# Patient Record
Sex: Male | Born: 1935 | Race: White | Hispanic: No | Smoking: Former smoker
Health system: Southern US, Community
[De-identification: ages and names within clinical notes are randomized; demographics above are authoritative.]

## PROBLEM LIST (undated history)

## (undated) DIAGNOSIS — E785 Hyperlipidemia, unspecified: Secondary | ICD-10-CM

## (undated) DIAGNOSIS — I714 Abdominal aortic aneurysm, without rupture, unspecified: Secondary | ICD-10-CM

## (undated) DIAGNOSIS — I509 Heart failure, unspecified: Secondary | ICD-10-CM

## (undated) DIAGNOSIS — F1910 Other psychoactive substance abuse, uncomplicated: Secondary | ICD-10-CM

## (undated) DIAGNOSIS — E119 Type 2 diabetes mellitus without complications: Secondary | ICD-10-CM

## (undated) DIAGNOSIS — I219 Acute myocardial infarction, unspecified: Secondary | ICD-10-CM

## (undated) DIAGNOSIS — Z8719 Personal history of other diseases of the digestive system: Secondary | ICD-10-CM

## (undated) DIAGNOSIS — J449 Chronic obstructive pulmonary disease, unspecified: Secondary | ICD-10-CM

## (undated) DIAGNOSIS — I2510 Atherosclerotic heart disease of native coronary artery without angina pectoris: Secondary | ICD-10-CM

## (undated) DIAGNOSIS — C3410 Malignant neoplasm of upper lobe, unspecified bronchus or lung: Secondary | ICD-10-CM

## (undated) DIAGNOSIS — C8590 Non-Hodgkin lymphoma, unspecified, unspecified site: Secondary | ICD-10-CM

## (undated) DIAGNOSIS — I10 Essential (primary) hypertension: Secondary | ICD-10-CM

## (undated) DIAGNOSIS — G629 Polyneuropathy, unspecified: Secondary | ICD-10-CM

## (undated) DIAGNOSIS — I739 Peripheral vascular disease, unspecified: Secondary | ICD-10-CM

## (undated) HISTORY — DX: Essential (primary) hypertension: I10

## (undated) HISTORY — DX: Type 2 diabetes mellitus without complications: E11.9

## (undated) HISTORY — DX: Hyperlipidemia, unspecified: E78.5

## (undated) HISTORY — PX: OTHER SURGICAL HISTORY: SHX169

## (undated) HISTORY — DX: Other psychoactive substance abuse, uncomplicated: F19.10

## (undated) HISTORY — DX: Polyneuropathy, unspecified: G62.9

## (undated) HISTORY — DX: Non-Hodgkin lymphoma, unspecified, unspecified site: C85.90

## (undated) HISTORY — DX: Peripheral vascular disease, unspecified: I73.9

## (undated) HISTORY — DX: Atherosclerotic heart disease of native coronary artery without angina pectoris: I25.10

## (undated) HISTORY — DX: Chronic obstructive pulmonary disease, unspecified: J44.9

## (undated) HISTORY — DX: Personal history of other diseases of the digestive system: Z87.19

---

## 1989-06-28 HISTORY — PX: CARDIAC CATHETERIZATION: SHX172

## 1999-05-01 ENCOUNTER — Inpatient Hospital Stay (HOSPITAL_COMMUNITY): Admission: EM | Admit: 1999-05-01 | Discharge: 1999-05-04 | Payer: Self-pay | Admitting: Emergency Medicine

## 1999-07-04 ENCOUNTER — Ambulatory Visit (HOSPITAL_COMMUNITY): Admission: RE | Admit: 1999-07-04 | Discharge: 1999-07-04 | Payer: Self-pay | Admitting: *Deleted

## 1999-07-25 ENCOUNTER — Ambulatory Visit (HOSPITAL_COMMUNITY): Admission: RE | Admit: 1999-07-25 | Discharge: 1999-07-25 | Payer: Self-pay | Admitting: Oncology

## 1999-07-25 ENCOUNTER — Encounter: Payer: Self-pay | Admitting: Oncology

## 1999-10-26 ENCOUNTER — Encounter: Payer: Self-pay | Admitting: Oncology

## 1999-10-26 ENCOUNTER — Ambulatory Visit (HOSPITAL_COMMUNITY): Admission: RE | Admit: 1999-10-26 | Discharge: 1999-10-26 | Payer: Self-pay | Admitting: Oncology

## 1999-12-17 ENCOUNTER — Encounter: Payer: Self-pay | Admitting: Oncology

## 1999-12-17 ENCOUNTER — Encounter: Admission: RE | Admit: 1999-12-17 | Discharge: 1999-12-17 | Payer: Self-pay | Admitting: Oncology

## 2000-04-01 ENCOUNTER — Encounter: Payer: Self-pay | Admitting: Oncology

## 2000-04-01 ENCOUNTER — Encounter: Admission: RE | Admit: 2000-04-01 | Discharge: 2000-04-01 | Payer: Self-pay | Admitting: Oncology

## 2002-12-06 ENCOUNTER — Encounter: Payer: Self-pay | Admitting: Emergency Medicine

## 2002-12-06 ENCOUNTER — Emergency Department (HOSPITAL_COMMUNITY): Admission: EM | Admit: 2002-12-06 | Discharge: 2002-12-06 | Payer: Self-pay | Admitting: Emergency Medicine

## 2003-10-29 HISTORY — PX: PR VEIN BYPASS GRAFT,AORTO-FEM-POP: 35551

## 2003-12-12 ENCOUNTER — Encounter: Payer: Self-pay | Admitting: Gastroenterology

## 2004-01-05 ENCOUNTER — Ambulatory Visit (HOSPITAL_COMMUNITY): Admission: RE | Admit: 2004-01-05 | Discharge: 2004-01-05 | Payer: Self-pay | Admitting: Vascular Surgery

## 2004-01-09 ENCOUNTER — Inpatient Hospital Stay (HOSPITAL_COMMUNITY): Admission: RE | Admit: 2004-01-09 | Discharge: 2004-01-13 | Payer: Self-pay | Admitting: Vascular Surgery

## 2004-02-27 ENCOUNTER — Emergency Department (HOSPITAL_COMMUNITY): Admission: EM | Admit: 2004-02-27 | Discharge: 2004-02-27 | Payer: Self-pay

## 2004-07-25 ENCOUNTER — Encounter: Payer: Self-pay | Admitting: Vascular Surgery

## 2004-07-26 ENCOUNTER — Ambulatory Visit (HOSPITAL_COMMUNITY): Admission: RE | Admit: 2004-07-26 | Discharge: 2004-07-26 | Payer: Self-pay | Admitting: Vascular Surgery

## 2005-01-27 HISTORY — PX: PR VEIN BYPASS GRAFT,AORTO-FEM-POP: 35551

## 2005-02-14 ENCOUNTER — Inpatient Hospital Stay (HOSPITAL_COMMUNITY): Admission: RE | Admit: 2005-02-14 | Discharge: 2005-02-14 | Payer: Self-pay | Admitting: Vascular Surgery

## 2005-02-15 ENCOUNTER — Ambulatory Visit (HOSPITAL_COMMUNITY): Admission: RE | Admit: 2005-02-15 | Discharge: 2005-02-18 | Payer: Self-pay | Admitting: Vascular Surgery

## 2005-02-15 ENCOUNTER — Ambulatory Visit: Payer: Self-pay | Admitting: Oncology

## 2005-12-13 ENCOUNTER — Ambulatory Visit: Payer: Self-pay | Admitting: Oncology

## 2006-09-11 ENCOUNTER — Ambulatory Visit: Payer: Self-pay | Admitting: Oncology

## 2007-02-24 ENCOUNTER — Ambulatory Visit: Payer: Self-pay | Admitting: Vascular Surgery

## 2007-07-02 ENCOUNTER — Ambulatory Visit: Payer: Self-pay | Admitting: Oncology

## 2007-10-01 ENCOUNTER — Ambulatory Visit: Payer: Self-pay | Admitting: Oncology

## 2007-10-29 HISTORY — PX: FRACTURE SURGERY: SHX138

## 2007-12-19 ENCOUNTER — Inpatient Hospital Stay (HOSPITAL_COMMUNITY): Admission: EM | Admit: 2007-12-19 | Discharge: 2007-12-22 | Payer: Self-pay | Admitting: Emergency Medicine

## 2008-01-29 ENCOUNTER — Ambulatory Visit: Payer: Self-pay | Admitting: Vascular Surgery

## 2008-03-31 ENCOUNTER — Ambulatory Visit: Payer: Self-pay | Admitting: Oncology

## 2008-07-29 ENCOUNTER — Ambulatory Visit: Payer: Self-pay | Admitting: Vascular Surgery

## 2008-11-07 ENCOUNTER — Encounter: Admission: RE | Admit: 2008-11-07 | Discharge: 2008-11-07 | Payer: Self-pay | Admitting: Internal Medicine

## 2008-11-08 ENCOUNTER — Encounter: Admission: RE | Admit: 2008-11-08 | Discharge: 2008-11-08 | Payer: Self-pay | Admitting: Internal Medicine

## 2008-11-11 ENCOUNTER — Ambulatory Visit: Payer: Self-pay | Admitting: Oncology

## 2008-11-22 ENCOUNTER — Ambulatory Visit (HOSPITAL_COMMUNITY): Admission: RE | Admit: 2008-11-22 | Discharge: 2008-11-22 | Payer: Self-pay | Admitting: Oncology

## 2008-11-28 ENCOUNTER — Ambulatory Visit (HOSPITAL_COMMUNITY): Admission: RE | Admit: 2008-11-28 | Discharge: 2008-11-28 | Payer: Self-pay | Admitting: Oncology

## 2008-11-29 ENCOUNTER — Encounter: Payer: Self-pay | Admitting: Oncology

## 2008-11-29 ENCOUNTER — Ambulatory Visit: Admission: RE | Admit: 2008-11-29 | Discharge: 2008-11-29 | Payer: Self-pay | Admitting: Oncology

## 2008-11-29 LAB — CBC WITH DIFFERENTIAL/PLATELET
Basophils Absolute: 0.1 10*3/uL (ref 0.0–0.1)
EOS%: 0.5 % (ref 0.0–7.0)
LYMPH%: 18.8 % (ref 14.0–48.0)
MCH: 28 pg (ref 28.0–33.4)
MCV: 84.6 fL (ref 81.6–98.0)
MONO%: 10.6 % (ref 0.0–13.0)
Platelets: UNDETERMINED 10*3/uL (ref 145–400)
RBC: 4.07 10*6/uL — ABNORMAL LOW (ref 4.20–5.71)
RDW: 15.4 % — ABNORMAL HIGH (ref 11.2–14.6)

## 2008-11-29 LAB — LACTATE DEHYDROGENASE: LDH: 239 U/L (ref 94–250)

## 2008-11-29 LAB — COMPREHENSIVE METABOLIC PANEL
Alkaline Phosphatase: 42 U/L (ref 39–117)
Creatinine, Ser: 1.08 mg/dL (ref 0.40–1.50)
Glucose, Bld: 307 mg/dL — ABNORMAL HIGH (ref 70–99)
Sodium: 136 mEq/L (ref 135–145)
Total Bilirubin: 0.5 mg/dL (ref 0.3–1.2)
Total Protein: 7.1 g/dL (ref 6.0–8.3)

## 2008-11-29 LAB — URIC ACID: Uric Acid, Serum: 3.2 mg/dL — ABNORMAL LOW (ref 4.0–7.8)

## 2008-11-29 LAB — TECHNOLOGIST REVIEW

## 2008-12-02 LAB — CBC WITH DIFFERENTIAL/PLATELET
Basophils Absolute: 0 10*3/uL (ref 0.0–0.1)
Eosinophils Absolute: 0.1 10*3/uL (ref 0.0–0.5)
HCT: 33.7 % — ABNORMAL LOW (ref 38.7–49.9)
HGB: 11.2 g/dL — ABNORMAL LOW (ref 13.0–17.1)
MCH: 28.1 pg (ref 28.0–33.4)
MONO#: 0.7 10*3/uL (ref 0.1–0.9)
NEUT#: 5.1 10*3/uL (ref 1.5–6.5)
NEUT%: 74.8 % (ref 40.0–75.0)
RDW: 14.8 % — ABNORMAL HIGH (ref 11.2–14.6)
WBC: 6.8 10*3/uL (ref 4.0–10.0)
lymph#: 1 10*3/uL (ref 0.9–3.3)

## 2008-12-13 LAB — CBC WITH DIFFERENTIAL/PLATELET
Basophils Absolute: 0 10*3/uL (ref 0.0–0.1)
Eosinophils Absolute: 0 10*3/uL (ref 0.0–0.5)
HGB: 10.3 g/dL — ABNORMAL LOW (ref 13.0–17.1)
MCV: 84.4 fL (ref 81.6–98.0)
MONO#: 0.2 10*3/uL (ref 0.1–0.9)
NEUT#: 0.4 10*3/uL — CL (ref 1.5–6.5)
RBC: 3.66 10*6/uL — ABNORMAL LOW (ref 4.20–5.71)
RDW: 14.9 % — ABNORMAL HIGH (ref 11.2–14.6)
WBC: 1.3 10*3/uL — ABNORMAL LOW (ref 4.0–10.0)

## 2008-12-16 LAB — TECHNOLOGIST REVIEW

## 2008-12-16 LAB — CBC WITH DIFFERENTIAL/PLATELET
Basophils Absolute: 0 10*3/uL (ref 0.0–0.1)
Eosinophils Absolute: 0 10*3/uL (ref 0.0–0.5)
HCT: 31.7 % — ABNORMAL LOW (ref 38.4–49.9)
HGB: 10.6 g/dL — ABNORMAL LOW (ref 13.0–17.1)
LYMPH%: 53.1 % — ABNORMAL HIGH (ref 14.0–49.0)
MCV: 85.1 fL (ref 79.3–98.0)
MONO#: 0.3 10*3/uL (ref 0.1–0.9)
MONO%: 26.6 % — ABNORMAL HIGH (ref 0.0–14.0)
NEUT#: 0.2 10*3/uL — CL (ref 1.5–6.5)
NEUT%: 16.2 % — ABNORMAL LOW (ref 39.0–75.0)
Platelets: 143 10*3/uL (ref 140–400)
WBC: 1.3 10*3/uL — ABNORMAL LOW (ref 4.0–10.3)

## 2008-12-23 LAB — CBC WITH DIFFERENTIAL/PLATELET
Basophils Absolute: 0.1 10*3/uL (ref 0.0–0.1)
EOS%: 1.6 % (ref 0.0–7.0)
HCT: 35.9 % — ABNORMAL LOW (ref 38.4–49.9)
HGB: 11.6 g/dL — ABNORMAL LOW (ref 13.0–17.1)
LYMPH%: 25.1 % (ref 14.0–49.0)
MCH: 27.2 pg (ref 27.2–33.4)
NEUT%: 40.3 % (ref 39.0–75.0)
Platelets: 214 10*3/uL (ref 140–400)
lymph#: 0.9 10*3/uL (ref 0.9–3.3)

## 2008-12-23 LAB — COMPREHENSIVE METABOLIC PANEL
Albumin: 3.6 g/dL (ref 3.5–5.2)
Alkaline Phosphatase: 36 U/L — ABNORMAL LOW (ref 39–117)
BUN: 10 mg/dL (ref 6–23)
Calcium: 9.3 mg/dL (ref 8.4–10.5)
Chloride: 104 mEq/L (ref 96–112)
Creatinine, Ser: 1.01 mg/dL (ref 0.40–1.50)
Glucose, Bld: 198 mg/dL — ABNORMAL HIGH (ref 70–99)
Potassium: 4 mEq/L (ref 3.5–5.3)

## 2008-12-30 ENCOUNTER — Ambulatory Visit: Payer: Self-pay | Admitting: Oncology

## 2009-01-03 LAB — CBC WITH DIFFERENTIAL/PLATELET
EOS%: 0.2 % (ref 0.0–7.0)
MCH: 28.2 pg (ref 27.2–33.4)
MCV: 85.5 fL (ref 79.3–98.0)
MONO%: 3.8 % (ref 0.0–14.0)
RBC: 3.38 10*6/uL — ABNORMAL LOW (ref 4.20–5.82)
RDW: 16.8 % — ABNORMAL HIGH (ref 11.0–14.6)

## 2009-01-13 ENCOUNTER — Ambulatory Visit: Admission: RE | Admit: 2009-01-13 | Discharge: 2009-01-13 | Payer: Self-pay | Admitting: Oncology

## 2009-01-13 ENCOUNTER — Encounter: Payer: Self-pay | Admitting: Oncology

## 2009-01-13 ENCOUNTER — Ambulatory Visit: Payer: Self-pay | Admitting: Vascular Surgery

## 2009-01-13 LAB — CBC WITH DIFFERENTIAL/PLATELET
Basophils Absolute: 0.1 10*3/uL (ref 0.0–0.1)
EOS%: 0.3 % (ref 0.0–7.0)
HGB: 10.4 g/dL — ABNORMAL LOW (ref 13.0–17.1)
LYMPH%: 17.5 % (ref 14.0–49.0)
MCH: 27.3 pg (ref 27.2–33.4)
MCV: 85.8 fL (ref 79.3–98.0)
MONO%: 21.9 % — ABNORMAL HIGH (ref 0.0–14.0)
RDW: 17.6 % — ABNORMAL HIGH (ref 11.0–14.6)

## 2009-01-13 LAB — COMPREHENSIVE METABOLIC PANEL
AST: 27 U/L (ref 0–37)
Albumin: 3.4 g/dL — ABNORMAL LOW (ref 3.5–5.2)
Alkaline Phosphatase: 40 U/L (ref 39–117)
Potassium: 4 mEq/L (ref 3.5–5.3)
Sodium: 138 mEq/L (ref 135–145)
Total Protein: 6 g/dL (ref 6.0–8.3)

## 2009-02-03 LAB — CBC WITH DIFFERENTIAL/PLATELET
Basophils Absolute: 0.1 10*3/uL (ref 0.0–0.1)
EOS%: 0.6 % (ref 0.0–7.0)
HGB: 10.1 g/dL — ABNORMAL LOW (ref 13.0–17.1)
MCH: 28.5 pg (ref 27.2–33.4)
NEUT#: 2.9 10*3/uL (ref 1.5–6.5)
RDW: 19 % — ABNORMAL HIGH (ref 11.0–14.6)
WBC: 5 10*3/uL (ref 4.0–10.3)
lymph#: 1.2 10*3/uL (ref 0.9–3.3)

## 2009-02-03 LAB — COMPREHENSIVE METABOLIC PANEL
AST: 28 U/L (ref 0–37)
Albumin: 3.5 g/dL (ref 3.5–5.2)
BUN: 14 mg/dL (ref 6–23)
CO2: 26 mEq/L (ref 19–32)
Calcium: 9.8 mg/dL (ref 8.4–10.5)
Chloride: 108 mEq/L (ref 96–112)
Glucose, Bld: 145 mg/dL — ABNORMAL HIGH (ref 70–99)
Potassium: 4.2 mEq/L (ref 3.5–5.3)

## 2009-02-06 ENCOUNTER — Ambulatory Visit: Payer: Self-pay | Admitting: Vascular Surgery

## 2009-02-20 ENCOUNTER — Ambulatory Visit (HOSPITAL_COMMUNITY): Admission: RE | Admit: 2009-02-20 | Discharge: 2009-02-20 | Payer: Self-pay | Admitting: Oncology

## 2009-02-21 ENCOUNTER — Ambulatory Visit: Payer: Self-pay | Admitting: Oncology

## 2009-02-23 LAB — COMPREHENSIVE METABOLIC PANEL
AST: 26 U/L (ref 0–37)
Albumin: 3.5 g/dL (ref 3.5–5.2)
Alkaline Phosphatase: 50 U/L (ref 39–117)
Glucose, Bld: 163 mg/dL — ABNORMAL HIGH (ref 70–99)
Potassium: 4.1 mEq/L (ref 3.5–5.3)
Sodium: 136 mEq/L (ref 135–145)
Total Protein: 6.1 g/dL (ref 6.0–8.3)

## 2009-02-23 LAB — CBC WITH DIFFERENTIAL/PLATELET
EOS%: 0.8 % (ref 0.0–7.0)
Eosinophils Absolute: 0 10*3/uL (ref 0.0–0.5)
MCV: 89.6 fL (ref 79.3–98.0)
MONO%: 10.6 % (ref 0.0–14.0)
NEUT#: 3.6 10*3/uL (ref 1.5–6.5)
RBC: 3.4 10*6/uL — ABNORMAL LOW (ref 4.20–5.82)
RDW: 20.2 % — ABNORMAL HIGH (ref 11.0–14.6)
lymph#: 1.4 10*3/uL (ref 0.9–3.3)

## 2009-03-17 LAB — CBC WITH DIFFERENTIAL/PLATELET
Basophils Absolute: 0.1 10*3/uL (ref 0.0–0.1)
Eosinophils Absolute: 0.1 10*3/uL (ref 0.0–0.5)
HCT: 33.6 % — ABNORMAL LOW (ref 38.4–49.9)
HGB: 10.9 g/dL — ABNORMAL LOW (ref 13.0–17.1)
MONO#: 1.1 10*3/uL — ABNORMAL HIGH (ref 0.1–0.9)
NEUT%: 33.4 % — ABNORMAL LOW (ref 39.0–75.0)
Platelets: 166 10*3/uL (ref 140–400)
WBC: 5.4 10*3/uL (ref 4.0–10.3)
lymph#: 2.3 10*3/uL (ref 0.9–3.3)

## 2009-04-07 ENCOUNTER — Ambulatory Visit: Payer: Self-pay | Admitting: Oncology

## 2009-04-07 LAB — CBC WITH DIFFERENTIAL/PLATELET
BASO%: 0.9 % (ref 0.0–2.0)
Eosinophils Absolute: 0.1 10*3/uL (ref 0.0–0.5)
HCT: 30.7 % — ABNORMAL LOW (ref 38.4–49.9)
MCHC: 34.7 g/dL (ref 32.0–36.0)
MONO#: 0.8 10*3/uL (ref 0.1–0.9)
NEUT#: 3 10*3/uL (ref 1.5–6.5)
NEUT%: 61 % (ref 39.0–75.0)
RBC: 3.44 10*6/uL — ABNORMAL LOW (ref 4.20–5.82)
WBC: 4.9 10*3/uL (ref 4.0–10.3)
lymph#: 1 10*3/uL (ref 0.9–3.3)

## 2009-06-06 ENCOUNTER — Ambulatory Visit: Payer: Self-pay | Admitting: Oncology

## 2009-06-08 LAB — CBC WITH DIFFERENTIAL/PLATELET
BASO%: 0.4 % (ref 0.0–2.0)
Eosinophils Absolute: 0.1 10*3/uL (ref 0.0–0.5)
LYMPH%: 34.8 % (ref 14.0–49.0)
MCHC: 32.8 g/dL (ref 32.0–36.0)
MONO#: 0.8 10*3/uL (ref 0.1–0.9)
NEUT#: 2.7 10*3/uL (ref 1.5–6.5)
Platelets: 180 10*3/uL (ref 140–400)
RBC: 4.15 10*6/uL — ABNORMAL LOW (ref 4.20–5.82)
RDW: 15.3 % — ABNORMAL HIGH (ref 11.0–14.6)
WBC: 5.6 10*3/uL (ref 4.0–10.3)

## 2009-07-19 DIAGNOSIS — Z860100 Personal history of colon polyps, unspecified: Secondary | ICD-10-CM | POA: Insufficient documentation

## 2009-07-19 DIAGNOSIS — I2510 Atherosclerotic heart disease of native coronary artery without angina pectoris: Secondary | ICD-10-CM

## 2009-07-19 DIAGNOSIS — E785 Hyperlipidemia, unspecified: Secondary | ICD-10-CM

## 2009-07-19 DIAGNOSIS — G589 Mononeuropathy, unspecified: Secondary | ICD-10-CM | POA: Insufficient documentation

## 2009-07-19 DIAGNOSIS — E1149 Type 2 diabetes mellitus with other diabetic neurological complication: Secondary | ICD-10-CM

## 2009-07-19 DIAGNOSIS — K5730 Diverticulosis of large intestine without perforation or abscess without bleeding: Secondary | ICD-10-CM | POA: Insufficient documentation

## 2009-07-19 DIAGNOSIS — K649 Unspecified hemorrhoids: Secondary | ICD-10-CM | POA: Insufficient documentation

## 2009-07-19 DIAGNOSIS — I10 Essential (primary) hypertension: Secondary | ICD-10-CM | POA: Insufficient documentation

## 2009-07-19 DIAGNOSIS — Z87898 Personal history of other specified conditions: Secondary | ICD-10-CM | POA: Insufficient documentation

## 2009-07-20 ENCOUNTER — Ambulatory Visit: Payer: Self-pay | Admitting: Gastroenterology

## 2009-08-02 ENCOUNTER — Encounter: Payer: Self-pay | Admitting: Gastroenterology

## 2009-08-02 ENCOUNTER — Ambulatory Visit: Payer: Self-pay | Admitting: Gastroenterology

## 2009-08-04 ENCOUNTER — Encounter: Payer: Self-pay | Admitting: Gastroenterology

## 2009-08-07 DIAGNOSIS — K5289 Other specified noninfective gastroenteritis and colitis: Secondary | ICD-10-CM | POA: Insufficient documentation

## 2009-08-08 ENCOUNTER — Ambulatory Visit: Payer: Self-pay | Admitting: Gastroenterology

## 2009-08-08 ENCOUNTER — Ambulatory Visit: Payer: Self-pay | Admitting: Vascular Surgery

## 2009-08-08 LAB — CONVERTED CEMR LAB
BUN: 18 mg/dL (ref 6–23)
Creatinine, Ser: 1.4 mg/dL (ref 0.4–1.5)

## 2009-08-10 ENCOUNTER — Ambulatory Visit: Payer: Self-pay | Admitting: Cardiovascular Disease

## 2009-08-14 DIAGNOSIS — K8020 Calculus of gallbladder without cholecystitis without obstruction: Secondary | ICD-10-CM | POA: Insufficient documentation

## 2009-08-15 ENCOUNTER — Ambulatory Visit: Payer: Self-pay | Admitting: Gastroenterology

## 2009-08-15 DIAGNOSIS — R197 Diarrhea, unspecified: Secondary | ICD-10-CM

## 2009-08-29 ENCOUNTER — Ambulatory Visit: Payer: Self-pay | Admitting: Oncology

## 2009-08-31 ENCOUNTER — Encounter: Payer: Self-pay | Admitting: Gastroenterology

## 2009-08-31 LAB — CBC WITH DIFFERENTIAL/PLATELET
BASO%: 0.4 % (ref 0.0–2.0)
Basophils Absolute: 0 10*3/uL (ref 0.0–0.1)
EOS%: 1.7 % (ref 0.0–7.0)
MCH: 29 pg (ref 27.2–33.4)
MCHC: 32.5 g/dL (ref 32.0–36.0)
MCV: 89.1 fL (ref 79.3–98.0)
MONO%: 13 % (ref 0.0–14.0)
RBC: 4.14 10*6/uL — ABNORMAL LOW (ref 4.20–5.82)
RDW: 15 % — ABNORMAL HIGH (ref 11.0–14.6)
lymph#: 1.7 10*3/uL (ref 0.9–3.3)
nRBC: 0 % (ref 0–0)

## 2009-12-27 ENCOUNTER — Ambulatory Visit: Payer: Self-pay | Admitting: Oncology

## 2009-12-29 ENCOUNTER — Encounter: Payer: Self-pay | Admitting: Gastroenterology

## 2009-12-29 LAB — CBC WITH DIFFERENTIAL/PLATELET
BASO%: 0.4 % (ref 0.0–2.0)
Eosinophils Absolute: 0.1 10*3/uL (ref 0.0–0.5)
HCT: 37.6 % — ABNORMAL LOW (ref 38.4–49.9)
LYMPH%: 31.9 % (ref 14.0–49.0)
MCHC: 33.2 g/dL (ref 32.0–36.0)
MONO#: 0.6 10*3/uL (ref 0.1–0.9)
NEUT#: 3.2 10*3/uL (ref 1.5–6.5)
NEUT%: 55.6 % (ref 39.0–75.0)
Platelets: 199 10*3/uL (ref 140–400)
RBC: 4.19 10*6/uL — ABNORMAL LOW (ref 4.20–5.82)
WBC: 5.7 10*3/uL (ref 4.0–10.3)
lymph#: 1.8 10*3/uL (ref 0.9–3.3)

## 2010-02-09 ENCOUNTER — Ambulatory Visit: Payer: Self-pay | Admitting: Vascular Surgery

## 2010-04-25 ENCOUNTER — Ambulatory Visit: Payer: Self-pay | Admitting: Oncology

## 2010-04-27 ENCOUNTER — Encounter: Payer: Self-pay | Admitting: Gastroenterology

## 2010-04-27 LAB — CBC WITH DIFFERENTIAL/PLATELET
BASO%: 0.3 % (ref 0.0–2.0)
Eosinophils Absolute: 0.1 10*3/uL (ref 0.0–0.5)
MCHC: 33.3 g/dL (ref 32.0–36.0)
MONO#: 0.7 10*3/uL (ref 0.1–0.9)
NEUT#: 4.4 10*3/uL (ref 1.5–6.5)
RBC: 4.03 10*6/uL — ABNORMAL LOW (ref 4.20–5.82)
RDW: 14.5 % (ref 11.0–14.6)
WBC: 7 10*3/uL (ref 4.0–10.3)
lymph#: 1.8 10*3/uL (ref 0.9–3.3)

## 2010-08-24 ENCOUNTER — Ambulatory Visit: Payer: Self-pay | Admitting: Oncology

## 2010-08-28 ENCOUNTER — Encounter: Payer: Self-pay | Admitting: Gastroenterology

## 2010-09-14 ENCOUNTER — Ambulatory Visit: Payer: Self-pay | Admitting: Vascular Surgery

## 2010-11-18 ENCOUNTER — Encounter: Payer: Self-pay | Admitting: Oncology

## 2010-11-27 NOTE — Letter (Signed)
[facility]   Imported By: Sherian Rein 01/19/2010 07:52:43  _____________________________________________________________________  External Attachment:    Type:   Image     Comment:   External Document

## 2010-11-27 NOTE — Letter (Signed)
Summary: [facility]  [facility]   Imported By: Sherian Rein 09/07/2010 08:02:03  _____________________________________________________________________  External Attachment:    Type:   Image     Comment:   External Document

## 2010-11-27 NOTE — Letter (Signed)
[facility]   Imported By: Lennie Odor 05/18/2010 12:10:19  _____________________________________________________________________  External Attachment:    Type:   Image     Comment:   External Document

## 2011-01-31 LAB — GLUCOSE, CAPILLARY: Glucose-Capillary: 153 mg/dL — ABNORMAL HIGH (ref 70–99)

## 2011-02-06 LAB — GLUCOSE, CAPILLARY: Glucose-Capillary: 107 mg/dL — ABNORMAL HIGH (ref 70–99)

## 2011-02-12 LAB — GLUCOSE, CAPILLARY: Glucose-Capillary: 219 mg/dL — ABNORMAL HIGH (ref 70–99)

## 2011-02-28 ENCOUNTER — Encounter (HOSPITAL_BASED_OUTPATIENT_CLINIC_OR_DEPARTMENT_OTHER): Payer: Medicare Other | Admitting: Oncology

## 2011-02-28 DIAGNOSIS — C8583 Other specified types of non-Hodgkin lymphoma, intra-abdominal lymph nodes: Secondary | ICD-10-CM

## 2011-03-12 NOTE — Procedures (Signed)
BYPASS GRAFT EVALUATION   INDICATION:  Followup of right fem-pop bypass graft.   HISTORY:  Diabetes:  Yes.  Cardiac:  No.  Hypertension:  No.  Smoking:  Quit.  Previous Surgery:  Right fem-pop bypass graft 02/15/2005 by Dr. Hart Rochester.   SINGLE LEVEL ARTERIAL EXAM                               RIGHT              LEFT  Brachial:                    137                137  Anterior tibial:             140                80  Posterior tibial:            168                91  Peroneal:  Ankle/brachial index:        1.23               0.66 (0.58 DPA)   PREVIOUS ABI:  Date:  02/06/2009  RIGHT:  1.15  LEFT:  0.76   LOWER EXTREMITY BYPASS GRAFT DUPLEX EXAM:   DUPLEX:  Doppler arterial waveforms are triphasic proximal to,  throughout and distal to the fem-pop bypass graft.   IMPRESSION:  1. Ankle brachial index on the right is within normal limits.  2. Ankle brachial index on the left suggests moderate disease.           ___________________________________________  Quita Skye. Hart Rochester, M.D.   CJ/MEDQ  D:  08/08/2009  T:  08/09/2009  Job:  [id number]

## 2011-03-12 NOTE — Procedures (Signed)
BYPASS GRAFT EVALUATION   INDICATION:  Followup right lower extremity bypass graft.   HISTORY:  Diabetes:  Yes.  Cardiac:  No.  Hypertension:  No.  Smoking:  Previous.  Previous Surgery:  Right fem-pop bypass graft on 02/15/2005.   SINGLE LEVEL ARTERIAL EXAM                               RIGHT              LEFT  Brachial:                    123                119  Anterior tibial:             113                77  Posterior tibial:            123                86  Peroneal:  Ankle/brachial index:        1.0                0.7   PREVIOUS ABI:  Date:  01/29/2008  RIGHT:  1.0  LEFT:  0.82 (02/24/2007)   LOWER EXTREMITY BYPASS GRAFT DUPLEX EXAM:   DUPLEX:  Biphasic Doppler waveforms noted throughout the right lower  extremity bypass graft and its native vessels with no increase in  Doppler velocities noted.   IMPRESSION:  1. Patent right lower extremity bypass graft with no evidence of      stenosis noted.  2. Stable bilateral ankle brachial indices noted.   ___________________________________________  Quita Skye Hart Rochester, M.D.   CH/MEDQ  D:  07/29/2008  T:  07/30/2008  Job:  [id number]

## 2011-03-12 NOTE — Procedures (Signed)
BYPASS GRAFT EVALUATION   INDICATION:  Followup from right femoral to popliteal bypass graft.   HISTORY:  Diabetes:  Yes.  Cardiac:  No.  Hypertension:  No.  Smoking:  Quit.  Previous Surgery:  Right femoral to popliteal bypass graft 01/27/2005 by  Dr. Hart Rochester.   SINGLE LEVEL ARTERIAL EXAM                               RIGHT              LEFT  Brachial:                    128                135  Anterior tibial:             133                83  Posterior tibial:            154                99  Peroneal:  Ankle/brachial index:        1.14               0.73   PREVIOUS ABI:  Date:  08/08/2009  RIGHT:  1.14  LEFT:  0.68   LOWER EXTREMITY BYPASS GRAFT DUPLEX EXAM:   DUPLEX:  Patent right femoral-popliteal bypass graft with triphasic and  biphasic waveforms.  No evidence of increased velocities.   IMPRESSION:  Stable ankle brachial indices.  Patent right femoral-  popliteal bypass graft with triphasic and biphasic waveforms with no  evidence of focal stenosis.   ___________________________________________  Larina Earthly, M.D.   OD/MEDQ  D:  09/17/2010  T:  09/17/2010  Job:  [id number]

## 2011-03-12 NOTE — Procedures (Signed)
BYPASS GRAFT EVALUATION   INDICATION:  Follow up bypass graft.   HISTORY:  Diabetes:  Yes.  Cardiac:  No.  Hypertension:  No.  Smoking:  Quit 14 years ago.  Previous Surgery:  Right femoral-to-popliteal artery bypass graft with  reverse saphenous vein interposition graft on 02/15/2005 by Dr. Hart Rochester.   SINGLE LEVEL ARTERIAL EXAM                               RIGHT              LEFT  Brachial:                    120                110  Anterior tibial:             98  Posterior tibial:            122  Peroneal:  Ankle/brachial index:        >1.0   PREVIOUS ABI:  Date: 02/24/2007  RIGHT:  >1.0  LEFT:  0.82   LOWER EXTREMITY BYPASS GRAFT DUPLEX EXAM:   DUPLEX:  Doppler arterial waveforms are biphasic proximal to, throughout  and distal to the graft.  Velocities are within normal limits.   IMPRESSION:  1. Patent right femoral-to-popliteal artery bypass graft.  2. Right ABI was within normal limits.  3. Unable to tolerate left leg pressures due to injury.   ___________________________________________  Quita Skye Hart Rochester, M.D.   DP/MEDQ  D:  01/29/2008  T:  01/29/2008  Job:  [id number]

## 2011-03-12 NOTE — Op Note (Signed)
NAMEJEHIEL, KOEPP NO.:  [id number]   MEDICAL RECORD NO.:  [id number]          PATIENT TYPE:  INP   LOCATION:  0108                         FACILITY:  [facility]   PHYSICIAN:  Jene Every, M.D.    DATE OF BIRTH:  15-Nov-1932   DATE OF PROCEDURE:  12/19/2007  DATE OF DISCHARGE:                               OPERATIVE REPORT   PREOPERATIVE DIAGNOSIS:  Bimalleolar left ankle fracture.   POSTOPERATIVE DIAGNOSIS:  Bimalleolar left ankle fracture.   PROCEDURE PERFORMED:  1. Open reduction and internal fixation, left ankle.  2. Stress radiographs.  3. I&D and excision of abrasion and partial repair.   ANESTHESIA:  General.   SURGEON:  Jene Every, M.D.   ASSISTANT:  Roma Schanz, P.A.   BRIEF HISTORY AND INDICATIONS:  This 77 year old sustained the above-  mentioned fracture and is indicated for open reduction internal  fixation.  He had a small abrasion without any drainage over the wound.  He had a more of an anterior abrasion and was thought not to be an open  fracture.  Risks and benefits were discussed including infection, damage  to normal vascular structures, nonunion, malunion revision, DVT, PE and  anesthetic complications, etc.   TECHNIQUE:  The patient was placed in supine position and after adequate  induction of general anesthesia, 500 mg of vancomycin and 1 g of Kefzol.  As I prepped and draped him, we removed an eschar and there was just a  thin hole of bleeding which was somewhat more anterior to the fibular  fracture, not over the fracture site.  We prepped and draped and  exsanguinated with a thigh tourniquet at 300 mmHg.  The small abrasion  area was actually a stretch injury to the skin with a significant  attenuation.  I made an incision over that and had debrided the edges.  Beneath that was a hematoma.  I copiously irrigated that with antibiotic  irrigation.  There was not any gross contamination or exposed bone.  I  then made a  curvilinear incision over the medial malleolus.  Subcutaneous tissue was dissected.  Electrical cardioversion was used to  achieve hemostasis.  I then identified the fracture site, removed  periosteum, curetted it and reduced it with a tenaculum anatomically on  the x-ray and placed 2 cannulated screws that were 4 mm in length,  partially threaded, with excellent purchase and expression of fracture  hematoma and anatomic reduction on x-ray.  These were placed in  parallel.  I then made an incision over the fibula.  Subcutaneous tissue  was dissected and electrocautery was utilized to achieve hemostasis.  The superficial branch of the peroneal nerve was identified.  The  fracture site again was not still encased in the periosteum; it was away  from the area of the abrasion.  I looked at a DCP plate, but was unable  to adequately contour that, so I felt stacking third tubular plates  would be the best construct, given his additional stabilization.  These  2 8-hole plates were contoured on the lateral aspect of the fibula and  fracture reduced on the radiographs and secured proximally with 4 fully  threaded cortical screws and distally with 2 cortical screws and 2  cancellous screws.  At the appropriate turn-depth gauge measurement of  insertion, screws had excellent purchase and anatomic reduction in the  AP and lateral plane.  The syndesmosis was intact.  There was no  instability of the syndesmosis on manipulation of the fibula.  Stress  radiographs were obtained, indicating maintenance of the mortise.  Both  wounds were copiously irrigated.  I closed the elliptical abrasion  incision with 4-0 nylon vertical mattress sutures, the lateral fibula  wound with 2-0 Vicryl and staples, medially with the 2-0 Vicryl and  staples.  The tourniquet was deflated and there was adequate  revascularization of the lower extremity appreciated.  A Jones-type  stirrup dressing was then applied.  The patient  was then transported to  the recovery room in satisfactory condition.  The patient tolerated the  procedure well with no complications.      Jene Every, M.D.  Electronically Signed     JB/MEDQ  D:  12/19/2007  T:  12/21/2007  Job:  [id number]

## 2011-03-12 NOTE — Procedures (Signed)
BYPASS GRAFT EVALUATION   INDICATION:  Follow up right fem-pop bypass graft.   HISTORY:  Diabetes:  Yes.  Cardiac:  No.  Hypertension:  No.  Smoking:  Quit.  Previous Surgery:  Right fem-pop bypass graft on 02/15/05 by Dr. Hart Rochester.   SINGLE LEVEL ARTERIAL EXAM                               RIGHT              LEFT  Brachial:                    105                114  Anterior tibial:             111                71  Posterior tibial:            130                78  Peroneal:  Ankle/brachial index:        1.14               0.68   PREVIOUS ABI:  Date: 08/08/09  RIGHT:  1.23  LEFT:  0.66   LOWER EXTREMITY BYPASS GRAFT DUPLEX EXAM:   DUPLEX:  Patent right femoropopliteal bypass graft appears with biphasic  waveforms proximal, mid, and distal to the graft.   IMPRESSION:  1. Stable ankle brachial indices bilaterally.  2. Patent femoropopliteal bypass graft with no evidence of focal      stenosis.   ___________________________________________  Quita Skye Hart Rochester, M.D.   CB/MEDQ  D:  02/09/2010  T:  02/09/2010  Job:  [id number]

## 2011-03-12 NOTE — Assessment & Plan Note (Signed)
OFFICE VISIT   [name] [name], [name] [name]  DOB:  02/15/1933                                       02/09/2010  JXBJY#:[id number]   REASON FOR VISIT:  A 1-month followup, status post right femoral-  popliteal bypass graft.   Patient is a 77 year old Caucasian male who is status post a right  common femoral artery to popliteal bypass with vein in March 2005.  There was noted to be vein graft stenosis in September 2005, and he  underwent percutaneous angioplasty.  However, he later required revision  of his bypass 02/15/2005 for recurrent vein graft stenosis.  At that  time, Dr. Hart Rochester replaced the lower portion of the vein graft with an  interposition reverse saphenous vein into the below-knee popliteal  artery.  The patient has been doing well since.  He was actually last  seen by Dr. Hart Rochester in May 2006.  At that time ABI had increased from  0.66 to 1.  He has been followed approximately every 6 months since with  continued ABI of greater than 1 on the right.  He continues to deny  ulcerations in his feet or rest pain or claudication symptoms.  He does  get some bilateral lower extremity achiness, but this is better with a  compression stocking and elevation.  He also gets an occasional cramping  in his right leg but describes this as more like a charley horse.   His past medical history is significant for coronary artery disease,  hypertension, dyslipidemia, diabetes mellitus type 2, non-Hodgkin's  lymphoma diagnosed in 2000 but a relapse in May 2010.  This is being  followed by Dr. Truett Perna every 6 months.  He did have chemotherapy.  He  also has some peripheral neuropathy with chronic numbness in his  bilateral toes.  He has also had colon polyps and a left ankle fracture  in 2009.   His medications include Humalog insulin 7/25, prostate complex vitamin,  calcium, Tylenol, Crestor, lisinopril, fenofibrate, Actos, Diltiazem,  tamsulosin, finasteride, metformin,  gabapentin, and fish oil.  He  reports he has not been taking his aspirin because he thought this was  the same as Tylenol.   FAMILY HISTORY:  He denies any heart or vascular disease in first degree  relatives that occurred at less than 65 years old.   SOCIAL HISTORY:  He reports that he is single with 2 children.  He is  retired.  He quit smoking in 2005 and does not drink alcohol on a  regular basis.   REVIEW OF SYSTEMS:  Positive for a 30-pound weight gain over the last  year.  He reports shortness of breath with exertion and history of a  heart murmur.  Denies chest pain.  Does get occasional diarrhea and does  have trouble with urinating frequently.  He denies any dizziness,  anxiety, depression, or bleeding disorders.  He does report some joint  and muscle pain and changes in his eyesight.   Physical exam findings show a heart rate of 71, blood pressure 122/71,  respirations 20.  General Appearance:  This is a well-nourished, well-  developed male in no acute distress.  He is overweight.  His head is  normocephalic atraumatic.  He does wear glasses.  His lung sounds are  clear throughout.  His heart has a regular rate and rhythm.  I  did not  auscultate any carotid bruits.  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  His abdomen is soft, nontender.  Musculoskeletal showed no major deformities.  Neurologic exam was  nonfocal.  Skin showed no evidence of ulcerations.  His extremities show  2+ radial pulses and biphasic posterior tibial, dorsalis pedis, and  peroneal signal bilaterally.   He had ABIs and bypass graft duplex exam today at our office.  This  showed a patent right femoral-popliteal bypass graft, appears with  biphasic wave forms proximal, mid, and distal to the graft.  ABI was  0.14 on the right and 0.68 on the left, which were stable from his  previous ABIs in  October 2010, which were 1.23 on the right and 0.66 on  the left.   Patient continues to do well following revision of his right fem-pop  bypass.  He has had no rest pain or claudication symptoms or nonhealing  wounds.  We will continue to follow him in 6 months or sooner if he  develops any new symptoms.  Also encouraged him to resume taking a daily  aspirin if okay with his primary physician, Dr. Rodrigo Ran.   Jerold Coombe, PA   V. Charlena Cross, MD  Electronically Signed   AWZ/MEDQ  D:  02/09/2010  T:  02/09/2010  Job:  [id number]   cc:   Loraine Leriche A. Perini, M.D.

## 2011-03-12 NOTE — Procedures (Signed)
BYPASS GRAFT EVALUATION   INDICATION:  Follow up right lower extremity bypass graft.   HISTORY:  Diabetes:  Yes.  Cardiac:  No.  Hypertension:  No.  Smoking:  Quit.  Previous Surgery:  Right femoral-popliteal artery bypass graft on  02/15/05 by Dr. Hart Rochester.   SINGLE LEVEL ARTERIAL EXAM                               RIGHT              LEFT  Brachial:                    118                113  Anterior tibial:             131                90  Posterior tibial:            136                88  Peroneal:  Ankle/brachial index:        1.15               0.76   PREVIOUS ABI:  Date: 07/29/08  RIGHT:  1.0  LEFT:  0.7   LOWER EXTREMITY BYPASS GRAFT DUPLEX EXAM:   DUPLEX:  Doppler arterial waveforms appear biphasic proximal to, within,  and distal to bypass graft.   IMPRESSION:  1. Patent right femoropopliteal artery bypass graft.  2. Ankle brachial indices appear stable bilaterally.  3. No significant changes from previous study.   ___________________________________________  Quita Skye Hart Rochester, M.D.   AS/MEDQ  D:  02/06/2009  T:  02/06/2009  Job:  [id number]

## 2011-03-15 NOTE — Op Note (Signed)
NAME:  [name] [name], [name] [name]                           ACCOUNT NO.:  [id number]   MEDICAL RECORD NO.:  [id number]                   PATIENT TYPE:  INP   LOCATION:  NA                                   FACILITY:  [facility]   PHYSICIAN:  Quita Skye. Hart Rochester, M.D.               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   OPERATION PERFORMED:  1. Abdominal aortogram with bilateral lower extremity runoff via left common     femoral approach.  2. Selective catheterization right common iliac artery with right lower     extremity angiogram.   SURGEON:  Quita Skye. Hart Rochester, M.D.   ANESTHESIA:  Local Xylocaine.   COMPLICATIONS:  None.   DESCRIPTION OF PROCEDURE:  The patient was taken to the peripheral  endovascular lab and placed in supine position at which time both groins  were prepped with Betadine scrub and solution and draped in routine sterile  manner.  After infiltration of 1% Xylocaine, the left common femoral artery  was entered percutaneously, guidewire passed into the suprarenal aorta under  fluoroscopic guidance.  A 5 French sheath and dilator were passed over the  guidewire.  Dilator removed and standard pigtail catheter positioned in the  suprarenal aorta.  Flush abdominal aortogram performed injecting 20 mL of  contrast, 20 mL per second.  This revealed the aorta to be widely patent.  There were some mild fibromuscular changes in the proximal right renal  artery with no severe stenosis and a widely patent left renal artery.  Inferior mesenteric artery.  There were mild degenerative changes in the  infrarenal aorta but no stenosis.  Both common internal and external iliac  arteries were widely patent.   Catheter was withdrawn into the terminal aorta  and bilateral lower extremity runoff performed injecting 88 mL of contrast  at 8 mL per second.  This revealed the right common iliac, external iliac,  common femoral arteries to be widely patent.  The right profunda was also  patent.  There was diffuse disease in the right superficial femoral artery  throughout with total occlusion at the level of the adductor canal with  reconstitution of the popliteal artery 3 cm above the knee joint.  Below-  knee popliteal artery was relatively free of disease and there was primarily  two vessel runoff on the right via the peroneal and posterior tibial  arteries with anterior tibial artery being severely diseased proximally.  The left leg had a patent superficial femoral artery to the distal thigh  where there was an approximately 70% stenosis at the adductor canal with a  patent popliteal artery across the knee joint and three vessel  runoff on the  left.  Following this, the pigtail catheter was exchanged for an IMA  catheter and the right common iliac artery was selectively catheterized and  better views of the right leg both in the common femoral level and the below-  knee popliteal level and proximal runoff were performed using the RAO  projection using the peek hold technique with two separate injections.  This  better delineated the runoff on the right as previously described.  The  right profunda femoris was widely patent as was the common femoral artery.  The catheter was removed over the guidewire.  Sheath removed.  Adequate  compression applied, no complications ensued.   FINDINGS:  1. Mild fibromuscular disease, right renal artery.  2. Right superficial femoral occlusion at the adductor canal with     reconstitution of the popliteal artery at the level of the knee joint     with two vessel runoff on the right via peroneal and posterior tibial     artery.  3. 70% stenosis of the distal left  superficial femoral artery with patent     popliteal artery and three-vessel runoff on the left.                                               Quita Skye Hart Rochester, M.D.    JDL/MEDQ  D:  01/05/2004  T:  01/05/2004  Job:  [id number]

## 2011-03-15 NOTE — Discharge Summary (Signed)
NAMEAVARY, PITSENBARGER NO.:  [id number]   MEDICAL RECORD NO.:  [id number]          PATIENT TYPE:  INP   LOCATION:  1607                         FACILITY:  [facility]   PHYSICIAN:  Jene Every, M.D.    DATE OF BIRTH:  Nov 24, 1932   DATE OF ADMISSION:  12/19/2007  DATE OF DISCHARGE:  12/22/2007                               DISCHARGE SUMMARY   ADMISSION DIAGNOSIS:  1. Bimalleolar left ankle fracture with small open abrasion.  2. Insulin dependent diabetes.  3. Neuropathy.  4. Lymphoma.  5. Coronary artery disease.  6. Hypertension.  7. Hypercholesterolemia.   DISCHARGE DIAGNOSIS:  1. Bimalleolar left ankle fracture with small open abrasion.  2. Insulin dependent diabetes.  3. Neuropathy.  4. Lymphoma.  5. Coronary artery disease.  6. Hypertension.  7. Hypercholesterolemia.  8. Status post open reduction and internal fixation.  9. Irrigation and debridement of the left ankle.   HISTORY:  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 His leg got  caught, twisted and noted immediate deformity to the left ankle. He was  brought via EMS to [facility] emergency room where x-rays revealed  bimalleolar left ankle fracture. He was evaluated in the emergency room  by Dr. Shelle Iron who felt he needed ORIF of the ankle as well as irrigation  and debridement of a small open abrasion. The risks and benefits of the  surgery were discussed with the patient as well as the family. He does  elect to proceed.Marland Kitchen   PROCEDURE:  The patient was taken to the OR on December 19, 2007 to  undergo ORIF of the left ankle.  Surgeon Dr. Tinnie Gens, assistant  Roma Schanz, PA-C. Anesthesia general.  Complications none.   CONSULTANT:  PT, OT, case management.   LABORATORY:  Preoperative CBC shows a white cell count of 8.7,  hemoglobin 12.3, hematocrit 36.5.  These were monitored throughout the  hospital course.   Hemoglobin remained stable at the time of discharge,  11.1, hematocrit 32.8. Preoperative coagulation studies show PT of 13.0,  INR 1.0.  Routine chemistries done preoperatively show a sodium of 133,  potassium 4.5, glucose of 266 with a normal BUN and creatinine.  These  were followed throughout the hospital course.  Sodium did drop to a  level 136, however at the time of discharge normalized to 136, potassium  remained stable. Glucose fluctuated at the time of discharge to 173.  Renal function remained stable. Preoperative EKG showed normal sinus  rhythm, nonspecific T-wave abnormalities were noted. Preoperative chest  x-ray shows small cardiac enlargement chronic with chronic appearing  lung changes without definite acute pulmonary process. Postoperative  films of the ankle showed satisfactory position following ORIF.   HOSPITAL COURSE:  The patient was admitted, taken to the OR and  underwent the above stated procedure without difficulty.  He was felt  placed in a soft Jones wrap with a posterior stirrup type splint. He was  then transferred to the PACU and then to the orthopedic  floor for  continued postoperative care. Postoperatively the patient did fairly  well.  He noted minimal pain which was controlled on p.o. analgesics.  Vital signs remained stable. PT/OT was consulted which recommend home  health care for the patient. Throughout the hospital course the patient  continued do fairly well. We noted minimal swelling, minimal pain.  Postoperative day #3, it was felt the patient was stable to be  discharged home. Vital signs remained stable.  The splint was clean, dry  and intact. Motor and neurovascular function looked intact at the left  foot. All home health needs have been arranged.   DISPOSITION:  The patient discharged home with home health PT and OT.  Follow up with Dr. Shelle Iron in approximately 10-14 days for suture removal  as well as splint change.   ACTIVITY:  He is to  be nonweightbearing on the left lower extremity. We  discussed ice and elevation. Keep the cast clean and dry.   DISCHARGE MEDICATIONS:  1. Norco 5/325 one p.o. q.3 h p.r.n. pain.  2. Aspirin 325 mg daily.  3. Vitamin C 500 mg daily.  The patient will continue to use an      incentive spirometer at home. He is to ambulate with crutches or a      walker. Again nonweightbearing to the left lower extremity.  DVT      prophylaxis was discussed with the patient as well as emergent need      if he develops chest pain.   DIET:  As tolerated.   He will continue with all home medications.   CONDITION ON DISCHARGE:  Stable.   FINAL DIAGNOSIS:  Status post open reduction and internal fixation of  the left ankle.      Roma Schanz, P.A.      Jene Every, M.D.  Electronically Signed    CS/MEDQ  D:  01/06/2008  T:  01/07/2008  Job:  [id number]

## 2011-03-15 NOTE — H&P (Signed)
NAME:  COLSON, BARCO                           ACCOUNT NO.:  [id number]   MEDICAL RECORD NO.:  [id number]                   PATIENT TYPE:  INP   LOCATION:  NA                                   FACILITY:  [facility]   PHYSICIAN:  Quita Skye. Hart Rochester, M.D.               DATE OF BIRTH:  23-Feb-1933   DATE OF ADMISSION:  DATE OF DISCHARGE:                                HISTORY & PHYSICAL   CHIEF COMPLAINT:  Right leg pain.   HISTORY OF PRESENT ILLNESS:  The patient is a 77 year old white male who for  the past year has experienced right leg claudication symptoms. He states  that he develops right calf cramping with ambulation specifically when  walking uphill or walking long distances. The pain seems to be persisting  and worsening. He denies any history of rest pain, night pain, nonhealing  lower extremity ulcers, or hip or thigh or buttock claudication symptoms. He  was initially seen by his medical doctor, Dr. Rodrigo Ran, and was referred  to Dr. Jerilee Field for further evaluation. In our office, ankle brachial  indices were performed which were 0.69 on the right and 1.0 on the left.  Because of his worsening symptoms and his decreased ABI on the right, he was  scheduled for outpatient arteriography. This was performed on January 05, 2004  by Dr. Hart Rochester, and he was found to have an occluded superficial femoral  artery on the right with patent below-knee popliteal system. It was Dr.  Candie Chroman opinion that he should be admitted at this time and undergo right  femoral to popliteal bypass graft for relief of symptoms and to decrease his  risk of lower extremity ischemia.   PAST MEDICAL HISTORY:  1. Coronary artery disease status post cardiac catheterization in the past,     treated medically.  2. Hypertension.  3. Dyslipidemia.  4. Type 2 noninsulin-dependent diabetes mellitus.  5. History of lymphoma status post chemotherapy around five years ago and     followed by Dr. Truett Perna.  6.  Peripheral neuropathy thought to be secondary to his previous     chemotherapy.  7. History of colon polyps.  8. Questionable history of lower GI bleed a number of years ago.   PAST SURGICAL HISTORY:  Lymph node biopsy five years ago prior to his  diagnosis of lymphoma.   CURRENT MEDICATIONS:  1. Neurontin 100 mg t.i.d.  2. Lisinopril 20 mg b.i.d.  3. Diltiazem CD 180 mg q.d.  4 . Avandamet 2/500 two tablets b.i.d.  1. Glyburide 10 mg b.i.d.  2. Gemfibrozil 600 mg b.i.d.  3. Pravachol 20 mg q.h.s.   ALLERGIES:  No known drug allergies.   SOCIAL HISTORY:  He is divorced and resides alone. He does have two children  and a number of grandchildren. He is a retired Naval architect. He smoked one  pack of cigarettes per day and  has smoked for 50 years plus. He denies any  history of alcohol use.   FAMILY HISTORY:  His mother died of colon cancer. His father died of a CVA.  He has one brother who has colon cancer. One brother has history of diabetes  mellitus. Two brothers have had coronary artery disease and are status post  CABG. He has one sister who has diabetes mellitus and one sister who is  alive and well with no known medical problems.   REVIEW OF SYSTEMS:  See history of present illness for pertinent positives  and negatives. Also he wears glasses and dentures. He does complain of some  dyspnea on exertion as well as a dry smoker's cough. He has peripheral  neuropathy in his feet. He has a history of lymphoma and is followed  regularly by Dr. Truett Perna. He also has a history of colon polyps and most  recently underwent a colonoscopy about two weeks ago with negative pathology  on polyps which were removed. He denies fevers, chills, recent infections,  weight loss, TIA symptoms, amaurosis fugax, visual changes, dysphagia,  weakness, syncope, chest pain, palpitations, shortness of breath, orthopnea,  paroxysmal nocturnal dyspnea, abdominal pain, nausea, vomiting, diarrhea,   constipation, reflux symptoms, hematemesis, hematochezia, melena, hematuria,  nocturia, dysuria, lower extremity edema, anxiety, depression, intolerance  to heat or cold.   PHYSICAL EXAMINATION:  VITAL SIGNS:  Blood pressure is 123/57, heart rate 80  and regular, respirations 16 and unlabored, temperature is 97.  GENERAL:  This is a well-developed, well-nourished, white male in no acute  distress.  HEENT:  Normocephalic, atraumatic. Pupils are equal, round, and reactive to  light and accommodation. Extraocular movements intact. Exam of the external  ears and nose reveals no abnormalities. He does have some telangiectasia on  the cheeks bilaterally. Oropharynx is clear with moist mucous membranes. He  is edentulous with upper dentures currently in place.  NECK:  Supple without lymphadenopathy, thyromegaly, or carotid bruits.  HEART:  Regular rate and rhythm without murmurs, rubs, or gallops.  LUNGS:  Clear to auscultation.  ABDOMEN:  Soft, obese, nontender, nondistended with active bowel sounds in  all quadrants. No masses or hepatosplenomegaly.  EXTREMITIES:  He has 3+ right femoral pulse. His left femoral pulse cannot  be ascertained as there is a pressure dressing in place from his calf  puncture site. His feet are cool bilaterally and sensation is slightly  decreased. He has positive dorsalis pedis Doppler signals bilaterally. Motor  function is intact.  NEUROLOGICAL:  Cranial nerves II-XII grossly intact.   STUDIES:  Arteriogram:  Results showed a patent aortoiliac system with a  right superficial femoral artery occlusion and patent below-knee popliteal  system. There is three vessel run off.   ASSESSMENT/PLAN:  This is a 77 year old male with right lower extremity  claudication for one year. Arteriogram shows a right superficial femoral  artery occlusion with patent below-knee popliteal and three vessel run off. It is Dr. Candie Chroman opinion that he should proceed with a right  femoral to  popliteal bypass graft at this time. He will be admitted to [facility] on January 05, 2004 to proceed with surgery.      Coral Ceo, P.A.                        Quita Skye Hart Rochester, M.D.    GC/MEDQ  D:  01/05/2004  T:  01/06/2004  Job:  [id number]   cc:  Mark A. Waynard Edwards, M.D.  [address]  [place]  [place] [address]  Fax: [phone]   Leighton Roach. Truett Perna, M.D.  [address] [facility]  [place]  [place] [address]  Fax: [phone]   Peter M. Swaz[place]and, M.D.  [address], [address]  [place], [place] [address]  Fax: [phone]

## 2011-03-15 NOTE — H&P (Signed)
[name] [name], [name] NO.:  [id number]   MEDICAL RECORD NO.:  [id number]          PATIENT TYPE:  INP   LOCATION:  NA                           FACILITY:  [facility]   PHYSICIAN:  Quita Skye. Hart Rochester, M.D.  DATE OF BIRTH:  Mar 03, 1933   DATE OF ADMISSION:  02/14/2005  DATE OF DISCHARGE:                                HISTORY & PHYSICAL   HISTORY OF PRESENT ILLNESS:  The patient is a 77 year old Caucasian male  well-known to [facility] and associates having a history of peripheral vascular  arterial occlusive disease.  In March of 2005 he underwent a right femoral  popliteal bypass by Dr. Hart Rochester.  He developed a vein graft stenosis in the  distal portion of the graft in September of 2005 and underwent angioplasty  procedure at that time with a good clinical result.  Recent follow-up  surveillance revealed that there is an area again that appears to have  developed a stenosis or intimal hyperplasia.  His ankle brachial indexes  dropped to a level of 0.66 on the right side.  It is Dr. Candie Chroman opinion  that the patient is at risk for occlusion of the graft is nothing is done  and he will be admitted on February 14, 2005 for arteriography and on April 21  he will undergo a revision of the graft pending review of the results of the  arteriogram.  The patient has some calf claudication with exertion.  He  denies rest pain, lesions, ulcerations, or nonhealing wounds.  There is some  long-standing peripheral neuropathy.  He has lost no significant motor  function.   PAST MEDICAL HISTORY:  1.  Peripheral vascular occlusive disease.  2.  Coronary artery disease.  3.  Hypertension.  4.  Hyperlipidemia.  5.  Diabetes mellitus type 2.  6.  History of non-Hodgkin's lymphoma diagnosed 2000 followed by Dr. Mancel Bale.  7.  Peripheral neuropathy.  8.  History of colon polyps.  9.  History of lower GI bleed.  10. History of COPD and ongoing heavy tobacco abuse.   PAST SURGICAL  HISTORY:  1.  Lymph node biopsy as described.  2.  Right femoral popliteal March 2005 by Dr. Hart Rochester.  3.  PTA of vein graft stenosis right fem-pop bypass in September of 2005.   ALLERGIES:  No known drug allergies.   CURRENT MEDICATIONS:  1.  Gabapentin 300 mg t.i.d.  2.  Metformin 1000 mg b.i.d.  3.  Avandia 8 mg daily.  4.  Diltiazem ER 90 mg daily.  5.  Triglide 160 mg daily.  6.  Glyburide 10 mg b.i.d.  7.  Crestor 10 mg daily.  8.  Lisinopril 20 mg b.i.d.  9.  Aspirin 81 mg daily.   REVIEW OF SYSTEMS:  See history of present illness for pertinent positives  and negatives.  Otherwise, he denies full system review.  There is some  shortness of breath he does describe with exertion, however, when prompted.  He denies chest pain.   SOCIAL HISTORY:  He is divorced with two  children.  Lives alone.  Tobacco  use is approximately one pack per day for 51 years.  He is a retired Ecologist.  Alcohol use:  None.   FAMILY HISTORY:  His mother deceased from colon carcinoma.  Father deceased  after a cerebrovascular accident.  He has one brother with colon cancer, one  brother with diabetes, two brothers with coronary disease, one sister with  diabetes.   PHYSICAL EXAMINATION:  VITAL SIGNS:  Blood pressure 130/60 on the left,  heart rate 76, respirations 16.  GENERAL:  This is a 77 year old obese white male in no acute distress.  HEENT:  Normocephalic, atraumatic.  Pupils are equal, round, and reactive to  light.  Extraocular movements intact.  Pharynx is clear with pink moist  mucosa.  No obvious lesions.  Sclerae is anicteric.  Mouth is edentulous but  he does have full dentures.  NECK:  Supple.  No jugular venous distention.  No carotid bruits.  No  lymphadenopathy.  PULMONARY:  Symmetric on inspiration.  Breathing appears slightly labored.  There is scattered rhonchi in the upper airway.  CARDIAC:  Regular rate and rhythm.  Normal S1, S2 with 2/6 systolic murmur.  ABDOMEN:   Soft, nontender, nondistended.  Normoactive bowel sounds.  There  is an audible bruit versus transmitted cardiac murmur.  He is obese.  GENITOURINARY:  Deferred.  RECTAL:  Deferred.  EXTREMITIES:  Remarkable for mild varicosities bilateral.  His right calf is  significantly larger than his left.  SKIN:  Peripheral skin examination is warm.  PERIPHERAL PULSES:  Palpable radial bilateral, palpable femoral bilateral,  palpable dorsalis bilateral, absent posterior tibial on right, palpable on  left.  NEUROLOGIC:  Grossly nonfocal.  Gait is steady but he is impaired due to  deconditioning and obesity.  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   PLAN:  Arteriography and subsequent surgical revision depending on findings  at time of this study.     WEG/MEDQ  D:  02/12/2005  T:  02/12/2005  Job:  [id number]   cc:   Leighton Roach. Truett Perna, M.D.  [address] [facility]  [place]  [place] [address]  Fax: [phone]

## 2011-03-15 NOTE — Op Note (Signed)
[name] [name], [name] [name] NO.:  [id number]   MEDICAL RECORD NO.:  [id number]          PATIENT TYPE:  OIB   LOCATION:  2899                         FACILITY:  [facility]   PHYSICIAN:  Quita Skye. Hart Rochester, M.D.  DATE OF BIRTH:  1933-03-02   DATE OF PROCEDURE:  07/26/2004  DATE OF DISCHARGE:                                 OPERATIVE REPORT   PREOPERATIVE DIAGNOSIS:  Vein graft stenosis involving right femoral  popliteal bypass graft.   POSTOPERATIVE DIAGNOSIS:  Vein graft stenosis involving right femoral  popliteal bypass graft.   PROCEDURE:  1.  Abdominal aortogram with bilateral lower extremity runoff.  2.  Selective catheterization of right external iliac artery with right      lower extremity angiogram.  3.  PTA of right femoral popliteal vein graft stenosis using a 3 mm x 2 cm      Slalom PTA catheter and a 0.018 Flex-T wire at 10 atmospheres for 30      seconds and at 14 atmospheres for 30 seconds with completion angiogram.   SURGEON:  Quita Skye. Hart Rochester, M.D.   ANESTHESIA:  Local, heparin 3000 mL.  Contrast 245 mL.   COMPLICATIONS:  None.   DESCRIPTION OF PROCEDURE:  The patient was taken to [facility] and placed in the supine position at which time both groins  were prepped with Betadine scrub and solution and draped in the routine  sterile manner.  After infiltration of 1% Xylocaine, the left common femoral  artery was entered percutaneously, guide wire passed into the external iliac  artery under fluoroscopic guidance.  A 5 French sheath and dilator were  passed over the guide wire, dilator removed, and the Wooley wire would  advance into the infrarenal aorta.  A standard pigtail catheter was  positioned in the suprarenal aorta and flush abdominal aortogram performed  injecting 20 mL of contrast at 20 mL per second.  This revealed the aorta to  be widely patent with some aneurysmal changes in the midportion between the  renal arteries  and the bifurcation extending to the patient's left side.  Both renal arteries were widely patent and single.  The common internal and  external iliac arteries had some mild disease particularly in the origin of  the left common iliac artery, but no significant stenosis was noted.  The  catheter was withdrawn into the terminal aorta and bilateral lower extremity  runoff was performed injecting 88 mL of contrast at 8 mL per second.  This  revealed the right side to have widely patent external iliac, common femora,  and profunda femoris artery.  There was a patent right femoral to below knee  bypass with saphenous vein and three-vessel runoff on the right with  diseased tibial vessels which were patent.  On the left side, the common  femoral and profunda femoris arteries were widely patent.  The superficial  femoral artery on the left had some mild diffuse disease with a moderate  stenosis at the adductor canal distally approximating 50 to 60% and three-  vessel runoff on the  left.  Following this, to get better visualization of  the vein graft which had a stenosis at the level of the knee joint, the  pigtail catheter was exchanged for a Rim catheter and a glide wire advanced  into the right external iliac artery and the Rim catheter exchanged for an  end-hole catheter.  Different views of the vein graft stenosis at the distal  aspect of the right femoral popliteal bypass graft were obtained using the  peak hole technique and these revealed an 80% stenosis over about 1 to 1.5  cm of length.  The distal aspect of the right femoral popliteal vein graft  at the level of the knee joint.  It was decided to intervene with balloon  angioplasty after discussing this with the patient and therefore, 3000 units  of heparin as given intravenously.  Amplatz wire was passed through the end-  hole catheter.  The end-hole catheter removed and the sheath exchanged for a  Terumo (6 [place]) sheath which was  advanced to the right external iliac  artery.  The Amplatz wire was removed and using a 0.018 Flex-T wire, a 3 mm  x 2 cm Slalom PTA catheter was advanced to the lesion and two angioplasties  were performed, one at 10 atmospheres for 30 seconds, and a second at 14  atmospheres for 30 seconds with completion angiogram revealing significant  improvement, but not complete resolution of the area of stenosis.  The  catheter and wire were then removed, the sheath removed and after correction  of the ACT, the sheath removed.  Adequate compression applied.  No  complications ensued.   FINDINGS:  1.  Widely patent aortoiliac system with mild aneurysmal dilatation of      infrarenal aorta.  2.  Diffuse mild left superficial femoral occlusive disease with 50%      stenosis in the distal adductor canal.  3.  Three vessel runoff below the knee on the left.  4.  Patent right femoral popliteal vein graft with 80% stenosis at the level      of the knee joint and three vessel runoff on the right.  5.  Successful PTA of right femoral popliteal vein graft stenosis using      Slalom 3 mm x 2 cm PTA catheter at 10 atmospheres for 30 seconds and 14      atmospheres for 30 seconds.       JDL/MEDQ  D:  07/26/2004  T:  07/26/2004  Job:  [id number]

## 2011-03-15 NOTE — Op Note (Signed)
NAMEJAVELLE, [name] [name] NO.:  [id number]   MEDICAL RECORD NO.:  [id number]          PATIENT TYPE:  OIB   LOCATION:  2550                         FACILITY:  [facility]   PHYSICIAN:  Quita Skye. Hart Rochester, M.D.  DATE OF BIRTH:  1933-09-20   DATE OF PROCEDURE:  02/15/2005  DATE OF DISCHARGE:                                 OPERATIVE REPORT   PREOPERATIVE DIAGNOSIS:  Failing right femoral-to-below-knee-popliteal  bypass graft secondary to severe vein graft stenosis in distal aspect graft.   POSTOPERATIVE DIAGNOSIS:  Failing right femoral-to-below-knee-popliteal  bypass graft secondary to severe vein graft stenosis in distal aspect graft.   OPERATION:  Revision of right femoropopliteal bypass graft with insertion of  an interposition reversed saphenous vein graft from distal right leg,  replacing the lower portion of the vein graft into the below-knee popliteal  artery.   SURGEON:  Jaydan Meidinger. Hart Rochester, M.D.   FIRST ASSISTANT:  Coral Ceo, P.A.   ANESTHESIA:  General endotracheal.   PROCEDURE:  The patient was  taken to the operating room and placed in  supine position, at which time satisfactory general endotracheal anesthesia  was administered.  The right leg was prepped with Betadine scrub and  solution, and draped in routine sterile manner.  Incision was made through  the previous scar below the knee and the distal end of the vein graft was  exposed where it was anastomosed to the below-knee popliteal artery.  The  vein was exposed as high as possibly from the below-knee approach up to the  knee joint and slightly higher.  There was a definite decrease in the  caliber of the vein with thickening consistent with the angiogram which  started about 3 cm proximal to the distal anastomosis and extended  proximally about 3 cm.  A short piece of saphenous vein was then removed  from the ankle after making a longitudinal incision anterior to the medial  malleolus; this vein was  excellent vein and was removed; there were no  branches to ligate; it was ligated proximally and distally with silk ties  and gently dilated heparinized saline.  This patient was then heparinized.  The saphenous vein graft was occluded below the knee and the popliteal  artery had been exposed both proximal and distal to the previous anastomosis  and was occluded with Vesseloops.  A longitudinal opening was made through  the small-caliber area of the vein graft.  Prior to this, listening with a  Doppler, it was very high in velocity coming through this area.  Proximal to  this, the vein was 3.5 mm in size; it was spatulated.  The previous small  segment was excised down to the distal anastomosis and the hood of the vein  graft was opened longitudinally onto the popliteal artery.  The new piece of  graft was then used as an interposition graft; after spatulating both ends,  a 6-0 Prolene anastomosis was done proximally and distally, clamps released  and there was excellent pulse in the popliteal artery.  Intraoperative  arteriogram was not performed.  Protamine was given to  reverse the heparin, adequate hemostasis achieved and  the wound was closed in layers with Vicryl in a subcuticular fashion,  sterile dressing applied and the patient taken to the recovery room in  satisfactory condition.      JDL/MEDQ  D:  02/15/2005  T:  02/16/2005  Job:  [id number]

## 2011-03-15 NOTE — Discharge Summary (Signed)
NAME:  [name] [name], [name] [name] NO.:  [id number]   MEDICAL RECORD NO.:  [id number]                   PATIENT TYPE:  INP   LOCATION:  2011                                 FACILITY:  [facility]   PHYSICIAN:  Quita Skye. Hart Rochester, M.D.               DATE OF BIRTH:  1933/07/10   DATE OF ADMISSION:  01/09/2004  DATE OF DISCHARGE:  01/13/2004                                 DISCHARGE SUMMARY   PRIMARY ADMITTING DIAGNOSIS:  Right leg pain.   ADDITIONAL/DISCHARGE DIAGNOSES:  1. Right lower extremity claudication.  2. Peripheral vascular occlusive disease.  3. Coronary artery disease treated medically.  4. Hypertension.  5. Dyslipidemia.  6. Type 2 noninsulin-dependent diabetes mellitus.  7. History of lymphoma status post chemotherapy.  8. Peripheral neuropathy thought to be secondary to his previous     chemotherapy.  9. History of colon polyp.  10.      Questionable history of lower gastrointestinal bleed a number of     years ago.   PROCEDURES PERFORMED:  1. Left femoral to below-knee popliteal bypass using nonreversed     translocated saphenous vein.  2. Limited right femoral embolectomy.  3. Intraoperative arteriogram.   HISTORY:  The patient is a 77 year old male with a history of right leg  claudication symptoms which have been going on for about a year.  He  develops right calf cramping with ambulation, especially when walking up  hill or long distances.  The pain seems to be worsening and occurring more  frequently.  There has been no history of rest pain, night pain, nonhealing  lower extremity ulcers, hip, thigh, or buttock claudication symptoms.  He  was seen initially by his medical doctor, Dr. Rodrigo Ran and was referred  to Dr. Jerilee Field for further evaluation.  Ankle-brachial indices were  performed which were 0.69 on the right and 1.0 on the left.  Because of his  decreased ABI on the right with worsening symptoms of claudication it was  recommended that he should undergo outpatient arteriography.  Dr. Hart Rochester  performed this on January 05, 2004 and this showed an occluded SFA on the  right and a patent below-knee popliteal system.  Dr. Hart Rochester felt that the  best course of action for relief of symptoms would be to proceed with a  right fem-pop bypass graft at this time.   HOSPITAL COURSE:  He was admitted to [facility] on January 09, 2004  and was taken to the operating room where he underwent a right femoral to  below-knee popliteal bypass graft as described above.  The intraoperative  arteriogram was performed showing a widely patent anastamosis with three  vessel runoff.  The patient tolerated the procedure well and was transferred  to the floor in stable condition.  Postoperatively he has done well.  He has  maintained 2+ palpable dorsalis pedis and posterior tibial pulses  on the  right postoperatively.  He was seen by physical therapy and has been  ambulating without difficulty with the assistance of PT and using a rolling  walker.  His postoperative ankle-brachial index on the right is 0.85.  His  foot has remained warm and well perfused.  He is tolerating a regular diet  and is having normal bowel and bladder function.  He has remained afebrile  and all vital signs have been stable throughout the admission.  It is felt  that if he continues to remain stable over the next 24 hours and no changes  occur he will be ready for discharge home hopefully on January 13, 2004.   DISCHARGE MEDICATIONS:  1. Neurontin 100 mg t.i.d.  2. Lisinopril 20 mg b.i.d.  3. Diltiazem CD 180 mg once daily.  4. Avandamet 2/500 two tabs b.i.d.  5. Glyburide 10 mg b.i.d.  6. Gemfibrozil 600 mg b.i.d.  7. Pravachol 20 mg once daily.  8. Tylox one to two q.4h. p.r.n. pain.   DISCHARGE INSTRUCTIONS:  He is to refrain from driving, heavy lifting, or  strenuous activity.  He may continue daily walking and use of his incentive  spirometry.   He is asked to continue his same preoperative diet.  He may  shower daily and clean his incisions with soap and water.   DISCHARGE FOLLOWUP:  Home health has been arranged for nursing care and  physical therapy.  He will follow up with Dr. Hart Rochester in the office in 3  weeks and have repeat ABI's at that time.  Our office will call and arrange  this appointment.  He is asked to call in the interim if he experiences any  problems or has questions.      Coral Ceo, P.A.                        Quita Skye Hart Rochester, M.D.    GC/MEDQ  D:  01/12/2004  T:  01/16/2004  Job:  [id number]   cc:   Loraine Leriche A. Waynard Edwards, M.D.  [address]  [place]  [place] [address]  Fax: [phone]   Leighton Roach. Truett Perna, M.D.  [address]  [place]  [place] [address]  Fax: [phone]   Peter M. [place], M.D.  [address], [address]  [place], [place] [address]  Fax: [phone]

## 2011-03-15 NOTE — Discharge Summary (Signed)
NAMEEDWORD, CU NO.:  [id number]   MEDICAL RECORD NO.:  [id number]          PATIENT TYPE:  OIB   LOCATION:  2016                         FACILITY:  [facility]   PHYSICIAN:  Quita Skye. Hart Rochester, M.D.  DATE OF BIRTH:  1933/09/08   DATE OF ADMISSION:  DATE OF DISCHARGE:  02/17/2005                                 DISCHARGE SUMMARY   PRIMARY ADMISSION DIAGNOSIS:  Recurrent right femoropopliteal bypass graft,  vein graft stenosis.   ADDITIONAL DISCHARGE DIAGNOSES:  1.  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  2..  Peripheral vascular occlusive disease.  1.  History of nonHodgkin's lymphoma.  2.  Coronary artery disease.  3.  Hypertension.  4.  Hyperlipidemia.  5.  Type 2 non-insulin-dependent diabetes mellitus.  6.  History of peripheral neuropathy.  7.  History of colon polyp.  8.  Questionable history of lower gastrointestinal bleed.  9.  Chronic obstructive pulmonary disease.  10. Tobacco abuse.   PROCEDURE PERFORMED:  Resection of right femoropopliteal vein graft stenosis  with insertion of interposition reverse saphenous vein graft to the below-  knee popliteal artery.   HISTORY:  The patient is a 77 year old white male, patient of Dr. Candie Chroman  who had undergone a previous right femoropopliteal bypass graft in March  2005.  He developed a vein graft stenosis in September 2005 which required a  PTA of the area.  Initially, he had a good result but when he returned for  recent surveillance follow-up, he had a decrease in his ankle-brachial index  to 66%.  He underwent duplex scan in our office which showed recurrence of  the vein graft stenosis.   Because of this, he underwent an arteriogram on April 20 by Dr. Hart Rochester.  He  was found to have a widely patent aortoiliac system with a recurrent right  femoropopliteal vein graft stenosis just distal to the knee joint at 80-90%.  Dr. Hart Rochester recommended proceeding with surgical revision of his  graft and  repair of the vein graft stenosis at this time.   HOSPITAL COURSE:  The patient was admitted to [facility] on February 15, 2005.  He underwent  revision of his right femoropopliteal bypass graft with resection of the  area of vein graft stenosis and interposition graft.  He tolerated the  procedure well.  The patient was transferred from the recovery room to the  floor in stable condition.   Postoperatively, he has done very well.  He has 2+ palpable dorsalis pedis  and posterior tibial pulses in his right foot. His surgical incision sites  are all healing well.  He is ambulating in the halls without difficulty.  He  has been weaned from supplemental oxygen.  He has been afebrile and all  vital signs have been stable.   Postop labs show hemoglobin 12.4, hematocrit 37.1, platelets 266, white  count 12.3. Sodium 137, potassium 4.6, BUN 7, creatinine 1.0.   It was felt that if he continues to remain stable over the next 24 hours, he  will hopefully be ready for discharge home on February 17, 2005.   DISCHARGE MEDICATIONS:  1.  Gabapentin 300 mg t.i.d.  2.  Metformin 1000 mg b.i.d.  3.  Avandia 8 mg daily.  4.  Diltiazem ER 90 mg daily.  5.  Triguide 160 mg daily.  6.  Glyburide 10 mg b.i.d.  7.  Crestor 10 mg daily.  8.  Lisinopril 20 mg b.i.d.  9.  Aspirin 81 mg daily.  10. Tylox 1-2 p.o. q.4h. p.r.n. pain.   DISCHARGE INSTRUCTIONS:  He was asked to refrain from driving, heavy lifting  or strenuous activity.  He may continue ambulating daily and using his  incentive spirometry.  He will continue the same preoperative diet. He may  shower daily and clean his incisions with soap and water.   DISCHARGE FOLLOW-UP:  Dr. Hart Rochester will see him back in 3 weeks with repeat  ankle-brachial indices. He is asked to contact our office if he experiences  any problems following discharge.      GC/MEDQ  D:  02/16/2005  T:  02/17/2005  Job:  [id number]   cc:   Loraine Leriche A. Waynard Edwards, M.D.  [address]  [place]  [place] [address]  Fax: [phone]   Peter M. [place], M.D.  [address]  [place], [place] [address]  Fax: [phone]

## 2011-03-15 NOTE — Op Note (Signed)
[name] [name], LUCE NO.:  [id number]   MEDICAL RECORD NO.:  [id number]          PATIENT TYPE:  INP   LOCATION:  2899                         FACILITY:  [facility]   PHYSICIAN:  Quita Skye. Hart Rochester, M.D.  DATE OF BIRTH:  27-Nov-1932   DATE OF PROCEDURE:  02/14/2005  DATE OF DISCHARGE:                                 OPERATIVE REPORT   PREOPERATIVE DIAGNOSIS:  Recurrent right femoral-popliteal vein graft  stenosis.   PROCEDURES:  1. Abdominal aortogram with bilateral iliac angiography via left common      femoral approach.  2. Selective catheterization of right external iliac artery with right      lower extremity angiogram.     SURGEON:  Quita Skye. Hart Rochester, M.D.   ANESTHESIA:  Local Xylocaine.   CONTRAST:  80 mL.   COMPLICATIONS:  None.   ANESTHESIA:  Local Xylocaine.   DESCRIPTION OF PROCEDURE:  The patient was taken to the [facility]  peripheral endovascular lab, placed in the supine position, at which time  both groins were prepped with Betadine scrub and solution and draped in the  routine sterile manner.  After infiltration of 1% Xylocaine, the left common  femoral artery was entered percutaneously, a guidewire passed into the  suprarenal aorta under fluoroscopic guidance.  A 5-French sheath and dilator  were passed over the guidewire, dilator removed, and the standard pigtail  catheter positioned in the suprarenal aorta.  Abdominal aortogram was  performed injecting 20 mL of contrast at 20 cc/sec.  This revealed the aorta  to be widely patent with both renal arteries to be single and widely patent.  The inferior mesenteric artery was patent.  There were atherosclerotic  changes in the terminal aorta with some redundancy of the left common iliac  artery near the origin, which had made it slightly difficult to cross this  area initially, therefore the reason for the aortogram.  However, there was  wide patency with no evidence of stenosis.  Both common,  internal and  external iliac arteries were widely patent with no narrowing noted.  The  pigtail catheter was exchanged for an IMA catheter and a Glidewire was then  passed into the right external iliac artery, the IMA catheter exchanged for  an end-hole catheter, which was positioned in the right external iliac  artery.  Right lower extremity angiogram was performed through the end-hole  catheter.  This revealed the right common and profunda femoris arteries to  be widely patent.  The right femoral to below-knee popliteal vein graft was  patent although borderline in size.  There were no areas of any significant  narrowing throughout the entire length of the vein graft, although it did  taper slightly just above the knee in diameter.  Just below the knee joint,  there was a focal 90-95% stenosis over a 2 cm in length beginning directly  at the knee joint, extending down to a point about a centimeter or two above  the distal anastomosis but not involving the distal anastomosis.  Tibial  vessels were all small and diseased, the best  being the anterior tibial  artery, the posterior tibial artery being occluded distally.  Additional  views of the distal end of the vein graft were obtained using the peek-hold  technique, confirming the above findings with a slightly small vein graft  but the main problem being the severe stenosis below the knee joint.  Having  tolerated procedure well, the end-hole catheter was removed, the sheath  removed, adequate compression applied.  No complications ensued.   FINDINGS:  1. Widely patent aortoiliac system.  2. Single widely patent renal arteries bilaterally.  3. Patent right femoral to below-knee popliteal vein graft with focal      recurrent stenosis below the knee joint but proximal to the distal      anastomosis over a 2 cm segment.  4. Significant tibial disease with small diseased tibial arteries and      occlusion distally of right posterior tibial  artery.        JDL/MEDQ  D:  02/14/2005  T:  02/14/2005  Job:  [id number]

## 2011-03-15 NOTE — Op Note (Signed)
NAMEETHEL, VERONICA NO.:  [id number]   MEDICAL RECORD NO.:  [id number]                   PATIENT TYPE:  INP   LOCATION:  3306                                 FACILITY:  [facility]   PHYSICIAN:  Quita Skye. Hart Rochester, M.D.               DATE OF BIRTH:  [birth year]   DATE OF PROCEDURE:  DATE OF DISCHARGE:  01/09/2004                                 OPERATIVE REPORT   PREOPERATIVE DIAGNOSIS:  Right superficial femoral occlusive disease with  severe claudication, right leg.   POSTOPERATIVE DIAGNOSIS:  Right superficial femoral occlusive disease with  severe claudication, right leg.   PROCEDURE:  Right common femoral to below knee popliteal bypass using a  nonreverse translocated saphenous vein graft in the right leg with a limited  right femoral endarterectomy.   SURGEON:  Hiawatha Merriott. Hart Rochester, M.D.   FIRST ASSISTANT:  Claudette TNils Flack, N.P.   ANESTHESIA:  General endotracheal.   DESCRIPTION OF PROCEDURE:  The patient was taken to the operating room and  placed in the supine position at which time satisfactory general  endotracheal anesthesia was administered.  The right leg was prepped with  Betadine scrub and solution and draped in a routine sterile manner.  The  saphenous vein was exposed from the saphenofemoral junction to the mid calf,  branches ligated with 4-0 and 5-0 silk ties and divided.  It was removed and  gently dilated with heparinized saline, marked for orientation purposes.  It  was a satisfactory vein being at least 3 mm in size distally.  The common  superficial and profundus femoris arteries were dissected free in the  inguinal incision.  There was a heavily calcified plaque posteriorly in the  common femoral artery extending down to the origin of the profunda.  The  popliteal artery was exposed below the knee and encircled with the vessel  loops.  It was a soft vessel at this point.  A subfascial anatomic tunnel  was created, and the patient  was heparinized.  The femoral vessels were then  occluded with vascular clamps.  Longitudinal opening made in the common  femoral artery with a 15 blade, extended with the Potts scissors down to the  origin of the superficial femoral.  There was a severely calcified plaque  posteriorly and a lot of intraluminal calcific debris in the common femoral  artery which was removed by a limited endarterectomy.  There was excellent  backbleeding from the profunda.  There was also good inflow.  The proximal  end of the vein was then spatulated and anastomosed end-to-side with 6-0  Prolene.  Clamps were then released, and there was an excellent pulse in the  first set of competent valves. Using retrograde valvotome, the valves were  rendered incompetent with result in excellent flow out of the distal end of  the vein graft.  The vein was carefully delivered through the tunnel  and  then anastomosed end-to-side to the below the knee popliteal artery with 6-0  Prolene.  Clamps were released, and there was an excellent pulse in the vein  graft and palpable posterior pulse at the ankle.  Intraoperative arteriogram  revealed a widely patent anastomosis with three vessel runoff.  Protamine  was given to reverse the heparin.  Following adequate hemostasis, the wound  was irrigated with saline, closed in layers with Vicryl in a subcuticular  fashion.  A sterile dressing was applied, and the patient was taken to the  recovery room in satisfactory condition.                                               Quita Skye Hart Rochester, M.D.   JDL/MEDQ  D:  01/09/2004  T:  01/10/2004  Job:  [id number]

## 2011-07-22 LAB — COMPREHENSIVE METABOLIC PANEL
AST: 17
AST: 17
AST: 20
Albumin: 2.9 — ABNORMAL LOW
Alkaline Phosphatase: 40
CO2: 25
CO2: 29
Calcium: 9.2
Calcium: 9.7
Chloride: 97
Creatinine, Ser: 1.05
Creatinine, Ser: 1.07
GFR calc Af Amer: >60
GFR calc Af Amer: >60
GFR calc Af Amer: >60
GFR calc non Af Amer: >60
GFR calc non Af Amer: >60
Glucose, Bld: 173 — ABNORMAL HIGH
Potassium: 3.8
Sodium: 130 — ABNORMAL LOW
Total Bilirubin: 1
Total Protein: 5.8 — ABNORMAL LOW
Total Protein: 6.1

## 2011-07-22 LAB — CBC
HCT: 36.5 — ABNORMAL LOW
Platelets: 252
RDW: 13.9
WBC: 8.7

## 2011-07-22 LAB — BASIC METABOLIC PANEL
BUN: 17
Calcium: 10.2
Creatinine, Ser: 1.22
GFR calc non Af Amer: 58 — ABNORMAL LOW
Glucose, Bld: 266 — ABNORMAL HIGH

## 2011-07-22 LAB — PROTIME-INR
INR: 1
Prothrombin Time: 13

## 2011-07-22 LAB — HEMOGLOBIN AND HEMATOCRIT, BLOOD
HCT: 32.8 — ABNORMAL LOW
Hemoglobin: 11.1 — ABNORMAL LOW
Hemoglobin: 11.1 — ABNORMAL LOW

## 2011-10-10 ENCOUNTER — Ambulatory Visit (INDEPENDENT_AMBULATORY_CARE_PROVIDER_SITE_OTHER): Payer: Medicare Other | Admitting: *Deleted

## 2011-10-10 ENCOUNTER — Other Ambulatory Visit (INDEPENDENT_AMBULATORY_CARE_PROVIDER_SITE_OTHER): Payer: Medicare Other | Admitting: *Deleted

## 2011-10-10 DIAGNOSIS — Z48812 Encounter for surgical aftercare following surgery on the circulatory system: Secondary | ICD-10-CM

## 2011-10-10 DIAGNOSIS — I70219 Atherosclerosis of native arteries of extremities with intermittent claudication, unspecified extremity: Secondary | ICD-10-CM

## 2011-10-16 ENCOUNTER — Encounter: Payer: Self-pay | Admitting: Vascular Surgery

## 2011-10-16 NOTE — Procedures (Unsigned)
BYPASS GRAFT EVALUATION  INDICATION:  Right lower extremity bypass graft.  HISTORY: Diabetes:  Yes. Cardiac:  No. Hypertension:  No. Smoking:  Previous. Previous Surgery:  Right fem-pop bypass graft on 01/27/2005.  SINGLE LEVEL ARTERIAL EXAM                              RIGHT              LEFT Brachial: Anterior tibial: Posterior tibial: Peroneal: Ankle/brachial index:  PREVIOUS ABI:  Date:  RIGHT:  LEFT:  LOWER EXTREMITY BYPASS GRAFT DUPLEX EXAM:  DUPLEX:  Biphasic Doppler waveforms noted throughout the right lower extremity bypass graft with no increase in velocities.  IMPRESSION: 1. Patent right femoropopliteal bypass graft with no evidence of     stenosis. 2. Bilateral ankle brachial indices are noted on a separate report.  ___________________________________________ Quita Skye. Hart Rochester, M.D.  CH/MEDQ  D:  10/11/2011  T:  10/11/2011  Job:  [id number]

## 2011-11-07 ENCOUNTER — Telehealth: Payer: Self-pay | Admitting: Oncology

## 2011-11-07 ENCOUNTER — Ambulatory Visit (HOSPITAL_BASED_OUTPATIENT_CLINIC_OR_DEPARTMENT_OTHER): Payer: Medicare Other | Admitting: Oncology

## 2011-11-07 VITALS — BP 118/66 | HR 64 | Temp 97.0°F | Ht 71.0 in | Wt 239.1 lb

## 2011-11-07 DIAGNOSIS — C8589 Other specified types of non-Hodgkin lymphoma, extranodal and solid organ sites: Secondary | ICD-10-CM

## 2011-11-07 NOTE — Progress Notes (Signed)
OFFICE PROGRESS NOTE  INTERVAL HISTORY:   He returns as scheduled. He feels well. There are no palpable lymph nodes. He has a good appetite. He reports intentional weight loss with a change in his diet.  Objective:  Vital signs in last 24 hours:  Blood pressure 118/66, pulse 64, temperature 97 F (36.1 C), temperature source Oral, height 5\' 11"  (1.803 m), weight 239 lb 1.6 oz (108.455 kg).    HEENT: Neck without mass Lymphatics: No cervical, supraclavicular, axillary, or inguinal lymph nodes Resp: Lungs clear bilaterally Cardio: Regular rate and rhythm GI: No hepatosplenomegaly Vascular: No leg edema     Medications: I have reviewed the patient's current medications.  Assessment/Plan: 1. 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  Neulasta support was started beginning with cycle #2.  He received Zinecard with the 3 cycles of CHOP/Rituxan.  The last cycle of chemotherapy was given in May of 2010.  a. A restaging PET scan 02/20/2009 indicated marked improvement in the hypermetabolic lymphadenopathy.  b. A CT of the abdomen and pelvis in October 2010 revealed no evidence for disease progression.  2. History of severe neutropenia following cycle #1 of CHOP/Rituxan.  3. Non-Hodgkin lymphoma diagnosed in September 2000 with clinical remission following CHOP chemotherapy.  4. Diabetes.  5. 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  6. Abdominal aortic aneurysm noted on a CT in October 2010.    Disposition:  She remains in clinical remission from the non-Hodgkin's lymphoma. He will return for an office visit in 9 months.   Lucile Shutters, MD  11/07/2011  7:37 PM

## 2011-11-07 NOTE — Telephone Encounter (Signed)
gve the pt his oct 2013 appt calendar °

## 2012-08-11 ENCOUNTER — Telehealth: Payer: Self-pay | Admitting: Oncology

## 2012-08-11 ENCOUNTER — Ambulatory Visit (HOSPITAL_BASED_OUTPATIENT_CLINIC_OR_DEPARTMENT_OTHER): Payer: Medicare Other | Admitting: Oncology

## 2012-08-11 VITALS — BP 108/63 | HR 91 | Temp 97.0°F | Resp 18 | Ht 71.0 in | Wt 230.4 lb

## 2012-08-11 DIAGNOSIS — Z87898 Personal history of other specified conditions: Secondary | ICD-10-CM

## 2012-08-11 DIAGNOSIS — C8589 Other specified types of non-Hodgkin lymphoma, extranodal and solid organ sites: Secondary | ICD-10-CM

## 2012-08-11 NOTE — Progress Notes (Signed)
   [facility]    OFFICE PROGRESS NOTE   INTERVAL HISTORY:   He returns as scheduled. He feels well. Good appetite. No fever, night sweats, or palpable lymph nodes.  Objective:  Vital signs in last 24 hours:  Blood pressure 108/63, pulse 91, temperature 97 F (36.1 C), temperature source Oral, resp. rate 18, height 5\' 11"  (1.803 m), weight 230 lb 6.4 oz (104.509 kg).    HEENT: Neck without mass Lymphatics: No cervical, supraclavicular, axillary, or inguinal nodes Resp: Lungs with end inspiratory rales at the bases bilaterally Cardio: Regular rate and rhythm GI: No hepatosplenomegaly Vascular: No leg edema    Medications: I have reviewed the patient's current medications.  Assessment/Plan: 1. 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 Neulasta support was started beginning with cycle #2. He received Zinecard with the 3 cycles of CHOP/Rituxan. The last cycle of chemotherapy was given in May of 2010.  a. A restaging PET scan 02/20/2009 indicated marked improvement in the hypermetabolic lymphadenopathy.  b. A CT of the abdomen and pelvis in October 2010 revealed no evidence for disease progression.  2. History of severe neutropenia following cycle #1 of CHOP/Rituxan.  3. Non-Hodgkin lymphoma diagnosed in September 2000 with clinical remission following CHOP chemotherapy.  4. Diabetes.  5. 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  6. Abdominal aortic aneurysm noted on a CT in October 2010.   Disposition:  Mr. Current remains in clinical remission from non-Hodgkin's lipoma. He will return for an office visit in 9 months.   Thornton Papas, MD  08/11/2012  2:16 PM

## 2012-08-11 NOTE — Telephone Encounter (Signed)
Gave pt appt for July 2014 MD only °

## 2012-10-06 ENCOUNTER — Other Ambulatory Visit: Payer: Self-pay | Admitting: *Deleted

## 2012-10-06 DIAGNOSIS — I739 Peripheral vascular disease, unspecified: Secondary | ICD-10-CM

## 2012-10-06 DIAGNOSIS — Z48812 Encounter for surgical aftercare following surgery on the circulatory system: Secondary | ICD-10-CM

## 2012-10-12 ENCOUNTER — Encounter: Payer: Self-pay | Admitting: Vascular Surgery

## 2012-10-13 ENCOUNTER — Encounter (INDEPENDENT_AMBULATORY_CARE_PROVIDER_SITE_OTHER): Payer: Medicare Other | Admitting: *Deleted

## 2012-10-13 ENCOUNTER — Ambulatory Visit (INDEPENDENT_AMBULATORY_CARE_PROVIDER_SITE_OTHER): Payer: Medicare Other | Admitting: Neurosurgery

## 2012-10-13 ENCOUNTER — Encounter: Payer: Self-pay | Admitting: Neurosurgery

## 2012-10-13 VITALS — BP 111/64 | HR 84 | Resp 18 | Ht 71.0 in | Wt 241.0 lb

## 2012-10-13 DIAGNOSIS — I739 Peripheral vascular disease, unspecified: Secondary | ICD-10-CM

## 2012-10-13 DIAGNOSIS — Z48812 Encounter for surgical aftercare following surgery on the circulatory system: Secondary | ICD-10-CM

## 2012-10-13 NOTE — Progress Notes (Signed)
[facility] [place] PAD/PVD Office Note  CC: PVD surveillance Referring Physician: Hart Rochester  History of Present Illness: 78 year old male patient of Dr. Hart Rochester status post a right femoropopliteal bypass graft in 2006. The patient does have some mild claudication bilaterally with long walks but he has no rest pain nor open ulcerations on the lower extremities. The patient denies any claudication pain that is lifestyle limiting. The patient denies any new medical diagnoses or recent surgery.  Past Medical History  Diagnosis Date  . Peripheral arterial disease   . Diabetes mellitus without complication   . CAD (coronary artery disease)   . Hyperlipidemia   . Hypertension   . Non Hodgkin's lymphoma   . Peripheral neuropathy   . COPD (chronic obstructive pulmonary disease)   . History of lower GI bleeding     ROS: [x]  Positive   [ ]  Denies    General: [ ]  Weight loss, [ ]  Fever, [ ]  chills Neurologic: [ ]  Dizziness, [ ]  Blackouts, [ ]  Seizure [ ]  Stroke, [ ]  "Mini stroke", [ ]  Slurred speech, [ ]  Temporary blindness; [ ]  weakness in arms or legs, [ ]  Hoarseness Cardiac: [ ]  Chest pain/pressure, [ ]  Shortness of breath at rest [ ]  Shortness of breath with exertion, [ ]  Atrial fibrillation or irregular heartbeat Vascular: [ ]  Pain in legs with walking, [ ]  Pain in legs at rest, [ ]  Pain in legs at night,  [ ]  Non-healing ulcer, [ ]  Blood clot in vein/DVT,   Pulmonary: [ ]  Home oxygen, [ ]  Productive cough, [ ]  Coughing up blood, [ ]  Asthma,  [ ]  Wheezing Musculoskeletal:  [ ]  Arthritis, [ ]  Low back pain, [ ]  Joint pain Hematologic: [ ]  Easy Bruising, [ ]  Anemia; [ ]  Hepatitis Gastrointestinal: [ ]  Blood in stool, [ ]  Gastroesophageal Reflux/heartburn, [ ]  Trouble swallowing Urinary: [ ]  chronic Kidney disease, [ ]  on HD - [ ]  MWF or [ ]  TTHS, [ ]  Burning with urination, [ ]  Difficulty urinating Skin: [ ]  Rashes, [ ]  Wounds Psychological: [ ]  Anxiety, [ ]   Depression   Social History History  Substance Use Topics  . Smoking status: Former Smoker    Types: Cigarettes    Quit date: 10/29/2003  . Smokeless tobacco: Not on file  . Alcohol Use: No    Family History Family History  Problem Relation Age of Onset  . Cancer Mother   . Stroke Father     No Known Allergies  Current Outpatient Prescriptions  Medication Sig Dispense Refill  . Acetaminophen (TYLENOL ARTHRITIS PAIN PO) Take 650 mg by mouth 2 (two) times daily.      Marland Kitchen aspirin 81 MG tablet Take 160 mg by mouth 2 (two) times daily.      Marland Kitchen CINNAMON PO Take 1,000 mg by mouth.      . diltiazem (DILACOR XR) 180 MG 24 hr capsule Take 180 mg by mouth daily.      . fenofibrate micronized (LOFIBRA) 200 MG capsule Take 200 mg by mouth daily before breakfast.      . finasteride (PROSCAR) 5 MG tablet Take 5 mg by mouth daily.      Marland Kitchen gabapentin (NEURONTIN) 600 MG tablet Take 600 mg by mouth 2 (two) times daily.      . Glucosamine HCl-MSM 1500-500 MG/30ML LIQD Take 1 tablet by mouth 2 (two) times daily.      . insulin NPH-insulin regular (HUMULIN 70/30) (70-30) 100 UNIT/ML  injection Inject into the skin. 35 units QAM. 20units QPM      . lisinopril (PRINIVIL,ZESTRIL) 10 MG tablet Take 10 mg by mouth daily.      . metFORMIN (GLUCOPHAGE) 1000 MG tablet Take 1,000 mg by mouth 2 (two) times daily with a meal.      . PIOGLITAZONE HCL PO Take by mouth.      . Tamsulosin HCl (FLOMAX) 0.4 MG CAPS Take 0.4 mg by mouth daily.      . rosuvastatin (CRESTOR) 10 MG tablet Take 10 mg by mouth daily.        Physical Examination  Filed Vitals:   10/13/12 1109  BP: 111/64  Pulse: 84  Resp: 18    Body mass index is 33.61 kg/(m^2).  General:  WDWN in NAD Gait: Normal HEENT: WNL Eyes: Pupils equal Pulmonary: normal non-labored breathing , without Rales, rhonchi,  wheezing Cardiac: RRR, without  Murmurs, rubs or gallops; No carotid bruits Abdomen: soft, NT, no masses Skin: no rashes, ulcers  noted Vascular Exam/Pulses: Palpable femoral pulses bilaterally, PT and DP pulses are heard with Doppler bilaterally  Extremities without ischemic changes, no Gangrene , no cellulitis; no open wounds;  Musculoskeletal: no muscle wasting or atrophy  Neurologic: A&O X 3; Appropriate Affect ; SENSATION: normal; MOTOR FUNCTION:  moving all extremities equally. Speech is fluent/normal  Non-Invasive Vascular Imaging: A BS today are 1.13 and triphasic on the right, 0.88 on the left which is improvement from December 2012. The patient has a widely patent right femoropopliteal bypass  ASSESSMENT/PLAN: This is a patient with mild claudication with long periods of ambulation that does not want any further diagnosis or intervention at this time. Overall the patient feels like he is doing well. He will followup in one year with repeat bypass graft duplex and ABIs. The patient's questions were encouraged and answered, he is in agreement with this plan.  Lauree Chandler ANP  M.D.: Hart Rochester

## 2012-10-14 NOTE — Addendum Note (Signed)
Addended by: Sharee Pimple on: 10/14/2012 09:33 AM   Modules accepted: Orders

## 2013-04-29 ENCOUNTER — Encounter: Payer: Self-pay | Admitting: Internal Medicine

## 2013-05-11 ENCOUNTER — Ambulatory Visit (HOSPITAL_BASED_OUTPATIENT_CLINIC_OR_DEPARTMENT_OTHER): Payer: Medicare Other | Admitting: Oncology

## 2013-05-11 ENCOUNTER — Telehealth: Payer: Self-pay | Admitting: Oncology

## 2013-05-11 VITALS — BP 104/41 | HR 87 | Temp 97.6°F | Resp 20 | Ht 71.0 in | Wt 225.6 lb

## 2013-05-11 DIAGNOSIS — Z87898 Personal history of other specified conditions: Secondary | ICD-10-CM

## 2013-05-11 NOTE — Telephone Encounter (Signed)
gv and printed appt sched and avs forl pt. °

## 2013-05-11 NOTE — Progress Notes (Signed)
   [facility]    OFFICE PROGRESS NOTE   INTERVAL HISTORY:   He returns as scheduled. He feels well. No fever, night sweats, or palpable lymph nodes. Good appetite. He attributes weight loss to a change in his diet.  Objective:  Vital signs in last 24 hours:  Blood pressure 104/41, pulse 87, temperature 97.6 F (36.4 C), temperature source Oral, resp. rate 20, height 5\' 11"  (1.803 m), weight 225 lb 9.6 oz (102.331 kg).    HEENT: Neck without mass Lymphatics: No cervical, supra-clavicular, or inguinal nodes,? Soft mobile 1 cm left axillary node Resp: End inspiratory rales at the right posterior base, no respiratory distress Cardio: Regular rate and rhythm GI: No hepatosplenomegaly, nontender, no mass Vascular: No leg edema    Medications: I have reviewed the patient's current medications.  Assessment/Plan: 1. 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 Neulasta support was started beginning with cycle #2. He received Zinecard with the 3 cycles of CHOP/Rituxan. The last cycle of chemotherapy was given in May of 2010.  a. A restaging PET scan 02/20/2009 indicated marked improvement in the hypermetabolic lymphadenopathy.  b. A CT of the abdomen and pelvis in October 2010 revealed no evidence for disease progression.  2. History of severe neutropenia following cycle #1 of CHOP/Rituxan.  3. Non-Hodgkin lymphoma diagnosed in September 2000 with clinical remission following CHOP chemotherapy.  4. Diabetes.  5. 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  6. Abdominal aortic aneurysm noted on a CT in October 2010.   Disposition:  [name] [name] remains in clinical remission from non-Hodgkin's lymphoma. He will return for an office visit in 9 months. He will contact us in the  interim for new symptoms.   Thornton Papas, MD  05/11/2013  10:36 AM

## 2013-10-08 ENCOUNTER — Encounter: Payer: Self-pay | Admitting: Family

## 2013-10-11 ENCOUNTER — Other Ambulatory Visit: Payer: Self-pay | Admitting: Vascular Surgery

## 2013-10-11 ENCOUNTER — Encounter (HOSPITAL_COMMUNITY): Payer: Medicare Other

## 2013-10-11 ENCOUNTER — Ambulatory Visit (INDEPENDENT_AMBULATORY_CARE_PROVIDER_SITE_OTHER)
Admission: RE | Admit: 2013-10-11 | Discharge: 2013-10-11 | Disposition: A | Discharge status: Home / Self Care | Payer: Medicare Other | Source: Ambulatory Visit | Attending: Family | Admitting: Family

## 2013-10-11 ENCOUNTER — Encounter: Payer: Self-pay | Admitting: Family

## 2013-10-11 ENCOUNTER — Ambulatory Visit (INDEPENDENT_AMBULATORY_CARE_PROVIDER_SITE_OTHER): Payer: Medicare Other | Admitting: Family

## 2013-10-11 ENCOUNTER — Ambulatory Visit (HOSPITAL_COMMUNITY)
Admission: RE | Admit: 2013-10-11 | Discharge: 2013-10-11 | Disposition: A | Discharge status: Home / Self Care | Payer: Medicare Other | Source: Ambulatory Visit | Attending: Family | Admitting: Family

## 2013-10-11 ENCOUNTER — Other Ambulatory Visit: Payer: Self-pay | Admitting: *Deleted

## 2013-10-11 VITALS — BP 98/58 | HR 73 | Resp 16 | Ht 71.0 in | Wt 222.0 lb

## 2013-10-11 DIAGNOSIS — I739 Peripheral vascular disease, unspecified: Secondary | ICD-10-CM

## 2013-10-11 DIAGNOSIS — Z48812 Encounter for surgical aftercare following surgery on the circulatory system: Secondary | ICD-10-CM

## 2013-10-11 DIAGNOSIS — M79609 Pain in unspecified limb: Secondary | ICD-10-CM

## 2013-10-11 NOTE — Patient Instructions (Signed)
Peripheral Vascular Disease Peripheral Vascular Disease (PVD), also called Peripheral Arterial Disease (PAD), is a circulation problem caused by cholesterol (atherosclerotic plaque) deposits in the arteries. PVD commonly occurs in the lower extremities (legs) but it can occur in other areas of the body, such as your arms. The cholesterol buildup in the arteries reduces blood flow which can cause pain and other serious problems. The presence of PVD can place a person at risk for Coronary Artery Disease (CAD).  CAUSES  Causes of PVD can be many. It is usually associated with more than one risk factor such as:   High Cholesterol.  Smoking.  Diabetes.  Lack of exercise or inactivity.  High blood pressure (hypertension).  Obesity.  Family history. SYMPTOMS   When the lower extremities are affected, patients with PVD may experience:  Leg pain with exertion or physical activity. This is called INTERMITTENT CLAUDICATION. This may present as cramping or numbness with physical activity. The location of the pain is associated with the level of blockage. For example, blockage at the abdominal level (distal abdominal aorta) may result in buttock or hip pain. Lower leg arterial blockage may result in calf pain.  As PVD becomes more severe, pain can develop with less physical activity.  In people with severe PVD, leg pain may occur at rest.  Other PVD signs and symptoms:  Leg numbness or weakness.  Coldness in the affected leg or foot, especially when compared to the other leg.  A change in leg color.  Patients with significant PVD are more prone to ulcers or sores on toes, feet or legs. These may take longer to heal or may reoccur. The ulcers or sores can become infected.  If signs and symptoms of PVD are ignored, gangrene may occur. This can result in the loss of toes or loss of an entire limb.  Not all leg pain is related to PVD. Other medical conditions can cause leg pain such  as:  Blood clots (embolism) or Deep Vein Thrombosis.  Inflammation of the blood vessels (vasculitis).  Spinal stenosis. DIAGNOSIS  Diagnosis of PVD can involve several different types of tests. These can include:  Pulse Volume Recording Method (PVR). This test is simple, painless and does not involve the use of X-rays. PVR involves measuring and comparing the blood pressure in the arms and legs. An ABI (Ankle-Brachial Index) is calculated. The normal ratio of blood pressures is 1. As this number becomes smaller, it indicates more severe disease.  < 0.95  indicates significant narrowing in one or more leg vessels.  <0.8 there will usually be pain in the foot, leg or buttock with exercise.  <0.4 will usually have pain in the legs at rest.  <0.25  usually indicates limb threatening PVD.  Doppler detection of pulses in the legs. This test is painless and checks to see if you have a pulses in your legs/feet.  A dye or contrast material (a substance that highlights the blood vessels so they show up on x-ray) may be given to help your caregiver better see the arteries for the following tests. The dye is eliminated from your body by the kidney's. Your caregiver may order blood work to check your kidney function and other laboratory values before the following tests are performed:  Magnetic Resonance Angiography (MRA). An MRA is a picture study of the blood vessels and arteries. The MRA machine uses a large magnet to produce images of the blood vessels.  Computed Tomography Angiography (CTA). A CTA is a   specialized x-ray that looks at how the blood flows in your blood vessels. An IV may be inserted into your arm so contrast dye can be injected.  Angiogram. Is a procedure that uses x-rays to look at your blood vessels. This procedure is minimally invasive, meaning a small incision (cut) is made in your groin. A small tube (catheter) is then inserted into the artery of your groin. The catheter is  guided to the blood vessel or artery your caregiver wants to examine. Contrast dye is injected into the catheter. X-rays are then taken of the blood vessel or artery. After the images are obtained, the catheter is taken out. TREATMENT  Treatment of PVD involves many interventions which may include:  Lifestyle changes:  Quitting smoking.  Exercise.  Following a low fat, low cholesterol diet.  Control of diabetes.  Foot care is very important to the PVD patient. Good foot care can help prevent infection.  Medication:  Cholesterol-lowering medicine.  Blood pressure medicine.  Anti-platelet drugs.  Certain medicines may reduce symptoms of Intermittent Claudication.  Interventional/Surgical options:  Angioplasty. An Angioplasty is a procedure that inflates a balloon in the blocked artery. This opens the blocked artery to improve blood flow.  Stent Implant. A wire mesh tube (stent) is placed in the artery. The stent expands and stays in place, allowing the artery to remain open.  Peripheral Bypass Surgery. This is a surgical procedure that reroutes the blood around a blocked artery to help improve blood flow. This type of procedure may be performed if Angioplasty or stent implants are not an option. SEEK IMMEDIATE MEDICAL CARE IF:   You develop pain or numbness in your arms or legs.  Your arm or leg turns cold, becomes blue in color.  You develop redness, warmth, swelling and pain in your arms or legs. MAKE SURE YOU:   Understand these instructions.  Will watch your condition.  Will get help right away if you are not doing well or get worse. Document Released: 11/21/2004 Document Revised: 01/06/2012 Document Reviewed: 10/18/2008 [employer] Patient Information 2014 [employer].  

## 2013-10-11 NOTE — Progress Notes (Signed)
[facility] [place] HISTORY AND PHYSICAL -PAD  History of Present Illness [name] [name] is a 79 y.o. male male patient of Dr. Hart Rochester status post a right femoropopliteal bypass graft in 2006.  Reports aching in both calves equally after walking 3 times around the inside of [employer], is not worsening, right ankle gives him problems occasionally as he has hardware there, denies non-healing wounds. He reports diabetic neuropathy as numbness in all toes and needles feeling in toes, denies any known lumbar spine problems. Denies any history of stroke or TIA symptoms. Reports he lost 28 pounds with effort in about 1-2 years.  Patient denies New Medical or Surgical History.   Pt Diabetic: Yes, seems to be in fair control. Pt smoker: former smoker, quit 6-7 years ago  Pt meds include: Statin :Yes ASA: Yes Other anticoagulants/antiplatelets: no  Past Medical History  Diagnosis Date  . Peripheral arterial disease   . Diabetes mellitus without complication   . CAD (coronary artery disease)   . Hyperlipidemia   . Hypertension   . Non Hodgkin's lymphoma   . Peripheral neuropathy   . COPD (chronic obstructive pulmonary disease)   . History of lower GI bleeding     Social History History  Substance Use Topics  . Smoking status: Former Smoker    Types: Cigarettes    Quit date: 10/29/2003  . Smokeless tobacco: Not on file  . Alcohol Use: No    Family History Family History  Problem Relation Age of Onset  . Cancer Mother   . Stroke Father     Past Surgical History  Procedure Laterality Date  . Fracture surgery  2009    Left ankle  . Pr vein bypass graft,aorto-fem-pop  2005    Right common femoral to BK popliteal BPG  . Pr vein bypass graft,aorto-fem-pop  2006    Revision of Right fem-pop BPG    No Known Allergies  Current Outpatient Prescriptions  Medication Sig Dispense Refill  . Acetaminophen (TYLENOL ARTHRITIS PAIN PO) Take 650 mg by mouth 2 (two)  times daily.      Marland Kitchen aspirin 81 MG tablet Take 160 mg by mouth 2 (two) times daily.      Marland Kitchen CINNAMON PO Take 1,000 mg by mouth.      . diltiazem (DILACOR XR) 180 MG 24 hr capsule Take 180 mg by mouth daily.      . DULoxetine (CYMBALTA) 30 MG capsule Take 30 mg by mouth daily.      . fenofibrate micronized (LOFIBRA) 200 MG capsule Take 200 mg by mouth daily before breakfast.      . finasteride (PROSCAR) 5 MG tablet Take 5 mg by mouth daily.      Marland Kitchen gabapentin (NEURONTIN) 600 MG tablet Take 600 mg by mouth 2 (two) times daily.      . Glucosamine HCl-MSM 1500-500 MG/30ML LIQD Take 1 tablet by mouth 2 (two) times daily.      Marland Kitchen HUMALOG MIX 75/25 KWIKPEN (75-25) 100 UNIT/ML SUPN Inject into the skin 2 (two) times daily. 35 units am & 20 units in pm if glucose > 100      . lisinopril (PRINIVIL,ZESTRIL) 10 MG tablet Take 10 mg by mouth daily.      . metFORMIN (GLUCOPHAGE) 1000 MG tablet Take 1,000 mg by mouth 2 (two) times daily with a meal.      . PIOGLITAZONE HCL PO Take by mouth.      . rosuvastatin (CRESTOR) 10 MG  tablet Take 10 mg by mouth daily.      . Tamsulosin HCl (FLOMAX) 0.4 MG CAPS Take 0.4 mg by mouth daily.      Marland Kitchen ULTRA-THIN II SHORT PEN NEEDLE 31G X 8 MM MISC        No current facility-administered medications for this visit.    ROS: [x]  Positive   [ ]  Denies  General:[ ]  Weight loss,  [ ]  Weight gain, [ ]  Fever, [ ]  chills Neurologic: [ ]  Dizziness, [ ]  Blackouts, [ ]  Seizure [ ]  Stroke, [ ]  "Mini stroke", [ ]  Slurred speech, [ ]  Temporary blindness;  [ ] weakness, [ ]  Hoarseness Cardiac: [ ]  Chest pain/pressure, [ ]  Shortness of breath at rest [ ]  Shortness of breath with exertion,  [ ]   Atrial fibrillation or irregular heartbeat Vascular:[ ]  Pain in legs with walking, [ ]  Pain in legs at rest ,[ ]  Pain in legs at night,  [ ]   Non-healing ulcer, [ ]  Blood clot in vein/DVT,   Pulmonary: [ ]  Home oxygen, [ ]   Productive cough, [ ]  Coughing up blood,  [ ]  Asthma,  [ ]   Wheezing Musculoskeletal:  [ ]  Arthritis, [ ]  Low back pain,  [ ]  Joint pain Hematologic:[ ]  Easy Bruising, [ ]  Anemia; [ ]  Hepatitis Gastrointestinal: [ ]  Blood in stool,  [ ]  Gastroesophageal Reflux, [ ]  Trouble swallowing Urinary: [ ]  chronic Kidney disease, [ ]  on HD, [ ]  Burning with urination, [ ]  Frequent urination, [ ]  Difficulty urinating;  Skin: [ ]  Rashes, [ ]  Wounds     Physical Examination  Filed Vitals:   10/11/13 1037  BP: 98/58  Pulse: 73  Resp: 16   Filed Weights   10/11/13 1037  Weight: 222 lb (100.699 kg)   Body mass index is 30.98 kg/(m^2).  General: A&O x 3, WDWN, obese. Gait: normal Eyes: PERRLA, Pulmonary: CTAB, without wheezes , rales or rhonchi Cardiac: regular Rythm , with murmur          Carotid Bruits Left Right   Negative Negative   Radial pulses: 2+ and =                           VASCULAR EXAM: Extremities without ischemic changes  without Gangrene; without open wounds.                                                                                                          LE Pulses LEFT RIGHT       POPLITEAL  not palpable   not palpable       POSTERIOR TIBIAL  not palpable   not palpable        DORSALIS PEDIS      ANTERIOR TIBIAL not palpable  not palpable    Abdomen: soft, NT, no masses. Skin: no rashes, no ulcers noted. Musculoskeletal: no muscle wasting or atrophy.  Neurologic: A&O X 3; Appropriate Affect ; SENSATION: normal; MOTOR FUNCTION:  moving all extremities equally, motor strength  5/5 throughout. Speech is fluent/normal. CN 2-12 intact.    Non-Invasive Vascular Imaging: DATE: 10/11/2013 ABI: RIGHT 1.4, Waveforms: bi and triphasic;  LEFT 0.63, Waveforms: monophasic. DUPLEX SCAN OF BYPASS: Widely patent right fem-pop. BPG with no evidence of stenosis. The left arterial system appears occluded beginning in the prox. SFA and extending to the distal SFA; however, collaterals are observed.  ASSESSMENT: [name] [name] is  a 79 y.o. male who is status post a right femoropopliteal bypass graft in 2006. He has mild claudication symptoms in his calves, right equal to left after walking 3 times around the inside of his [employer]. His left ABI;s are worse this year, right LE are stable, the ABI's indicate arterial calcification. His risk factors for progression of PAD are obesity and DM.  He has already lost 28 pounds with effort, he was encouraged to continue these efforts and the regular walking that he does. He was also encourage to work closely with his provider that manages his DM to attain as good control of his DM as possible.  PLAN:  I discussed in depth with the patient the nature of atherosclerosis, and emphasized the importance of maximal medical management including strict control of blood pressure, blood glucose, and lipid levels, obtaining regular exercise, and cessation of smoking.  The patient is aware that without maximal medical management the underlying atherosclerotic disease process will progress, limiting the benefit of any interventions. Based on the patient's vascular studies and examination, pt will return to clinic in 1 year for ABI's and  Bilateral LE Duplex.  The patient was given information about PAD including signs, symptoms, treatment, what symptoms should prompt the patient to seek immediate medical care, and risk reduction measures to take.  Charisse March, RN, MSN, FNP-C [facility] Phone: [phone]  Clinic MD: Myra Gianotti  10/11/2013 9:18 AM

## 2013-10-12 ENCOUNTER — Ambulatory Visit: Payer: Medicare Other | Admitting: Neurosurgery

## 2013-11-11 ENCOUNTER — Encounter: Payer: Self-pay | Admitting: Cardiovascular Disease

## 2013-11-23 ENCOUNTER — Encounter: Payer: Self-pay | Admitting: Cardiovascular Disease

## 2013-11-23 ENCOUNTER — Ambulatory Visit (INDEPENDENT_AMBULATORY_CARE_PROVIDER_SITE_OTHER): Payer: Medicare Other | Admitting: Cardiovascular Disease

## 2013-11-23 VITALS — BP 106/68 | HR 80 | Resp 16 | Ht 71.0 in | Wt 221.4 lb

## 2013-11-23 DIAGNOSIS — I739 Peripheral vascular disease, unspecified: Secondary | ICD-10-CM

## 2013-11-23 DIAGNOSIS — I2510 Atherosclerotic heart disease of native coronary artery without angina pectoris: Secondary | ICD-10-CM

## 2013-11-23 DIAGNOSIS — E785 Hyperlipidemia, unspecified: Secondary | ICD-10-CM

## 2013-11-23 DIAGNOSIS — E119 Type 2 diabetes mellitus without complications: Secondary | ICD-10-CM

## 2013-11-23 DIAGNOSIS — I10 Essential (primary) hypertension: Secondary | ICD-10-CM

## 2013-11-23 DIAGNOSIS — I714 Abdominal aortic aneurysm, without rupture, unspecified: Secondary | ICD-10-CM

## 2013-11-23 DIAGNOSIS — R079 Chest pain, unspecified: Secondary | ICD-10-CM

## 2013-11-23 DIAGNOSIS — R011 Cardiac murmur, unspecified: Secondary | ICD-10-CM

## 2013-11-23 NOTE — Patient Instructions (Signed)
Dr. Sallyanne Kuster has requested that you have an echocardiogram. Echocardiography is a painless test that uses sound waves to create images of your heart. It provides your doctor with information about the size and shape of your heart and how well your heart's chambers and valves are working. This procedure takes approximately one hour. There are no restrictions for this procedure.  Dr. Sallyanne Kuster has requested that you have en exercise stress myoview. For further information please visit [email or web address]. Please follow instruction sheet, as given.  Dr. Sallyanne Kuster recommends that you schedule a follow-up appointment in: After testing is complete.

## 2013-11-24 ENCOUNTER — Encounter: Payer: Self-pay | Admitting: Cardiovascular Disease

## 2013-11-24 ENCOUNTER — Telehealth: Payer: Self-pay | Admitting: *Deleted

## 2013-11-24 DIAGNOSIS — R011 Cardiac murmur, unspecified: Secondary | ICD-10-CM | POA: Insufficient documentation

## 2013-11-24 DIAGNOSIS — I714 Abdominal aortic aneurysm, without rupture, unspecified: Secondary | ICD-10-CM | POA: Insufficient documentation

## 2013-11-24 MED ORDER — METOPROLOL TARTRATE 25 MG PO TABS: 25.0000 mg | ORAL_TABLET | Freq: Two times a day (BID) | ORAL | Status: DC

## 2013-11-24 MED ORDER — NITROGLYCERIN 0.4 MG/SPRAY TL SOLN: 1.0000 | Status: DC | PRN

## 2013-11-24 NOTE — Assessment & Plan Note (Signed)
Lipid parameters in desirable range

## 2013-11-24 NOTE — Assessment & Plan Note (Addendum)
A1c 7.4%

## 2013-11-24 NOTE — Assessment & Plan Note (Addendum)
Not currently complaining of intermittent claudication. Most recent ABI was 0.88 on left , 1.1 on right. S/P right fem-pop bypass.

## 2013-11-24 NOTE — Telephone Encounter (Signed)
Pt transferred to triage by Estill Bamberg (per pt).  Stated he was in to see Dr. Loletha Grayer yesterday and he was supposed to send in scripts for two meds.  Pt stated he didn't know the name of them.  Stated one was "the pill you put under your tongue when you have chest pain."  Pt stated he does not know the name of the other one.  Pt informed Dr. Loletha Grayer and Pamala Hurry, CMA are both out of the office today.  Pt also informed his note is still in progress and RN is not sure of meds either.  Informed a message will be sent.  Pt verbalized understanding and agreed w/ plan.  Pt uses [facility].  Message forwarded to Dr. Darlen Round, CMA.

## 2013-11-24 NOTE — Telephone Encounter (Signed)
Returned call.  Left message on pt-identified voicemail that Rxs sent to pharmacy for metoprolol tart 25 mg twice daily and NTG spray instead of tab b/c plan prefers spray, but works the same.  Also apologized for delay and advised to call back before 4pm if questions.

## 2013-11-24 NOTE — Telephone Encounter (Signed)
I put in the prescriptions for metoprolol 25 mg twice a day and nitroglycerin 0.4 mg spray sublingual when necessary. Computer said his plan prefers the spray to the sublingual tablet. Tell him it works the same. Also apologize for me, I'm not sure how we missed putting in those prescriptions.

## 2013-11-24 NOTE — Assessment & Plan Note (Addendum)
Incidentally found, not sure if any additional imaging has been performed since 2010, when maximum diameter was 4.5 cm. May have been evaluated in Dr. Evelena Leyden office, where he is followed for PAD.

## 2013-11-24 NOTE — Assessment & Plan Note (Signed)
He has a prominent holosystolic murmur at the apex that radiates deep into the axilla and sounds like at least moderate mitral insufficiency. This may also play a role in his exertional symptoms. Check echo.

## 2013-11-24 NOTE — Assessment & Plan Note (Signed)
His symptoms are highly suggestive of CAD with stable angina pectoris, CCS class II. Although of relatively recent onset, his symptoms do not appear to suggest a pattern of acceleration/instability. The ECG is less specific due to the presence of LVH and LAFB. In the presence of PAD and multiple risk factors, the presence of CAD is almost certain - need to establish the extent, delineate amount of myocardium in jeopardy. Recommend a nuclear perfusion study. Hopefully his orthopedic and PAD problems will not prevent sufficient workload on the treadmill. Will start low dose betablocker and prn NTG.

## 2013-11-24 NOTE — Progress Notes (Signed)
Patient ID: [name] [name], male   DOB: 29-Sep-1933, 80 y.o.   MRN: [id number]     Reason for office visit Exertional dyspnea and chest pain.  [name] [name] has fairly typical complaints of exertional angina d dyspnea. He can easily walk down to his mailbox, 100 feet from his front door, but has to stop due to tightness across his upper chest and dyspnea when coming back up the short incline to his house. The discomfort dissipates after about 2 minutes of rest. The symptoms have been there for about 2 months.  He has a long standing history of PAD, s/p right femoropopliteal bypass, known occlusion of left SFA and has DM, HTN and elevated cholesterol, all appropriately addressed pharmacologically. He has a medium size AAA, discovered incidentally during treatment for abdominal non Hodgkin's lymphoma in 2010.   No Known Allergies  Current Outpatient Prescriptions  Medication Sig Dispense Refill  . Acetaminophen (TYLENOL ARTHRITIS PAIN PO) Take 650 mg by mouth 2 (two) times daily.      Marland Kitchen aspirin 81 MG tablet Take 160 mg by mouth 2 (two) times daily.      Marland Kitchen CINNAMON PO Take 1,000 mg by mouth.      . DULoxetine (CYMBALTA) 30 MG capsule Take 30 mg by mouth daily.      . fenofibrate micronized (LOFIBRA) 200 MG capsule Take 200 mg by mouth daily before breakfast.      . finasteride (PROSCAR) 5 MG tablet Take 5 mg by mouth daily.      Marland Kitchen gabapentin (NEURONTIN) 600 MG tablet Take 600 mg by mouth 2 (two) times daily.      . Glucosamine HCl-MSM 1500-500 MG/30ML LIQD Take 1 tablet by mouth 2 (two) times daily.      . Glucose Blood (FREESTYLE LITE TEST VI)       . HUMALOG MIX 75/25 KWIKPEN (75-25) 100 UNIT/ML SUPN Inject into the skin 2 (two) times daily. 35 units am & 20 units in pm if glucose > 100      . Lancets (FREESTYLE) lancets       . lisinopril (PRINIVIL,ZESTRIL) 10 MG tablet Take 10 mg by mouth daily.      . metFORMIN (GLUCOPHAGE) 1000 MG tablet Take 1,000 mg by mouth 2 (two) times daily with a meal.       . PIOGLITAZONE HCL PO Take by mouth.      . rosuvastatin (CRESTOR) 10 MG tablet Take 10 mg by mouth daily.      . Tamsulosin HCl (FLOMAX) 0.4 MG CAPS Take 0.4 mg by mouth daily.      Marland Kitchen ULTRA-THIN II SHORT PEN NEEDLE 31G X 8 MM MISC       . metoprolol tartrate (LOPRESSOR) 25 MG tablet Take 1 tablet (25 mg total) by mouth 2 (two) times daily.  90 tablet  3  . nitroGLYCERIN (NITROLINGUAL) 0.4 MG/SPRAY spray Place 1 spray under the tongue every 5 (five) minutes x 3 doses as needed for chest pain.  12 g  12   No current facility-administered medications for this visit.    Past Medical History  Diagnosis Date  . Peripheral arterial disease   . Diabetes mellitus without complication   . CAD (coronary artery disease)   . Hyperlipidemia   . Hypertension   . Non Hodgkin's lymphoma   . Peripheral neuropathy   . COPD (chronic obstructive pulmonary disease)   . History of lower GI bleeding   . Substance abuse  Past Surgical History  Procedure Laterality Date  . Fracture surgery  2009    Left ankle  . Pr vein bypass graft,aorto-fem-pop  2005    Right common femoral to BK popliteal BPG  . Pr vein bypass graft,aorto-fem-pop  01-27-05    Revision of Right fem-pop BPG  . Cardiac catheterization  1990's    Family History  Problem Relation Age of Onset  . Cancer Mother     Colon  . Heart attack Mother   . Stroke Father   . Hyperlipidemia Father   . Hyperlipidemia Sister   . Hyperlipidemia Brother   . Hyperlipidemia Daughter   . Hypertension Son     History   Social History  . Marital Status: Divorced    Spouse Name: N/A    Number of Children: N/A  . Years of Education: N/A   Occupational History  . Not on file.   Social History Main Topics  . Smoking status: Former Smoker    Types: Cigarettes    Quit date: 10/29/2003  . Smokeless tobacco: Never Used  . Alcohol Use: No  . Drug Use: No  . Sexual Activity: Not on file   Other Topics Concern  . Not on file    Social History Narrative  . No narrative on file    Review of systems: Exertional angina and dyspnea. Pain and swelling at rest in left ankle (old orthopedic injury with a metal plate in place). The patient specifically denies  orthopnea, paroxysmal nocturnal dyspnea, syncope, palpitations, focal neurological deficits.  The patient also denies abdominal pain, nausea, vomiting, dysphagia, diarrhea, constipation, polyuria, polydipsia, dysuria, hematuria, frequency, urgency, abnormal bleeding or bruising, fever, chills, unexpected weight changes, mood swings, change in skin or hair texture, change in voice quality, auditory or visual problems, allergic reactions or rashes, new musculoskeletal complaints other than usual "aches and pains".   PHYSICAL EXAM BP 106/68  Pulse 80  Resp 16  Ht _0  (1.803 m)  Wt 100.426 kg (221 lb 6.4 oz)  BMI 30.89 kg/m2  General: Alert, oriented x3, no distress Head: no evidence of trauma, PERRL, EOMI, no exophtalmos or lid lag, no myxedema, no xanthelasma; normal ears, nose and oropharynx Neck: normal jugular venous pulsations and no hepatojugular reflux; brisk carotid pulses without delay and no carotid bruits Chest: clear to auscultation, no signs of consolidation by percussion or palpation, normal fremitus, symmetrical and full respiratory excursions Cardiovascular: normal position and quality of the apical impulse, regular rhythm, normal first and second heart sounds, no rubs or gallops, 3/6 holosystolic murmur at apex, radiating towards base, but especially towards axilla. Abdomen: no tenderness or distention, no masses by palpation, no abnormal pulsatility or arterial bruits, normal bowel sounds, no hepatosplenomegaly Extremities: no clubbing, cyanosis or edema; 2+ radial, ulnar and brachial pulses bilaterally; 2+ right femoral, posterior tibial and dorsalis pedis pulses; 2+ left femoral, posterior tibial and dorsalis pedis pulses; no subclavian or  femoral bruits Neurological: grossly nonfocal unexplained weight gain, cough, hemoptysis or wheezing.  EKG: NSR, LAD, LAFB, probably also LVH  Lipid Panel  Cholesterol 111, HDL 43, LDL 51  BMET    Component Value Date/Time   NA 136 02/23/2009 1334   K 4.1 02/23/2009 1334   CL 106 02/23/2009 1334   CO2 25 02/23/2009 1334   GLUCOSE 163* 02/23/2009 1334   BUN 18 08/08/2009 1342   CREATININE 1.4 08/08/2009 1342   CALCIUM 9.3 02/23/2009 1334   GFRNONAA >60 12/22/2007 0455   GFRAA  Value: >  60        The eGFR has been calculated using the MDRD equation. This calculation has not been validated in all clinical 12/22/2007 0455     ASSESSMENT AND PLAN CAD His symptoms are highly suggestive of CAD with stable angina pectoris, CCS class II. Although of relatively recent onset, his symptoms do not appear to suggest a pattern of acceleration/instability. The ECG is less specific due to the presence of LVH and LAFB. In the presence of PAD and multiple risk factors, the presence of CAD is almost certain - need to establish the extent, delineate amount of myocardium in jeopardy. Recommend a nuclear perfusion study. Hopefully his orthopedic and PAD problems will not prevent sufficient workload on the treadmill. Will start low dose betablocker and prn NTG.   Cardiac murmur, previously undiagnosed He has a prominent holosystolic murmur at the apex that radiates deep into the axilla and sounds like at least moderate mitral insufficiency. This may also play a role in his exertional symptoms. Check echo.  DIABETES MELLITUS, TYPE II, ON INSULIN  A1c 7.4%  HYPERTENSION    PAD (peripheral artery disease) Not currently complaining of intermittent claudication. Most recent ABI was 0.88 on left , 1.1 on right. S/P right fem-pop bypass.  AAA (abdominal aortic aneurysm) without rupture Incidentally found, not sure if any additional imaging has been performed since 2010, when maximum diameter was 4.5 cm. May have  been evaluated in Dr. Evelena Leyden office, where he is followed for PAD.  HYPERLIPIDEMIA Lipid parameters in desirable range    Orders Placed This Encounter  Procedures  . Myocardial Perfusion Imaging  . EKG 12-Lead  . 2D Echocardiogram with contrast   Meds ordered this encounter  Medications  . metoprolol tartrate (LOPRESSOR) 25 MG tablet    Sig: Take 1 tablet (25 mg total) by mouth 2 (two) times daily.    Dispense:  90 tablet    Refill:  3  . nitroGLYCERIN (NITROLINGUAL) 0.4 MG/SPRAY spray    Sig: Place 1 spray under the tongue every 5 (five) minutes x 3 doses as needed for chest pain.    Dispense:  12 g    Refill:  [address]  Sanda Klein, MD, [facility] [phone] office [phone] pager

## 2013-11-25 ENCOUNTER — Inpatient Hospital Stay (HOSPITAL_COMMUNITY): Admission: RE | Admit: 2013-11-25 | Payer: Medicare Other | Source: Ambulatory Visit

## 2013-11-26 ENCOUNTER — Other Ambulatory Visit (HOSPITAL_COMMUNITY): Payer: Self-pay | Admitting: Cardiovascular Disease

## 2013-11-26 DIAGNOSIS — R079 Chest pain, unspecified: Secondary | ICD-10-CM

## 2013-11-28 HISTORY — PX: NM MYOVIEW LTD: HXRAD82

## 2013-11-28 HISTORY — PX: TRANSTHORACIC ECHOCARDIOGRAM: SHX275

## 2013-11-30 ENCOUNTER — Ambulatory Visit (HOSPITAL_COMMUNITY)
Admission: RE | Admit: 2013-11-30 | Discharge: 2013-11-30 | Disposition: A | Discharge status: Home / Self Care | Payer: Medicare Other | Source: Ambulatory Visit | Attending: Internal Medicine | Admitting: Internal Medicine

## 2013-11-30 DIAGNOSIS — R0989 Other specified symptoms and signs involving the circulatory and respiratory systems: Secondary | ICD-10-CM | POA: Insufficient documentation

## 2013-11-30 DIAGNOSIS — E669 Obesity, unspecified: Secondary | ICD-10-CM | POA: Insufficient documentation

## 2013-11-30 DIAGNOSIS — R079 Chest pain, unspecified: Secondary | ICD-10-CM | POA: Insufficient documentation

## 2013-11-30 DIAGNOSIS — I10 Essential (primary) hypertension: Secondary | ICD-10-CM | POA: Insufficient documentation

## 2013-11-30 DIAGNOSIS — E119 Type 2 diabetes mellitus without complications: Secondary | ICD-10-CM | POA: Insufficient documentation

## 2013-11-30 DIAGNOSIS — I739 Peripheral vascular disease, unspecified: Secondary | ICD-10-CM | POA: Insufficient documentation

## 2013-11-30 DIAGNOSIS — R55 Syncope and collapse: Secondary | ICD-10-CM | POA: Insufficient documentation

## 2013-11-30 DIAGNOSIS — Z8249 Family history of ischemic heart disease and other diseases of the circulatory system: Secondary | ICD-10-CM | POA: Insufficient documentation

## 2013-11-30 DIAGNOSIS — E785 Hyperlipidemia, unspecified: Secondary | ICD-10-CM | POA: Insufficient documentation

## 2013-11-30 DIAGNOSIS — R0609 Other forms of dyspnea: Secondary | ICD-10-CM | POA: Insufficient documentation

## 2013-11-30 DIAGNOSIS — R011 Cardiac murmur, unspecified: Secondary | ICD-10-CM | POA: Insufficient documentation

## 2013-11-30 DIAGNOSIS — Z87891 Personal history of nicotine dependence: Secondary | ICD-10-CM | POA: Insufficient documentation

## 2013-11-30 DIAGNOSIS — I2510 Atherosclerotic heart disease of native coronary artery without angina pectoris: Secondary | ICD-10-CM | POA: Insufficient documentation

## 2013-11-30 DIAGNOSIS — R0602 Shortness of breath: Secondary | ICD-10-CM | POA: Insufficient documentation

## 2013-11-30 DIAGNOSIS — Z794 Long term (current) use of insulin: Secondary | ICD-10-CM | POA: Insufficient documentation

## 2013-11-30 MED ORDER — TECHNETIUM TC 99M SESTAMIBI GENERIC - CARDIOLITE
10.0000 | Freq: Once | INTRAVENOUS | Status: AC | PRN
  Administered 2013-11-30: 10 via INTRAVENOUS

## 2013-11-30 MED ORDER — REGADENOSON 0.4 MG/5ML IV SOLN
0.4000 mg | Freq: Once | INTRAVENOUS | Status: AC
  Administered 2013-11-30: 0.4 mg via INTRAVENOUS

## 2013-11-30 MED ORDER — TECHNETIUM TC 99M SESTAMIBI GENERIC - CARDIOLITE
30.0000 | Freq: Once | INTRAVENOUS | Status: AC | PRN
  Administered 2013-11-30: 30 via INTRAVENOUS

## 2013-11-30 NOTE — Procedures (Addendum)
[place] [address] [place] [address] [phone]  Cardiology Nuclear Med Study  [name] [name] is a 80 y.o. male     MRN : [id number]     DOB: 11/20/1932  Procedure Date: 11/30/2013  Nuclear Med Background Indication for Stress Test:  Evaluation for Ischemia History:  CAD;murmur Cardiac Risk Factors: Family History - CAD, History of Smoking, Hypertension, IDDM Type 2, Lipids, Obesity and PVD  Symptoms:  Chest Pain, DOE, SOB and Syncope   Nuclear Pre-Procedure Caffeine/Decaff Intake:  7:00pm NPO After: 5:00am   IV Site: R Forearm  IV 0.9% NS with Angio Cath:  22g  Chest Size (in):  44"  IV Started by: Azucena Cecil, RN  Height: 5\' 11"  (1.803 m)  Cup Size: n/a  BMI:  Body mass index is 30.84 kg/(m^2). Weight:  221 lb (100.245 kg)   Tech Comments:  n/a    Nuclear Med Study 1 or 2 day study: 1 day  Stress Test Type:  [place] Provider:  Sanda Klein, MD   Resting Radionuclide: Technetium 75m Sestamibi  Resting Radionuclide Dose: 10.6 mCi   Stress Radionuclide:  Technetium 67m Sestamibi  Stress Radionuclide Dose: 30.1 mCi           Stress Protocol Rest HR: 67 Stress HR: 83  Rest BP: 139/83 Stress BP: 146/65  Exercise Time (min): n/a METS: n/a   Predicted Max HR: 142 bpm % Max HR: 63.38 bpm Rate Pressure Product: 13140  Dose of Adenosine (mg):  n/a Dose of Lexiscan: 0.4 mg  Dose of Atropine (mg): n/a Dose of Dobutamine: n/a mcg/kg/min (at max HR)  Stress Test Technologist: Leane Para, CCT Nuclear Technologist: Otho Perl, CNMT   Rest Procedure:  Myocardial perfusion imaging was performed at rest 45 minutes following the intravenous administration of Technetium 70m Sestamibi. Stress Procedure:  The patient received IV Lexiscan 0.4 mg over 15-seconds.  Technetium 50m Sestamibi injected at 30-seconds.  There were no significant changes with Lexiscan.  Quantitative spect images  were obtained after a 45 minute delay.  Transient Ischemic Dilatation (Normal <1.22):  1.00 Lung/Heart Ratio (Normal <0.45):  0.32 QGS EDV:  109 ml QGS ESV:  44 ml LV Ejection Fraction: 60%  Rest ECG: NSR - Normal EKG  Stress ECG: No significant change from baseline ECG  QPS Raw Data Images:  Normal; no motion artifact; normal heart/lung ratio. Stress Images:  There is decreased uptake in the apex. Rest Images:  There is decreased uptake in the apex. Subtraction (SDS):  Small, mostly fixed inferoapical defect.  Impression Exercise Capacity:  Lexiscan with no exercise. BP Response:  Normal blood pressure response. Clinical Symptoms:  No significant symptoms noted. ECG Impression:  No significant ECG changes with Lexiscan. Comparison with Prior Nuclear Study: No images to compare  Overall Impression:  Low risk stress nuclear study with small fixed inferoapical defect, likely artifact.  LV Wall Motion:  NL LV Function; NL Wall Motion; EF 60%.  [name], MD, [facility] Board Certified in Nuclear Cardiology Attending Cardiologist [place], MD  11/30/2013 12:38 PM

## 2013-12-02 ENCOUNTER — Ambulatory Visit (HOSPITAL_COMMUNITY)
Admission: RE | Admit: 2013-12-02 | Discharge: 2013-12-02 | Disposition: A | Discharge status: Home / Self Care | Payer: Medicare Other | Source: Ambulatory Visit | Attending: Cardiovascular Disease | Admitting: Cardiovascular Disease

## 2013-12-02 DIAGNOSIS — R011 Cardiac murmur, unspecified: Secondary | ICD-10-CM | POA: Insufficient documentation

## 2013-12-02 DIAGNOSIS — I10 Essential (primary) hypertension: Secondary | ICD-10-CM | POA: Insufficient documentation

## 2013-12-02 DIAGNOSIS — I739 Peripheral vascular disease, unspecified: Secondary | ICD-10-CM | POA: Insufficient documentation

## 2013-12-02 DIAGNOSIS — I079 Rheumatic tricuspid valve disease, unspecified: Secondary | ICD-10-CM | POA: Insufficient documentation

## 2013-12-02 DIAGNOSIS — I059 Rheumatic mitral valve disease, unspecified: Secondary | ICD-10-CM | POA: Insufficient documentation

## 2013-12-02 DIAGNOSIS — I714 Abdominal aortic aneurysm, without rupture, unspecified: Secondary | ICD-10-CM | POA: Insufficient documentation

## 2013-12-02 DIAGNOSIS — E785 Hyperlipidemia, unspecified: Secondary | ICD-10-CM | POA: Insufficient documentation

## 2013-12-02 DIAGNOSIS — I379 Nonrheumatic pulmonary valve disorder, unspecified: Secondary | ICD-10-CM | POA: Insufficient documentation

## 2013-12-02 DIAGNOSIS — R079 Chest pain, unspecified: Secondary | ICD-10-CM | POA: Insufficient documentation

## 2013-12-02 DIAGNOSIS — E119 Type 2 diabetes mellitus without complications: Secondary | ICD-10-CM | POA: Insufficient documentation

## 2013-12-02 DIAGNOSIS — I359 Nonrheumatic aortic valve disorder, unspecified: Secondary | ICD-10-CM

## 2013-12-02 NOTE — Progress Notes (Signed)
2D Echo Performed 12/02/2013    Marygrace Drought, RCS

## 2013-12-08 ENCOUNTER — Ambulatory Visit (INDEPENDENT_AMBULATORY_CARE_PROVIDER_SITE_OTHER): Payer: Medicare Other | Admitting: Cardiovascular Disease

## 2013-12-08 ENCOUNTER — Encounter: Payer: Self-pay | Admitting: Cardiovascular Disease

## 2013-12-08 VITALS — BP 118/60 | HR 64 | Resp 16 | Ht 71.0 in | Wt 222.8 lb

## 2013-12-08 DIAGNOSIS — I359 Nonrheumatic aortic valve disorder, unspecified: Secondary | ICD-10-CM

## 2013-12-08 DIAGNOSIS — I350 Nonrheumatic aortic (valve) stenosis: Secondary | ICD-10-CM | POA: Insufficient documentation

## 2013-12-08 DIAGNOSIS — I2510 Atherosclerotic heart disease of native coronary artery without angina pectoris: Secondary | ICD-10-CM

## 2013-12-08 MED ORDER — ISOSORBIDE MONONITRATE ER 30 MG PO TB24: 30.0000 mg | ORAL_TABLET | Freq: Every day | ORAL | Status: DC

## 2013-12-08 NOTE — Assessment & Plan Note (Signed)
He continues to have exertional angina pectoris. We'll add long-acting nitrates to his beta blocker. Anti-angina medication doses may be limited by blood pressure, heart rate and the presence of PAD.  If his symptoms cannot be controlled with medications or if the medications produce untoward side effects, we'll proceed with coronary angiography to see if he would benefit from revascularization. He is on appropriate lipid lowering therapy and aspirin.

## 2013-12-08 NOTE — Patient Instructions (Signed)
Your physician has recommended you make the following change in your medication: START ISOSORBIDE 30 MG DAILY  Your physician recommends that you schedule a follow-up appointment in: 4-6 weeks with Dr.Croitoru

## 2013-12-08 NOTE — Assessment & Plan Note (Signed)
Does not appear severe enough to be contributing to his symptoms.

## 2013-12-08 NOTE — Progress Notes (Signed)
Patient ID: [name] [name], male   DOB: Apr 12, 1933, 80 y.o.   MRN: [id number]      Reason for office visit Angina pectoris, followup echocardiogram and nuclear stress test results.  [name] [name] has noticed improvement in his exertional chest discomfort when he uses sublingual nitroglycerin. In fact he has orally gone through half of the spray bottle. The symptoms resolved promptly within a couple of minutes of administering nitroglycerin. They occur several times a day. But his echocardiogram and nuclear stress test showed low risk findings. He has preserved left ventricular systolic function and has only a small and fixed inferoapical defect on the perfusion studies. This would be compatible with a distal LAD lesion.  Echo shows moderate aortic valve stenosis with a valve area around 1.3 cm square and a mean gradient of 16 mm Hg the valve was markedly calcified. It was only mild mitral insufficiency. 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  He is a former smoker and has insulin requiring diabetes mellitus, he has well-established PAD , so we cannot exclude multivessel coronary artery disease. However, since he has preserved LV systolic function we will give medical therapy a good try before proceeding with invasive evaluation. This is also his preference. He seems to be tolerating beta blockers well after we started limit his last appointment. He is borderline bradycardic with a heart rate around 60.   No Known Allergies  Current Outpatient Prescriptions  Medication Sig Dispense Refill  . Acetaminophen (TYLENOL ARTHRITIS PAIN PO) Take 650 mg by mouth 2 (two) times daily.      Marland Kitchen aspirin 81 MG tablet Take 160 mg by mouth 2 (two) times daily.      Marland Kitchen CINNAMON PO Take 1,000 mg by mouth.      . DULoxetine (CYMBALTA) 30 MG capsule Take 30 mg by mouth daily.      . fenofibrate micronized (LOFIBRA) 200 MG capsule Take 200 mg by mouth daily before breakfast.      . finasteride  (PROSCAR) 5 MG tablet Take 5 mg by mouth daily.      Marland Kitchen gabapentin (NEURONTIN) 600 MG tablet Take 600 mg by mouth 2 (two) times daily.      . Glucosamine HCl-MSM 1500-500 MG/30ML LIQD Take 1 tablet by mouth 2 (two) times daily.      . Glucose Blood (FREESTYLE LITE TEST VI)       . HUMALOG MIX 75/25 KWIKPEN (75-25) 100 UNIT/ML SUPN Inject into the skin 2 (two) times daily. 35 units am & 20 units in pm if glucose > 100      . Lancets (FREESTYLE) lancets       . lisinopril (PRINIVIL,ZESTRIL) 10 MG tablet Take 10 mg by mouth daily.      . metFORMIN (GLUCOPHAGE) 1000 MG tablet Take 1,000 mg by mouth 2 (two) times daily with a meal.      . metoprolol tartrate (LOPRESSOR) 25 MG tablet Take 1 tablet (25 mg total) by mouth 2 (two) times daily.  90 tablet  3  . nitroGLYCERIN (NITROLINGUAL) 0.4 MG/SPRAY spray Place 1 spray under the tongue every 5 (five) minutes x 3 doses as needed for chest pain.  12 g  12  . PIOGLITAZONE HCL PO Take by mouth.      . rosuvastatin (CRESTOR) 10 MG tablet Take 10 mg by mouth daily.      . Tamsulosin HCl (FLOMAX) 0.4 MG CAPS Take 0.4 mg by mouth daily.      Marland Kitchen  ULTRA-THIN II SHORT PEN NEEDLE 31G X 8 MM MISC       . isosorbide mononitrate (IMDUR) 30 MG 24 hr tablet Take 1 tablet (30 mg total) by mouth daily.  90 tablet  3   No current facility-administered medications for this visit.    Past Medical History  Diagnosis Date  . Peripheral arterial disease   . Diabetes mellitus without complication   . CAD (coronary artery disease)   . Hyperlipidemia   . Hypertension   . Non Hodgkin's lymphoma   . Peripheral neuropathy   . COPD (chronic obstructive pulmonary disease)   . History of lower GI bleeding   . Substance abuse     Past Surgical History  Procedure Laterality Date  . Fracture surgery  2009    Left ankle  . Pr vein bypass graft,aorto-fem-pop  2005    Right common femoral to BK popliteal BPG  . Pr vein bypass graft,aorto-fem-pop  01-27-05    Revision of Right  fem-pop BPG  . Cardiac catheterization  1990's    Family History  Problem Relation Age of Onset  . Cancer Mother     Colon  . Heart attack Mother   . Stroke Father   . Hyperlipidemia Father   . Hyperlipidemia Sister   . Hyperlipidemia Brother   . Hyperlipidemia Daughter   . Hypertension Son     History   Social History  . Marital Status: Divorced    Spouse Name: N/A    Number of Children: N/A  . Years of Education: N/A   Occupational History  . Not on file.   Social History Main Topics  . Smoking status: Former Smoker    Types: Cigarettes    Quit date: 10/29/2003  . Smokeless tobacco: Never Used  . Alcohol Use: No  . Drug Use: No  . Sexual Activity: Not on file   Other Topics Concern  . Not on file   Social History Narrative  . No narrative on file    Review of systems: Exertional angina and dyspnea, CCS/NYHA class II  Pain and swelling at rest in left ankle (old orthopedic injury with a metal plate in place).  The patient specifically denies orthopnea, paroxysmal nocturnal dyspnea, syncope, palpitations, focal neurological deficits.  The patient also denies abdominal pain, nausea, vomiting, dysphagia, diarrhea, constipation, polyuria, polydipsia, dysuria, hematuria, frequency, urgency, abnormal bleeding or bruising, fever, chills, unexpected weight changes, mood swings, change in skin or hair texture, change in voice quality, auditory or visual problems, allergic reactions or rashes, new musculoskeletal complaints other than usual "aches and pains".   PHYSICAL EXAM BP 118/60  Pulse 64  Resp 16  Ht 5' 11"  (1.803 m)  Wt 101.061 kg (222 lb 12.8 oz)  BMI 31.09 kg/m2 General: Alert, oriented x3, no distress  Head: no evidence of trauma, PERRL, EOMI, no exophtalmos or lid lag, no myxedema, no xanthelasma; normal ears, nose and oropharynx  Neck: normal jugular venous pulsations and no hepatojugular reflux; brisk carotid pulses without delay and no carotid bruits    Chest: clear to auscultation, no signs of consolidation by percussion or palpation, normal fremitus, symmetrical and full respiratory excursions  Cardiovascular: normal position and quality of the apical impulse, regular rhythm, normal first and second heart sounds, no rubs or gallops, 3/6 holosystolic murmur at apex, radiating towards base, but especially towards axilla.  Abdomen: no tenderness or distention, no masses by palpation, no abnormal pulsatility or arterial bruits, normal bowel sounds, no hepatosplenomegaly  Extremities:  no clubbing, cyanosis or edema; 2+ radial, ulnar and brachial pulses bilaterally; 2+ right femoral, posterior tibial and dorsalis pedis pulses; 2+ left femoral, posterior tibial and dorsalis pedis pulses; no subclavian or femoral bruits  Neurological: grossly nonfocal  unexplained weight gain, cough, hemoptysis or wheezing.   EKG: NSR, LAD, LAFB, probably also LVH   Lipid Panel  Cholesterol 111, HDL 43, LDL 51  BMET    Component Value Date/Time   NA 136 02/23/2009 1334   K 4.1 02/23/2009 1334   CL 106 02/23/2009 1334   CO2 25 02/23/2009 1334   GLUCOSE 163* 02/23/2009 1334   BUN 18 08/08/2009 1342   CREATININE 1.4 08/08/2009 1342   CALCIUM 9.3 02/23/2009 1334   GFRNONAA >60 12/22/2007 0455   GFRAA  Value: >60        The eGFR has been calculated using the MDRD equation. This calculation has not been validated in all clinical 12/22/2007 0455     ASSESSMENT AND PLAN CAD He continues to have exertional angina pectoris. We'll add long-acting nitrates to his beta blocker. Anti-angina medication doses may be limited by blood pressure, heart rate and the presence of PAD.  If his symptoms cannot be controlled with medications or if the medications produce untoward side effects, we'll proceed with coronary angiography to see if he would benefit from revascularization. He is on appropriate lipid lowering therapy and aspirin.  Aortic stenosis Does not appear severe  enough to be contributing to his symptoms.   Patient Instructions  Your physician has recommended you make the following change in your medication: START ISOSORBIDE 30 MG DAILY  Your physician recommends that you schedule a follow-up appointment in: 4-6 weeks with Dr.Celesta Funderburk      Meds ordered this encounter  Medications  . isosorbide mononitrate (IMDUR) 30 MG 24 hr tablet    Sig: Take 1 tablet (30 mg total) by mouth daily.    Dispense:  90 tablet    Refill:  Zeigler Jasmon Graffam, MD, [facility] [phone] office [phone] pager

## 2013-12-13 NOTE — Progress Notes (Signed)
Tests discussed at [place] 12/08/13.

## 2014-01-06 ENCOUNTER — Ambulatory Visit (INDEPENDENT_AMBULATORY_CARE_PROVIDER_SITE_OTHER): Payer: Medicare Other | Admitting: Cardiovascular Disease

## 2014-01-06 ENCOUNTER — Encounter: Payer: Self-pay | Admitting: Cardiovascular Disease

## 2014-01-06 VITALS — BP 110/68 | HR 64 | Resp 16 | Ht 71.0 in | Wt 220.9 lb

## 2014-01-06 DIAGNOSIS — I209 Angina pectoris, unspecified: Secondary | ICD-10-CM

## 2014-01-06 DIAGNOSIS — I208 Other forms of angina pectoris: Secondary | ICD-10-CM

## 2014-01-06 MED ORDER — ISOSORBIDE MONONITRATE ER 60 MG PO TB24: 60.0000 mg | ORAL_TABLET | Freq: Every day | ORAL | Status: DC

## 2014-01-06 NOTE — Progress Notes (Signed)
Patient ID: [name] [name], male   DOB: February 16, 1933, 80 y.o.   MRN: [id number]     Reason for office visit Followup angina pectoris  [name] [name]  has exertional angina pectoris that responds to nitroglycerin. Stenosis different systolic function is normal and his nuclear stress that showed a relatively small area of abnormality here at we have elected to proceed with conservative management. He has used almost an entire bottle of nitroglycerin spray in the last month. Occasionally his chest discomfort responds to antacids which makes it will order to make sure that his symptoms are entirely secondary to coronary disease. Nevertheless, his symptoms almost always occur with activity and resolves with rest or nitroglycerin.  His electrocardiogram is unchanged showing normal sinus rhythm, left ventricular hypertrophy, left axis deviation. The QRS duration is prolonged just short of a left bundle branch block at 118 ms. There are negative T waves in leads 1 aVL   No Known Allergies  Current Outpatient Prescriptions  Medication Sig Dispense Refill  . Acetaminophen (TYLENOL ARTHRITIS PAIN PO) Take 650 mg by mouth 2 (two) times daily.      Marland Kitchen aspirin 81 MG tablet Take 160 mg by mouth 2 (two) times daily.      Marland Kitchen CINNAMON PO Take 1,000 mg by mouth.      . DULoxetine (CYMBALTA) 30 MG capsule Take 30 mg by mouth daily.      . fenofibrate micronized (LOFIBRA) 200 MG capsule Take 200 mg by mouth daily before breakfast.      . finasteride (PROSCAR) 5 MG tablet Take 5 mg by mouth daily.      Marland Kitchen gabapentin (NEURONTIN) 600 MG tablet Take 600 mg by mouth 2 (two) times daily.      . Glucosamine HCl-MSM 1500-500 MG/30ML LIQD Take 1 tablet by mouth 2 (two) times daily.      . Glucose Blood (FREESTYLE LITE TEST VI)       . HUMALOG MIX 75/25 KWIKPEN (75-25) 100 UNIT/ML SUPN Inject into the skin 2 (two) times daily. 35 units am & 20 units in pm if glucose > 100      . isosorbide mononitrate (IMDUR) 60 MG 24 hr tablet  Take 1 tablet (60 mg total) by mouth daily.  90 tablet  3  . Lancets (FREESTYLE) lancets       . lisinopril (PRINIVIL,ZESTRIL) 10 MG tablet Take 10 mg by mouth daily.      . metFORMIN (GLUCOPHAGE) 1000 MG tablet Take 1,000 mg by mouth 2 (two) times daily with a meal.      . metoprolol tartrate (LOPRESSOR) 25 MG tablet Take 1 tablet (25 mg total) by mouth 2 (two) times daily.  90 tablet  3  . nitroGLYCERIN (NITROLINGUAL) 0.4 MG/SPRAY spray Place 1 spray under the tongue every 5 (five) minutes x 3 doses as needed for chest pain.  12 g  12  . PIOGLITAZONE HCL PO Take by mouth.      . rosuvastatin (CRESTOR) 10 MG tablet Take 10 mg by mouth daily.      . Tamsulosin HCl (FLOMAX) 0.4 MG CAPS Take 0.4 mg by mouth daily.      Marland Kitchen ULTRA-THIN II SHORT PEN NEEDLE 31G X 8 MM MISC        No current facility-administered medications for this visit.    Past Medical History  Diagnosis Date  . Peripheral arterial disease   . Diabetes mellitus without complication   . CAD (coronary artery disease)   .  Hyperlipidemia   . Hypertension   . Non Hodgkin's lymphoma   . Peripheral neuropathy   . COPD (chronic obstructive pulmonary disease)   . History of lower GI bleeding   . Substance abuse     Past Surgical History  Procedure Laterality Date  . Fracture surgery  2009    Left ankle  . Pr vein bypass graft,aorto-fem-pop  2005    Right common femoral to BK popliteal BPG  . Pr vein bypass graft,aorto-fem-pop  01-27-05    Revision of Right fem-pop BPG  . Cardiac catheterization  1990's    Family History  Problem Relation Age of Onset  . Cancer Mother     Colon  . Heart attack Mother   . Stroke Father   . Hyperlipidemia Father   . Hyperlipidemia Sister   . Hyperlipidemia Brother   . Hyperlipidemia Daughter   . Hypertension Son     History   Social History  . Marital Status: Divorced    Spouse Name: N/A    Number of Children: N/A  . Years of Education: N/A   Occupational History  . Not on  file.   Social History Main Topics  . Smoking status: Former Smoker    Types: Cigarettes    Quit date: 10/29/2003  . Smokeless tobacco: Never Used  . Alcohol Use: No  . Drug Use: No  . Sexual Activity: Not on file   Other Topics Concern  . Not on file   Social History Narrative  . No narrative on file    Review of systems: Exertional angina and dyspnea, CCS/NYHA class II  Pain and swelling at rest in left ankle (old orthopedic injury with a metal plate in place).  The patient specifically denies orthopnea, paroxysmal nocturnal dyspnea, syncope, palpitations, focal neurological deficits.  The patient also denies abdominal pain, nausea, vomiting, dysphagia, diarrhea, constipation, polyuria, polydipsia, dysuria, hematuria, frequency, urgency, abnormal bleeding or bruising, fever, chills, unexpected weight changes, mood swings, change in skin or hair texture, change in voice quality, auditory or visual problems, allergic reactions or rashes, new musculoskeletal complaints other than usual "aches and pains".   PHYSICAL EXAM BP 110/68  Pulse 64  Resp 16  Ht 5' 11"  (1.803 m)  Wt 100.2 kg (220 lb 14.4 oz)  BMI 30.82 kg/m2 General: Alert, oriented x3, no distress  Head: no evidence of trauma, PERRL, EOMI, no exophtalmos or lid lag, no myxedema, no xanthelasma; normal ears, nose and oropharynx  Neck: normal jugular venous pulsations and no hepatojugular reflux; brisk carotid pulses without delay and no carotid bruits  Chest: clear to auscultation, no signs of consolidation by percussion or palpation, normal fremitus, symmetrical and full respiratory excursions  Cardiovascular: normal position and quality of the apical impulse, regular rhythm, normal first and second heart sounds, no rubs or gallops, 3/6 holosystolic murmur at apex, radiating towards base, but especially towards axilla.  Abdomen: no tenderness or distention, no masses by palpation, no abnormal pulsatility or arterial  bruits, normal bowel sounds, no hepatosplenomegaly  Extremities: no clubbing, cyanosis or edema; 2+ radial, ulnar and brachial pulses bilaterally; 2+ right femoral, posterior tibial and dorsalis pedis pulses; 2+ left femoral, posterior tibial and dorsalis pedis pulses; no subclavian or femoral bruits  Neurological: grossly nonfocal  unexplained weight gain, cough, hemoptysis or wheezing.   EKG: Sinus rhythm, incomplete left bundle branch block, inverted T waves in 1 and aVL, unchanged  Lipid Panel  No results found for this basename: chol, trig, hdl, cholhdl, vldl,  ldlcalc    BMET    Component Value Date/Time   NA 136 02/23/2009 1334   K 4.1 02/23/2009 1334   CL 106 02/23/2009 1334   CO2 25 02/23/2009 1334   GLUCOSE 163* 02/23/2009 1334   BUN 18 08/08/2009 1342   CREATININE 1.4 08/08/2009 1342   CALCIUM 9.3 02/23/2009 1334   GFRNONAA >60 12/22/2007 0455   GFRAA  Value: >60        The eGFR has been calculated using the MDRD equation. This calculation has not been validated in all clinical 12/22/2007 0455     ASSESSMENT AND PLAN  Stable angina pectoris responsive to nitroglycerin, with low risk features on noninvasive testing. Increase isosorbide mononitrate to 60 mg daily. We'll touch base by phone and may increase it further to 90 mg daily if symptoms persist. His heart rate is a little faster today we might able to increase his beta blocker as well. Otherwise consider adding Ranexa since his blood pressure is relatively low. If he continues to have angina pectoris once we reached maximal medical therapy may still have to consider coronary angiography and invasive revascularization.  Orders Placed This Encounter  Procedures  . EKG 12-Lead   Meds ordered this encounter  Medications  . DISCONTD: isosorbide mononitrate (IMDUR) 60 MG 24 hr tablet    Sig: Take 1 tablet (60 mg total) by mouth daily.    Dispense:  90 tablet    Refill:  3  . isosorbide mononitrate (IMDUR) 60 MG 24 hr tablet     Sig: Take 1 tablet (60 mg total) by mouth daily.    Dispense:  90 tablet    Refill:  [place] Faustine Tates, MD, [facility] [phone] office [phone] pager

## 2014-01-06 NOTE — Patient Instructions (Addendum)
We have increased IMDUR to 60 mg.  Please call us back in one month and tell us how much nitroglycerin have you used.

## 2014-01-06 NOTE — Progress Notes (Signed)
Reason for office visit Angina pectoris follow up  ECHO and nuclear stress test showed low risk findings. He has been on 30 mg of IMDUR and has used a bottle for Nitro in one month. Pain is reported to be across the chest without radiation. 2-3 episodes a day that last for ~5 min. Pain occurs with and without exertion, does not wake him up at night. Sometimes alleviates with antiacids and when this does not work he uses the [employer] and reports improvement of symptoms. Denies dizziness, presyncopal symptoms or dyaphoresis with the events.  No Known Allergies  Current Outpatient Prescriptions  Medication Sig Dispense Refill  . Acetaminophen (TYLENOL ARTHRITIS PAIN PO) Take 650 mg by mouth 2 (two) times daily.      Marland Kitchen aspirin 81 MG tablet Take 160 mg by mouth 2 (two) times daily.      Marland Kitchen CINNAMON PO Take 1,000 mg by mouth.      . DULoxetine (CYMBALTA) 30 MG capsule Take 30 mg by mouth daily.      . fenofibrate micronized (LOFIBRA) 200 MG capsule Take 200 mg by mouth daily before breakfast.      . finasteride (PROSCAR) 5 MG tablet Take 5 mg by mouth daily.      Marland Kitchen gabapentin (NEURONTIN) 600 MG tablet Take 600 mg by mouth 2 (two) times daily.      . Glucosamine HCl-MSM 1500-500 MG/30ML LIQD Take 1 tablet by mouth 2 (two) times daily.      . Glucose Blood (FREESTYLE LITE TEST VI)       . HUMALOG MIX 75/25 KWIKPEN (75-25) 100 UNIT/ML SUPN Inject into the skin 2 (two) times daily. 35 units am & 20 units in pm if glucose > 100      . isosorbide mononitrate (IMDUR) 30 MG 24 hr tablet Take 1 tablet (30 mg total) by mouth daily.  90 tablet  3  . Lancets (FREESTYLE) lancets       . lisinopril (PRINIVIL,ZESTRIL) 10 MG tablet Take 10 mg by mouth daily.      . metFORMIN (GLUCOPHAGE) 1000 MG tablet Take 1,000 mg by mouth 2 (two) times daily with a meal.      . metoprolol tartrate (LOPRESSOR) 25 MG tablet Take 1 tablet (25 mg total) by mouth 2 (two) times daily.  90 tablet  3  . nitroGLYCERIN  (NITROLINGUAL) 0.4 MG/SPRAY spray Place 1 spray under the tongue every 5 (five) minutes x 3 doses as needed for chest pain.  12 g  12  . PIOGLITAZONE HCL PO Take by mouth.      . rosuvastatin (CRESTOR) 10 MG tablet Take 10 mg by mouth daily.      . Tamsulosin HCl (FLOMAX) 0.4 MG CAPS Take 0.4 mg by mouth daily.      Marland Kitchen ULTRA-THIN II SHORT PEN NEEDLE 31G X 8 MM MISC        No current facility-administered medications for this visit.    Past Medical History  Diagnosis Date  . Peripheral arterial disease   . Diabetes mellitus without complication   . CAD (coronary artery disease)   . Hyperlipidemia   . Hypertension   . Non Hodgkin's lymphoma   . Peripheral neuropathy   . COPD (chronic obstructive pulmonary disease)   . History of lower GI bleeding   . Substance abuse     Past Surgical History  Procedure Laterality Date  . Fracture surgery  2009    Left ankle  .  Pr vein bypass graft,aorto-fem-pop  2005    Right common femoral to BK popliteal BPG  . Pr vein bypass graft,aorto-fem-pop  01-27-05    Revision of Right fem-pop BPG  . Cardiac catheterization  1990's    Family History  Problem Relation Age of Onset  . Cancer Mother     Colon  . Heart attack Mother   . Stroke Father   . Hyperlipidemia Father   . Hyperlipidemia Sister   . Hyperlipidemia Brother   . Hyperlipidemia Daughter   . Hypertension Son     History   Social History  . Marital Status: Divorced    Spouse Name: N/A    Number of Children: N/A  . Years of Education: N/A   Occupational History  . Not on file.   Social History Main Topics  . Smoking status: Former Smoker    Types: Cigarettes    Quit date: 10/29/2003  . Smokeless tobacco: Never Used  . Alcohol Use: No  . Drug Use: No  . Sexual Activity: Not on file   Other Topics Concern  . Not on file   Social History Narrative  . No narrative on file    Review of systems: Pt denies palpitations,orthopnea, headaches, dizziness, numbness or  weakness. No unintentional weigh loss/gain.   PHYSICAL EXAM BP 110/68  Pulse 64  Ht 5' 11"  (1.803 m)  Wt 220 lb 14.4 oz (100.2 kg)  BMI 30.82 kg/m2 Gen:  NAD HEENT: Moist mucous membranes. Neck supple. No JVD. No adenopathies.  CV: regular rhythm, no rubs or gallops, III/VI holosystolic murmur at apex, radiating to base and axilla.   PULM: Clear to auscultation bilaterally. No wheezes/rales/rhonchi ABD: Soft, non tender, non distended, normal bowel sounds EXT: No edema Neuro: Alert and oriented x3. No focalization     EKG: Left ventricular hypertrophy.  Lipid Panel  No results found for this basename: chol, trig, hdl, cholhdl, vldl, ldlcalc    BMET    Component Value Date/Time   NA 136 02/23/2009 1334   K 4.1 02/23/2009 1334   CL 106 02/23/2009 1334   CO2 25 02/23/2009 1334   GLUCOSE 163* 02/23/2009 1334   BUN 18 08/08/2009 1342   CREATININE 1.4 08/08/2009 1342   CALCIUM 9.3 02/23/2009 1334   GFRNONAA >60 12/22/2007 0455   GFRAA  Value: >60        The eGFR has been calculated using the MDRD equation. This calculation has not been validated in all clinical 12/22/2007 0455    ASSESSMENT AND PLAN  Angina pectoris: Improved but still symptomatic. EKG unchanged. Will continue medical management increasing IMDUR to 60 mg daily. F/u in 1 month via phone call and in 3 months for office visit.  Jearld Pies, MD, [facility] [phone] office [phone] pager

## 2014-01-09 ENCOUNTER — Encounter: Payer: Self-pay | Admitting: Cardiovascular Disease

## 2014-02-08 ENCOUNTER — Ambulatory Visit (HOSPITAL_BASED_OUTPATIENT_CLINIC_OR_DEPARTMENT_OTHER): Payer: Medicare Other | Admitting: Oncology

## 2014-02-08 ENCOUNTER — Telehealth: Payer: Self-pay | Admitting: Oncology

## 2014-02-08 VITALS — BP 97/51 | HR 68 | Temp 97.5°F | Resp 18 | Ht 71.0 in | Wt 216.7 lb

## 2014-02-08 DIAGNOSIS — C8583 Other specified types of non-Hodgkin lymphoma, intra-abdominal lymph nodes: Secondary | ICD-10-CM

## 2014-02-08 DIAGNOSIS — E119 Type 2 diabetes mellitus without complications: Secondary | ICD-10-CM

## 2014-02-08 DIAGNOSIS — Z87898 Personal history of other specified conditions: Secondary | ICD-10-CM

## 2014-02-08 NOTE — Patient Instructions (Signed)
[facility] Discharge Instructions  RECOMMENDATIONS MADE BY THE CONSULTANT AND ANY TEST RESULTS WILL BE SENT TO YOUR REFERRING PHYSICIAN.  EXAM FINDINGS BY THE PHYSICIAN TODAY AND SIGNS OR SYMPTOMS TO REPORT TO CLINIC OR PRIMARY PHYSICIAN:    MEDICATIONS PRESCRIBED:    INSTRUCTIONS GIVEN AND DISCUSSED:   SPECIAL INSTRUCTIONS/FOLLOW-UP:   Thank you for choosing [facility] to provide your oncology and hematology care.  To afford each patient quality time with our providers, please arrive at least 15 minutes before your scheduled appointment time.  With your help, our goal is to use those 15 minutes to complete the necessary work-up to ensure our physicians have the information they need to help with your evaluation and healthcare recommendations.     Again, thank you for choosing [facility].  Our hope is that these requests will decrease the amount of time that you wait before being seen by our physicians.       _____________________________________________________________  Should you have questions after your visit to [facility], please contact our office at [phone]  between the hours of 8:30 a.m. and 4:30 p.m.  Voicemails left after 4:00 p.m. will not be returned until the following business day.  For prescription refill requests, have your pharmacy contact our office with your prescription refill request.  We respectfully request 24 hour notice for refills.

## 2014-02-08 NOTE — Telephone Encounter (Signed)
gv adn printed appt sched adn advs for pt for Jan 2016

## 2014-02-08 NOTE — Progress Notes (Signed)
  [facility] OFFICE PROGRESS NOTE   Diagnosis: Non-Hodgkin's lymphoma  INTERVAL HISTORY:   He returns as scheduled. He feels well. He had angina earlier this year and was evaluated by cardiology. No palpable lymph nodes. Good appetite.  Objective:  Vital signs in last 24 hours:  Blood pressure 97/51, pulse 68, temperature 97.5 F (36.4 C), temperature source Oral, resp. rate 18, height 5\' 11"  (1.803 m), weight 216 lb 11.2 oz (98.294 kg), SpO2 97.00%.    HEENT: Neck without mass Lymphatics: No cervical, supraclavicular, axillary, or inguinal nodes, prominent bilateral axillary fat pad Resp: Mild bilateral wheeze, no respiratory distress Cardio: Regular rate and rhythm, 2/6 systolic murmur GI: No hepatomegaly, nontender, no mass Vascular: No leg  Medications: I have reviewed the patient's current medications.  Assessment/Plan: 1. 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 Neulasta support was started beginning with cycle #2. He received Zinecard with the 3 cycles of CHOP/Rituxan. The last cycle of chemotherapy was given in May of 2010.  a. A restaging PET scan 02/20/2009 indicated marked improvement in the hypermetabolic lymphadenopathy.  b. A CT of the abdomen and pelvis in October 2010 revealed no evidence for disease progression.  2. History of severe neutropenia following cycle #1 of CHOP/Rituxan.  3. Non-Hodgkin lymphoma diagnosed in September 2000 with clinical remission following CHOP chemotherapy.  4. Diabetes.  5. 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  6. Abdominal aortic aneurysm noted on a CT in October 2010.   Disposition:  [name] remains in clinical remission from non-Hodgkin's lymphoma. He would like to continue followup at the  [name]. He will return for an office visit in 9 months.  Ladell Pier, MD  02/08/2014  9:39 AM

## 2014-03-13 ENCOUNTER — Inpatient Hospital Stay (HOSPITAL_COMMUNITY)
Admission: EM | Admit: 2014-03-13 | Discharge: 2014-03-15 | DRG: 312 | Disposition: A | Discharge status: Home / Self Care | Payer: Medicare Other | Attending: Internal Medicine | Admitting: Internal Medicine

## 2014-03-13 ENCOUNTER — Encounter (HOSPITAL_COMMUNITY): Payer: Self-pay | Admitting: Emergency Medicine

## 2014-03-13 DIAGNOSIS — I350 Nonrheumatic aortic (valve) stenosis: Secondary | ICD-10-CM

## 2014-03-13 DIAGNOSIS — E1149 Type 2 diabetes mellitus with other diabetic neurological complication: Secondary | ICD-10-CM | POA: Diagnosis present

## 2014-03-13 DIAGNOSIS — J441 Chronic obstructive pulmonary disease with (acute) exacerbation: Secondary | ICD-10-CM | POA: Diagnosis present

## 2014-03-13 DIAGNOSIS — Z87891 Personal history of nicotine dependence: Secondary | ICD-10-CM

## 2014-03-13 DIAGNOSIS — N183 Chronic kidney disease, stage 3 unspecified: Secondary | ICD-10-CM | POA: Diagnosis present

## 2014-03-13 DIAGNOSIS — I129 Hypertensive chronic kidney disease with stage 1 through stage 4 chronic kidney disease, or unspecified chronic kidney disease: Secondary | ICD-10-CM | POA: Diagnosis present

## 2014-03-13 DIAGNOSIS — R918 Other nonspecific abnormal finding of lung field: Secondary | ICD-10-CM

## 2014-03-13 DIAGNOSIS — N400 Enlarged prostate without lower urinary tract symptoms: Secondary | ICD-10-CM | POA: Diagnosis present

## 2014-03-13 DIAGNOSIS — I2510 Atherosclerotic heart disease of native coronary artery without angina pectoris: Secondary | ICD-10-CM

## 2014-03-13 DIAGNOSIS — J9819 Other pulmonary collapse: Secondary | ICD-10-CM | POA: Diagnosis present

## 2014-03-13 DIAGNOSIS — E1142 Type 2 diabetes mellitus with diabetic polyneuropathy: Secondary | ICD-10-CM | POA: Diagnosis present

## 2014-03-13 DIAGNOSIS — R222 Localized swelling, mass and lump, trunk: Secondary | ICD-10-CM

## 2014-03-13 DIAGNOSIS — C3410 Malignant neoplasm of upper lobe, unspecified bronchus or lung: Secondary | ICD-10-CM | POA: Diagnosis present

## 2014-03-13 DIAGNOSIS — I10 Essential (primary) hypertension: Secondary | ICD-10-CM | POA: Diagnosis present

## 2014-03-13 DIAGNOSIS — C3490 Malignant neoplasm of unspecified part of unspecified bronchus or lung: Secondary | ICD-10-CM

## 2014-03-13 DIAGNOSIS — Z87898 Personal history of other specified conditions: Secondary | ICD-10-CM

## 2014-03-13 DIAGNOSIS — F1910 Other psychoactive substance abuse, uncomplicated: Secondary | ICD-10-CM | POA: Diagnosis present

## 2014-03-13 DIAGNOSIS — R634 Abnormal weight loss: Secondary | ICD-10-CM | POA: Diagnosis present

## 2014-03-13 DIAGNOSIS — R55 Syncope and collapse: Principal | ICD-10-CM | POA: Diagnosis present

## 2014-03-13 DIAGNOSIS — Z794 Long term (current) use of insulin: Secondary | ICD-10-CM

## 2014-03-13 DIAGNOSIS — R011 Cardiac murmur, unspecified: Secondary | ICD-10-CM | POA: Diagnosis present

## 2014-03-13 DIAGNOSIS — C8589 Other specified types of non-Hodgkin lymphoma, extranodal and solid organ sites: Secondary | ICD-10-CM | POA: Diagnosis present

## 2014-03-13 DIAGNOSIS — J9600 Acute respiratory failure, unspecified whether with hypoxia or hypercapnia: Secondary | ICD-10-CM | POA: Diagnosis present

## 2014-03-13 DIAGNOSIS — C8590 Non-Hodgkin lymphoma, unspecified, unspecified site: Secondary | ICD-10-CM | POA: Diagnosis present

## 2014-03-13 DIAGNOSIS — I739 Peripheral vascular disease, unspecified: Secondary | ICD-10-CM | POA: Diagnosis present

## 2014-03-13 DIAGNOSIS — E785 Hyperlipidemia, unspecified: Secondary | ICD-10-CM | POA: Diagnosis present

## 2014-03-13 DIAGNOSIS — G609 Hereditary and idiopathic neuropathy, unspecified: Secondary | ICD-10-CM | POA: Diagnosis present

## 2014-03-13 DIAGNOSIS — I959 Hypotension, unspecified: Secondary | ICD-10-CM | POA: Diagnosis present

## 2014-03-13 DIAGNOSIS — Z7982 Long term (current) use of aspirin: Secondary | ICD-10-CM

## 2014-03-13 DIAGNOSIS — R404 Transient alteration of awareness: Secondary | ICD-10-CM | POA: Diagnosis present

## 2014-03-13 LAB — BASIC METABOLIC PANEL
BUN: 17 mg/dL (ref 6–23)
CHLORIDE: 94 meq/L — AB (ref 96–112)
CO2: 23 mEq/L (ref 19–32)
Calcium: 10.2 mg/dL (ref 8.4–10.5)
Creatinine, Ser: 1.39 mg/dL — ABNORMAL HIGH (ref 0.50–1.35)
GFR calc Af Amer: 54 mL/min — ABNORMAL LOW (ref >90)
GFR, EST NON AFRICAN AMERICAN: 47 mL/min — AB (ref >90)
Glucose, Bld: 238 mg/dL — ABNORMAL HIGH (ref 70–99)
POTASSIUM: 4.2 meq/L (ref 3.7–5.3)
SODIUM: 132 meq/L — AB (ref 137–147)

## 2014-03-13 LAB — CBC
HCT: 36.9 % — ABNORMAL LOW (ref 39.0–52.0)
HEMOGLOBIN: 12.3 g/dL — AB (ref 13.0–17.0)
MCH: 28.6 pg (ref 26.0–34.0)
MCHC: 33.3 g/dL (ref 30.0–36.0)
MCV: 85.8 fL (ref 78.0–100.0)
Platelets: 229 10*3/uL (ref 150–400)
RBC: 4.3 MIL/uL (ref 4.22–5.81)
RDW: 15 % (ref 11.5–15.5)
WBC: 5.7 10*3/uL (ref 4.0–10.5)

## 2014-03-13 NOTE — ED Provider Notes (Signed)
CSN: [id number]     Arrival date & time 03/13/14  2154 History   First MD Initiated Contact with Patient 03/13/14 2342     Chief Complaint  Patient presents with  . Loss of Consciousness  . Shortness of Breath     (Consider location/radiation/quality/duration/timing/severity/associated sxs/prior Treatment) HPI Patient is a 80 yo man with a history of CAD, COPD, PVD, HTN, HLD. He lives alone and is BIB his family via [place]. Patient had an episode of syncope around 1430h. Event was witnessed by grandson. Patient had recently eaten lunch and was  standing up playing "patty cake" with his great grandson who was being held by another person. The patient then seemed to collapse and fall backward. Grandson caught him but the patient also struck posterior right shoulder against a TV cabinet. The patient believes that he lost conciousness. He recalls having felt lightheaded and near syncopal.   Yolanda Bonine does not know if the patient had LOC. Patient was able to ambulate to sit down and then later walked to his grandson's car and was transported back home. GS checked BP once they got back to the patient's house and it was 90/50.  Patient's dtr reports that the patient has had numerous "spells" like this in the past few months. She has not witnessed the events but they have been described to her by her brother and mother.   The patient is currently wheezing and endorses SOB. However, he says he did not feel SOB or have chest pain prior to this evening's events. He denies any recent med changes. However, he held this evening's dose of Lisinopril  Past Medical History  Diagnosis Date  . Peripheral arterial disease   . Diabetes mellitus without complication   . CAD (coronary artery disease)   . Hyperlipidemia   . Hypertension   . Non Hodgkin's lymphoma   . Peripheral neuropathy   . COPD (chronic obstructive pulmonary disease)   . History of lower GI bleeding   . Substance abuse    Past Surgical History   Procedure Laterality Date  . Fracture surgery  2009    Left ankle  . Pr vein bypass graft,aorto-fem-pop  2005    Right common femoral to BK popliteal BPG  . Pr vein bypass graft,aorto-fem-pop  01-27-05    Revision of Right fem-pop BPG  . Cardiac catheterization  1990's   Family History  Problem Relation Age of Onset  . Cancer Mother     Colon  . Heart attack Mother   . Stroke Father   . Hyperlipidemia Father   . Hyperlipidemia Sister   . Hyperlipidemia Brother   . Hyperlipidemia Daughter   . Hypertension Son    History  Substance Use Topics  . Smoking status: Former Smoker    Types: Cigarettes    Quit date: 10/29/2003  . Smokeless tobacco: Never Used  . Alcohol Use: No    Review of Systems Ten point review of symptoms performed and is negative with the exception of symptoms noted above.      Allergies  Review of patient's allergies indicates no known allergies.  Home Medications   Prior to Admission medications   Medication Sig Start Date End Date Taking? Authorizing Provider  Acetaminophen (TYLENOL ARTHRITIS PAIN PO) Take 650 mg by mouth 2 (two) times daily.    Yes Historical Provider, MD  aspirin 81 MG tablet Take 160 mg by mouth 2 (two) times daily.   Yes Historical Provider, MD  CINNAMON PO Take 1,000  mg by mouth 2 (two) times daily.   Yes Historical Provider, MD  DULoxetine (CYMBALTA) 30 MG capsule Take 30 mg by mouth daily. 04/27/13  Yes Historical Provider, MD  fenofibrate micronized (LOFIBRA) 200 MG capsule Take 200 mg by mouth daily before breakfast.   Yes Historical Provider, MD  finasteride (PROSCAR) 5 MG tablet Take 5 mg by mouth daily.   Yes Historical Provider, MD  gabapentin (NEURONTIN) 600 MG tablet Take 600 mg by mouth 3 (three) times daily.    Yes Historical Provider, MD  glucosamine-chondroitin 500-400 MG tablet Take 1 tablet by mouth 2 (two) times daily.   Yes Historical Provider, MD  HUMALOG MIX 75/25 KWIKPEN (75-25) 100 UNIT/ML SUPN Inject 18-30  Units into the skin 2 (two) times daily. 30 units in the morning and 18 in the evening 04/27/13  Yes Historical Provider, MD  Insulin Glulisine (APIDRA SOLOSTAR) 100 UNIT/ML Solostar Pen Inject 3-7 Units into the skin See admin instructions. Prn over 250   Yes Historical Provider, MD  isosorbide mononitrate (IMDUR) 60 MG 24 hr tablet Take 1 tablet (60 mg total) by mouth daily. 01/06/14  Yes Mihai Croitoru, MD  lisinopril (PRINIVIL,ZESTRIL) 20 MG tablet Take 10 mg by mouth daily.   Yes Historical Provider, MD  metFORMIN (GLUCOPHAGE) 1000 MG tablet Take 1,000 mg by mouth 2 (two) times daily with a meal.   Yes Historical Provider, MD  metoprolol tartrate (LOPRESSOR) 25 MG tablet Take 1 tablet (25 mg total) by mouth 2 (two) times daily. 11/24/13  Yes Mihai Croitoru, MD  nitroGLYCERIN (NITROLINGUAL) 0.4 MG/SPRAY spray Place 1 spray under the tongue every 5 (five) minutes x 3 doses as needed for chest pain. 11/24/13  Yes Mihai Croitoru, MD  pioglitazone (ACTOS) 30 MG tablet Take 30 mg by mouth daily.   Yes Historical Provider, MD  rosuvastatin (CRESTOR) 10 MG tablet Take 10 mg by mouth at bedtime.    Yes Historical Provider, MD  Tamsulosin HCl (FLOMAX) 0.4 MG CAPS Take 0.4 mg by mouth daily.   Yes Historical Provider, MD   BP 115/58  Pulse 61  Temp(Src) 97.6 F (36.4 C) (Oral)  Resp 18  SpO2 100% Physical Exam Gen: well developed and well nourished appearing Head: NCAT Eyes: PERL, EOMI Nose: no epistaixis or rhinorrhea Mouth/throat: mucosa is mildly dehydrated appearing, and pink Neck: supple, no stridor Lungs: CTA B, no wheezing, rhonchi or rales CV: RRR, no murmur, extremities appear well perfused.  Abd: soft, notender, nondistended Back: no ttp, no cva ttp Skin: warm and dry Ext: normal to inspection, no dependent edema Neuro: CN ii-xii grossly intact, no focal deficits Psyche; normal affect,  calm and cooperative.   ED Course  Procedures (including critical care time) Labs  Review  Results for orders placed during the hospital encounter of 03/13/14 (from the past 24 hour(s))  CBC     Status: Abnormal   Collection Time    03/13/14 10:12 PM      Result Value Ref Range   WBC 5.7  4.0 - 10.5 K/uL   RBC 4.30  4.22 - 5.81 MIL/uL   Hemoglobin 12.3 (*) 13.0 - 17.0 g/dL   HCT 36.9 (*) 39.0 - 52.0 %   MCV 85.8  78.0 - 100.0 fL   MCH 28.6  26.0 - 34.0 pg   MCHC 33.3  30.0 - 36.0 g/dL   RDW 15.0  11.5 - 15.5 %   Platelets 229  150 - 400 K/uL  BASIC METABOLIC PANEL  Status: Abnormal   Collection Time    03/13/14 10:12 PM      Result Value Ref Range   Sodium 132 (*) 137 - 147 mEq/L   Potassium 4.2  3.7 - 5.3 mEq/L   Chloride 94 (*) 96 - 112 mEq/L   CO2 23  19 - 32 mEq/L   Glucose, Bld 238 (*) 70 - 99 mg/dL   BUN 17  6 - 23 mg/dL   Creatinine, Ser 1.39 (*) 0.50 - 1.35 mg/dL   Calcium 10.2  8.4 - 10.5 mg/dL   GFR calc non Af Amer 47 (*) >90 mL/min   GFR calc Af Amer 54 (*) >90 mL/min  I-STAT TROPOININ, ED     Status: None   Collection Time    03/14/14 12:48 AM      Result Value Ref Range   Troponin i, poc 0.01  0.00 - 0.08 ng/mL   Comment 3           I-STAT VENOUS BLOOD GAS, ED     Status: Abnormal   Collection Time    03/14/14 12:50 AM      Result Value Ref Range   pH, Ven 7.386 (*) 7.250 - 7.300   pCO2, Ven 47.0  45.0 - 50.0 mmHg   pO2, Ven 28.0 (*) 30.0 - 45.0 mmHg   Bicarbonate 28.2 (*) 20.0 - 24.0 mEq/L   TCO2 30  0 - 100 mmol/L   O2 Saturation 51.0     Acid-Base Excess 3.0 (*) 0.0 - 2.0 mmol/L   Sample type VENOUS     EKG: NSR, left axis deviation, normal intervals, no acute ischemic changes.    DG Chest 2 View (Final result)  Result time: 03/14/14 01:18:44    Final result by Rad Results In Interface (03/14/14 01:18:44)    Narrative:   CLINICAL DATA: LOSS OF CONSCIOUSNESS SHORTNESS OF BREATH  New Renal having  EXAM: CHEST 2 VIEW  COMPARISON: NM PET IMAGE RESTAG (PS) SKULL BASE TO THIGH dated 02/20/2009; DG CHEST 2 VIEW dated  12/19/2007  FINDINGS: Cardiac silhouette within the upper limits normal. Calcified mitral valve annulus. Atherosclerotic calcification in the aorta. A 6 cm consolidative masslike left perihilar density with spiculation. There is volume loss in the left hemi thorax. There is flattening of the right hemidiaphragm and blunting of the right costophrenic angle. The lungs otherwise clear. Degenerative changes within the shoulders.  IMPRESSION: Consolidative masslike density in the left perihilar region concerning for soft tissue mass until proven otherwise. Further evaluation with contrast chest CT recommended.  COPD   Electronically Signed By: Margaree Mackintosh M.D. On: 03/14/2014 01:18     MDM    1. S/p syncope with potential causes including volume depletion, obstructive process secondary to perihilar mass, vasovagal episode cardiac dysrtyhmia. We are treating with IVF. First troponin wnl  2. New left perihilar mass - concerning for recurrent Non-Hodgkin's lymphoma  Hospitalist paged to request admission.     Elyn Peers, MD 03/14/14 0130

## 2014-03-13 NOTE — ED Notes (Signed)
Pt reports generalized weakness with SOB x 3 weeks. States today "I got so weak that I just passed out." Reports LOC. Denies injury. Pt now AO x4. VSS. Per daughter, BP was noted to be 90/70 after incident.

## 2014-03-14 ENCOUNTER — Inpatient Hospital Stay (HOSPITAL_COMMUNITY): Payer: Medicare Other

## 2014-03-14 ENCOUNTER — Emergency Department (HOSPITAL_COMMUNITY): Payer: Medicare Other

## 2014-03-14 ENCOUNTER — Encounter (HOSPITAL_COMMUNITY): Payer: Self-pay | Admitting: Internal Medicine

## 2014-03-14 ENCOUNTER — Encounter (HOSPITAL_COMMUNITY)
Admission: EM | Disposition: A | Discharge status: Home / Self Care | Payer: Self-pay | Source: Home / Self Care | Attending: Family Medicine

## 2014-03-14 DIAGNOSIS — R222 Localized swelling, mass and lump, trunk: Secondary | ICD-10-CM

## 2014-03-14 DIAGNOSIS — R918 Other nonspecific abnormal finding of lung field: Secondary | ICD-10-CM | POA: Diagnosis present

## 2014-03-14 DIAGNOSIS — J441 Chronic obstructive pulmonary disease with (acute) exacerbation: Secondary | ICD-10-CM | POA: Diagnosis present

## 2014-03-14 DIAGNOSIS — R55 Syncope and collapse: Principal | ICD-10-CM | POA: Diagnosis present

## 2014-03-14 DIAGNOSIS — I359 Nonrheumatic aortic valve disorder, unspecified: Secondary | ICD-10-CM

## 2014-03-14 DIAGNOSIS — C8590 Non-Hodgkin lymphoma, unspecified, unspecified site: Secondary | ICD-10-CM | POA: Diagnosis present

## 2014-03-14 DIAGNOSIS — C8589 Other specified types of non-Hodgkin lymphoma, extranodal and solid organ sites: Secondary | ICD-10-CM

## 2014-03-14 HISTORY — PX: VIDEO BRONCHOSCOPY: SHX5072

## 2014-03-14 LAB — BASIC METABOLIC PANEL
BUN: 18 mg/dL (ref 6–23)
CALCIUM: 10 mg/dL (ref 8.4–10.5)
CO2: 23 mEq/L (ref 19–32)
CREATININE: 1.08 mg/dL (ref 0.50–1.35)
Chloride: 97 mEq/L (ref 96–112)
GFR calc non Af Amer: 64 mL/min — ABNORMAL LOW (ref >90)
GFR, EST AFRICAN AMERICAN: 74 mL/min — AB (ref >90)
Glucose, Bld: 252 mg/dL — ABNORMAL HIGH (ref 70–99)
Potassium: 4.2 mEq/L (ref 3.7–5.3)
Sodium: 134 mEq/L — ABNORMAL LOW (ref 137–147)

## 2014-03-14 LAB — I-STAT VENOUS BLOOD GAS, ED
ACID-BASE EXCESS: 3 mmol/L — AB (ref 0.0–2.0)
Bicarbonate: 28.2 mEq/L — ABNORMAL HIGH (ref 20.0–24.0)
O2 Saturation: 51 %
TCO2: 30 mmol/L (ref 0–100)
pCO2, Ven: 47 mmHg (ref 45.0–50.0)
pH, Ven: 7.386 — ABNORMAL HIGH (ref 7.250–7.300)
pO2, Ven: 28 mmHg — CL (ref 30.0–45.0)

## 2014-03-14 LAB — CBC
HCT: 36.1 % — ABNORMAL LOW (ref 39.0–52.0)
Hemoglobin: 11.7 g/dL — ABNORMAL LOW (ref 13.0–17.0)
MCH: 27.9 pg (ref 26.0–34.0)
MCHC: 32.4 g/dL (ref 30.0–36.0)
MCV: 86.2 fL (ref 78.0–100.0)
PLATELETS: 215 10*3/uL (ref 150–400)
RBC: 4.19 MIL/uL — ABNORMAL LOW (ref 4.22–5.81)
RDW: 15 % (ref 11.5–15.5)
WBC: 6.8 10*3/uL (ref 4.0–10.5)

## 2014-03-14 LAB — GLUCOSE, CAPILLARY
Glucose-Capillary: 250 mg/dL — ABNORMAL HIGH (ref 70–99)
Glucose-Capillary: 304 mg/dL — ABNORMAL HIGH (ref 70–99)
Glucose-Capillary: 171 mg/dL — ABNORMAL HIGH (ref 70–99)
Glucose-Capillary: 232 mg/dL — ABNORMAL HIGH (ref 70–99)

## 2014-03-14 LAB — URINALYSIS, ROUTINE W REFLEX MICROSCOPIC
Bilirubin Urine: NEGATIVE
Glucose, UA: 250 mg/dL — AB
Hgb urine dipstick: NEGATIVE
KETONES UR: NEGATIVE mg/dL
Leukocytes, UA: NEGATIVE
NITRITE: NEGATIVE
Protein, ur: NEGATIVE mg/dL
Specific Gravity, Urine: 1.016 (ref 1.005–1.030)
Urobilinogen, UA: 0.2 mg/dL (ref 0.0–1.0)
pH: 5 (ref 5.0–8.0)

## 2014-03-14 LAB — I-STAT TROPONIN, ED: Troponin i, poc: 0.01 ng/mL (ref 0.00–0.08)

## 2014-03-14 SURGERY — BRONCHOSCOPY, WITH FLUOROSCOPY
Anesthesia: Moderate Sedation | Laterality: Bilateral

## 2014-03-14 MED ORDER — DOXYCYCLINE HYCLATE 100 MG PO TABS
100.0000 mg | ORAL_TABLET | Freq: Two times a day (BID) | ORAL | Status: DC
  Administered 2014-03-14 – 2014-03-15 (×3): 100 mg via ORAL
  Filled 2014-03-14 (×4): qty 1

## 2014-03-14 MED ORDER — ASPIRIN 81 MG PO TABS: 160.0000 mg | ORAL_TABLET | Freq: Two times a day (BID) | ORAL | Status: DC

## 2014-03-14 MED ORDER — TAMSULOSIN HCL 0.4 MG PO CAPS
0.4000 mg | ORAL_CAPSULE | Freq: Every day | ORAL | Status: DC
  Administered 2014-03-14 – 2014-03-15 (×2): 0.4 mg via ORAL
  Filled 2014-03-14 (×2): qty 1

## 2014-03-14 MED ORDER — ENOXAPARIN SODIUM 40 MG/0.4ML ~~LOC~~ SOLN
40.0000 mg | SUBCUTANEOUS | Status: DC
  Administered 2014-03-14 – 2014-03-15 (×2): 40 mg via SUBCUTANEOUS
  Filled 2014-03-14 (×2): qty 0.4

## 2014-03-14 MED ORDER — IPRATROPIUM-ALBUTEROL 0.5-2.5 (3) MG/3ML IN SOLN
3.0000 mL | Freq: Three times a day (TID) | RESPIRATORY_TRACT | Status: DC
  Administered 2014-03-15: 3 mL via RESPIRATORY_TRACT
  Filled 2014-03-14: qty 3

## 2014-03-14 MED ORDER — METOPROLOL TARTRATE 25 MG PO TABS
25.0000 mg | ORAL_TABLET | Freq: Two times a day (BID) | ORAL | Status: DC
  Administered 2014-03-14 – 2014-03-15 (×3): 25 mg via ORAL
  Filled 2014-03-14 (×4): qty 1

## 2014-03-14 MED ORDER — LIDOCAINE HCL 2 % EX GEL
Freq: Once | CUTANEOUS | Status: AC
  Filled 2014-03-14: qty 5

## 2014-03-14 MED ORDER — ALBUTEROL SULFATE (2.5 MG/3ML) 0.083% IN NEBU
5.0000 mg | INHALATION_SOLUTION | Freq: Once | RESPIRATORY_TRACT | Status: AC
  Administered 2014-03-14: 5 mg via RESPIRATORY_TRACT
  Filled 2014-03-14: qty 6

## 2014-03-14 MED ORDER — SODIUM CHLORIDE 0.9 % IJ SOLN
3.0000 mL | Freq: Two times a day (BID) | INTRAMUSCULAR | Status: DC
  Administered 2014-03-14 – 2014-03-15 (×2): 3 mL via INTRAVENOUS

## 2014-03-14 MED ORDER — MIDAZOLAM HCL 5 MG/ML IJ SOLN
INTRAMUSCULAR | Status: AC
  Filled 2014-03-14: qty 2

## 2014-03-14 MED ORDER — IOHEXOL 350 MG/ML SOLN
100.0000 mL | Freq: Once | INTRAVENOUS | Status: AC | PRN
  Administered 2014-03-14: 80 mL via INTRAVENOUS

## 2014-03-14 MED ORDER — SODIUM CHLORIDE 0.9 % IV SOLN
INTRAVENOUS | Status: AC
  Administered 2014-03-14: 05:00:00 via INTRAVENOUS

## 2014-03-14 MED ORDER — INSULIN ASPART 100 UNIT/ML ~~LOC~~ SOLN
0.0000 [IU] | Freq: Three times a day (TID) | SUBCUTANEOUS | Status: DC
  Administered 2014-03-14: 3 [IU] via SUBCUTANEOUS
  Administered 2014-03-14: 2 [IU] via SUBCUTANEOUS
  Administered 2014-03-14: 7 [IU] via SUBCUTANEOUS
  Administered 2014-03-15: 3 [IU] via SUBCUTANEOUS
  Administered 2014-03-15: 5 [IU] via SUBCUTANEOUS
  Administered 2014-03-15: 3 [IU] via SUBCUTANEOUS

## 2014-03-14 MED ORDER — IPRATROPIUM BROMIDE 0.02 % IN SOLN
0.5000 mg | Freq: Four times a day (QID) | RESPIRATORY_TRACT | Status: DC
  Administered 2014-03-14 (×2): 0.5 mg via RESPIRATORY_TRACT
  Filled 2014-03-14 (×3): qty 2.5

## 2014-03-14 MED ORDER — ACETAMINOPHEN 650 MG RE SUPP: 650.0000 mg | Freq: Four times a day (QID) | RECTAL | Status: DC | PRN

## 2014-03-14 MED ORDER — MIDAZOLAM HCL 10 MG/2ML IJ SOLN
INTRAMUSCULAR | Status: DC | PRN
  Administered 2014-03-14: 1 mg via INTRAVENOUS

## 2014-03-14 MED ORDER — PHENYLEPHRINE HCL 0.25 % NA SOLN
NASAL | Status: DC | PRN
  Administered 2014-03-14: 2 via NASAL

## 2014-03-14 MED ORDER — PHENYLEPHRINE HCL 0.25 % NA SOLN
1.0000 | Freq: Four times a day (QID) | NASAL | Status: DC | PRN
  Filled 2014-03-14: qty 15

## 2014-03-14 MED ORDER — ONDANSETRON HCL 4 MG PO TABS: 4.0000 mg | ORAL_TABLET | Freq: Four times a day (QID) | ORAL | Status: DC | PRN

## 2014-03-14 MED ORDER — ALBUTEROL SULFATE (2.5 MG/3ML) 0.083% IN NEBU
2.5000 mg | INHALATION_SOLUTION | RESPIRATORY_TRACT | Status: DC | PRN
  Administered 2014-03-14: 2.5 mg via RESPIRATORY_TRACT
  Filled 2014-03-14: qty 3

## 2014-03-14 MED ORDER — FENTANYL CITRATE 0.05 MG/ML IJ SOLN
INTRAMUSCULAR | Status: AC
  Filled 2014-03-14: qty 4

## 2014-03-14 MED ORDER — GUAIFENESIN-DM 100-10 MG/5ML PO SYRP: 5.0000 mL | ORAL_SOLUTION | ORAL | Status: DC | PRN

## 2014-03-14 MED ORDER — PREDNISONE 20 MG PO TABS
60.0000 mg | ORAL_TABLET | Freq: Once | ORAL | Status: AC
  Administered 2014-03-14: 60 mg via ORAL
  Filled 2014-03-14: qty 3

## 2014-03-14 MED ORDER — INSULIN ASPART PROT & ASPART (70-30 MIX) 100 UNIT/ML ~~LOC~~ SUSP
30.0000 [IU] | Freq: Every day | SUBCUTANEOUS | Status: DC
  Administered 2014-03-14 – 2014-03-15 (×2): 30 [IU] via SUBCUTANEOUS
  Filled 2014-03-14: qty 10

## 2014-03-14 MED ORDER — FENTANYL CITRATE 0.05 MG/ML IJ SOLN
INTRAMUSCULAR | Status: DC | PRN
  Administered 2014-03-14 (×2): 25 ug via INTRAVENOUS

## 2014-03-14 MED ORDER — LIDOCAINE HCL 2 % EX GEL
CUTANEOUS | Status: DC | PRN
  Administered 2014-03-14: 1

## 2014-03-14 MED ORDER — LIDOCAINE HCL 2 % EX GEL
Freq: Once | CUTANEOUS | Status: DC
  Filled 2014-03-14: qty 5

## 2014-03-14 MED ORDER — INSULIN ASPART PROT & ASPART (70-30 MIX) 100 UNIT/ML ~~LOC~~ SUSP
18.0000 [IU] | Freq: Every day | SUBCUTANEOUS | Status: DC
Start: 2014-03-14 — End: 2014-03-15
  Administered 2014-03-14 – 2014-03-15 (×2): 18 [IU] via SUBCUTANEOUS
  Filled 2014-03-14: qty 10

## 2014-03-14 MED ORDER — ACETAMINOPHEN 325 MG PO TABS: 650.0000 mg | ORAL_TABLET | Freq: Four times a day (QID) | ORAL | Status: DC | PRN

## 2014-03-14 MED ORDER — ATORVASTATIN CALCIUM 20 MG PO TABS
20.0000 mg | ORAL_TABLET | Freq: Every day | ORAL | Status: DC
  Administered 2014-03-14 – 2014-03-15 (×2): 20 mg via ORAL
  Filled 2014-03-14 (×2): qty 1

## 2014-03-14 MED ORDER — LISINOPRIL 10 MG PO TABS
10.0000 mg | ORAL_TABLET | Freq: Every day | ORAL | Status: DC
  Administered 2014-03-14 – 2014-03-15 (×2): 10 mg via ORAL
  Filled 2014-03-14 (×2): qty 1

## 2014-03-14 MED ORDER — FINASTERIDE 5 MG PO TABS
5.0000 mg | ORAL_TABLET | Freq: Every day | ORAL | Status: DC
  Administered 2014-03-14 – 2014-03-15 (×2): 5 mg via ORAL
  Filled 2014-03-14 (×2): qty 1

## 2014-03-14 MED ORDER — PIOGLITAZONE HCL 30 MG PO TABS
30.0000 mg | ORAL_TABLET | Freq: Every day | ORAL | Status: DC
  Administered 2014-03-14 – 2014-03-15 (×2): 30 mg via ORAL
  Filled 2014-03-14 (×2): qty 1

## 2014-03-14 MED ORDER — PREDNISONE 50 MG PO TABS
60.0000 mg | ORAL_TABLET | Freq: Every day | ORAL | Status: DC
  Administered 2014-03-15: 60 mg via ORAL
  Filled 2014-03-14 (×2): qty 1

## 2014-03-14 MED ORDER — GABAPENTIN 600 MG PO TABS
600.0000 mg | ORAL_TABLET | Freq: Three times a day (TID) | ORAL | Status: DC
  Administered 2014-03-14 – 2014-03-15 (×5): 600 mg via ORAL
  Filled 2014-03-14 (×6): qty 1

## 2014-03-14 MED ORDER — ALBUTEROL SULFATE (2.5 MG/3ML) 0.083% IN NEBU
2.5000 mg | INHALATION_SOLUTION | Freq: Four times a day (QID) | RESPIRATORY_TRACT | Status: DC
  Administered 2014-03-14 (×2): 2.5 mg via RESPIRATORY_TRACT
  Filled 2014-03-14 (×3): qty 3

## 2014-03-14 MED ORDER — INSULIN LISPRO PROT & LISPRO (75-25 MIX) 100 UNIT/ML KWIKPEN: 18.0000 [IU] | PEN_INJECTOR | Freq: Two times a day (BID) | SUBCUTANEOUS | Status: DC

## 2014-03-14 MED ORDER — ONDANSETRON HCL 4 MG/2ML IJ SOLN: 4.0000 mg | Freq: Four times a day (QID) | INTRAMUSCULAR | Status: DC | PRN

## 2014-03-14 MED ORDER — DULOXETINE HCL 30 MG PO CPEP
30.0000 mg | ORAL_CAPSULE | Freq: Every day | ORAL | Status: DC
  Administered 2014-03-14 – 2014-03-15 (×2): 30 mg via ORAL
  Filled 2014-03-14 (×2): qty 1

## 2014-03-14 MED ORDER — IPRATROPIUM-ALBUTEROL 0.5-2.5 (3) MG/3ML IN SOLN: 3.0000 mL | Freq: Three times a day (TID) | RESPIRATORY_TRACT | Status: DC

## 2014-03-14 MED ORDER — SODIUM CHLORIDE 0.9 % IV SOLN
INTRAVENOUS | Status: DC
  Administered 2014-03-14: 14:00:00 via INTRAVENOUS

## 2014-03-14 MED ORDER — BUTAMBEN-TETRACAINE-BENZOCAINE 2-2-14 % EX AERO
1.0000 | INHALATION_SPRAY | Freq: Once | CUTANEOUS | Status: AC
  Filled 2014-03-14: qty 56

## 2014-03-14 MED ORDER — FENOFIBRATE 160 MG PO TABS
160.0000 mg | ORAL_TABLET | Freq: Every day | ORAL | Status: DC
  Administered 2014-03-14 – 2014-03-15 (×2): 160 mg via ORAL
  Filled 2014-03-14 (×2): qty 1

## 2014-03-14 MED ORDER — ISOSORBIDE MONONITRATE ER 60 MG PO TB24
60.0000 mg | ORAL_TABLET | Freq: Every day | ORAL | Status: DC
  Administered 2014-03-14 – 2014-03-15 (×2): 60 mg via ORAL
  Filled 2014-03-14 (×2): qty 1

## 2014-03-14 MED ORDER — METHYLPREDNISOLONE SODIUM SUCC 40 MG IJ SOLR
40.0000 mg | Freq: Three times a day (TID) | INTRAMUSCULAR | Status: AC
  Administered 2014-03-14 (×2): 40 mg via INTRAVENOUS
  Filled 2014-03-14 (×2): qty 1

## 2014-03-14 MED ORDER — LIDOCAINE HCL 1 % IJ SOLN
INTRAMUSCULAR | Status: DC | PRN
  Administered 2014-03-14: 6 mL via RESPIRATORY_TRACT

## 2014-03-14 MED ORDER — ASPIRIN 81 MG PO CHEW
162.0000 mg | CHEWABLE_TABLET | Freq: Two times a day (BID) | ORAL | Status: DC
  Administered 2014-03-14 – 2014-03-15 (×3): 162 mg via ORAL
  Filled 2014-03-14 (×3): qty 2

## 2014-03-14 MED ORDER — INSULIN ASPART 100 UNIT/ML ~~LOC~~ SOLN
0.0000 [IU] | Freq: Every day | SUBCUTANEOUS | Status: DC
  Administered 2014-03-14: 2 [IU] via SUBCUTANEOUS

## 2014-03-14 NOTE — Evaluation (Signed)
Physical Therapy Evaluation Patient Details Name: VOLLIE BRUNTY MRN: [id number] DOB: 05-18-1933 Today's Date: 03/14/2014   History of Present Illness  Pt adm after syncopal episode at home.  Clinical Impression  Pt doing well with mobility and no further PT needed.  Ready for dc from PT standpoint.      Follow Up Recommendations No PT follow up    Equipment Recommendations  None recommended by PT    Recommendations for Other Services       Precautions / Restrictions Precautions Precautions: None      Mobility  Bed Mobility                  Transfers Overall transfer level: Independent                  Ambulation/Gait Ambulation/Gait assistance: Independent Ambulation Distance (Feet): 300 Feet Assistive device: None Gait Pattern/deviations: WFL(Within Functional Limits)        Stairs            Wheelchair Mobility    Modified Rankin (Stroke Patients Only)       Balance Overall balance assessment: No apparent balance deficits (not formally assessed)                                           Pertinent Vitals/Pain See flow sheet    Home Living Family/patient expects to be discharged to:: Private residence Living Arrangements: Alone   Type of Home: Mobile home Home Access: Ramped entrance     Home Layout: One level Home Equipment: None      Prior Function Level of Independence: Independent               Hand Dominance        Extremity/Trunk Assessment   Upper Extremity Assessment: Overall WFL for tasks assessed           Lower Extremity Assessment: Overall WFL for tasks assessed         Communication   Communication: No difficulties  Cognition Arousal/Alertness: Awake/alert Behavior During Therapy: WFL for tasks assessed/performed Overall Cognitive Status: Within Functional Limits for tasks assessed                      General Comments      Exercises         Assessment/Plan    PT Assessment Patent does not need any further PT services  PT Diagnosis     PT Problem List    PT Treatment Interventions     PT Goals (Current goals can be found in the Care Plan section) Acute Rehab PT Goals PT Goal Formulation: No goals set, d/c therapy    Frequency     Barriers to discharge        Co-evaluation               End of Session Equipment Utilized During Treatment: Gait belt Activity Tolerance: Patient tolerated treatment well Patient left: in bed;with call bell/phone within reach;with bed alarm set Nurse Communication: Mobility status         Time: 4010-2725 PT Time Calculation (min): 23 min   Charges:   PT Evaluation $Initial PT Evaluation Tier I: 1 Procedure PT Treatments $Gait Training: 8-22 mins   PT G CodesShary Decamp [facility] 03/14/2014, 12:31 PM  [employer] PT [phone]

## 2014-03-14 NOTE — ED Notes (Signed)
Pt taken to CT.

## 2014-03-14 NOTE — Care Management Note (Unsigned)
    Page 1 of 1   03/14/2014     1:52:42 PM CARE MANAGEMENT NOTE 03/14/2014  Patient:  [name] [name], [name] [name]   Account Number:  [id number]  Date Initiated:  03/14/2014  Documentation initiated by:  Reggie Welge  Subjective/Objective Assessment:   Pt adm on 5/17 with syncope, SOB, LUL lung mass.  PTA, pt lives alone, but has supportive family.     Action/Plan:   Will follow for dc needs as pt progresses.  PT eval today; recommending no follow up at dc.   Anticipated DC Date:  03/16/2014   Anticipated DC Plan:  [place]  CM consult      Choice offered to / List presented to:             Status of service:  In process, will continue to follow Medicare Important Message given?   (If response is "NO", the following Medicare IM given date fields will be blank) Date Medicare IM given:   Date Additional Medicare IM given:    Discharge Disposition:    Per UR Regulation:  Reviewed for med. necessity/level of care/duration of stay  If discussed at [place] of Stay Meetings, dates discussed:    Comments:

## 2014-03-14 NOTE — Progress Notes (Signed)
Patient seen and evaluated earlier this AM by my associate. Discussed CT scan of chest results with patient.  Also consulted Pulmonology and Oncology.  Oncology to evaluate next am 03/15/14.  We will reassess next am.  [name] [name]  Pt is a 80 y/o with h/o COPD, CAD, PVD, HTN, HPL, non-hodgkin's lymphoma who presented to the ED for evaluation after patient had loss of consciousness.  CT scan of chest would reveal apparent LUL perihilar mass with complete occlusion of the left upper lobe bronchus.Findings are highly suspicious  for primary bronchogenic malignancy per report.

## 2014-03-14 NOTE — ED Notes (Signed)
Dr Cheri Guppy at bedside.

## 2014-03-14 NOTE — Op Note (Signed)
Indication : LUL mass in this ex smoker. Written informed consent was obtained prior to the procedure. The risks of the procedure including coughing, bleeding and the small chance of lung puncture requiring chest tube were discussed in great detail. The benefits & alternatives including serial follow up were also discussed.  1 mg versed & 50  mcg fentnayl used in divided doses during the procedure Bronchoscope entered from the right nare. Upper airway nml Vocal cords showed nml appearance & motion. Trachea & bronchial tree examined to the subsegmental level. Mild amount of white secretions were noted. Endobronchial lesion seen obstructing take off of LUL & involving the secondary carina.. brushings x 2 were obtained from the LUL . BAL was also obtained from the LUL.  There was moderate coughing  during the procedure. No biopsies were taken due to moderate bleeding with brushings .   Cherisa Brucker V.  [phone]

## 2014-03-14 NOTE — Consult Note (Addendum)
Name: [name] [name] MRN: [id number] DOB: [birth year]    ADMISSION DATE:  03/13/2014 CONSULTATION DATE:  03/14/14  REFERRING MD :  Dr. Wendee Beavers  PRIMARY SERVICE:  TRH   CHIEF COMPLAINT:  Lung Mass   BRIEF PATIENT DESCRIPTION: 80 y/o M with PMH of COPD who was admitted on 5/18 with an episode of syncope.  Also reported 3 wk history of increasing SOB.  Further evaluation noted CT with LUL lung mass.  PCCM consulted for evaluation.   SIGNIFICANT EVENTS / STUDIES:  5/18 - Admit with syncope, 3 wk increasing SOB 5/18 - CT Chest >> neg for PE, LUL perihilar mass with occlusion of LUL bronchus, complete atx of LUL, marked narrowing of L sided pulmonary artery branches.  3 vessel CAD   CULTURES:  ANTIBIOTICS: Doxycycline (AECOPD) 5/18 >>  HISTORY OF PRESENT ILLNESS:  80 y/o M, former smoker, with PMH of DM, CAD, HTN, HLD, PAD, GIB (lower), Substance Abuse, Non-Hodgkins Lymphoma & COPD who presented to the [facility] ER on 5/18 with a three week history of increasing shortness of breath and isolated syncopal episode.  Patient was playing with great-grandson in a non-air conditioned room and suffered a syncopal episode.  He was caught by his grandson.  He endorses 40 lb weight loss over the last year, intermittent cough with minimal sputum production.  Denies hemoptysis, chest pain, pain with inspiration, n/v/d, orthopnea.   ER evaluation noted significant wheezing on initial exam and pt was treated with nebulized bronchodilators & steroids.  Initial troponin negative.  CTA of chest assessed for PE and negative but revealed a LUL obstructing mass with subsequent LUL atelectasis.    PAST MEDICAL HISTORY :  Past Medical History  Diagnosis Date  . Peripheral arterial disease   . Diabetes mellitus without complication   . CAD (coronary artery disease)   . Hyperlipidemia   . Hypertension   . Non Hodgkin's lymphoma   . Peripheral neuropathy   . COPD (chronic obstructive pulmonary disease)   . History of lower  GI bleeding   . Substance abuse    Past Surgical History  Procedure Laterality Date  . Fracture surgery  2009    Left ankle  . Pr vein bypass graft,aorto-fem-pop  2005    Right common femoral to BK popliteal BPG  . Pr vein bypass graft,aorto-fem-pop  01-27-05    Revision of Right fem-pop BPG  . Cardiac catheterization  1990's   Prior to Admission medications   Medication Sig Start Date End Date Taking? Authorizing Provider  Acetaminophen (TYLENOL ARTHRITIS PAIN PO) Take 650 mg by mouth 2 (two) times daily.    Yes Historical Provider, MD  aspirin 81 MG tablet Take 160 mg by mouth 2 (two) times daily.   Yes Historical Provider, MD  CINNAMON PO Take 1,000 mg by mouth 2 (two) times daily.   Yes Historical Provider, MD  DULoxetine (CYMBALTA) 30 MG capsule Take 30 mg by mouth daily. 04/27/13  Yes Historical Provider, MD  fenofibrate micronized (LOFIBRA) 200 MG capsule Take 200 mg by mouth daily before breakfast.   Yes Historical Provider, MD  finasteride (PROSCAR) 5 MG tablet Take 5 mg by mouth daily.   Yes Historical Provider, MD  gabapentin (NEURONTIN) 600 MG tablet Take 600 mg by mouth 3 (three) times daily.    Yes Historical Provider, MD  glucosamine-chondroitin 500-400 MG tablet Take 1 tablet by mouth 2 (two) times daily.   Yes Historical Provider, MD  HUMALOG MIX 75/25 KWIKPEN (75-25) 100  UNIT/ML SUPN Inject 18-30 Units into the skin 2 (two) times daily. 30 units in the morning and 18 in the evening 04/27/13  Yes Historical Provider, MD  Insulin Glulisine (APIDRA SOLOSTAR) 100 UNIT/ML Solostar Pen Inject 3-7 Units into the skin See admin instructions. Prn over 250   Yes Historical Provider, MD  isosorbide mononitrate (IMDUR) 60 MG 24 hr tablet Take 1 tablet (60 mg total) by mouth daily. 01/06/14  Yes Mihai Croitoru, MD  lisinopril (PRINIVIL,ZESTRIL) 20 MG tablet Take 10 mg by mouth daily.   Yes Historical Provider, MD  metFORMIN (GLUCOPHAGE) 1000 MG tablet Take 1,000 mg by mouth 2 (two) times  daily with a meal.   Yes Historical Provider, MD  metoprolol tartrate (LOPRESSOR) 25 MG tablet Take 1 tablet (25 mg total) by mouth 2 (two) times daily. 11/24/13  Yes Mihai Croitoru, MD  nitroGLYCERIN (NITROLINGUAL) 0.4 MG/SPRAY spray Place 1 spray under the tongue every 5 (five) minutes x 3 doses as needed for chest pain. 11/24/13  Yes Mihai Croitoru, MD  pioglitazone (ACTOS) 30 MG tablet Take 30 mg by mouth daily.   Yes Historical Provider, MD  rosuvastatin (CRESTOR) 10 MG tablet Take 10 mg by mouth at bedtime.    Yes Historical Provider, MD  Tamsulosin HCl (FLOMAX) 0.4 MG CAPS Take 0.4 mg by mouth daily.   Yes Historical Provider, MD   No Known Allergies  FAMILY HISTORY:  Family History  Problem Relation Age of Onset  . Cancer Mother     Colon  . Heart attack Mother   . Stroke Father   . Hyperlipidemia Father   . Hyperlipidemia Sister   . Hyperlipidemia Brother   . Hyperlipidemia Daughter   . Hypertension Son    SOCIAL HISTORY:  reports that he quit smoking about 10 years ago. His smoking use included Cigarettes. He smoked 0.00 packs per day. He has never used smokeless tobacco. He reports that he does not drink alcohol or use illicit drugs.  REVIEW OF SYSTEMS:   Constitutional: Negative for fever, chills, malaise/fatigue and diaphoresis.  + 40 lb weight loss over last year HENT: Negative for hearing loss, ear pain, nosebleeds, congestion, sore throat, neck pain, tinnitus and ear discharge.   Eyes: Negative for blurred vision, double vision, photophobia, pain, discharge and redness.  Respiratory: Negative for hemoptysis, wheezing and stridor.  + Cough with minimal sputum production, shortness of breath with activity Cardiovascular: Negative for chest pain, palpitations, orthopnea, claudication, leg swelling and PND.  Gastrointestinal: Negative for heartburn, nausea, vomiting, abdominal pain, diarrhea, constipation, blood in stool and melena.  Genitourinary: Negative for dysuria,  urgency, frequency, hematuria and flank pain.  Musculoskeletal: Negative for myalgias, back pain, joint pain and falls.  Skin: Negative for itching and rash.  Neurological: Negative for dizziness, tingling, tremors, sensory change, speech change, focal weakness, seizures, loss of consciousness, weakness and headaches.  Endo/Heme/Allergies: Negative for environmental allergies and polydipsia. Does not bruise/bleed easily.  SUBJECTIVE:   VITAL SIGNS: Temp:  [97.6 F (36.4 C)-98.2 F (36.8 C)] 98.2 F (36.8 C) (05/18 0420) Pulse Rate:  [60-120] 117 (05/18 1035) Resp:  [16-19] 19 (05/18 0459) BP: (90-136)/(45-69) 104/63 mmHg (05/18 1035) SpO2:  [90 %-100 %] 90 % (05/18 0914) Weight:  [209 lb 4.8 oz (94.938 kg)-220 lb (99.791 kg)] 209 lb 4.8 oz (94.938 kg) (05/18 0420)  PHYSICAL EXAMINATION: General:  wdwn elderly male in NAD Neuro:  AAOx4, speech clear, MAE HEENT:  Mm pink/moist, no jvd, no LAN Cardiovascular:  s1s2 rrr, 2/6  SEM  Lungs:  resp's even/non-labored, lungs bilaterally distant but clear  Abdomen:  Round/soft, bsx4 active  Musculoskeletal:  No acute deformities  Skin:  Warm/dry, no edema    Recent Labs Lab 03/13/14 2212 03/14/14 0520  NA 132* 134*  K 4.2 4.2  CL 94* 97  CO2 23 23  BUN 17 18  CREATININE 1.39* 1.08  GLUCOSE 238* 252*    Recent Labs Lab 03/13/14 2212 03/14/14 0520  HGB 12.3* 11.7*  HCT 36.9* 36.1*  WBC 5.7 6.8  PLT 229 215   Dg Chest 2 View  03/14/2014   CLINICAL DATA:  LOSS OF CONSCIOUSNESS SHORTNESS OF BREATH  New Renal having  EXAM: CHEST  2 VIEW  COMPARISON:  NM PET IMAGE RESTAG (PS) SKULL BASE TO THIGH dated 02/20/2009; DG CHEST 2 VIEW dated 12/19/2007  FINDINGS: Cardiac silhouette within the upper limits normal. Calcified mitral valve annulus. Atherosclerotic calcification in the aorta. A 6 cm consolidative masslike left perihilar density with spiculation. There is volume loss in the left hemi thorax. There is flattening of the right  hemidiaphragm and blunting of the right costophrenic angle. The lungs otherwise clear. Degenerative changes within the shoulders.  IMPRESSION: Consolidative masslike density in the left perihilar region concerning for soft tissue mass until proven otherwise. Further evaluation with contrast chest CT recommended.  COPD   Electronically Signed   By: Margaree Mackintosh M.D.   On: 03/14/2014 01:18   Ct Angio Chest Pe W/cm &/or Wo Cm  03/14/2014   CLINICAL DATA:  Weakness and shortness of breath for the past 3 weeks. Abnormal chest x-ray.  EXAM: CT ANGIOGRAPHY CHEST WITH CONTRAST  TECHNIQUE: Multidetector CT imaging of the chest was performed using the standard protocol during bolus administration of intravenous contrast. Multiplanar CT image reconstructions and MIPs were obtained to evaluate the vascular anatomy.  CONTRAST:  41mL OMNIPAQUE IOHEXOL 350 MG/ML SOLN  COMPARISON:  PET-CT 02/20/2009.  FINDINGS: Mediastinum: There are no filling defects within the pulmonary arterial tree to suggest underlying pulmonary embolism. There is, however, severe narrowing of several pulmonary artery branches to the left upper lobe, related to a presumed perihilar mass in a left upper lobe as well as left hilar lymphadenopathy. This hilar discretion this hilar lymphadenopathy is difficult to discretely measure on today's examination. No other definite mediastinal or right hilar adenopathy is noted. Heart size is normal. There is no significant pericardial fluid, thickening or pericardial calcification. There is atherosclerosis of the thoracic aorta, the great vessels of the mediastinum and the coronary arteries, including calcified atherosclerotic plaque in the left main, left anterior descending, left circumflex and right coronary arteries. Thickening and calcification of the aortic valve. Severe mitral annular calcifications.  Lungs/Pleura: Complete atelectasis of the left upper lobe. In the perihilar aspect of the left upper lobe the  tissue exerts mass effect upon the adjacent bronchovascular structures, with marked narrowing of multiple pulmonary artery branches, and complete obliteration of the left upper lobe bronchus best demonstrated on image 50 of series 406. Left lower lobe is reasonably well aerated, although there is some peripheral ground-glass attenuation and subpleural reticulation in the inferior aspect of the left lower lobe.  Upper Abdomen: Calcified gallstone lying dependently in the gallbladder.  Musculoskeletal: There are no aggressive appearing lytic or blastic lesions noted in the visualized portions of the skeleton.  Review of the MIP images confirms the above findings.  IMPRESSION: 1. No evidence of pulmonary embolism. 2. However, there is an apparent left upper lobe perihilar  mass with complete occlusion of the left upper lobe bronchus, complete atelectasis of the left upper lobe, and marked narrowing of several left-sided pulmonary artery branches. Findings are highly suspicious for primary bronchogenic malignancy, and correlation with PET-CT and/or bronchoscopy for diagnostic and staging purposes is strongly recommended in the near future. 3. Atherosclerosis, including left main and 3 vessel coronary artery disease. Assessment for potential risk factor modification, dietary therapy or pharmacologic therapy may be warranted, if clinically indicated. 4. There are calcifications of the aortic valve and mitral annulus. Echocardiographic correlation for evaluation of potential valvular dysfunction may be warranted if clinically indicated. 5. Additional incidental findings, as above. These results were called by telephone at the time of interpretation on 03/14/2014 at 3:13 AM to Dr. Elyn Peers, who verbally acknowledged these results.   Electronically Signed   By: Vinnie Langton M.D.   On: 03/14/2014 03:15    ASSESSMENT / PLAN:  LUL Perihilar Mass - suspicious for primary lung cancer, former smoker with 40 lb weight  loss LUL Atelectasis COPD - not quantified, no home pulmonary medications listed Hx Large B-Cell Non-Hodgkin Lymphoma   Plan: FOB for tissue sampling / diagnosis  Bronchoscopy arranged for afternoon 5/18 pm (~2 pm) NPO now PET scan for staging as out pt BD's Empiric doxycycline Will need outpatient pulmonary follow up (Dr. Elsworth Soho to arrange) Transition to prednisone am 5/19 -no bspasm   Syncope- etio not clear Echo 11/2013 -mod AS 1.3 cm2 May need brain imaging as a part of staging wu  Noe Gens, NP-C [facility] Pgr: [phone] or [phone]  The various options of biopsy including bronchoscopy, CT guided needle aspiration and surgical biopsy were discussed.The risks of each procedure including coughing, bleeding and the  chances of lung puncture requiring chest tube were discussed in great detail. The benefits & alternatives including serial follow up were also discussed. Proceed with bronchoscopy Independently examined pt, evaluated data & formulated above care plan with NP who scribed this note & edited by me.   Rigoberto Noel MD [phone]  03/14/2014, 10:49 AM

## 2014-03-14 NOTE — Progress Notes (Signed)
Video bronchoscopy performed Intervention bronchial washings Intervention bronchial brushings Pt tolerated well  Elam Dutch RRT

## 2014-03-14 NOTE — Progress Notes (Signed)
OT Discharge Note  Patient Details Name: [name] [name] MRN: [id number] DOB: 10/29/1932   Cancelled Treatment:    Reason Eval/Treat Not Completed: OT screened, no needs identified, will sign off. Spoke directly to [employer] and no OT needs at this time. Pt is currently independent.  Peri Maris Pager: [phone]  03/14/2014, 2:20 PM

## 2014-03-14 NOTE — H&P (Signed)
Patient's PCP: [name], MD Patient's cardiologist: Dr. Sallyanne Kuster  Chief Complaint: Loss of consciousness  History of Present Illness: [name] [name] is a 80 y.o. Caucasian male with history of coronary disease, COPD, peripheral vascular disease, hypertension, hyperlipidemia, non-Hodgkin's lymphoma, peripheral neuropathy, type 2 diabetes who presents with the above complaints.  Patient reported that he was playing with his great-grandson yesterday afternoon.  At approximately 2:15-2:30 p.m., patient took a couple of steps back and fell against a television and bookcase.  He was caught by his grandson.  Patient soon thereafter regained consciousness.  He reports feeling dizzy and lightheaded prior to the episode.  Also of note, the air conditioner was not working properly in that room.  Patient checked his blood pressure by a wrist machine and his blood pressure was 90/50.  He was taken to another room which had air conditioner.  Patient checked his blood pressure again during the night was 108/60 during dinner.  Given patient's cardiac history, he was brought to the emergency department for further evaluation.  Patient reports that his been feeling short of breath for the last 3 weeks.  He also has been feeling dizzy over the same period of time.  He denies any fevers, chest pain, abdominal pain, diarrhea, headaches or vision changes.  In the emergency department he was found to be wheezy and was started on steroids and breathing treatments for COPD exacerbation.  Chest x-ray showed consolidative masslike density in the left perihilar region.  Hospitalist service was asked to admit the patient for further care and management.  Review of Systems: All systems reviewed with the patient and positive as per history of present illness, otherwise all other systems are negative.  Past Medical History  Diagnosis Date  . Peripheral arterial disease   . Diabetes mellitus without complication   . CAD (coronary  artery disease)   . Hyperlipidemia   . Hypertension   . Non Hodgkin's lymphoma   . Peripheral neuropathy   . COPD (chronic obstructive pulmonary disease)   . History of lower GI bleeding   . Substance abuse    Past Surgical History  Procedure Laterality Date  . Fracture surgery  2009    Left ankle  . Pr vein bypass graft,aorto-fem-pop  2005    Right common femoral to BK popliteal BPG  . Pr vein bypass graft,aorto-fem-pop  01-27-05    Revision of Right fem-pop BPG  . Cardiac catheterization  1990's   Family History  Problem Relation Age of Onset  . Cancer Mother     Colon  . Heart attack Mother   . Stroke Father   . Hyperlipidemia Father   . Hyperlipidemia Sister   . Hyperlipidemia Brother   . Hyperlipidemia Daughter   . Hypertension Son    History   Social History  . Marital Status: Divorced    Spouse Name: N/A    Number of Children: N/A  . Years of Education: N/A   Occupational History  . Not on file.   Social History Main Topics  . Smoking status: Former Smoker    Types: Cigarettes    Quit date: 10/29/2003  . Smokeless tobacco: Never Used  . Alcohol Use: No  . Drug Use: No  . Sexual Activity: Not on file   Other Topics Concern  . Not on file   Social History Narrative  . No narrative on file   Allergies: Review of patient's allergies indicates no known allergies.  Home Meds: Prior to Admission medications  Medication Sig Start Date End Date Taking? Authorizing Provider  Acetaminophen (TYLENOL ARTHRITIS PAIN PO) Take 650 mg by mouth 2 (two) times daily.    Yes Historical Provider, MD  aspirin 81 MG tablet Take 160 mg by mouth 2 (two) times daily.   Yes Historical Provider, MD  CINNAMON PO Take 1,000 mg by mouth 2 (two) times daily.   Yes Historical Provider, MD  DULoxetine (CYMBALTA) 30 MG capsule Take 30 mg by mouth daily. 04/27/13  Yes Historical Provider, MD  fenofibrate micronized (LOFIBRA) 200 MG capsule Take 200 mg by mouth daily before  breakfast.   Yes Historical Provider, MD  finasteride (PROSCAR) 5 MG tablet Take 5 mg by mouth daily.   Yes Historical Provider, MD  gabapentin (NEURONTIN) 600 MG tablet Take 600 mg by mouth 3 (three) times daily.    Yes Historical Provider, MD  glucosamine-chondroitin 500-400 MG tablet Take 1 tablet by mouth 2 (two) times daily.   Yes Historical Provider, MD  HUMALOG MIX 75/25 KWIKPEN (75-25) 100 UNIT/ML SUPN Inject 18-30 Units into the skin 2 (two) times daily. 30 units in the morning and 18 in the evening 04/27/13  Yes Historical Provider, MD  Insulin Glulisine (APIDRA SOLOSTAR) 100 UNIT/ML Solostar Pen Inject 3-7 Units into the skin See admin instructions. Prn over 250   Yes Historical Provider, MD  isosorbide mononitrate (IMDUR) 60 MG 24 hr tablet Take 1 tablet (60 mg total) by mouth daily. 01/06/14  Yes Mihai Croitoru, MD  lisinopril (PRINIVIL,ZESTRIL) 20 MG tablet Take 10 mg by mouth daily.   Yes Historical Provider, MD  metFORMIN (GLUCOPHAGE) 1000 MG tablet Take 1,000 mg by mouth 2 (two) times daily with a meal.   Yes Historical Provider, MD  metoprolol tartrate (LOPRESSOR) 25 MG tablet Take 1 tablet (25 mg total) by mouth 2 (two) times daily. 11/24/13  Yes Mihai Croitoru, MD  nitroGLYCERIN (NITROLINGUAL) 0.4 MG/SPRAY spray Place 1 spray under the tongue every 5 (five) minutes x 3 doses as needed for chest pain. 11/24/13  Yes Mihai Croitoru, MD  pioglitazone (ACTOS) 30 MG tablet Take 30 mg by mouth daily.   Yes Historical Provider, MD  rosuvastatin (CRESTOR) 10 MG tablet Take 10 mg by mouth at bedtime.    Yes Historical Provider, MD  Tamsulosin HCl (FLOMAX) 0.4 MG CAPS Take 0.4 mg by mouth daily.   Yes Historical Provider, MD    Physical Exam: Blood pressure 110/49, pulse 68, temperature 97.6 F (36.4 C), temperature source Oral, resp. rate 18, height 5\' 11"  (1.803 m), weight 99.791 kg (220 lb), SpO2 100.00%. General: Awake, Oriented x3, No acute distress. HEENT: EOMI, Moist mucous  membranes Neck: Supple CV: S1 and S2, grade 2/6 systolic murmur Lungs: Inspiratory and expiratory wheezing bilaterally, moderate air movement. Abdomen: Soft, Nontender, Nondistended, +bowel sounds. Ext: Good pulses. Trace edema. No clubbing or cyanosis noted. Neuro: Cranial Nerves II-XII grossly intact. Has 5/5 motor strength in upper and lower extremities.  Lab results:  Recent Labs  03/13/14 2212  NA 132*  K 4.2  CL 94*  CO2 23  GLUCOSE 238*  BUN 17  CREATININE 1.39*  CALCIUM 10.2   No results found for this basename: AST, ALT, ALKPHOS, BILITOT, PROT, ALBUMIN,  in the last 72 hours No results found for this basename: LIPASE, AMYLASE,  in the last 72 hours  Recent Labs  03/13/14 2212  WBC 5.7  HGB 12.3*  HCT 36.9*  MCV 85.8  PLT 229   No results found for this  basename: CKTOTAL, CKMB, CKMBINDEX, TROPONINI,  in the last 72 hours No components found with this basename: POCBNP,  No results found for this basename: DDIMER,  in the last 72 hours No results found for this basename: HGBA1C,  in the last 72 hours No results found for this basename: CHOL, HDL, LDLCALC, TRIG, CHOLHDL, LDLDIRECT,  in the last 72 hours No results found for this basename: TSH, T4TOTAL, FREET3, T3FREE, THYROIDAB,  in the last 72 hours No results found for this basename: VITAMINB12, FOLATE, FERRITIN, TIBC, IRON, RETICCTPCT,  in the last 72 hours Imaging results:  Dg Chest 2 View  03/14/2014   CLINICAL DATA:  LOSS OF CONSCIOUSNESS SHORTNESS OF BREATH  New Renal having  EXAM: CHEST  2 VIEW  COMPARISON:  NM PET IMAGE RESTAG (PS) SKULL BASE TO THIGH dated 02/20/2009; DG CHEST 2 VIEW dated 12/19/2007  FINDINGS: Cardiac silhouette within the upper limits normal. Calcified mitral valve annulus. Atherosclerotic calcification in the aorta. A 6 cm consolidative masslike left perihilar density with spiculation. There is volume loss in the left hemi thorax. There is flattening of the right hemidiaphragm and blunting  of the right costophrenic angle. The lungs otherwise clear. Degenerative changes within the shoulders.  IMPRESSION: Consolidative masslike density in the left perihilar region concerning for soft tissue mass until proven otherwise. Further evaluation with contrast chest CT recommended.  COPD   Electronically Signed   By: Margaree Mackintosh M.D.   On: 03/14/2014 01:18   Other results: EKG: Normal sinus rhythm with LVH, unchanged from previous EKG.  Assessment & Plan by Problem: Syncope Etiology unclear.  Admit the patient to telemetry.  Cycle cardiac enzymes to rule the patient out for acute coronary syndrome.  Given masslike lesion found in the left perihilar, request CT of the chest with contrast to evaluate for pulmonary embolism.  Check patient's orthostatics.  Will gently hydrate the patient on IV fluids.  Patient had a 2-D echocardiogram on 12/02/2013 which showed systolic function was normal with EF of 58-52%, grade 1 diastolic dysfunction.  Patient also had a stress test in February of 2015 which showed small and fixed inferoapical defect on perfusion studies.  Question if patient had vasovagal syncope given patient was in an un-air-conditioned room.  Left upper lobe perihilar mass with complete occlusion of the left upper lung bronchus CT findings concerning for primary bronchogenic malignancy in the setting of patient with history of non-Hodgkin's lymphoma.  May benefit from requesting consultation with Dr. Benay Spice, his oncologist in the morning for further evaluation.  Type 2 diabetes with neuropathy Sliding scale insulin.  Diabetic diet.  Hold metformin.  Hyperlipidemia Continue home medications.  Hypertension Patient had hypotension on home blood pressure machine.  Has been normotensive appears so far.  Continue home antihypertensive medications.  Titrate antihypertensive medications depending on patient's clinical course.  Acute respiratory failure with COPD exacerbation Continue  nebulizer therapy.  Patient started on steroids which will be continued.  May be related to left upper lobe perihilar mass.  Patient started on doxycycline, empiric, define a seven-day course of antibiotics.  Coronary artery disease and peripheral vascular disease Continue home medications.  Neuropathy Continue gabapentin.  BPH Continue Flomax.  Chronic kidney disease stage III Renal function at baseline.  Continue to monitor.  Prophylaxis Lovenox.  CODE STATUS Full code.  This was discussed with the patient at the time of admission.  Disposition Admit the patient as inpatient to telemetry.  Time spent on admission, talking to the patient, and coordinating  care was: 60 mins.  Bynum Bellows, MD 03/14/2014, 2:44 AM

## 2014-03-14 NOTE — ED Notes (Signed)
Pt reports a decrease in appetite.

## 2014-03-15 ENCOUNTER — Encounter (HOSPITAL_COMMUNITY): Payer: Self-pay | Admitting: Pulmonary Disease

## 2014-03-15 DIAGNOSIS — R0602 Shortness of breath: Secondary | ICD-10-CM

## 2014-03-15 DIAGNOSIS — I739 Peripheral vascular disease, unspecified: Secondary | ICD-10-CM

## 2014-03-15 DIAGNOSIS — C3490 Malignant neoplasm of unspecified part of unspecified bronchus or lung: Secondary | ICD-10-CM

## 2014-03-15 DIAGNOSIS — E119 Type 2 diabetes mellitus without complications: Secondary | ICD-10-CM

## 2014-03-15 DIAGNOSIS — I2510 Atherosclerotic heart disease of native coronary artery without angina pectoris: Secondary | ICD-10-CM

## 2014-03-15 DIAGNOSIS — J449 Chronic obstructive pulmonary disease, unspecified: Secondary | ICD-10-CM

## 2014-03-15 LAB — GLUCOSE, CAPILLARY
Glucose-Capillary: 249 mg/dL — ABNORMAL HIGH (ref 70–99)
Glucose-Capillary: 204 mg/dL — ABNORMAL HIGH (ref 70–99)
Glucose-Capillary: 284 mg/dL — ABNORMAL HIGH (ref 70–99)

## 2014-03-15 LAB — TROPONIN I
Troponin I: <0.3 ng/mL (ref <0.30)
Troponin I: <0.3 ng/mL (ref <0.30)

## 2014-03-15 MED ORDER — DOXYCYCLINE HYCLATE 100 MG PO TABS: 100.0000 mg | ORAL_TABLET | Freq: Two times a day (BID) | ORAL | Status: DC

## 2014-03-15 MED ORDER — IPRATROPIUM-ALBUTEROL 0.5-2.5 (3) MG/3ML IN SOLN: 3.0000 mL | Freq: Four times a day (QID) | RESPIRATORY_TRACT | Status: DC | PRN

## 2014-03-15 MED ORDER — PREDNISONE 20 MG PO TABS
40.0000 mg | ORAL_TABLET | Freq: Every day | ORAL | Status: DC
  Filled 2014-03-15: qty 2

## 2014-03-15 MED ORDER — PREDNISONE 20 MG PO TABS: 40.0000 mg | ORAL_TABLET | Freq: Every day | ORAL | Status: DC

## 2014-03-15 NOTE — Progress Notes (Signed)
[facility] PROGRESS NOTE  [name] [name] NAT:[id number] DOB: Mar 20, 1933 DOA: 03/13/2014 PCP: [name], MD  Assessment/Plan: Syncope  -Etiology unclear -Cardiac enzymes neg x 2 thus far -CT of the chest with contrast neg for pulmonary embolism.  -Not orthostatic -Cont IV fluids -Patient had a 2-D echocardiogram on 12/02/2013 which showed systolic function was normal with EF of 42-70%, grade 1 diastolic dysfunction -Patient also had a stress test in February of 2015 which showed small and fixed inferoapical defect on perfusion studies. -Question vasovagal syncope  Left upper lobe perihilar mass with complete occlusion of the left upper lung bronchus  -CT findings concerning for primary bronchogenic malignancy in the setting of patient with history of non-Hodgkin's lymphoma.  -Dr. Benay Spice, his oncologist, consulted  Type 2 diabetes with neuropathy  -Sliding scale insulin. Diabetic diet. Holding metformin.  Hyperlipidemia  -Continue home medications.  Hypertension  -BP noted to be initially low -Stable currently. Cont monitor.  Acute respiratory failure with COPD exacerbation  -Continue nebulizer therapy. -May be related to left upper lobe perihilar mass.  -On empiric doxycycline  Coronary artery disease and peripheral vascular disease  Continue home medications.  Neuropathy  Continue gabapentin.  BPH  Continue Flomax.  Chronic kidney disease stage III  Renal function at baseline. Continue to monitor.  Prophylaxis  Lovenox.  Code Status: Full Family Communication: Pt and family in room  Disposition Plan: Pending   Consultants:  Oncology  Pulmonary  Procedures:  Bronch 5/18  Antibiotics:  Doxycycline 5/18>>>  HPI/Subjective: Feels well. Eager to go home.  Objective: Filed Vitals:   03/14/14 2014 03/14/14 2051 03/15/14 0435 03/15/14 0822  BP: 102/58  118/63   Pulse: 75  62   Temp: 98.5 F (36.9 C)  97.6 F (36.4 C)   TempSrc: Oral  Oral    Resp: 18  18   Height:      Weight:   95 kg (209 lb 7 oz)   SpO2: 95% 96% 95% 94%    Intake/Output Summary (Last 24 hours) at 03/15/14 0840 Last data filed at 03/14/14 1700  Gross per 24 hour  Intake    240 ml  Output      0 ml  Net    240 ml   Filed Weights   03/13/14 2344 03/14/14 0420 03/15/14 0435  Weight: 99.791 kg (220 lb) 94.938 kg (209 lb 4.8 oz) 95 kg (209 lb 7 oz)    Exam:   General:  Awake, in nad  Cardiovascular: regular, s1, s2  Respiratory: normal resp effort, no wheezing  Abdomen: soft,nondistended  Musculoskeletal: perfused, no clubbing   Data Reviewed: Basic Metabolic Panel:  Recent Labs Lab 03/13/14 2212 03/14/14 0520  NA 132* 134*  K 4.2 4.2  CL 94* 97  CO2 23 23  GLUCOSE 238* 252*  BUN 17 18  CREATININE 1.39* 1.08  CALCIUM 10.2 10.0   Liver Function Tests: No results found for this basename: AST, ALT, ALKPHOS, BILITOT, PROT, ALBUMIN,  in the last 168 hours No results found for this basename: LIPASE, AMYLASE,  in the last 168 hours No results found for this basename: AMMONIA,  in the last 168 hours CBC:  Recent Labs Lab 03/13/14 2212 03/14/14 0520  WBC 5.7 6.8  HGB 12.3* 11.7*  HCT 36.9* 36.1*  MCV 85.8 86.2  PLT 229 215   Cardiac Enzymes:  Recent Labs Lab 03/15/14 0530  TROPONINI <0.30   BNP (last 3 results) No results found for this basename: PROBNP,  in  the last 8760 hours CBG:  Recent Labs Lab 03/14/14 0721 03/14/14 1134 03/14/14 1701 03/14/14 2125 03/15/14 0623  GLUCAP 250* 304* 171* 232* 249*    No results found for this or any previous visit (from the past 240 hour(s)).   Studies: Dg Chest 2 View  03/14/2014   CLINICAL DATA:  LOSS OF CONSCIOUSNESS SHORTNESS OF BREATH  New Renal having  EXAM: CHEST  2 VIEW  COMPARISON:  NM PET IMAGE RESTAG (PS) SKULL BASE TO THIGH dated 02/20/2009; DG CHEST 2 VIEW dated 12/19/2007  FINDINGS: Cardiac silhouette within the upper limits normal. Calcified mitral valve  annulus. Atherosclerotic calcification in the aorta. A 6 cm consolidative masslike left perihilar density with spiculation. There is volume loss in the left hemi thorax. There is flattening of the right hemidiaphragm and blunting of the right costophrenic angle. The lungs otherwise clear. Degenerative changes within the shoulders.  IMPRESSION: Consolidative masslike density in the left perihilar region concerning for soft tissue mass until proven otherwise. Further evaluation with contrast chest CT recommended.  COPD   Electronically Signed   By: Margaree Mackintosh M.D.   On: 03/14/2014 01:18   Ct Angio Chest Pe W/cm &/or Wo Cm  03/14/2014   CLINICAL DATA:  Weakness and shortness of breath for the past 3 weeks. Abnormal chest x-ray.  EXAM: CT ANGIOGRAPHY CHEST WITH CONTRAST  TECHNIQUE: Multidetector CT imaging of the chest was performed using the standard protocol during bolus administration of intravenous contrast. Multiplanar CT image reconstructions and MIPs were obtained to evaluate the vascular anatomy.  CONTRAST:  47mL OMNIPAQUE IOHEXOL 350 MG/ML SOLN  COMPARISON:  PET-CT 02/20/2009.  FINDINGS: Mediastinum: There are no filling defects within the pulmonary arterial tree to suggest underlying pulmonary embolism. There is, however, severe narrowing of several pulmonary artery branches to the left upper lobe, related to a presumed perihilar mass in a left upper lobe as well as left hilar lymphadenopathy. This hilar discretion this hilar lymphadenopathy is difficult to discretely measure on today's examination. No other definite mediastinal or right hilar adenopathy is noted. Heart size is normal. There is no significant pericardial fluid, thickening or pericardial calcification. There is atherosclerosis of the thoracic aorta, the great vessels of the mediastinum and the coronary arteries, including calcified atherosclerotic plaque in the left main, left anterior descending, left circumflex and right coronary  arteries. Thickening and calcification of the aortic valve. Severe mitral annular calcifications.  Lungs/Pleura: Complete atelectasis of the left upper lobe. In the perihilar aspect of the left upper lobe the tissue exerts mass effect upon the adjacent bronchovascular structures, with marked narrowing of multiple pulmonary artery branches, and complete obliteration of the left upper lobe bronchus best demonstrated on image 50 of series 406. Left lower lobe is reasonably well aerated, although there is some peripheral ground-glass attenuation and subpleural reticulation in the inferior aspect of the left lower lobe.  Upper Abdomen: Calcified gallstone lying dependently in the gallbladder.  Musculoskeletal: There are no aggressive appearing lytic or blastic lesions noted in the visualized portions of the skeleton.  Review of the MIP images confirms the above findings.  IMPRESSION: 1. No evidence of pulmonary embolism. 2. However, there is an apparent left upper lobe perihilar mass with complete occlusion of the left upper lobe bronchus, complete atelectasis of the left upper lobe, and marked narrowing of several left-sided pulmonary artery branches. Findings are highly suspicious for primary bronchogenic malignancy, and correlation with PET-CT and/or bronchoscopy for diagnostic and staging purposes is strongly recommended in  the near future. 3. Atherosclerosis, including left main and 3 vessel coronary artery disease. Assessment for potential risk factor modification, dietary therapy or pharmacologic therapy may be warranted, if clinically indicated. 4. There are calcifications of the aortic valve and mitral annulus. Echocardiographic correlation for evaluation of potential valvular dysfunction may be warranted if clinically indicated. 5. Additional incidental findings, as above. These results were called by telephone at the time of interpretation on 03/14/2014 at 3:13 AM to Dr. Elyn Peers, who verbally acknowledged  these results.   Electronically Signed   By: Vinnie Langton M.D.   On: 03/14/2014 03:15    Scheduled Meds: . aspirin  162 mg Oral BID  . atorvastatin  20 mg Oral q1800  . doxycycline  100 mg Oral Q12H  . DULoxetine  30 mg Oral Daily  . enoxaparin (LOVENOX) injection  40 mg Subcutaneous Q24H  . fenofibrate  160 mg Oral Daily  . finasteride  5 mg Oral Daily  . gabapentin  600 mg Oral TID  . insulin aspart  0-5 Units Subcutaneous QHS  . insulin aspart  0-9 Units Subcutaneous TID WC  . insulin aspart protamine- aspart  18 Units Subcutaneous Q supper  . insulin aspart protamine- aspart  30 Units Subcutaneous Q breakfast  . ipratropium-albuterol  3 mL Nebulization TID  . isosorbide mononitrate  60 mg Oral Daily  . lisinopril  10 mg Oral Daily  . metoprolol tartrate  25 mg Oral BID  . pioglitazone  30 mg Oral Daily  . predniSONE  60 mg Oral Q breakfast  . sodium chloride  3 mL Intravenous Q12H  . tamsulosin  0.4 mg Oral Daily   Continuous Infusions: . sodium chloride 10 mL/hr at 03/14/14 1330    Principal Problem:   Syncope Active Problems:   DIABETES MELLITUS, TYPE II, ON INSULIN   HYPERLIPIDEMIA   HYPERTENSION   CAD   PAD (peripheral artery disease)   Cardiac murmur, previously undiagnosed   Syncope and collapse   COPD with exacerbation   Non Hodgkin's lymphoma   Lung mass  Time spent: 58min  Kenner Lewan K Mikeila Burgen  [facility] Pager [phone]. If 7PM-7AM, please contact night-coverage at [email or web address], password [facility] 03/15/2014, 8:40 AM  LOS: 2 days

## 2014-03-15 NOTE — Progress Notes (Signed)
Name: [name] [name] MRN: [id number] DOB: Jul 12, 1933    ADMISSION DATE:  03/13/2014 CONSULTATION DATE:  03/14/14  REFERRING MD :  Dr. Wendee Beavers  PRIMARY SERVICE:  [facility]   CHIEF COMPLAINT:  Lung Mass   BRIEF PATIENT DESCRIPTION: 80 y/o M with PMH of COPD who was admitted on 5/18 with an episode of syncope.  Also reported 3 wk history of increasing SOB.  Further evaluation noted CT with LUL lung mass.  PCCM consulted for evaluation.   SIGNIFICANT EVENTS / STUDIES:  5/18 - Admit with syncope, 3 wk increasing SOB 5/18 - CT Chest >> neg for PE, LUL perihilar mass with occlusion of LUL bronchus, complete atx of LUL, marked narrowing of L sided pulmonary artery branches.  3 vessel CAD   CULTURES:  ANTIBIOTICS: Doxycycline (AECOPD) 5/18 >>  HISTORY OF PRESENT ILLNESS:  79 y/o M, former smoker, with PMH of DM, CAD, HTN, HLD, PAD, GIB (lower), Substance Abuse, Non-Hodgkins Lymphoma & COPD who presented to the [facility] ER on 5/18 with a three week history of increasing shortness of breath and isolated syncopal episode.  Patient was playing with great-grandson in a non-air conditioned room and suffered a syncopal episode.  He was caught by his grandson.  He endorses 40 lb weight loss over the last year, intermittent cough with minimal sputum production.  Denies hemoptysis, chest pain, pain with inspiration, n/v/d, orthopnea.   ER evaluation noted significant wheezing on initial exam and pt was treated with nebulized bronchodilators & steroids.  Initial troponin negative.  CTA of chest assessed for PE and negative but revealed a LUL obstructing mass with subsequent LUL atelectasis.    SUBJECTIVE: denies CP, dyspnea  VITAL SIGNS: Temp:  [97.5 F (36.4 C)-98.5 F (36.9 C)] 97.5 F (36.4 C) (05/19 1350) Pulse Rate:  [60-75] 60 (05/19 1350) Resp:  [18-20] 20 (05/19 1350) BP: (100-118)/(51-63) 100/51 mmHg (05/19 1350) SpO2:  [94 %-96 %] 95 % (05/19 1350) Weight:  [95 kg (209 lb 7 oz)] 95 kg (209 lb 7 oz) (05/19  0435)  PHYSICAL EXAMINATION: General:  wdwn elderly male in NAD Neuro:  AAOx4, speech clear, MAE HEENT:  Mm pink/moist, no jvd, no LAN Cardiovascular:  s1s2 rrr, 2/6 SEM  Lungs:  resp's even/non-labored, lungs bilaterally distant but clear  Abdomen:  Round/soft, bsx4 active  Musculoskeletal:  No acute deformities  Skin:  Warm/dry, no edema    Recent Labs Lab 03/13/14 2212 03/14/14 0520  NA 132* 134*  K 4.2 4.2  CL 94* 97  CO2 23 23  BUN 17 18  CREATININE 1.39* 1.08  GLUCOSE 238* 252*    Recent Labs Lab 03/13/14 2212 03/14/14 0520  HGB 12.3* 11.7*  HCT 36.9* 36.1*  WBC 5.7 6.8  PLT 229 215   Dg Chest 2 View  03/14/2014   CLINICAL DATA:  LOSS OF CONSCIOUSNESS SHORTNESS OF BREATH  New Renal having  EXAM: CHEST  2 VIEW  COMPARISON:  NM PET IMAGE RESTAG (PS) SKULL BASE TO THIGH dated 02/20/2009; DG CHEST 2 VIEW dated 12/19/2007  FINDINGS: Cardiac silhouette within the upper limits normal. Calcified mitral valve annulus. Atherosclerotic calcification in the aorta. A 6 cm consolidative masslike left perihilar density with spiculation. There is volume loss in the left hemi thorax. There is flattening of the right hemidiaphragm and blunting of the right costophrenic angle. The lungs otherwise clear. Degenerative changes within the shoulders.  IMPRESSION: Consolidative masslike density in the left perihilar region concerning for soft tissue mass until proven  otherwise. Further evaluation with contrast chest CT recommended.  COPD   Electronically Signed   By: Margaree Mackintosh M.D.   On: 03/14/2014 01:18   Ct Angio Chest Pe W/cm &/or Wo Cm  03/14/2014   CLINICAL DATA:  Weakness and shortness of breath for the past 3 weeks. Abnormal chest x-ray.  EXAM: CT ANGIOGRAPHY CHEST WITH CONTRAST  TECHNIQUE: Multidetector CT imaging of the chest was performed using the standard protocol during bolus administration of intravenous contrast. Multiplanar CT image reconstructions and MIPs were obtained to  evaluate the vascular anatomy.  CONTRAST:  61mL OMNIPAQUE IOHEXOL 350 MG/ML SOLN  COMPARISON:  PET-CT 02/20/2009.  FINDINGS: Mediastinum: There are no filling defects within the pulmonary arterial tree to suggest underlying pulmonary embolism. There is, however, severe narrowing of several pulmonary artery branches to the left upper lobe, related to a presumed perihilar mass in a left upper lobe as well as left hilar lymphadenopathy. This hilar discretion this hilar lymphadenopathy is difficult to discretely measure on today's examination. No other definite mediastinal or right hilar adenopathy is noted. Heart size is normal. There is no significant pericardial fluid, thickening or pericardial calcification. There is atherosclerosis of the thoracic aorta, the great vessels of the mediastinum and the coronary arteries, including calcified atherosclerotic plaque in the left main, left anterior descending, left circumflex and right coronary arteries. Thickening and calcification of the aortic valve. Severe mitral annular calcifications.  Lungs/Pleura: Complete atelectasis of the left upper lobe. In the perihilar aspect of the left upper lobe the tissue exerts mass effect upon the adjacent bronchovascular structures, with marked narrowing of multiple pulmonary artery branches, and complete obliteration of the left upper lobe bronchus best demonstrated on image 50 of series 406. Left lower lobe is reasonably well aerated, although there is some peripheral ground-glass attenuation and subpleural reticulation in the inferior aspect of the left lower lobe.  Upper Abdomen: Calcified gallstone lying dependently in the gallbladder.  Musculoskeletal: There are no aggressive appearing lytic or blastic lesions noted in the visualized portions of the skeleton.  Review of the MIP images confirms the above findings.  IMPRESSION: 1. No evidence of pulmonary embolism. 2. However, there is an apparent left upper lobe perihilar mass  with complete occlusion of the left upper lobe bronchus, complete atelectasis of the left upper lobe, and marked narrowing of several left-sided pulmonary artery branches. Findings are highly suspicious for primary bronchogenic malignancy, and correlation with PET-CT and/or bronchoscopy for diagnostic and staging purposes is strongly recommended in the near future. 3. Atherosclerosis, including left main and 3 vessel coronary artery disease. Assessment for potential risk factor modification, dietary therapy or pharmacologic therapy may be warranted, if clinically indicated. 4. There are calcifications of the aortic valve and mitral annulus. Echocardiographic correlation for evaluation of potential valvular dysfunction may be warranted if clinically indicated. 5. Additional incidental findings, as above. These results were called by telephone at the time of interpretation on 03/14/2014 at 3:13 AM to Dr. Elyn Peers, who verbally acknowledged these results.   Electronically Signed   By: Vinnie Langton M.D.   On: 03/14/2014 03:15    ASSESSMENT / PLAN:  Squamous cell lung cancer -LUL Perihilar Mass - former smoker with 40 lb weight loss LUL Atelectasis COPD - not quantified, no home pulmonary medications listed Hx Large B-Cell Non-Hodgkin Lymphoma   Plan: PET scan for staging mediastinum as out pt , expect at least stg III B - if needed can consider EBUS staging after PET BD's Empiric  doxycycline  Transition to prednisone  -no bspasm   Syncope- etio not clear Echo 11/2013 -mod AS 1.3 cm2 May need brain imaging as a part of staging wu, defer to onc  - outpatient pulmonary follow up arranged, I discussed with pathology &  relayed diagnosis to pt   Rigoberto Noel MD [phone]  03/15/2014, 5:01 PM

## 2014-03-15 NOTE — Consult Note (Signed)
[name] [name]  Telephone:([phone]) [place] NOTE  [name] [name]                                MR#: [id number]  DOB: 1933-04-22                       CSN#: [id number]  Referring MD: Dr. Sheliah [facility]   Primary MD: Dr.PERINI,MARK A, MD   Reason for Consult: Lung Cancer   [id number] [name] [name] is a 80 y.o. male former smoker with a history of COPD and Non Hodgkins lymphoma in remission,  asked to see in consultation for evaluation of possible  lung cancer. He presented to the [facility] ER on 5/17 with a three week history of increasing shortness of breath and an isolated syncopal episode without trauma to head. He denied hemoptysis but he has productive cough. No cardiac complaints. No pleuritic chest pain. He does report a 40 lb weight loss over the last year despite good appetite.   A CT angio of the Chest on 5/18 was negative for PE but did show a left upper lobe perihilar mass with complete occlusion of the left upper lobe bronchus, and marked narrowing of several left-sided pulmonary artery branches highly suspicious for primary bronchogenic malignancy.He underwent Fiberoptic bronchoscopy with bronchial washings on 5/18. Pathology currently pending. We were asked to see the patient in consultation with recommendations.   Oncological History: 1.  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 Neulasta support was started beginning with cycle #2. He received Zinecard with the 3 cycles of CHOP/Rituxan. The last cycle of chemotherapy was given in May of 2010.  a. A restaging PET scan 02/20/2009 indicated marked improvement in the hypermetabolic lymphadenopathy.  b. A CT of the abdomen and pelvis in October 2010 revealed no evidence for disease progression.  2. History of severe  neutropenia following cycle #1 of CHOP/Rituxan.        3.   Non-Hodgkin lymphoma diagnosed in September 2000 with clinical remission following CHOP chemotherapy.          PMH:  Past Medical History  Diagnosis Date  . Peripheral arterial disease   . Diabetes mellitus without complication   . CAD (coronary artery disease)   . Hyperlipidemia   . Hypertension   . Non Hodgkin's lymphoma   . Peripheral neuropathy   . COPD (chronic obstructive pulmonary disease)   . History of lower GI bleeding   . Substance abuse     Surgeries:  Past Surgical History  Procedure Laterality Date  . Fracture surgery  2009    Left ankle  . Pr vein bypass graft,aorto-fem-pop  2005    Right common femoral to BK popliteal BPG  . Pr vein bypass graft,aorto-fem-pop  01-27-05    Revision of Right fem-pop BPG  . Cardiac catheterization  1990's    Allergies: No Known Allergies  Medications:   Prior to Admission:  Prescriptions prior to admission  Medication Sig Dispense Refill  . Acetaminophen (TYLENOL ARTHRITIS PAIN PO) Take 650 mg by mouth 2 (two) times daily.       Marland Kitchen aspirin 81 MG tablet Take 160 mg by mouth 2 (two) times daily.      Marland Kitchen CINNAMON PO Take  1,000 mg by mouth 2 (two) times daily.      . DULoxetine (CYMBALTA) 30 MG capsule Take 30 mg by mouth daily.      . fenofibrate micronized (LOFIBRA) 200 MG capsule Take 200 mg by mouth daily before breakfast.      . finasteride (PROSCAR) 5 MG tablet Take 5 mg by mouth daily.      Marland Kitchen gabapentin (NEURONTIN) 600 MG tablet Take 600 mg by mouth 3 (three) times daily.       Marland Kitchen glucosamine-chondroitin 500-400 MG tablet Take 1 tablet by mouth 2 (two) times daily.      Marland Kitchen HUMALOG MIX 75/25 KWIKPEN (75-25) 100 UNIT/ML SUPN Inject 18-30 Units into the skin 2 (two) times daily. 30 units in the morning and 18 in the evening      . Insulin Glulisine (APIDRA SOLOSTAR) 100 UNIT/ML Solostar Pen Inject 3-7 Units into the skin See admin instructions. Prn over 250      .  isosorbide mononitrate (IMDUR) 60 MG 24 hr tablet Take 1 tablet (60 mg total) by mouth daily.  90 tablet  3  . lisinopril (PRINIVIL,ZESTRIL) 20 MG tablet Take 10 mg by mouth daily.      . metFORMIN (GLUCOPHAGE) 1000 MG tablet Take 1,000 mg by mouth 2 (two) times daily with a meal.      . metoprolol tartrate (LOPRESSOR) 25 MG tablet Take 1 tablet (25 mg total) by mouth 2 (two) times daily.  90 tablet  3  . nitroGLYCERIN (NITROLINGUAL) 0.4 MG/SPRAY spray Place 1 spray under the tongue every 5 (five) minutes x 3 doses as needed for chest pain.  12 g  12  . pioglitazone (ACTOS) 30 MG tablet Take 30 mg by mouth daily.      . rosuvastatin (CRESTOR) 10 MG tablet Take 10 mg by mouth at bedtime.       . Tamsulosin HCl (FLOMAX) 0.4 MG CAPS Take 0.4 mg by mouth daily.        GUR:KYHCWCBJSEGBT, acetaminophen, albuterol, fentaNYL, guaiFENesin-dextromethorphan, ipratropium-albuterol, lidocaine, lidocaine, midazolam, ondansetron (ZOFRAN) IV, ondansetron, phenylephrine, phenylephrine  ROS: Constitutional: Denies fevers, chills or abnormal night sweats Ears,nose,mouth, throat,and face: Denies mucositis or sore throat Respiratory: Productive cough without hemoptysis and dyspnea. Denies wheezes.  Cardiovascular: Denies palpitations, chest discomfort or lower extremity swelling Gastrointestinal: Denies nausea, heartburn or change in bowel habits Skin: Denies abnormal skin rashes Lymphatics: Denies new lymphadenopathy or easy bruising Neurological:Denies numbness, tingling or new weaknesses. He does complain of "intermittent curtains" in his eyes over the last month. No headaches or mental status changes. Behavioral/Psych: Mood is stable, no new changes  All other systems were reviewed with the patient and are negative.   Family History:    Family History  Problem Relation Age of Onset  . Cancer Mother     Colon  . Heart attack Mother   . Stroke Father   . Hyperlipidemia Father   . Hyperlipidemia Sister    . Hyperlipidemia/Colon Cancer Brother   . Hyperlipidemia Daughter   . Hypertension Son     No family history of lung cancer  Social History:  He is divorced and resides alone. He does have two children and a number of grandchildren. He is a retired Administrator. He smoked one pack of cigarettes per day and has smoked for 50 years plus. He denies any history of alcohol use.     Physical Exam   ECOG PERFORMANCE STATUS: 1 Symptomatic but completely ambulatory     Filed  Vitals:   03/15/14 0435  BP: 118/63  Pulse: 62  Temp: 97.6 F (36.4 [name])  Resp: 18   Filed Weights   03/13/14 2344 03/14/14 0420 03/15/14 0435  Weight: 220 lb (99.791 kg) 209 lb 4.8 oz (94.938 kg) 209 lb 7 oz (95 kg)    GENERAL:alert, no distress and comfortable SKIN: skin color, texture, turgor are normal, no rashes or significant lesions EYES: normal, conjunctiva are pink and non-injected, sclera clear OROPHARYNX:no exudate, no erythema and lips, buccal mucosa, and tongue normal  NECK: supple, thyroid normal size, non-tender, without nodularity LYMPH: no palpable lymphadenopathy in the cervical, supraclavicular, axillary or inguinal LUNGS:rhonchi audible on the left, essentially clear on the right. I bilateral inspiratory wheezing. No rales. No respiratory distress HEART: regular rate & rhythm, 2/6 systolic murmur and no lower extremity edema ABDOMEN:abdomen soft, non-tender and normal bowel sounds, no hepatosplenomegaly Musculoskeletal:no cyanosis of digits and no clubbing  PSYCH: alert & oriented x 3 with fluent speech NEURO: no focal motor/sensory deficits    CMP    Recent Labs Lab 03/13/14 2212 03/14/14 0520  NA 132* 134*  K 4.2 4.2  CL 94* 97  CO2 23 23  GLUCOSE 238* 252*  BUN 17 18  CREATININE 1.39* 1.08  CALCIUM 10.2 10.0        Component Value Date/Time   BILITOT 0.7 02/23/2009 1334       Dg Chest 2 View  03/14/2014   CLINICAL DATA:  LOSS OF CONSCIOUSNESS SHORTNESS OF  BREATH  New Renal having  EXAM: CHEST  2 VIEW  COMPARISON:  NM PET IMAGE RESTAG (PS) SKULL BASE TO THIGH dated 02/20/2009; DG CHEST 2 VIEW dated 12/19/2007  FINDINGS: Cardiac silhouette within the upper limits normal. Calcified mitral valve annulus. Atherosclerotic calcification in the aorta. A 6 cm consolidative masslike left perihilar density with spiculation. There is volume loss in the left hemi thorax. There is flattening of the right hemidiaphragm and blunting of the right costophrenic angle. The lungs otherwise clear. Degenerative changes within the shoulders.  IMPRESSION: Consolidative masslike density in the left perihilar region concerning for soft tissue mass until proven otherwise. Further evaluation with contrast chest CT recommended.  COPD   Electronically Signed   By: Margaree Mackintosh M.D.   On: 03/14/2014 01:18   Ct Angio Chest Pe W/cm &/or Wo Cm  03/14/2014   CLINICAL DATA:  Weakness and shortness of breath for the past 3 weeks. Abnormal chest x-ray.  EXAM: CT ANGIOGRAPHY CHEST WITH CONTRAST  TECHNIQUE: Multidetector CT imaging of the chest was performed using the standard protocol during bolus administration of intravenous contrast. Multiplanar CT image reconstructions and MIPs were obtained to evaluate the vascular anatomy.  CONTRAST:  27mL OMNIPAQUE IOHEXOL 350 MG/ML SOLN  COMPARISON:  PET-CT 02/20/2009.  FINDINGS: Mediastinum: There are no filling defects within the pulmonary arterial tree to suggest underlying pulmonary embolism. There is, however, severe narrowing of several pulmonary artery branches to the left upper lobe, related to a presumed perihilar mass in a left upper lobe as well as left hilar lymphadenopathy. This hilar discretion this hilar lymphadenopathy is difficult to discretely measure on today's examination. No other definite mediastinal or right hilar adenopathy is noted. Heart size is normal. There is no significant pericardial fluid, thickening or pericardial calcification.  There is atherosclerosis of the thoracic aorta, the great vessels of the mediastinum and the coronary arteries, including calcified atherosclerotic plaque in the left main, left anterior descending, left circumflex and right coronary arteries. Thickening  and calcification of the aortic valve. Severe mitral annular calcifications.  Lungs/Pleura: Complete atelectasis of the left upper lobe. In the perihilar aspect of the left upper lobe the tissue exerts mass effect upon the adjacent bronchovascular structures, with marked narrowing of multiple pulmonary artery branches, and complete obliteration of the left upper lobe bronchus best demonstrated on image 50 of series 406. Left lower lobe is reasonably well aerated, although there is some peripheral ground-glass attenuation and subpleural reticulation in the inferior aspect of the left lower lobe.  Upper Abdomen: Calcified gallstone lying dependently in the gallbladder.  Musculoskeletal: There are no aggressive appearing lytic or blastic lesions noted in the visualized portions of the skeleton.  Review of the MIP images confirms the above findings.  IMPRESSION: 1. No evidence of pulmonary embolism. 2. However, there is an apparent left upper lobe perihilar mass with complete occlusion of the left upper lobe bronchus, complete atelectasis of the left upper lobe, and marked narrowing of several left-sided pulmonary artery branches. Findings are highly suspicious for primary bronchogenic malignancy, and correlation with PET-CT and/or bronchoscopy for diagnostic and staging purposes is strongly recommended in the near future. 3. Atherosclerosis, including left main and 3 vessel coronary artery disease. Assessment for potential risk factor modification, dietary therapy or pharmacologic therapy may be warranted, if clinically indicated. 4. There are calcifications of the aortic valve and mitral annulus. Echocardiographic correlation for evaluation of potential valvular  dysfunction may be warranted if clinically indicated. 5. Additional incidental findings, as above. These results were called by telephone at the time of interpretation on 03/14/2014 at 3:13 AM to Dr. Elyn Peers, who verbally acknowledged these results.   Electronically Signed   By: Vinnie Langton M.D.   On: 03/14/2014 03:15      A/P: 80 y.o. male with          1. Non-Hodgkin lymphoma diagnosed in September 2000 with clinical remission following CHOP chemotherapy.        2. Lung mass, suspicious for bronchogenic carcinoma, s/p FOB on 5/18, results pending.  Plans for treatment pending on results.           Of note, patient has family history of colon cancer and himself has a history of colon polyps since 1995, last colonoscopy in 2010, and was due this year.        3.Acute respiratory failure with COPD exacerbation secondary to #2 on nebs, steroids and doxycycline as per Pulmonary       4. Syncope,in the setting of CAD, respiratory issues, cough, diabetes. Patient attributes event to room being hot, and quick positional change. However, he reports that  he has noted "curtains" in his vision-?TIA, intermittently over the           last month without any other neurological symptoms. At some point a CT of the head/MRI will be performed to rule out any abnormalities, including metastatic disease. 5.Diabetes. As per admitting team 6.Chronic enlargement of the right lower leg with a negative venous Doppler 01/13/2009.  7.Abdominal aortic aneurysm noted on a CT in October 2010.  8. Chronic renal failure     Thank you for the referral.  Rondel Jumbo, PA-[name] 03/15/2014 8:56 AM  [name] [name] is well-known to me with a history of non-Hodgkin's lymphoma in remission. He presents with dyspnea/wheezing and a syncope event. A CT and bronchoscopy confirmed a left lung mass, the pathology from a diagnostic bronchoscopy is pending. He most likely has non-small cell lung cancer. I discussed the  probable diagnosis  with him today.  Recommendations: 1. Outpatient staging PET scan and brain MRI 2. Radiation oncology consult 3. Outpatient followup will be arranged at the [facility] within the next one to 2 weeks

## 2014-03-15 NOTE — Discharge Summary (Signed)
Physician Discharge Summary  [name] [name] ZHG:[id number] DOB: 05-23-1933 DOA: 03/13/2014  PCP: [name], MD  Admit date: 03/13/2014 Discharge date: 03/15/2014  Time spent: 35 minutes  Recommendations for Outpatient Follow-up:  1. Follow up with PCP in 1-2 weeks 2. Follow up with Oncologist as scheduled 3. Follow up with Pulmonary as scheduled  Discharge Diagnoses:  Principal Problem:   Syncope Active Problems:   DIABETES MELLITUS, TYPE II, ON INSULIN   HYPERLIPIDEMIA   HYPERTENSION   CAD   PAD (peripheral artery disease)   Cardiac murmur, previously undiagnosed   Syncope and collapse   COPD with exacerbation   Non Hodgkin's lymphoma   Lung mass   Discharge Condition: Stable  Diet recommendation: Diabetic  Filed Weights   03/13/14 2344 03/14/14 0420 03/15/14 0435  Weight: 99.791 kg (220 lb) 94.938 kg (209 lb 4.8 oz) 95 kg (209 lb 7 oz)    History of present illness:  [name] [name] is a 80 y.o. Caucasian male with history of coronary disease, COPD, peripheral vascular disease, hypertension, hyperlipidemia, non-Hodgkin's lymphoma, peripheral neuropathy, type 2 diabetes who presents with the above complaints. Patient reported that he was playing with his great-grandson yesterday afternoon. At approximately 2:15-2:30 p.m., patient took a couple of steps back and fell against a television and bookcase. He was caught by his grandson. Patient soon thereafter regained consciousness. He reports feeling dizzy and lightheaded prior to the episode. Also of note, the air conditioner was not working properly in that room. Patient checked his blood pressure by a wrist machine and his blood pressure was 90/50. He was taken to another room which had air conditioner. Patient checked his blood pressure again during the night was 108/60 during dinner. Given patient's cardiac history, he was brought to the emergency department for further evaluation. Patient reports that his been feeling short of  breath for the last 3 weeks. He also has been feeling dizzy over the same period of time. He denies any fevers, chest pain, abdominal pain, diarrhea, headaches or vision changes. In the emergency department he was found to be wheezy and was started on steroids and breathing treatments for COPD exacerbation. Chest x-ray showed consolidative masslike density in the left perihilar region. Hospitalist service was asked to admit the patient for further care and management.  Hospital Course:  Syncope   -Cardiac enzymes neg x 2 thus far  -CT of the chest with contrast neg for pulmonary embolism.  -Not orthostatic  -Cont IV fluids  -Patient had a 2-D echocardiogram on 12/02/2013 which showed systolic function was normal with EF of 19-62%, grade 1 diastolic dysfunction  -Patient also had a stress test in February of 2015 which showed small and fixed inferoapical defect on perfusion studies.  -Question vasovagal syncope  Left upper lobe perihilar mass with complete occlusion of the left upper lung bronchus  -CT findings concerning for primary bronchogenic malignancy in the setting of patient with history of non-Hodgkin's lymphoma.  -Pt is s/p bronch on 5/18 with results pos for squamous cell carcinoma -Dr. Benay Spice to follow - Pt to follow up with Pulmonary as an outpatient Type 2 diabetes with neuropathy  -Sliding scale insulin. Diabetic diet. Held metformin.  Hyperlipidemia  -Continue home medications.  Hypertension  -BP noted to be initially low  -Stable currently. Cont monitor.  Acute respiratory failure with COPD exacerbation  -Continue nebulizer therapy.  -May be related to left upper lobe perihilar mass.  -On empiric doxycycline  - Prednisone on discharge per [facility]  recs Coronary artery disease and peripheral vascular disease  Continue home medications.  Neuropathy  Continue gabapentin.  BPH  Continue Flomax.  Chronic kidney disease stage III  Renal function at baseline. Continue to  monitor.  Prophylaxis  Lovenox.  Procedures:  Bronch on 5/18  Consultations:  Pulmonary  Oncology  Discharge Exam: Filed Vitals:   03/14/14 2051 03/15/14 0435 03/15/14 0822 03/15/14 1350  BP:  118/63  100/51  Pulse:  62  60  Temp:  97.6 F (36.4 C)  97.5 F (36.4 C)  TempSrc:  Oral  Oral  Resp:  18  20  Height:      Weight:  95 kg (209 lb 7 oz)    SpO2: 96% 95% 94% 95%    General: awake, in nad Cardiovascular: regular, s1, s2 Respiratory: normal resp effort, no wheezing  Discharge Instructions     Medication List         APIDRA SOLOSTAR 100 UNIT/ML Solostar Pen  Generic drug:  Insulin Glulisine  Inject 3-7 Units into the skin See admin instructions. Prn over 250     aspirin 81 MG tablet  Take 160 mg by mouth 2 (two) times daily.     CINNAMON PO  Take 1,000 mg by mouth 2 (two) times daily.     doxycycline 100 MG tablet  Commonly known as:  VIBRA-TABS  Take 1 tablet (100 mg total) by mouth every 12 (twelve) hours.     DULoxetine 30 MG capsule  Commonly known as:  CYMBALTA  Take 30 mg by mouth daily.     fenofibrate micronized 200 MG capsule  Commonly known as:  LOFIBRA  Take 200 mg by mouth daily before breakfast.     finasteride 5 MG tablet  Commonly known as:  PROSCAR  Take 5 mg by mouth daily.     gabapentin 600 MG tablet  Commonly known as:  NEURONTIN  Take 600 mg by mouth 3 (three) times daily.     glucosamine-chondroitin 500-400 MG tablet  Take 1 tablet by mouth 2 (two) times daily.     HUMALOG MIX 75/25 KWIKPEN (75-25) 100 UNIT/ML Kwikpen  Generic drug:  Insulin Lispro Prot & Lispro  Inject 18-30 Units into the skin 2 (two) times daily. 30 units in the morning and 18 in the evening     isosorbide mononitrate 60 MG 24 hr tablet  Commonly known as:  IMDUR  Take 1 tablet (60 mg total) by mouth daily.     lisinopril 20 MG tablet  Commonly known as:  PRINIVIL,ZESTRIL  Take 10 mg by mouth daily.     metFORMIN 1000 MG tablet   Commonly known as:  GLUCOPHAGE  Take 1,000 mg by mouth 2 (two) times daily with a meal.     metoprolol tartrate 25 MG tablet  Commonly known as:  LOPRESSOR  Take 1 tablet (25 mg total) by mouth 2 (two) times daily.     nitroGLYCERIN 0.4 MG/SPRAY spray  Commonly known as:  NITROLINGUAL  Place 1 spray under the tongue every 5 (five) minutes x 3 doses as needed for chest pain.     pioglitazone 30 MG tablet  Commonly known as:  ACTOS  Take 30 mg by mouth daily.     predniSONE 20 MG tablet  Commonly known as:  DELTASONE  Take 2 tablets (40 mg total) by mouth daily with breakfast.  Start taking on:  03/16/2014     rosuvastatin 10 MG tablet  Commonly known as:  CRESTOR  Take 10 mg by mouth at bedtime.     tamsulosin 0.4 MG Caps capsule  Commonly known as:  FLOMAX  Take 0.4 mg by mouth daily.     TYLENOL ARTHRITIS PAIN PO  Take 650 mg by mouth 2 (two) times daily.       No Known Allergies Follow-up Information   Follow up with Rigoberto Noel., MD On 04/06/2014. (at 4pm )    Specialty:  Pulmonary Disease   Contact information:   [address] [address] [phone]        The results of significant diagnostics from this hospitalization (including imaging, microbiology, ancillary and laboratory) are listed below for reference.    Significant Diagnostic Studies: Dg Chest 2 View  03/14/2014   CLINICAL DATA:  LOSS OF CONSCIOUSNESS SHORTNESS OF BREATH  New Renal having  EXAM: CHEST  2 VIEW  COMPARISON:  NM PET IMAGE RESTAG (PS) SKULL BASE TO THIGH dated 02/20/2009; DG CHEST 2 VIEW dated 12/19/2007  FINDINGS: Cardiac silhouette within the upper limits normal. Calcified mitral valve annulus. Atherosclerotic calcification in the aorta. A 6 cm consolidative masslike left perihilar density with spiculation. There is volume loss in the left hemi thorax. There is flattening of the right hemidiaphragm and blunting of the right costophrenic angle. The lungs otherwise clear.  Degenerative changes within the shoulders.  IMPRESSION: Consolidative masslike density in the left perihilar region concerning for soft tissue mass until proven otherwise. Further evaluation with contrast chest CT recommended.  COPD   Electronically Signed   By: Margaree Mackintosh M.D.   On: 03/14/2014 01:18   Ct Angio Chest Pe W/cm &/or Wo Cm  03/14/2014   CLINICAL DATA:  Weakness and shortness of breath for the past 3 weeks. Abnormal chest x-ray.  EXAM: CT ANGIOGRAPHY CHEST WITH CONTRAST  TECHNIQUE: Multidetector CT imaging of the chest was performed using the standard protocol during bolus administration of intravenous contrast. Multiplanar CT image reconstructions and MIPs were obtained to evaluate the vascular anatomy.  CONTRAST:  23mL OMNIPAQUE IOHEXOL 350 MG/ML SOLN  COMPARISON:  PET-CT 02/20/2009.  FINDINGS: Mediastinum: There are no filling defects within the pulmonary arterial tree to suggest underlying pulmonary embolism. There is, however, severe narrowing of several pulmonary artery branches to the left upper lobe, related to a presumed perihilar mass in a left upper lobe as well as left hilar lymphadenopathy. This hilar discretion this hilar lymphadenopathy is difficult to discretely measure on today's examination. No other definite mediastinal or right hilar adenopathy is noted. Heart size is normal. There is no significant pericardial fluid, thickening or pericardial calcification. There is atherosclerosis of the thoracic aorta, the great vessels of the mediastinum and the coronary arteries, including calcified atherosclerotic plaque in the left main, left anterior descending, left circumflex and right coronary arteries. Thickening and calcification of the aortic valve. Severe mitral annular calcifications.  Lungs/Pleura: Complete atelectasis of the left upper lobe. In the perihilar aspect of the left upper lobe the tissue exerts mass effect upon the adjacent bronchovascular structures, with marked  narrowing of multiple pulmonary artery branches, and complete obliteration of the left upper lobe bronchus best demonstrated on image 50 of series 406. Left lower lobe is reasonably well aerated, although there is some peripheral ground-glass attenuation and subpleural reticulation in the inferior aspect of the left lower lobe.  Upper Abdomen: Calcified gallstone lying dependently in the gallbladder.  Musculoskeletal: There are no aggressive appearing lytic or blastic lesions noted in the visualized portions  of the skeleton.  Review of the MIP images confirms the above findings.  IMPRESSION: 1. No evidence of pulmonary embolism. 2. However, there is an apparent left upper lobe perihilar mass with complete occlusion of the left upper lobe bronchus, complete atelectasis of the left upper lobe, and marked narrowing of several left-sided pulmonary artery branches. Findings are highly suspicious for primary bronchogenic malignancy, and correlation with PET-CT and/or bronchoscopy for diagnostic and staging purposes is strongly recommended in the near future. 3. Atherosclerosis, including left main and 3 vessel coronary artery disease. Assessment for potential risk factor modification, dietary therapy or pharmacologic therapy may be warranted, if clinically indicated. 4. There are calcifications of the aortic valve and mitral annulus. Echocardiographic correlation for evaluation of potential valvular dysfunction may be warranted if clinically indicated. 5. Additional incidental findings, as above. These results were called by telephone at the time of interpretation on 03/14/2014 at 3:13 AM to Dr. Elyn Peers, who verbally acknowledged these results.   Electronically Signed   By: Vinnie Langton M.D.   On: 03/14/2014 03:15    Microbiology: Recent Results (from the past 240 hour(s))  AFB CULTURE WITH SMEAR     Status: None   Collection Time    03/14/14  3:21 PM      Result Value Ref Range Status   Specimen  Description BRONCHIAL ALVEOLAR LAVAGE   Final   Special Requests NONE   Final   Acid Fast Smear     Final   Value: NO ACID FAST BACILLI SEEN     Performed at [facility]   Culture     Final   Value: CULTURE WILL BE EXAMINED FOR 6 WEEKS BEFORE ISSUING A FINAL REPORT     Performed at [facility]   Report Status PENDING   Incomplete  CULTURE, BAL-QUANTITATIVE     Status: None   Collection Time    03/14/14  3:21 PM      Result Value Ref Range Status   Specimen Description BRONCHIAL ALVEOLAR LAVAGE   Final   Special Requests NONE   Final   Gram Stain     Final   Value: RARE WBC PRESENT, PREDOMINANTLY PMN     NO SQUAMOUS EPITHELIAL CELLS SEEN     NO ORGANISMS SEEN     Performed at [facility] PENDING   Incomplete   Culture     Final   Value: Culture reincubated for better growth     Performed at [facility]   Report Status PENDING   Incomplete  FUNGUS CULTURE W SMEAR     Status: None   Collection Time    03/14/14  3:21 PM      Result Value Ref Range Status   Specimen Description BRONCHIAL ALVEOLAR LAVAGE   Final   Special Requests NONE   Final   Fungal Smear     Final   Value: NO YEAST OR FUNGAL ELEMENTS SEEN     Performed at [facility]   Culture     Final   Value: CULTURE IN PROGRESS FOR FOUR WEEKS     Performed at [facility]   Report Status PENDING   Incomplete     Labs: Basic Metabolic Panel:  Recent Labs Lab 03/13/14 2212 03/14/14 0520  NA 132* 134*  K 4.2 4.2  CL 94* 97  CO2 23 23  GLUCOSE 238* 252*  BUN 17 18  CREATININE 1.39* 1.08  CALCIUM  10.2 10.0   Liver Function Tests: No results found for this basename: AST, ALT, ALKPHOS, BILITOT, PROT, ALBUMIN,  in the last 168 hours No results found for this basename: LIPASE, AMYLASE,  in the last 168 hours No results found for this basename: AMMONIA,  in the last 168 hours CBC:  Recent Labs Lab 03/13/14 2212 03/14/14 0520  WBC 5.7 6.8   HGB 12.3* 11.7*  HCT 36.9* 36.1*  MCV 85.8 86.2  PLT 229 215   Cardiac Enzymes:  Recent Labs Lab 03/15/14 0530 03/15/14 1140  TROPONINI <0.30 <0.30   BNP: BNP (last 3 results) No results found for this basename: PROBNP,  in the last 8760 hours CBG:  Recent Labs Lab 03/14/14 1701 03/14/14 2125 03/15/14 0623 03/15/14 1132 03/15/14 1623  GLUCAP 171* 232* 249* 204* 284*       Signed:  Donne Hazel  [facility] 03/15/2014, 5:49 PM

## 2014-03-15 NOTE — Progress Notes (Signed)
Inpatient Diabetes Program Recommendations  AACE/ADA: New Consensus Statement on Inpatient Glycemic Control (2013)  Target Ranges:  Prepandial:   less than 140 mg/dL      Peak postprandial:   less than 180 mg/dL (1-2 hours)      Critically ill patients:  140 - 180 mg/dL  Results for MOZES, SAGAR (MRN [id number]) as of 03/15/2014 09:04  Ref. Range 03/14/2014 07:21 03/14/2014 11:34 03/14/2014 17:01 03/14/2014 21:25 03/15/2014 06:23  Glucose-Capillary Latest Range: 70-99 mg/dL 250 (H) 304 (H) 171 (H) 232 (H) 249 (H)   Inpatient Diabetes Program Recommendations Correction (SSI): consider increasing to moderate or resistant scale during steroid therapy Thank you  Raoul Pitch BSN, RN,CDE Inpatient Diabetes Coordinator [phone] (team pager)

## 2014-03-15 NOTE — Progress Notes (Signed)
Pt discharged per written order. Telemetry and IV discontinued. All d/c instructions/orders/follow up appts/medications/follow up care discussed. Time allowed for questions/concerns. Pt states he has none. Pt requests to walk to the main entrance. Stann Ore will accompany. His grandson will transport home. Soyla Dryer, RN

## 2014-03-16 ENCOUNTER — Telehealth: Payer: Self-pay | Admitting: Oncology

## 2014-03-16 ENCOUNTER — Other Ambulatory Visit: Payer: Self-pay | Admitting: Oncology

## 2014-03-16 DIAGNOSIS — C3490 Malignant neoplasm of unspecified part of unspecified bronchus or lung: Secondary | ICD-10-CM

## 2014-03-16 NOTE — Telephone Encounter (Signed)
lmonvm advising the pt of his appt with dr Benay Spice in June. S/w karen from [facility] regarding this pt needing an appt with dr [name]. Per karen she see's the referral and she will be calling the pt.

## 2014-03-17 LAB — CULTURE, BAL-QUANTITATIVE W GRAM STAIN

## 2014-03-17 LAB — CULTURE, BAL-QUANTITATIVE: Colony Count: 60000

## 2014-03-21 ENCOUNTER — Encounter (HOSPITAL_COMMUNITY): Payer: Self-pay | Admitting: Emergency Medicine

## 2014-03-21 ENCOUNTER — Emergency Department (HOSPITAL_COMMUNITY): Payer: Medicare Other

## 2014-03-21 ENCOUNTER — Observation Stay (HOSPITAL_COMMUNITY)
Admission: EM | Admit: 2014-03-21 | Discharge: 2014-03-23 | Disposition: A | Discharge status: Home / Self Care | Payer: Medicare Other | Attending: Internal Medicine | Admitting: Internal Medicine

## 2014-03-21 DIAGNOSIS — R011 Cardiac murmur, unspecified: Secondary | ICD-10-CM

## 2014-03-21 DIAGNOSIS — K8020 Calculus of gallbladder without cholecystitis without obstruction: Secondary | ICD-10-CM

## 2014-03-21 DIAGNOSIS — R197 Diarrhea, unspecified: Secondary | ICD-10-CM

## 2014-03-21 DIAGNOSIS — Z794 Long term (current) use of insulin: Secondary | ICD-10-CM | POA: Insufficient documentation

## 2014-03-21 DIAGNOSIS — I714 Abdominal aortic aneurysm, without rupture, unspecified: Secondary | ICD-10-CM

## 2014-03-21 DIAGNOSIS — G609 Hereditary and idiopathic neuropathy, unspecified: Secondary | ICD-10-CM | POA: Insufficient documentation

## 2014-03-21 DIAGNOSIS — K5730 Diverticulosis of large intestine without perforation or abscess without bleeding: Secondary | ICD-10-CM

## 2014-03-21 DIAGNOSIS — I350 Nonrheumatic aortic (valve) stenosis: Secondary | ICD-10-CM | POA: Diagnosis present

## 2014-03-21 DIAGNOSIS — E161 Other hypoglycemia: Secondary | ICD-10-CM

## 2014-03-21 DIAGNOSIS — E119 Type 2 diabetes mellitus without complications: Secondary | ICD-10-CM

## 2014-03-21 DIAGNOSIS — J449 Chronic obstructive pulmonary disease, unspecified: Secondary | ICD-10-CM | POA: Insufficient documentation

## 2014-03-21 DIAGNOSIS — E1169 Type 2 diabetes mellitus with other specified complication: Principal | ICD-10-CM | POA: Insufficient documentation

## 2014-03-21 DIAGNOSIS — C8590 Non-Hodgkin lymphoma, unspecified, unspecified site: Secondary | ICD-10-CM

## 2014-03-21 DIAGNOSIS — E1149 Type 2 diabetes mellitus with other diabetic neurological complication: Secondary | ICD-10-CM | POA: Diagnosis present

## 2014-03-21 DIAGNOSIS — Z87891 Personal history of nicotine dependence: Secondary | ICD-10-CM | POA: Insufficient documentation

## 2014-03-21 DIAGNOSIS — E785 Hyperlipidemia, unspecified: Secondary | ICD-10-CM | POA: Insufficient documentation

## 2014-03-21 DIAGNOSIS — I359 Nonrheumatic aortic valve disorder, unspecified: Secondary | ICD-10-CM

## 2014-03-21 DIAGNOSIS — R918 Other nonspecific abnormal finding of lung field: Secondary | ICD-10-CM

## 2014-03-21 DIAGNOSIS — Z7982 Long term (current) use of aspirin: Secondary | ICD-10-CM | POA: Insufficient documentation

## 2014-03-21 DIAGNOSIS — J4489 Other specified chronic obstructive pulmonary disease: Secondary | ICD-10-CM | POA: Insufficient documentation

## 2014-03-21 DIAGNOSIS — I10 Essential (primary) hypertension: Secondary | ICD-10-CM

## 2014-03-21 DIAGNOSIS — I2510 Atherosclerotic heart disease of native coronary artery without angina pectoris: Secondary | ICD-10-CM | POA: Insufficient documentation

## 2014-03-21 DIAGNOSIS — Z48812 Encounter for surgical aftercare following surgery on the circulatory system: Secondary | ICD-10-CM

## 2014-03-21 DIAGNOSIS — K5289 Other specified noninfective gastroenteritis and colitis: Secondary | ICD-10-CM

## 2014-03-21 DIAGNOSIS — I739 Peripheral vascular disease, unspecified: Secondary | ICD-10-CM | POA: Insufficient documentation

## 2014-03-21 DIAGNOSIS — K649 Unspecified hemorrhoids: Secondary | ICD-10-CM

## 2014-03-21 DIAGNOSIS — E162 Hypoglycemia, unspecified: Secondary | ICD-10-CM

## 2014-03-21 DIAGNOSIS — G589 Mononeuropathy, unspecified: Secondary | ICD-10-CM

## 2014-03-21 DIAGNOSIS — J441 Chronic obstructive pulmonary disease with (acute) exacerbation: Secondary | ICD-10-CM

## 2014-03-21 DIAGNOSIS — R222 Localized swelling, mass and lump, trunk: Secondary | ICD-10-CM | POA: Insufficient documentation

## 2014-03-21 DIAGNOSIS — Z87898 Personal history of other specified conditions: Secondary | ICD-10-CM

## 2014-03-21 DIAGNOSIS — Z86010 Personal history of colonic polyps: Secondary | ICD-10-CM

## 2014-03-21 DIAGNOSIS — R55 Syncope and collapse: Secondary | ICD-10-CM

## 2014-03-21 LAB — GLUCOSE, CAPILLARY
GLUCOSE-CAPILLARY: 233 mg/dL — AB (ref 70–99)
Glucose-Capillary: 166 mg/dL — ABNORMAL HIGH (ref 70–99)

## 2014-03-21 LAB — BASIC METABOLIC PANEL
BUN: 14 mg/dL (ref 6–23)
CALCIUM: 10.1 mg/dL (ref 8.4–10.5)
CHLORIDE: 99 meq/L (ref 96–112)
CO2: 25 mEq/L (ref 19–32)
CREATININE: 1.07 mg/dL (ref 0.50–1.35)
GFR calc non Af Amer: 64 mL/min — ABNORMAL LOW (ref >90)
GFR, EST AFRICAN AMERICAN: 75 mL/min — AB (ref >90)
Glucose, Bld: 52 mg/dL — ABNORMAL LOW (ref 70–99)
Potassium: 3.9 mEq/L (ref 3.7–5.3)
Sodium: 137 mEq/L (ref 137–147)

## 2014-03-21 LAB — CBC WITH DIFFERENTIAL/PLATELET
BASOS ABS: 0 10*3/uL (ref 0.0–0.1)
Basophils Relative: 0 % (ref 0–1)
Eosinophils Absolute: 0.1 10*3/uL (ref 0.0–0.7)
Eosinophils Relative: 1 % (ref 0–5)
HEMATOCRIT: 36.4 % — AB (ref 39.0–52.0)
Hemoglobin: 12 g/dL — ABNORMAL LOW (ref 13.0–17.0)
Lymphocytes Relative: 28 % (ref 12–46)
Lymphs Abs: 2.9 10*3/uL (ref 0.7–4.0)
MCH: 28 pg (ref 26.0–34.0)
MCHC: 33 g/dL (ref 30.0–36.0)
MCV: 85 fL (ref 78.0–100.0)
Monocytes Absolute: 1.2 10*3/uL — ABNORMAL HIGH (ref 0.1–1.0)
Monocytes Relative: 12 % (ref 3–12)
NEUTROS ABS: 6.2 10*3/uL (ref 1.7–7.7)
Neutrophils Relative %: 59 % (ref 43–77)
PLATELETS: 258 10*3/uL (ref 150–400)
RBC: 4.28 MIL/uL (ref 4.22–5.81)
RDW: 15 % (ref 11.5–15.5)
WBC: 10.4 10*3/uL (ref 4.0–10.5)

## 2014-03-21 LAB — URINALYSIS, ROUTINE W REFLEX MICROSCOPIC
Bilirubin Urine: NEGATIVE
Glucose, UA: 500 mg/dL — AB
HGB URINE DIPSTICK: NEGATIVE
KETONES UR: NEGATIVE mg/dL
Leukocytes, UA: NEGATIVE
NITRITE: NEGATIVE
PROTEIN: NEGATIVE mg/dL
Specific Gravity, Urine: 1.02 (ref 1.005–1.030)
UROBILINOGEN UA: 0.2 mg/dL (ref 0.0–1.0)
pH: 5 (ref 5.0–8.0)

## 2014-03-21 LAB — TROPONIN I

## 2014-03-21 LAB — CBG MONITORING, ED: Glucose-Capillary: 34 mg/dL — CL (ref 70–99)

## 2014-03-21 LAB — POC OCCULT BLOOD, ED: FECAL OCCULT BLD: NEGATIVE

## 2014-03-21 LAB — TSH: TSH: 0.801 u[IU]/mL (ref 0.350–4.500)

## 2014-03-21 MED ORDER — METFORMIN HCL 500 MG PO TABS
1000.0000 mg | ORAL_TABLET | Freq: Two times a day (BID) | ORAL | Status: DC
  Administered 2014-03-21 – 2014-03-23 (×4): 1000 mg via ORAL
  Filled 2014-03-21 (×6): qty 2

## 2014-03-21 MED ORDER — SODIUM CHLORIDE 0.9 % IV SOLN: 250.0000 mL | INTRAVENOUS | Status: DC | PRN

## 2014-03-21 MED ORDER — SODIUM CHLORIDE 0.9 % IJ SOLN
3.0000 mL | Freq: Two times a day (BID) | INTRAMUSCULAR | Status: DC
  Administered 2014-03-22: 3 mL via INTRAVENOUS

## 2014-03-21 MED ORDER — DEXTROSE 50 % IV SOLN
1.0000 | Freq: Once | INTRAVENOUS | Status: AC
  Administered 2014-03-21: 50 mL via INTRAVENOUS
  Filled 2014-03-21: qty 50

## 2014-03-21 MED ORDER — DULOXETINE HCL 30 MG PO CPEP
30.0000 mg | ORAL_CAPSULE | Freq: Every day | ORAL | Status: DC
  Administered 2014-03-22 – 2014-03-23 (×2): 30 mg via ORAL
  Filled 2014-03-21 (×2): qty 1

## 2014-03-21 MED ORDER — ASPIRIN EC 81 MG PO TBEC
162.0000 mg | DELAYED_RELEASE_TABLET | Freq: Two times a day (BID) | ORAL | Status: DC
  Administered 2014-03-21 – 2014-03-23 (×4): 162 mg via ORAL
  Filled 2014-03-21 (×5): qty 2

## 2014-03-21 MED ORDER — ATORVASTATIN CALCIUM 20 MG PO TABS
20.0000 mg | ORAL_TABLET | Freq: Every day | ORAL | Status: DC
  Administered 2014-03-21 – 2014-03-22 (×2): 20 mg via ORAL
  Filled 2014-03-21 (×3): qty 1

## 2014-03-21 MED ORDER — ACETAMINOPHEN 325 MG PO TABS: 650.0000 mg | ORAL_TABLET | Freq: Four times a day (QID) | ORAL | Status: DC | PRN

## 2014-03-21 MED ORDER — FINASTERIDE 5 MG PO TABS
5.0000 mg | ORAL_TABLET | Freq: Every day | ORAL | Status: DC
  Administered 2014-03-22 – 2014-03-23 (×2): 5 mg via ORAL
  Filled 2014-03-21 (×2): qty 1

## 2014-03-21 MED ORDER — TAMSULOSIN HCL 0.4 MG PO CAPS
0.4000 mg | ORAL_CAPSULE | Freq: Every day | ORAL | Status: DC
  Administered 2014-03-21 – 2014-03-23 (×3): 0.4 mg via ORAL
  Filled 2014-03-21 (×3): qty 1

## 2014-03-21 MED ORDER — ONDANSETRON HCL 4 MG/2ML IJ SOLN: 4.0000 mg | Freq: Four times a day (QID) | INTRAMUSCULAR | Status: DC | PRN

## 2014-03-21 MED ORDER — SODIUM CHLORIDE 0.9 % IV BOLUS (SEPSIS)
250.0000 mL | Freq: Once | INTRAVENOUS | Status: AC
  Administered 2014-03-21: 250 mL via INTRAVENOUS

## 2014-03-21 MED ORDER — ONDANSETRON HCL 4 MG PO TABS: 4.0000 mg | ORAL_TABLET | Freq: Four times a day (QID) | ORAL | Status: DC | PRN

## 2014-03-21 MED ORDER — GABAPENTIN 600 MG PO TABS
600.0000 mg | ORAL_TABLET | Freq: Three times a day (TID) | ORAL | Status: DC
  Administered 2014-03-21 – 2014-03-23 (×6): 600 mg via ORAL
  Filled 2014-03-21 (×8): qty 1

## 2014-03-21 MED ORDER — SODIUM CHLORIDE 0.9 % IJ SOLN: 3.0000 mL | INTRAMUSCULAR | Status: DC | PRN

## 2014-03-21 MED ORDER — SODIUM CHLORIDE 0.9 % IJ SOLN
3.0000 mL | Freq: Two times a day (BID) | INTRAMUSCULAR | Status: DC
  Administered 2014-03-21 – 2014-03-22 (×2): 3 mL via INTRAVENOUS

## 2014-03-21 MED ORDER — ASPIRIN 81 MG PO TABS: 160.0000 mg | ORAL_TABLET | Freq: Two times a day (BID) | ORAL | Status: DC

## 2014-03-21 MED ORDER — SODIUM CHLORIDE 0.9 % IV BOLUS (SEPSIS)
500.0000 mL | Freq: Once | INTRAVENOUS | Status: AC
  Administered 2014-03-21: 500 mL via INTRAVENOUS

## 2014-03-21 MED ORDER — ACETAMINOPHEN 650 MG RE SUPP: 650.0000 mg | Freq: Four times a day (QID) | RECTAL | Status: DC | PRN

## 2014-03-21 MED ORDER — INSULIN ASPART 100 UNIT/ML ~~LOC~~ SOLN
0.0000 [IU] | Freq: Three times a day (TID) | SUBCUTANEOUS | Status: DC
  Administered 2014-03-22: 5 [IU] via SUBCUTANEOUS
  Administered 2014-03-22 – 2014-03-23 (×4): 3 [IU] via SUBCUTANEOUS

## 2014-03-21 MED ORDER — INSULIN ASPART 100 UNIT/ML ~~LOC~~ SOLN
0.0000 [IU] | Freq: Every day | SUBCUTANEOUS | Status: DC
  Administered 2014-03-21: 2 [IU] via SUBCUTANEOUS

## 2014-03-21 NOTE — ED Provider Notes (Addendum)
CSN: [id number]     Arrival date & time 03/21/14  1350 History   First MD Initiated Contact with Patient 03/21/14 1430     Chief Complaint  Patient presents with  . Fall     (Consider location/radiation/quality/duration/timing/severity/associated sxs/prior Treatment) HPI Complains of syncopal event today. Syncope occurred approximately one hour after taking treatment with sublingual nitroglycerin spray for chest pain that he experienced this morning. Chest pain lasted 30 minutes and resolve 5 minutes after taking nitroglycerin spray. [place] patient complains of generalized weakness. No other associated symptoms as a result of syncope he did strike his head and suffered abrasion to left knee and EMS treated patient with peripheral IV of normal saline and received 200 mL lactated Ringer at bolus prior to arrival Past Medical History  Diagnosis Date  . Peripheral arterial disease   . Diabetes mellitus without complication   . CAD (coronary artery disease)   . Hyperlipidemia   . Hypertension   . Non Hodgkin's lymphoma   . Peripheral neuropathy   . COPD (chronic obstructive pulmonary disease)   . History of lower GI bleeding   . Substance abuse    Past Surgical History  Procedure Laterality Date  . Fracture surgery  2009    Left ankle  . Pr vein bypass graft,aorto-fem-pop  2005    Right common femoral to BK popliteal BPG  . Pr vein bypass graft,aorto-fem-pop  01-27-05    Revision of Right fem-pop BPG  . Cardiac catheterization  1990's  . Video bronchoscopy Bilateral 03/14/2014    Procedure: VIDEO BRONCHOSCOPY WITH FLUORO;  Surgeon: Rigoberto Noel, MD;  Location: [place];  Service: Cardiopulmonary;  Laterality: Bilateral;   Family History  Problem Relation Age of Onset  . Cancer Mother     Colon  . Heart attack Mother   . Stroke Father   . Hyperlipidemia Father   . Hyperlipidemia Sister   . Hyperlipidemia Brother   . Hyperlipidemia Daughter   . Hypertension Son    History   Substance Use Topics  . Smoking status: Former Smoker    Types: Cigarettes    Quit date: 10/29/2003  . Smokeless tobacco: Never Used  . Alcohol Use: No    Review of Systems  HENT: Negative.   Respiratory: Negative.   Cardiovascular: Positive for chest pain.  Gastrointestinal: Negative.   Musculoskeletal: Negative.   Skin: Positive for wound.       Scalp hematoma abrasion to knee  Neurological: Positive for weakness.  Psychiatric/Behavioral: Negative.   All other systems reviewed and are negative.     Allergies  Review of patient's allergies indicates no known allergies.  Home Medications   Prior to Admission medications   Medication Sig Start Date End Date Taking? Authorizing Provider  Acetaminophen (TYLENOL ARTHRITIS PAIN PO) Take 650 mg by mouth 2 (two) times daily.    Yes Historical Provider, MD  aspirin 81 MG tablet Take 160 mg by mouth 2 (two) times daily.   Yes Historical Provider, MD  CINNAMON PO Take 1,000 mg by mouth 2 (two) times daily.   Yes Historical Provider, MD  DULoxetine (CYMBALTA) 30 MG capsule Take 30 mg by mouth daily. 04/27/13  Yes Historical Provider, MD  fenofibrate micronized (LOFIBRA) 200 MG capsule Take 200 mg by mouth daily before breakfast.   Yes Historical Provider, MD  doxycycline (VIBRA-TABS) 100 MG tablet Take 1 tablet (100 mg total) by mouth every 12 (twelve) hours. 03/15/14   Donne Hazel, MD  finasteride [facility])  5 MG tablet Take 5 mg by mouth daily.    Historical Provider, MD  gabapentin (NEURONTIN) 600 MG tablet Take 600 mg by mouth 3 (three) times daily.     Historical Provider, MD  glucosamine-chondroitin 500-400 MG tablet Take 1 tablet by mouth 2 (two) times daily.    Historical Provider, MD  HUMALOG MIX 75/25 KWIKPEN (75-25) 100 UNIT/ML SUPN Inject 18-30 Units into the skin 2 (two) times daily. 30 units in the morning and 18 in the evening 04/27/13   Historical Provider, MD  Insulin Glulisine (APIDRA SOLOSTAR) 100 UNIT/ML Solostar Pen  Inject 3-7 Units into the skin See admin instructions. Prn over 250    Historical Provider, MD  isosorbide mononitrate (IMDUR) 60 MG 24 hr tablet Take 1 tablet (60 mg total) by mouth daily. 01/06/14   Mihai Croitoru, MD  lisinopril (PRINIVIL,ZESTRIL) 20 MG tablet Take 10 mg by mouth daily.    Historical Provider, MD  metFORMIN (GLUCOPHAGE) 1000 MG tablet Take 1,000 mg by mouth 2 (two) times daily with a meal.    Historical Provider, MD  metoprolol tartrate (LOPRESSOR) 25 MG tablet Take 1 tablet (25 mg total) by mouth 2 (two) times daily. 11/24/13   Mihai Croitoru, MD  nitroGLYCERIN (NITROLINGUAL) 0.4 MG/SPRAY spray Place 1 spray under the tongue every 5 (five) minutes x 3 doses as needed for chest pain. 11/24/13   Mihai Croitoru, MD  pioglitazone (ACTOS) 30 MG tablet Take 30 mg by mouth daily.    Historical Provider, MD  predniSONE (DELTASONE) 20 MG tablet Take 2 tablets (40 mg total) by mouth daily with breakfast. 03/16/14   Donne Hazel, MD  rosuvastatin (CRESTOR) 10 MG tablet Take 10 mg by mouth at bedtime.     Historical Provider, MD  Tamsulosin HCl (FLOMAX) 0.4 MG CAPS Take 0.4 mg by mouth daily.    Historical Provider, MD   BP 88/42  Pulse 69  Temp(Src) 97.8 F (36.6 C) (Oral)  Ht 5' 11.5" (1.816 m)  Wt 209 lb (94.802 kg)  BMI 28.75 kg/m2  SpO2 96% Physical Exam  Nursing note and vitals reviewed. Constitutional: He is oriented to person, place, and time. He appears well-developed and well-nourished.  HENT:  Golf ball size hematoma in the left temporal area with overlying abraision  Eyes: Conjunctivae are normal. Pupils are equal, round, and reactive to light.  Neck: Neck supple. No tracheal deviation present. No thyromegaly present.  Cardiovascular: Normal rate and regular rhythm.   No murmur heard. Pulmonary/Chest: Effort normal and breath sounds normal.  Abdominal: Soft. Bowel sounds are normal. He exhibits no distension. There is no tenderness.  Genitourinary: Rectum normal.  Guaiac negative stool.  Musculoskeletal: Normal range of motion. He exhibits no edema and no tenderness.  Neurological: He is alert and oriented to person, place, and time. No cranial nerve deficit. Coordination normal.  Moves all extremities Glasgow Coma Score 15  Skin: Skin is warm and dry. No rash noted.   several tiny abrasions anterior aspect the left knee. No soft tissue swelling or deformity. No tenderness   Psychiatric: He has a normal mood and affect.    ED Course  Procedures (including critical care time) Labs Review Labs Reviewed - No data to display  Imaging Review No results found.   EKG Interpretation   Date/Time:  Monday Mar 21 2014 14:10:41 EDT Ventricular Rate:  69 PR Interval:  171 QRS Duration: 127 QT Interval:  417 QTC Calculation: 447 R Axis:   -40 Text Interpretation:  Sinus rhythm Ventricular premature complex Left  bundle branch block No significant change since last tracing Confirmed by  Shresta Risden  MD, Wilton Thrall [phone]) on 03/21/2014 3:18:25 PM     1545 PM a second intravenous bolus of normal saline was ordered as patient remained hypotensive he is alert Glasgow Coma Score 15. Chest xray viewed by me. Results for orders placed during the hospital encounter of 19/14/78  BASIC METABOLIC PANEL      Result Value Ref Range   Sodium 137  137 - 147 mEq/L   Potassium 3.9  3.7 - 5.3 mEq/L   Chloride 99  96 - 112 mEq/L   CO2 25  19 - 32 mEq/L   Glucose, Bld 52 (*) 70 - 99 mg/dL   BUN 14  6 - 23 mg/dL   Creatinine, Ser 1.07  0.50 - 1.35 mg/dL   Calcium 10.1  8.4 - 10.5 mg/dL   GFR calc non Af Amer 64 (*) >90 mL/min   GFR calc Af Amer 75 (*) >90 mL/min  CBC WITH DIFFERENTIAL      Result Value Ref Range   WBC 10.4  4.0 - 10.5 K/uL   RBC 4.28  4.22 - 5.81 MIL/uL   Hemoglobin 12.0 (*) 13.0 - 17.0 g/dL   HCT 36.4 (*) 39.0 - 52.0 %   MCV 85.0  78.0 - 100.0 fL   MCH 28.0  26.0 - 34.0 pg   MCHC 33.0  30.0 - 36.0 g/dL   RDW 15.0  11.5 - 15.5 %   Platelets 258   150 - 400 K/uL   Neutrophils Relative % 59  43 - 77 %   Neutro Abs 6.2  1.7 - 7.7 K/uL   Lymphocytes Relative 28  12 - 46 %   Lymphs Abs 2.9  0.7 - 4.0 K/uL   Monocytes Relative 12  3 - 12 %   Monocytes Absolute 1.2 (*) 0.1 - 1.0 K/uL   Eosinophils Relative 1  0 - 5 %   Eosinophils Absolute 0.1  0.0 - 0.7 K/uL   Basophils Relative 0  0 - 1 %   Basophils Absolute 0.0  0.0 - 0.1 K/uL  TROPONIN I      Result Value Ref Range   Troponin I <0.30  <0.30 ng/mL  POC OCCULT BLOOD, ED      Result Value Ref Range   Fecal Occult Bld NEGATIVE  NEGATIVE   Dg Chest 2 View  03/14/2014   CLINICAL DATA:  LOSS OF CONSCIOUSNESS SHORTNESS OF BREATH  New Renal having  EXAM: CHEST  2 VIEW  COMPARISON:  NM PET IMAGE RESTAG (PS) SKULL BASE TO THIGH dated 02/20/2009; DG CHEST 2 VIEW dated 12/19/2007  FINDINGS: Cardiac silhouette within the upper limits normal. Calcified mitral valve annulus. Atherosclerotic calcification in the aorta. A 6 cm consolidative masslike left perihilar density with spiculation. There is volume loss in the left hemi thorax. There is flattening of the right hemidiaphragm and blunting of the right costophrenic angle. The lungs otherwise clear. Degenerative changes within the shoulders.  IMPRESSION: Consolidative masslike density in the left perihilar region concerning for soft tissue mass until proven otherwise. Further evaluation with contrast chest CT recommended.  COPD   Electronically Signed   By: Margaree Mackintosh M.D.   On: 03/14/2014 01:18   Ct Head Wo Contrast  03/21/2014   CLINICAL DATA:  Fall, scalp hematoma  EXAM: CT HEAD WITHOUT CONTRAST  TECHNIQUE: Contiguous axial images were obtained from the base of  the skull through the vertex without intravenous contrast.  COMPARISON:  CT PET scan 02/20/2009  FINDINGS: No skull fracture is noted. Paranasal sinuses and mastoid air cells are unremarkable. Mild cerebral atrophy again noted. Atherosclerotic calcifications of carotid siphon. No  intracranial hemorrhage, mass effect or midline shift. No acute cortical infarction. No mass lesion is noted on this unenhanced scan.  IMPRESSION: No acute intracranial abnormality.  Mild cerebral atrophy.   Electronically Signed   By: Lahoma Crocker M.D.   On: 03/21/2014 15:41   Ct Angio Chest Pe W/cm &/or Wo Cm  03/14/2014   CLINICAL DATA:  Weakness and shortness of breath for the past 3 weeks. Abnormal chest x-ray.  EXAM: CT ANGIOGRAPHY CHEST WITH CONTRAST  TECHNIQUE: Multidetector CT imaging of the chest was performed using the standard protocol during bolus administration of intravenous contrast. Multiplanar CT image reconstructions and MIPs were obtained to evaluate the vascular anatomy.  CONTRAST:  90mL OMNIPAQUE IOHEXOL 350 MG/ML SOLN  COMPARISON:  PET-CT 02/20/2009.  FINDINGS: Mediastinum: There are no filling defects within the pulmonary arterial tree to suggest underlying pulmonary embolism. There is, however, severe narrowing of several pulmonary artery branches to the left upper lobe, related to a presumed perihilar mass in a left upper lobe as well as left hilar lymphadenopathy. This hilar discretion this hilar lymphadenopathy is difficult to discretely measure on today's examination. No other definite mediastinal or right hilar adenopathy is noted. Heart size is normal. There is no significant pericardial fluid, thickening or pericardial calcification. There is atherosclerosis of the thoracic aorta, the great vessels of the mediastinum and the coronary arteries, including calcified atherosclerotic plaque in the left main, left anterior descending, left circumflex and right coronary arteries. Thickening and calcification of the aortic valve. Severe mitral annular calcifications.  Lungs/Pleura: Complete atelectasis of the left upper lobe. In the perihilar aspect of the left upper lobe the tissue exerts mass effect upon the adjacent bronchovascular structures, with marked narrowing of multiple pulmonary  artery branches, and complete obliteration of the left upper lobe bronchus best demonstrated on image 50 of series 406. Left lower lobe is reasonably well aerated, although there is some peripheral ground-glass attenuation and subpleural reticulation in the inferior aspect of the left lower lobe.  Upper Abdomen: Calcified gallstone lying dependently in the gallbladder.  Musculoskeletal: There are no aggressive appearing lytic or blastic lesions noted in the visualized portions of the skeleton.  Review of the MIP images confirms the above findings.  IMPRESSION: 1. No evidence of pulmonary embolism. 2. However, there is an apparent left upper lobe perihilar mass with complete occlusion of the left upper lobe bronchus, complete atelectasis of the left upper lobe, and marked narrowing of several left-sided pulmonary artery branches. Findings are highly suspicious for primary bronchogenic malignancy, and correlation with PET-CT and/or bronchoscopy for diagnostic and staging purposes is strongly recommended in the near future. 3. Atherosclerosis, including left main and 3 vessel coronary artery disease. Assessment for potential risk factor modification, dietary therapy or pharmacologic therapy may be warranted, if clinically indicated. 4. There are calcifications of the aortic valve and mitral annulus. Echocardiographic correlation for evaluation of potential valvular dysfunction may be warranted if clinically indicated. 5. Additional incidental findings, as above. These results were called by telephone at the time of interpretation on 03/14/2014 at 3:13 AM to Dr. Elyn Peers, who verbally acknowledged these results.   Electronically Signed   By: Vinnie Langton M.D.   On: 03/14/2014 03:15   Dg Chest Portable 1  View  03/21/2014   CLINICAL DATA:  80 year old male with shortness of breath following fall.  EXAM: PORTABLE CHEST - 1 VIEW  COMPARISON:  03/14/2014 chest radiograph and CT.  FINDINGS: A left perihilar mass is  again noted and relatively unchanged.  New haziness overlying the left hemi thorax may represent a layering effusion.  There is no evidence of pneumothorax or right pleural effusion.  The right lung is clear.  No acute bony abnormalities are identified.  IMPRESSION: New haziness overlying the left hemi thorax which could represent a layering pleural effusion.  Unchanged right perihilar mass.   Electronically Signed   By: Hassan Rowan M.D.   On: 03/21/2014 15:18    MDM  Concern the patient's hypotension has led to a syncopal event and may have led to his anginal type chest pain earlier today. It's unlikely that he would have a syncopal event and remained hypotensive one hour after taking one sublingual nitroglycerin. Patient noted to be mildly hypoglycemic with blood sugar 52 Final diagnoses:  None   spoke with Dr. Eliseo Squires Plan 23 hour observation. Telemetry.  #1 syncope #2 hypertension #3 hypoglycemia #4 minor closed head trauma 5 abrasion to knee     Orlie Dakin, MD 03/21/14 1625  Addendum at 4:25 PM I was notified the nurse and his repeat CBG after eating was 34. Patient remains alert Glasgow Coma Score 15. D50 1 amp ordered intravenously  Orlie Dakin, MD 03/21/14 1635

## 2014-03-21 NOTE — Progress Notes (Signed)
Patient transferred from ED. VS stable. CBG up from 34 to 166. Family at bedside. Will continue to monitor.

## 2014-03-21 NOTE — ED Notes (Signed)
Pt from home, c/o fall out of chair. Per ems, Family stated positive LOC. AxO upon ems arrival. Pt initially hypotensive, 98/50. Pt received 200cc LR last bp 112/85. Pt c/o of neck pain

## 2014-03-21 NOTE — ED Notes (Signed)
Pt reports having bil CP at noon & reports taking 2 pumps of nitroglycerin spray

## 2014-03-21 NOTE — ED Notes (Signed)
Patient transported to CT 

## 2014-03-21 NOTE — ED Notes (Signed)
[name] Leeds, MD notified re: CBG

## 2014-03-21 NOTE — H&P (Addendum)
[facility] History and Physical  [name] [name] ZOX:[id number] DOB: July 09, 1933 DOA: 03/21/2014  Referring physician: er PCP: [name], MD   Chief Complaint: syncope  HPI: [name] [name] is a 80 y.o. male  Who was recently discharged from the hospital for a syncopal episode.    Cardiac enzymes neg, CT of the chest with contrast neg for pulmonary embolism, Not orthostatic  -Patient had a 2-D echocardiogram on 12/02/2013 which showed systolic function was normal with EF of 81-19%, grade 1 diastolic dysfunction  -Patient also had a stress test in February of 2015 which showed small and fixed inferoapical defect on perfusion studies.  D/c diagnosis was possible vasovagal syncope He was also found to have a Left upper lobe perihilar mass with complete occlusion of the left upper lung bronchus  -CT findings concerning for primary bronchogenic malignancy in the setting of patient with history of non-Hodgkin's lymphoma.  -Pt is s/p bronch on 5/18 with results pos for squamous cell carcinoma  BP low 100s during that hospitalization  Today, he had an episode of chest pain, lasted 30 minutes, took 2 sprays of nitro and then 1 hour later, he felt weak and fell out of his chair.  Striking his head and left knee.  Family reports 1 min of LOC.  Initial BP was 98/50 and EMS gave IVF.    In the ER, his BP responded to NS.  Labs showed a low BP responded to IVF.  Chest pain resolved and no further syncope Glucose was low 60s but dropped to 30s    Review of Systems:  All systems reviewed, negative unless stated above    Past Medical History  Diagnosis Date  . Peripheral arterial disease   . Diabetes mellitus without complication   . CAD (coronary artery disease)   . Hyperlipidemia   . Hypertension   . Non Hodgkin's lymphoma   . Peripheral neuropathy   . COPD (chronic obstructive pulmonary disease)   . History of lower GI bleeding   . Substance abuse    Past Surgical History    Procedure Laterality Date  . Fracture surgery  2009    Left ankle  . Pr vein bypass graft,aorto-fem-pop  2005    Right common femoral to BK popliteal BPG  . Pr vein bypass graft,aorto-fem-pop  01-27-05    Revision of Right fem-pop BPG  . Cardiac catheterization  1990's  . Video bronchoscopy Bilateral 03/14/2014    Procedure: VIDEO BRONCHOSCOPY WITH FLUORO;  Surgeon: Rigoberto Noel, MD;  Location: [place];  Service: Cardiopulmonary;  Laterality: Bilateral;   Social History:  reports that he quit smoking about 10 years ago. His smoking use included Cigarettes. He smoked 0.00 packs per day. He has never used smokeless tobacco. He reports that he does not drink alcohol or use illicit drugs.  No Known Allergies  Family History  Problem Relation Age of Onset  . Cancer Mother     Colon  . Heart attack Mother   . Stroke Father   . Hyperlipidemia Father   . Hyperlipidemia Sister   . Hyperlipidemia Brother   . Hyperlipidemia Daughter   . Hypertension Son      Prior to Admission medications   Medication Sig Start Date End Date Taking? Authorizing Provider  Acetaminophen (TYLENOL ARTHRITIS PAIN PO) Take 650 mg by mouth 2 (two) times daily.    Yes Historical Provider, MD  aspirin 81 MG tablet Take 160 mg by mouth 2 (two) times daily.  Yes Historical Provider, MD  CINNAMON PO Take 1,000 mg by mouth 2 (two) times daily.   Yes Historical Provider, MD  DULoxetine (CYMBALTA) 30 MG capsule Take 30 mg by mouth daily. 04/27/13  Yes Historical Provider, MD  fenofibrate micronized (LOFIBRA) 200 MG capsule Take 200 mg by mouth daily before breakfast.   Yes Historical Provider, MD  finasteride (PROSCAR) 5 MG tablet Take 5 mg by mouth daily.   Yes Historical Provider, MD  gabapentin (NEURONTIN) 600 MG tablet Take 600 mg by mouth 3 (three) times daily.    Yes Historical Provider, MD  glucosamine-chondroitin 500-400 MG tablet Take 1 tablet by mouth 2 (two) times daily.   Yes Historical Provider, MD   HUMALOG MIX 75/25 KWIKPEN (75-25) 100 UNIT/ML SUPN Inject 18-30 Units into the skin 2 (two) times daily. 30 units in the morning and 18 in the evening 04/27/13  Yes Historical Provider, MD  Insulin Glulisine (APIDRA SOLOSTAR) 100 UNIT/ML Solostar Pen Inject 3-7 Units into the skin See admin instructions. Prn over 250   Yes Historical Provider, MD  isosorbide mononitrate (IMDUR) 60 MG 24 hr tablet Take 1 tablet (60 mg total) by mouth daily. 01/06/14  Yes Mihai Croitoru, MD  lisinopril (PRINIVIL,ZESTRIL) 20 MG tablet Take 10 mg by mouth daily.   Yes Historical Provider, MD  metFORMIN (GLUCOPHAGE) 1000 MG tablet Take 1,000 mg by mouth 2 (two) times daily with a meal.   Yes Historical Provider, MD  metoprolol tartrate (LOPRESSOR) 25 MG tablet Take 1 tablet (25 mg total) by mouth 2 (two) times daily. 11/24/13  Yes Mihai Croitoru, MD  nitroGLYCERIN (NITROLINGUAL) 0.4 MG/SPRAY spray Place 1 spray under the tongue every 5 (five) minutes x 3 doses as needed for chest pain. 11/24/13  Yes Mihai Croitoru, MD  pioglitazone (ACTOS) 30 MG tablet Take 30 mg by mouth daily.   Yes Historical Provider, MD  predniSONE (DELTASONE) 20 MG tablet Take 2 tablets (40 mg total) by mouth daily with breakfast. 03/16/14  Yes Donne Hazel, MD  rosuvastatin (CRESTOR) 10 MG tablet Take 10 mg by mouth at bedtime.    Yes Historical Provider, MD  Tamsulosin HCl (FLOMAX) 0.4 MG CAPS Take 0.4 mg by mouth daily.   Yes Historical Provider, MD   Physical Exam: Filed Vitals:   03/21/14 1630  BP: 100/43  Pulse: 81  Temp:   Resp: 19    BP 100/43  Pulse 81  Temp(Src) 97.8 F (36.6 C) (Oral)  Resp 19  Ht 5' 11.5" (1.816 m)  Wt 94.802 kg (209 lb)  BMI 28.75 kg/m2  SpO2 95%  General:  Appears calm and comfortable Eyes: PERRL, normal lids, irises & conjunctiva, left eye hematoma ENT: grossly normal hearing, lips & tongue Neck: no LAD, masses or thyromegaly Cardiovascular: RRR, no m/r/g. No LE edema. Respiratory: CTA bilaterally,  no w/r/r. Normal respiratory effort. Abdomen: soft, ntnd Skin: few scrapes Musculoskeletal: grossly normal tone BUE/BLE Psychiatric: grossly normal mood and affect, speech fluent and appropriate Neurologic: grossly non-focal.          Labs on Admission:  Basic Metabolic Panel:  Recent Labs Lab 03/21/14 1456  NA 137  K 3.9  CL 99  CO2 25  GLUCOSE 52*  BUN 14  CREATININE 1.07  CALCIUM 10.1   Liver Function Tests: No results found for this basename: AST, ALT, ALKPHOS, BILITOT, PROT, ALBUMIN,  in the last 168 hours No results found for this basename: LIPASE, AMYLASE,  in the last 168 hours No results  found for this basename: AMMONIA,  in the last 168 hours CBC:  Recent Labs Lab 03/21/14 1456  WBC 10.4  NEUTROABS 6.2  HGB 12.0*  HCT 36.4*  MCV 85.0  PLT 258   Cardiac Enzymes:  Recent Labs Lab 03/15/14 0530 03/15/14 1140 03/21/14 1456  TROPONINI <0.30 <0.30 <0.30    BNP (last 3 results) No results found for this basename: PROBNP,  in the last 8760 hours CBG:  Recent Labs Lab 03/14/14 2125 03/15/14 0623 03/15/14 1132 03/15/14 1623 03/21/14 1628  GLUCAP 232* 249* 204* 284* 34*    Radiological Exams on Admission: Ct Head Wo Contrast  03/21/2014   CLINICAL DATA:  Fall, scalp hematoma  EXAM: CT HEAD WITHOUT CONTRAST  TECHNIQUE: Contiguous axial images were obtained from the base of the skull through the vertex without intravenous contrast.  COMPARISON:  CT PET scan 02/20/2009  FINDINGS: No skull fracture is noted. Paranasal sinuses and mastoid air cells are unremarkable. Mild cerebral atrophy again noted. Atherosclerotic calcifications of carotid siphon. No intracranial hemorrhage, mass effect or midline shift. No acute cortical infarction. No mass lesion is noted on this unenhanced scan.  IMPRESSION: No acute intracranial abnormality.  Mild cerebral atrophy.   Electronically Signed   By: Lahoma Crocker M.D.   On: 03/21/2014 15:41   Dg Chest Portable 1  View  03/21/2014   CLINICAL DATA:  80 year old male with shortness of breath following fall.  EXAM: PORTABLE CHEST - 1 VIEW  COMPARISON:  03/14/2014 chest radiograph and CT.  FINDINGS: A left perihilar mass is again noted and relatively unchanged.  New haziness overlying the left hemi thorax may represent a layering effusion.  There is no evidence of pneumothorax or right pleural effusion.  The right lung is clear.  No acute bony abnormalities are identified.  IMPRESSION: New haziness overlying the left hemi thorax which could represent a layering pleural effusion.  Unchanged right perihilar mass.   Electronically Signed   By: Hassan Rowan M.D.   On: 03/21/2014 15:18    EKG: Independently reviewed. Sinus   Assessment/Plan Active Problems:   DIABETES MELLITUS, TYPE II, ON INSULIN   LYMPHOMA, HX OF   Aortic stenosis   Syncope and collapse   Lung mass   Syncope   Hypoglycemic reaction  Syncope and collapse- appears to be related to  Hypotension vs hypoglycemia- hold all home meds, BP during last stay was low.  Patient also Took nitro spray, no seizure like activity - patient is prob not requiring as much mediations as he has had a 40lb weight loss recently  Chest pain- cycle CE   Hypoglycemia-  Holding meds  Lung mass- follows with Dr. Benay Spice  AS- recent echo, hold on repeating today  DM- SSI- hold home meds as hypoglycemic   Code Status: full Family Communication: patient and daughters Disposition Plan: abs  Time spent: 75 min  [facility] Pager [phone]  **Disclaimer: This note may have been dictated with voice recognition software. Similar sounding words can inadvertently be transcribed and this note may contain transcription errors which may not have been corrected upon publication of note.**

## 2014-03-22 DIAGNOSIS — R222 Localized swelling, mass and lump, trunk: Secondary | ICD-10-CM

## 2014-03-22 LAB — BASIC METABOLIC PANEL
BUN: 11 mg/dL (ref 6–23)
CALCIUM: 9.9 mg/dL (ref 8.4–10.5)
CO2: 27 meq/L (ref 19–32)
CREATININE: 0.98 mg/dL (ref 0.50–1.35)
Chloride: 100 mEq/L (ref 96–112)
GFR, EST AFRICAN AMERICAN: 89 mL/min — AB (ref >90)
GFR, EST NON AFRICAN AMERICAN: 77 mL/min — AB (ref >90)
Glucose, Bld: 153 mg/dL — ABNORMAL HIGH (ref 70–99)
Potassium: 5 mEq/L (ref 3.7–5.3)
SODIUM: 136 meq/L — AB (ref 137–147)

## 2014-03-22 LAB — GLUCOSE, CAPILLARY
GLUCOSE-CAPILLARY: 174 mg/dL — AB (ref 70–99)
GLUCOSE-CAPILLARY: 181 mg/dL — AB (ref 70–99)
GLUCOSE-CAPILLARY: 216 mg/dL — AB (ref 70–99)
GLUCOSE-CAPILLARY: 180 mg/dL — AB (ref 70–99)

## 2014-03-22 LAB — CBC
HCT: 34.6 % — ABNORMAL LOW (ref 39.0–52.0)
Hemoglobin: 11.1 g/dL — ABNORMAL LOW (ref 13.0–17.0)
MCH: 27.6 pg (ref 26.0–34.0)
MCHC: 32.1 g/dL (ref 30.0–36.0)
MCV: 86.1 fL (ref 78.0–100.0)
Platelets: 215 10*3/uL (ref 150–400)
RBC: 4.02 MIL/uL — AB (ref 4.22–5.81)
RDW: 15.2 % (ref 11.5–15.5)
WBC: 6.5 10*3/uL (ref 4.0–10.5)

## 2014-03-22 LAB — TROPONIN I: Troponin I: <0.3 ng/mL (ref <0.30)

## 2014-03-22 NOTE — Progress Notes (Signed)
PROGRESS NOTE  [name] [name] UDJ:[id number] DOB: 1933-07-13 DOA: 03/21/2014 PCP: [name], MD  Assessment/Plan: Syncope and collapse- appears to be related to Hypotension vs hypoglycemia- hold all home meds, BP during last stay was low. Patient also Took nitro spray, no seizure like activity  - patient is prob not requiring as much mediations as he has had a 40lb weight loss recently   Chest pain- cycle CE - negative  Hypoglycemia- Holding meds   Lung mass- follows with Dr. Benay Spice   AS- recent echo, hold on repeating today   DM- SSI- hold home meds as hypoglycemic- will need insulin resumed at a lower dose upon d/c   Code Status: full Family Communication: patient Disposition Plan: home in AM   Consultants:    Procedures:     HPI/Subjective: Up with PT, no dizziness, no syncope  Objective: Filed Vitals:   03/22/14 0631  BP: 107/60  Pulse: 90  Temp: 97.6 F (36.4 C)  Resp: 18    Intake/Output Summary (Last 24 hours) at 03/22/14 1123 Last data filed at 03/22/14 1043  Gross per 24 hour  Intake    720 ml  Output    400 ml  Net    320 ml   Filed Weights   03/21/14 1408 03/22/14 0053  Weight: 94.802 kg (209 lb) 95.2 kg (209 lb 14.1 oz)    Exam:   General:  A+Ox3, NAd  Cardiovascular: rrr  Respiratory: clear anterior  Abdomen: +Bs, soft  Musculoskeletal: moves all 4 ext   Data Reviewed: Basic Metabolic Panel:  Recent Labs Lab 03/21/14 1456 03/22/14 0447  NA 137 136*  K 3.9 5.0  CL 99 100  CO2 25 27  GLUCOSE 52* 153*  BUN 14 11  CREATININE 1.07 0.98  CALCIUM 10.1 9.9   Liver Function Tests: No results found for this basename: AST, ALT, ALKPHOS, BILITOT, PROT, ALBUMIN,  in the last 168 hours No results found for this basename: LIPASE, AMYLASE,  in the last 168 hours No results found for this basename: AMMONIA,  in the last 168 hours CBC:  Recent Labs Lab 03/21/14 1456 03/22/14 0447  WBC 10.4 6.5  NEUTROABS 6.2  --   HGB  12.0* 11.1*  HCT 36.4* 34.6*  MCV 85.0 86.1  PLT 258 215   Cardiac Enzymes:  Recent Labs Lab 03/15/14 1140 03/21/14 1456 03/21/14 1835 03/21/14 2352 03/22/14 0447  TROPONINI <0.30 <0.30 <0.30 <0.30 <0.30   BNP (last 3 results) No results found for this basename: PROBNP,  in the last 8760 hours CBG:  Recent Labs Lab 03/15/14 1623 03/21/14 1628 03/21/14 1734 03/21/14 2119 03/22/14 0611  GLUCAP 284* 34* 166* 233* 174*    Recent Results (from the past 240 hour(s))  AFB CULTURE WITH SMEAR     Status: None   Collection Time    03/14/14  3:21 PM      Result Value Ref Range Status   Specimen Description BRONCHIAL ALVEOLAR LAVAGE   Final   Special Requests NONE   Final   Acid Fast Smear     Final   Value: NO ACID FAST BACILLI SEEN     Performed at [facility]   Culture     Final   Value: CULTURE WILL BE EXAMINED FOR 6 WEEKS BEFORE ISSUING A FINAL REPORT     Performed at [facility]   Report Status PENDING   Incomplete  CULTURE, BAL-QUANTITATIVE     Status: None   Collection Time  03/14/14  3:21 PM      Result Value Ref Range Status   Specimen Description BRONCHIAL ALVEOLAR LAVAGE   Final   Special Requests NONE   Final   Gram Stain     Final   Value: RARE WBC PRESENT, PREDOMINANTLY PMN     NO SQUAMOUS EPITHELIAL CELLS SEEN     NO ORGANISMS SEEN     Performed at [facility]     Final   Value: 60,000 COLONIES/ML     Performed at [facility]   Culture     Final   Value: Non-Pathogenic Oropharyngeal-type Flora Isolated.     Performed at [facility]   Report Status 03/17/2014 FINAL   Final  FUNGUS CULTURE W SMEAR     Status: None   Collection Time    03/14/14  3:21 PM      Result Value Ref Range Status   Specimen Description BRONCHIAL ALVEOLAR LAVAGE   Final   Special Requests NONE   Final   Fungal Smear     Final   Value: NO YEAST OR FUNGAL ELEMENTS SEEN     Performed at [facility]    Culture     Final   Value: CULTURE IN PROGRESS FOR FOUR WEEKS     Performed at [facility]   Report Status PENDING   Incomplete     Studies: Ct Head Wo Contrast  03/21/2014   CLINICAL DATA:  Fall, scalp hematoma  EXAM: CT HEAD WITHOUT CONTRAST  TECHNIQUE: Contiguous axial images were obtained from the base of the skull through the vertex without intravenous contrast.  COMPARISON:  CT PET scan 02/20/2009  FINDINGS: No skull fracture is noted. Paranasal sinuses and mastoid air cells are unremarkable. Mild cerebral atrophy again noted. Atherosclerotic calcifications of carotid siphon. No intracranial hemorrhage, mass effect or midline shift. No acute cortical infarction. No mass lesion is noted on this unenhanced scan.  IMPRESSION: No acute intracranial abnormality.  Mild cerebral atrophy.   Electronically Signed   By: Lahoma Crocker M.D.   On: 03/21/2014 15:41   Dg Chest Portable 1 View  03/21/2014   CLINICAL DATA:  80 year old male with shortness of breath following fall.  EXAM: PORTABLE CHEST - 1 VIEW  COMPARISON:  03/14/2014 chest radiograph and CT.  FINDINGS: A left perihilar mass is again noted and relatively unchanged.  New haziness overlying the left hemi thorax may represent a layering effusion.  There is no evidence of pneumothorax or right pleural effusion.  The right lung is clear.  No acute bony abnormalities are identified.  IMPRESSION: New haziness overlying the left hemi thorax which could represent a layering pleural effusion.  Unchanged right perihilar mass.   Electronically Signed   By: Hassan Rowan M.D.   On: 03/21/2014 15:18    Scheduled Meds: . aspirin EC  162 mg Oral BID  . atorvastatin  20 mg Oral q1800  . DULoxetine  30 mg Oral Daily  . finasteride  5 mg Oral Daily  . gabapentin  600 mg Oral TID  . insulin aspart  0-15 Units Subcutaneous TID WC  . insulin aspart  0-5 Units Subcutaneous QHS  . metFORMIN  1,000 mg Oral BID WC  . sodium chloride  3 mL Intravenous Q12H  .  sodium chloride  3 mL Intravenous Q12H  . tamsulosin  0.4 mg Oral Daily   Continuous Infusions:  Antibiotics Given (last 72 hours)  None      Active Problems:   DIABETES MELLITUS, TYPE II, ON INSULIN   LYMPHOMA, HX OF   Aortic stenosis   Syncope and collapse   Lung mass   Syncope   Hypoglycemic reaction    Time spent: [facility] Pager [phone]. If 7PM-7AM, please contact night-coverage at [email or web address], password [facility] 03/22/2014, 11:23 AM  LOS: 1 day

## 2014-03-22 NOTE — Evaluation (Signed)
Physical Therapy One Time Evaluation Patient Details Name: [name] [name] MRN: [id number] DOB: 1933/09/23 Today's Date: 03/22/2014   History of Present Illness  Pt admitted 5/25 after syncopal episode at home with abrasion to knee and striking head.  CT head negative for acute abnormality  Clinical Impression  Patient evaluated by Physical Therapy with no further acute PT needs identified. All education has been completed and the patient has no further questions. Pt reports falls and recent admission due to syncope episodes, denies LOB or mechanical falls.  Pt's orthostatics assessed (see below).  See below for any follow-up Physical Therapy or equipment needs. PT is signing off. Thank you for this referral.     Follow Up Recommendations No PT follow up    Equipment Recommendations  None recommended by PT    Recommendations for Other Services       Precautions / Restrictions Precautions Precautions: Fall Precaution Comments: syncope episodes      Mobility  Bed Mobility Overal bed mobility: Independent                Transfers Overall transfer level: Modified independent                  Ambulation/Gait Ambulation/Gait assistance: Modified independent (Device/Increase time) Ambulation Distance (Feet): 400 Feet Assistive device: None Gait Pattern/deviations: WFL(Within Functional Limits)     General Gait Details: pt pushed IV pole however not required for support, pt denies dizziness with all mobility  Stairs            Wheelchair Mobility    Modified Rankin (Stroke Patients Only)       Balance Overall balance assessment: No apparent balance deficits (not formally assessed);History of Falls (states falls due to syncope episodes only)                                           Pertinent Vitals/Pain Orthostatic BPs (mmHg)  Supine 115/55   Sitting 100/54     Standing 92/55  Standing after 1 min marching 107/57   Pt denies  dizziness throughout session.   Post-gait: SpO2 95% and HR 109 bpm    Home Living Family/patient expects to be discharged to:: Private residence Living Arrangements: Alone   Type of Home: Mobile home Home Access: Ramped entrance     Home Layout: One level Home Equipment: Environmental consultant - 2 wheels;Cane - single point      Prior Function Level of Independence: Independent               Hand Dominance        Extremity/Trunk Assessment   Upper Extremity Assessment: Overall WFL for tasks assessed           Lower Extremity Assessment: Overall WFL for tasks assessed         Communication   Communication: No difficulties  Cognition Arousal/Alertness: Awake/alert Behavior During Therapy: WFL for tasks assessed/performed Overall Cognitive Status: Within Functional Limits for tasks assessed                      General Comments      Exercises        Assessment/Plan    PT Assessment Patent does not need any further PT services  PT Diagnosis     PT Problem List    PT Treatment Interventions     PT Goals (  Current goals can be found in the Care Plan section) Acute Rehab PT Goals PT Goal Formulation: No goals set, d/c therapy    Frequency     Barriers to discharge        Co-evaluation               End of Session   Activity Tolerance: Patient tolerated treatment well Patient left: in bed;with call bell/phone within reach;with bed alarm set      Functional Assessment Tool Used: clinical judgement Functional Limitation: Mobility: Walking and moving around Mobility: Walking and Moving Around Current Status ([id number]): 0 percent impaired, limited or restricted Mobility: Walking and Moving Around Goal Status [phone]): 0 percent impaired, limited or restricted Mobility: Walking and Moving Around Discharge Status [phone]): 0 percent impaired, limited or restricted    Time: 0223-3612 PT Time Calculation (min): 19 min   Charges:   PT  Evaluation $Initial PT Evaluation Tier I: 1 Procedure PT Treatments $Gait Training: 8-22 mins   PT G Codes:   Functional Assessment Tool Used: clinical judgement Functional Limitation: Mobility: Walking and moving around    [employer] 03/22/2014, 9:53 AM Carmelia Bake, PT, DPT 03/22/2014 Pager: [phone]

## 2014-03-22 NOTE — Progress Notes (Signed)
Inpatient Diabetes Program Recommendations  AACE/ADA: New Consensus Statement on Inpatient Glycemic Control (2013)  Target Ranges:  Prepandial:   less than 140 mg/dL      Peak postprandial:   less than 180 mg/dL (1-2 hours)      Critically ill patients:  140 - 180 mg/dL   Inpatient Diabetes Program Recommendations Oral Agents: consider dc Actos since patient is using insulin at home Thank you  Raoul Pitch BSN, RN,CDE Inpatient Diabetes Coordinator [phone] (team pager)

## 2014-03-22 NOTE — Progress Notes (Signed)
UR completed 

## 2014-03-22 NOTE — Progress Notes (Signed)
Nutrition Brief Note  Patient identified on the Malnutrition Screening Tool (MST) Report for recent weight lost without trying.  Patient reports he was trying to eat healthier and consume smaller portions.  His appetite as been good.  Wt Readings from Last 15 Encounters:  03/22/14 209 lb 14.1 oz (95.2 kg)  03/15/14 209 lb 7 oz (95 kg)  03/15/14 209 lb 7 oz (95 kg)  02/08/14 216 lb 11.2 oz (98.294 kg)  01/06/14 220 lb 14.4 oz (100.2 kg)  12/08/13 222 lb 12.8 oz (101.061 kg)  11/30/13 221 lb (100.245 kg)  11/23/13 221 lb 6.4 oz (100.426 kg)  10/11/13 222 lb (100.699 kg)  05/11/13 225 lb 9.6 oz (102.331 kg)  10/13/12 241 lb (109.317 kg)  08/11/12 230 lb 6.4 oz (104.509 kg)  11/07/11 239 lb 1.6 oz (108.455 kg)  08/15/09 240 lb 6.1 oz (109.036 kg)  07/20/09 241 lb (109.317 kg)    Body mass index is 28.87 kg/(m^2). Patient meets criteria for Overweight based on current BMI.   Current diet order is Carbohydrate Modified, patient is consuming approximately 100% of meals at this time. Labs and medications reviewed.   No nutrition interventions warranted at this time. If nutrition issues arise, please consult RD.   Arthur Holms, RD, LDN Pager #: [phone] After-Hours Pager #: [phone]

## 2014-03-23 LAB — BASIC METABOLIC PANEL
BUN: 10 mg/dL (ref 6–23)
CO2: 24 mEq/L (ref 19–32)
CREATININE: 0.86 mg/dL (ref 0.50–1.35)
Calcium: 10 mg/dL (ref 8.4–10.5)
Chloride: 99 mEq/L (ref 96–112)
GFR calc Af Amer: >90 mL/min (ref >90)
GFR calc non Af Amer: 81 mL/min — ABNORMAL LOW (ref >90)
Glucose, Bld: 206 mg/dL — ABNORMAL HIGH (ref 70–99)
POTASSIUM: 4.5 meq/L (ref 3.7–5.3)
SODIUM: 134 meq/L — AB (ref 137–147)

## 2014-03-23 LAB — CBC
HCT: 36.2 % — ABNORMAL LOW (ref 39.0–52.0)
Hemoglobin: 11.7 g/dL — ABNORMAL LOW (ref 13.0–17.0)
MCH: 27.8 pg (ref 26.0–34.0)
MCHC: 32.3 g/dL (ref 30.0–36.0)
MCV: 86 fL (ref 78.0–100.0)
Platelets: 217 10*3/uL (ref 150–400)
RBC: 4.21 MIL/uL — ABNORMAL LOW (ref 4.22–5.81)
RDW: 15.2 % (ref 11.5–15.5)
WBC: 7.5 10*3/uL (ref 4.0–10.5)

## 2014-03-23 LAB — GLUCOSE, CAPILLARY
GLUCOSE-CAPILLARY: 196 mg/dL — AB (ref 70–99)
Glucose-Capillary: 199 mg/dL — ABNORMAL HIGH (ref 70–99)

## 2014-03-23 MED ORDER — METOPROLOL TARTRATE 25 MG PO TABS: 12.5000 mg | ORAL_TABLET | Freq: Two times a day (BID) | ORAL | Status: DC

## 2014-03-23 MED ORDER — METOPROLOL TARTRATE 12.5 MG HALF TABLET
12.5000 mg | ORAL_TABLET | Freq: Two times a day (BID) | ORAL | Status: DC
  Filled 2014-03-23 (×3): qty 1

## 2014-03-23 MED ORDER — INSULIN LISPRO PROT & LISPRO (75-25 MIX) 100 UNIT/ML KWIKPEN: 10.0000 [IU] | PEN_INJECTOR | Freq: Two times a day (BID) | SUBCUTANEOUS | Status: DC

## 2014-03-23 NOTE — Discharge Summary (Signed)
Physician Discharge Summary  [name] [name] BJS:[id number] DOB: [birth year] DOA: 03/21/2014  PCP: [name], MD  Admit date: 03/21/2014 Discharge date: 03/23/2014  Time spent: 35 minutes  Recommendations for Outpatient Follow-up:  1. Monitor blood sugars 2. Requiring less medications due to 40 lb weight loss  Discharge Diagnoses:  Active Problems:   DIABETES MELLITUS, TYPE II, ON INSULIN   LYMPHOMA, HX OF   Aortic stenosis   Syncope and collapse   Lung mass   Syncope   Hypoglycemic reaction   Discharge Condition: improved  Diet recommendation: cardiac/diabetic  Filed Weights   03/21/14 1408 03/22/14 0053 03/23/14 0644  Weight: 94.802 kg (209 lb) 95.2 kg (209 lb 14.1 oz) 92.7 kg (204 lb 5.9 oz)    History of present illness:  [name] [name] is a 80 y.o. male  Who was recently discharged from the hospital for a syncopal episode.  Cardiac enzymes neg, CT of the chest with contrast neg for pulmonary embolism, Not orthostatic  -Patient had a 2-D echocardiogram on 12/02/2013 which showed systolic function was normal with EF of 60-73%, grade 1 diastolic dysfunction  -Patient also had a stress test in February of 2015 which showed small and fixed inferoapical defect on perfusion studies.  D/c diagnosis was possible vasovagal syncope  He was also found to have a Left upper lobe perihilar mass with complete occlusion of the left upper lung bronchus  -CT findings concerning for primary bronchogenic malignancy in the setting of patient with history of non-Hodgkin's lymphoma.  -Pt is s/p bronch on 5/18 with results pos for squamous cell carcinoma  BP low 100s during that hospitalization  Today, he had an episode of chest pain, lasted 30 minutes, took 2 sprays of nitro and then 1 hour later, he felt weak and fell out of his chair. Striking his head and left knee. Family reports 1 min of LOC. Initial BP was 98/50 and EMS gave IVF.  In the ER, his BP responded to NS. Labs showed a low BP  responded to IVF. Chest pain resolved and no further syncope  Glucose was low 60s but dropped to 30s      Hospital Course:  Syncope and collapse- appears to be related to Hypotension vs hypoglycemia- hold all home meds, BP during last stay was low. Patient also Took nitro spray, no seizure like activity  - patient is prob not requiring as much mediations as he has had a 40lb weight loss recently -will resume at d/c since BP better and patient is tachy now  Chest pain- cycle CE - negative  Tele ok   Hypoglycemia- resume lower dose of home meds- titrate as needed   Lung mass- follows with Dr. Benay Spice   AS- recent echo, hold on repeating today   DM- SSI- hold home meds as hypoglycemic- will need insulin resumed at a lower dose upon d/c   Procedures:  none  Consultations:  none  Discharge Exam: Filed Vitals:   03/23/14 0644  BP: 122/71  Pulse: 84  Temp: 98.7 F (37.1 C)  Resp: 18    General: A+Ox3, NAD Cardiovascular: rrr Respiratory: clear  Discharge Instructions You were cared for by a hospitalist during your hospital stay. If you have any questions about your discharge medications or the care you received while you were in the hospital after you are discharged, you can call the unit and asked to speak with the hospitalist on call if the hospitalist that took care of you is not available. Once  you are discharged, your primary care physician will handle any further medical issues. Please note that NO REFILLS for any discharge medications will be authorized once you are discharged, as it is imperative that you return to your primary care physician (or establish a relationship with a primary care physician if you do not have one) for your aftercare needs so that they can reassess your need for medications and monitor your lab values.      Discharge Instructions   Diet - low sodium heart healthy    Complete by:  As directed      Diet Carb Modified    Complete by:  As  directed      Discharge instructions    Complete by:  As directed   Follow blood sugar- may need adjustments PRN No driving until seen by PCP     Increase activity slowly    Complete by:  As directed             Medication List    STOP taking these medications       isosorbide mononitrate 60 MG 24 hr tablet  Commonly known as:  IMDUR     lisinopril 20 MG tablet  Commonly known as:  PRINIVIL,ZESTRIL     pioglitazone 30 MG tablet  Commonly known as:  ACTOS     predniSONE 20 MG tablet  Commonly known as:  DELTASONE      TAKE these medications       APIDRA SOLOSTAR 100 UNIT/ML Solostar Pen  Generic drug:  Insulin Glulisine  Inject 3-7 Units into the skin See admin instructions. Prn over 250     aspirin 81 MG tablet  Take 160 mg by mouth 2 (two) times daily.     CINNAMON PO  Take 1,000 mg by mouth 2 (two) times daily.     DULoxetine 30 MG capsule  Commonly known as:  CYMBALTA  Take 30 mg by mouth daily.     fenofibrate micronized 200 MG capsule  Commonly known as:  LOFIBRA  Take 200 mg by mouth daily before breakfast.     finasteride 5 MG tablet  Commonly known as:  PROSCAR  Take 5 mg by mouth daily.     gabapentin 600 MG tablet  Commonly known as:  NEURONTIN  Take 600 mg by mouth 3 (three) times daily.     glucosamine-chondroitin 500-400 MG tablet  Take 1 tablet by mouth 2 (two) times daily.     Insulin Lispro Prot & Lispro (75-25) 100 UNIT/ML Kwikpen  Commonly known as:  HUMALOG MIX 75/25 KWIKPEN  Inject 10 Units into the skin 2 (two) times daily.     metFORMIN 1000 MG tablet  Commonly known as:  GLUCOPHAGE  Take 1,000 mg by mouth 2 (two) times daily with a meal.     metoprolol tartrate 25 MG tablet  Commonly known as:  LOPRESSOR  Take 0.5 tablets (12.5 mg total) by mouth 2 (two) times daily.     nitroGLYCERIN 0.4 MG/SPRAY spray  Commonly known as:  NITROLINGUAL  Place 1 spray under the tongue every 5 (five) minutes x 3 doses as needed for chest  pain.     rosuvastatin 10 MG tablet  Commonly known as:  CRESTOR  Take 10 mg by mouth at bedtime.     tamsulosin 0.4 MG Caps capsule  Commonly known as:  FLOMAX  Take 0.4 mg by mouth daily.     TYLENOL ARTHRITIS PAIN PO  Take 650 mg  by mouth 2 (two) times daily.       No Known Allergies    The results of significant diagnostics from this hospitalization (including imaging, microbiology, ancillary and laboratory) are listed below for reference.    Significant Diagnostic Studies: Dg Chest 2 View  03/14/2014   CLINICAL DATA:  LOSS OF CONSCIOUSNESS SHORTNESS OF BREATH  New Renal having  EXAM: CHEST  2 VIEW  COMPARISON:  NM PET IMAGE RESTAG (PS) SKULL BASE TO THIGH dated 02/20/2009; DG CHEST 2 VIEW dated 12/19/2007  FINDINGS: Cardiac silhouette within the upper limits normal. Calcified mitral valve annulus. Atherosclerotic calcification in the aorta. A 6 cm consolidative masslike left perihilar density with spiculation. There is volume loss in the left hemi thorax. There is flattening of the right hemidiaphragm and blunting of the right costophrenic angle. The lungs otherwise clear. Degenerative changes within the shoulders.  IMPRESSION: Consolidative masslike density in the left perihilar region concerning for soft tissue mass until proven otherwise. Further evaluation with contrast chest CT recommended.  COPD   Electronically Signed   By: Margaree Mackintosh M.D.   On: 03/14/2014 01:18   Ct Head Wo Contrast  03/21/2014   CLINICAL DATA:  Fall, scalp hematoma  EXAM: CT HEAD WITHOUT CONTRAST  TECHNIQUE: Contiguous axial images were obtained from the base of the skull through the vertex without intravenous contrast.  COMPARISON:  CT PET scan 02/20/2009  FINDINGS: No skull fracture is noted. Paranasal sinuses and mastoid air cells are unremarkable. Mild cerebral atrophy again noted. Atherosclerotic calcifications of carotid siphon. No intracranial hemorrhage, mass effect or midline shift. No acute  cortical infarction. No mass lesion is noted on this unenhanced scan.  IMPRESSION: No acute intracranial abnormality.  Mild cerebral atrophy.   Electronically Signed   By: Lahoma Crocker M.D.   On: 03/21/2014 15:41   Ct Angio Chest Pe W/cm &/or Wo Cm  03/14/2014   CLINICAL DATA:  Weakness and shortness of breath for the past 3 weeks. Abnormal chest x-ray.  EXAM: CT ANGIOGRAPHY CHEST WITH CONTRAST  TECHNIQUE: Multidetector CT imaging of the chest was performed using the standard protocol during bolus administration of intravenous contrast. Multiplanar CT image reconstructions and MIPs were obtained to evaluate the vascular anatomy.  CONTRAST:  67mL OMNIPAQUE IOHEXOL 350 MG/ML SOLN  COMPARISON:  PET-CT 02/20/2009.  FINDINGS: Mediastinum: There are no filling defects within the pulmonary arterial tree to suggest underlying pulmonary embolism. There is, however, severe narrowing of several pulmonary artery branches to the left upper lobe, related to a presumed perihilar mass in a left upper lobe as well as left hilar lymphadenopathy. This hilar discretion this hilar lymphadenopathy is difficult to discretely measure on today's examination. No other definite mediastinal or right hilar adenopathy is noted. Heart size is normal. There is no significant pericardial fluid, thickening or pericardial calcification. There is atherosclerosis of the thoracic aorta, the great vessels of the mediastinum and the coronary arteries, including calcified atherosclerotic plaque in the left main, left anterior descending, left circumflex and right coronary arteries. Thickening and calcification of the aortic valve. Severe mitral annular calcifications.  Lungs/Pleura: Complete atelectasis of the left upper lobe. In the perihilar aspect of the left upper lobe the tissue exerts mass effect upon the adjacent bronchovascular structures, with marked narrowing of multiple pulmonary artery branches, and complete obliteration of the left upper lobe  bronchus best demonstrated on image 50 of series 406. Left lower lobe is reasonably well aerated, although there is some peripheral ground-glass attenuation and  subpleural reticulation in the inferior aspect of the left lower lobe.  Upper Abdomen: Calcified gallstone lying dependently in the gallbladder.  Musculoskeletal: There are no aggressive appearing lytic or blastic lesions noted in the visualized portions of the skeleton.  Review of the MIP images confirms the above findings.  IMPRESSION: 1. No evidence of pulmonary embolism. 2. However, there is an apparent left upper lobe perihilar mass with complete occlusion of the left upper lobe bronchus, complete atelectasis of the left upper lobe, and marked narrowing of several left-sided pulmonary artery branches. Findings are highly suspicious for primary bronchogenic malignancy, and correlation with PET-CT and/or bronchoscopy for diagnostic and staging purposes is strongly recommended in the near future. 3. Atherosclerosis, including left main and 3 vessel coronary artery disease. Assessment for potential risk factor modification, dietary therapy or pharmacologic therapy may be warranted, if clinically indicated. 4. There are calcifications of the aortic valve and mitral annulus. Echocardiographic correlation for evaluation of potential valvular dysfunction may be warranted if clinically indicated. 5. Additional incidental findings, as above. These results were called by telephone at the time of interpretation on 03/14/2014 at 3:13 AM to Dr. Elyn Peers, who verbally acknowledged these results.   Electronically Signed   By: Vinnie Langton M.D.   On: 03/14/2014 03:15   Dg Chest Portable 1 View  03/21/2014   CLINICAL DATA:  80 year old male with shortness of breath following fall.  EXAM: PORTABLE CHEST - 1 VIEW  COMPARISON:  03/14/2014 chest radiograph and CT.  FINDINGS: A left perihilar mass is again noted and relatively unchanged.  New haziness overlying the  left hemi thorax may represent a layering effusion.  There is no evidence of pneumothorax or right pleural effusion.  The right lung is clear.  No acute bony abnormalities are identified.  IMPRESSION: New haziness overlying the left hemi thorax which could represent a layering pleural effusion.  Unchanged right perihilar mass.   Electronically Signed   By: Hassan Rowan M.D.   On: 03/21/2014 15:18    Microbiology: Recent Results (from the past 240 hour(s))  AFB CULTURE WITH SMEAR     Status: None   Collection Time    03/14/14  3:21 PM      Result Value Ref Range Status   Specimen Description BRONCHIAL ALVEOLAR LAVAGE   Final   Special Requests NONE   Final   Acid Fast Smear     Final   Value: NO ACID FAST BACILLI SEEN     Performed at [facility]   Culture     Final   Value: CULTURE WILL BE EXAMINED FOR 6 WEEKS BEFORE ISSUING A FINAL REPORT     Performed at [facility]   Report Status PENDING   Incomplete  CULTURE, BAL-QUANTITATIVE     Status: None   Collection Time    03/14/14  3:21 PM      Result Value Ref Range Status   Specimen Description BRONCHIAL ALVEOLAR LAVAGE   Final   Special Requests NONE   Final   Gram Stain     Final   Value: RARE WBC PRESENT, PREDOMINANTLY PMN     NO SQUAMOUS EPITHELIAL CELLS SEEN     NO ORGANISMS SEEN     Performed at [facility]     Final   Value: 60,000 COLONIES/ML     Performed at [facility]   Culture     Final   Value: Non-Pathogenic Oropharyngeal-type Flora Isolated.  Performed at [facility]   Report Status 03/17/2014 FINAL   Final  FUNGUS CULTURE W SMEAR     Status: None   Collection Time    03/14/14  3:21 PM      Result Value Ref Range Status   Specimen Description BRONCHIAL ALVEOLAR LAVAGE   Final   Special Requests NONE   Final   Fungal Smear     Final   Value: NO YEAST OR FUNGAL ELEMENTS SEEN     Performed at [facility]   Culture     Final   Value: CULTURE IN  PROGRESS FOR FOUR WEEKS     Performed at [facility]   Report Status PENDING   Incomplete     Labs: Basic Metabolic Panel:  Recent Labs Lab 03/21/14 1456 03/22/14 0447 03/23/14 0340  NA 137 136* 134*  K 3.9 5.0 4.5  CL 99 100 99  CO2 25 27 24   GLUCOSE 52* 153* 206*  BUN 14 11 10   CREATININE 1.07 0.98 0.86  CALCIUM 10.1 9.9 10.0   Liver Function Tests: No results found for this basename: AST, ALT, ALKPHOS, BILITOT, PROT, ALBUMIN,  in the last 168 hours No results found for this basename: LIPASE, AMYLASE,  in the last 168 hours No results found for this basename: AMMONIA,  in the last 168 hours CBC:  Recent Labs Lab 03/21/14 1456 03/22/14 0447 03/23/14 0340  WBC 10.4 6.5 7.5  NEUTROABS 6.2  --   --   HGB 12.0* 11.1* 11.7*  HCT 36.4* 34.6* 36.2*  MCV 85.0 86.1 86.0  PLT 258 215 217   Cardiac Enzymes:  Recent Labs Lab 03/21/14 1456 03/21/14 1835 03/21/14 2352 03/22/14 0447  TROPONINI <0.30 <0.30 <0.30 <0.30   BNP: BNP (last 3 results) No results found for this basename: PROBNP,  in the last 8760 hours CBG:  Recent Labs Lab 03/22/14 0611 03/22/14 1121 03/22/14 1617 03/22/14 2059 03/23/14 0642  GLUCAP 174* 181* 216* 180* 199*       Signed:  Geradine Girt  [facility] 03/23/2014, 10:30 AM

## 2014-03-23 NOTE — Progress Notes (Signed)
Went over discharge instructions with patient. Given paper prescriptions. No additional questions or concerns related to discharge instructions. IV d/c'd patient taken off the cardiac monitor. Patient discharged home with family. Roxan Hockey

## 2014-03-28 ENCOUNTER — Ambulatory Visit (HOSPITAL_BASED_OUTPATIENT_CLINIC_OR_DEPARTMENT_OTHER): Payer: Medicare Other | Admitting: Oncology

## 2014-03-28 ENCOUNTER — Telehealth: Payer: Self-pay | Admitting: Oncology

## 2014-03-28 ENCOUNTER — Encounter: Payer: Self-pay | Admitting: Radiation Oncology

## 2014-03-28 VITALS — BP 105/49 | HR 63 | Temp 97.0°F | Resp 18 | Ht 71.5 in | Wt 214.0 lb

## 2014-03-28 DIAGNOSIS — N189 Chronic kidney disease, unspecified: Secondary | ICD-10-CM

## 2014-03-28 DIAGNOSIS — G589 Mononeuropathy, unspecified: Secondary | ICD-10-CM

## 2014-03-28 DIAGNOSIS — Z87898 Personal history of other specified conditions: Secondary | ICD-10-CM

## 2014-03-28 DIAGNOSIS — R0600 Dyspnea, unspecified: Secondary | ICD-10-CM

## 2014-03-28 DIAGNOSIS — C3410 Malignant neoplasm of upper lobe, unspecified bronchus or lung: Secondary | ICD-10-CM

## 2014-03-28 DIAGNOSIS — C3490 Malignant neoplasm of unspecified part of unspecified bronchus or lung: Secondary | ICD-10-CM

## 2014-03-28 DIAGNOSIS — E119 Type 2 diabetes mellitus without complications: Secondary | ICD-10-CM

## 2014-03-28 DIAGNOSIS — J449 Chronic obstructive pulmonary disease, unspecified: Secondary | ICD-10-CM

## 2014-03-28 DIAGNOSIS — R0689 Other abnormalities of breathing: Principal | ICD-10-CM

## 2014-03-28 MED ORDER — IPRATROPIUM-ALBUTEROL 20-100 MCG/ACT IN AERS: 1.0000 | INHALATION_SPRAY | Freq: Four times a day (QID) | RESPIRATORY_TRACT | Status: DC

## 2014-03-28 NOTE — Progress Notes (Signed)
Thoracic Location of Tumor / Histology: squamous cell carcinoma of the left lung - Left upper lobe perihilar mass   Patient presented on 03/14/14 to the ED with symptoms of: loss of conciousness, shortness of breath .  Biopsies revealed:   03/14/2014 Diagnosis BRONCHIAL WASHING LUL, (SPECIMEN 1 OF 2, COLLECTED ON 03/14/14): MALIGNANT CELLS PRESENT, CONSISTENT WITH SQUAMOUS CELL CARCINOMA.  Diagnosis BRONCHIAL BRUSHING LUL, (SPECIMEN 2 OF 2, COLLECTED ON 03/14/14): MALIGNANT CELLS PRESENT, CONSISTENT WITH SQUAMOUS CELL CARCINOMA.  Tobacco/Marijuana/Snuff/ETOH use: quit smoking in 2005, smoked 1 ppd for 50 years.  Does not drink.  Past/Anticipated interventions by cardiothoracic surgery, if any: unknown.  Past/Anticipated interventions by medical oncology, if any: Per Dr. Benay Spice, concurrent weekly Taxol/carboplatin chemotherapy.  Signs/Symptoms  Weight changes, if any: has lost 32 lbs over a year.  Respiratory complaints, if QQV:[id number] cough, wheezes, has shortness of breath with acitivity.  Hemoptysis, if any: no  Pain issues, if any:  no  SAFETY ISSUES:  Prior radiation? no  Pacemaker/ICD? no  Possible current pregnancy? no  Is the patient on methotrexate? no  Current Complaints / other details:  PET and MRI of brain scheduled for 04/12/14.  Per Dr. 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 He received Zinecard with the 3 cycles of CHOP/Rituxan. The last cycle of chemotherapy was given in May of 2000."  Patient has 2 children.  He is a retired Administrator.

## 2014-03-28 NOTE — Progress Notes (Signed)
[place] OFFICE PROGRESS NOTE   Diagnosis: Non-small cell lung cancer  INTERVAL HISTORY:   Mr. Allen returns as scheduled. He was diagnosed with squamous cell carcinoma of the left lung when he was hospitalized last month. He denies recurrent syncope events. He has exertional dyspnea and wheezing. He reports a good appetite, but he is losing weight.  Objective:  Vital signs in last 24 hours:  Blood pressure 105/49, pulse 63, temperature 97 F (36.1 C), temperature source Oral, resp. rate 18, height 5' 11.5" (1.816 m), weight 214 lb (97.07 kg), SpO2 100.00%.    HEENT: Neck without mass Lymphatics: No cervical, supraclavicular, axillary, or inguinal nodes Resp: Bilateral inspiratory and expiratory wheezing, no respiratory distress Cardio: Regular rate and rhythm GI: No hepatosplenomegaly Vascular: No leg edema   Lab Results:  Lab Results  Component Value Date   WBC 7.5 03/23/2014   HGB 11.7* 03/23/2014   HCT 36.2* 03/23/2014   MCV 86.0 03/23/2014   PLT 217 03/23/2014   NEUTROABS 6.2 03/21/2014      Medications: I have reviewed the patient's current medications.  Assessment/Plan: 1. 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 He received Zinecard with the 3 cycles of CHOP/Rituxan. The last cycle of chemotherapy was given in May of 2000  Restaging PET scan 02/20/2009 indicated marketed improvement in the hypermetabolic lymphadenopathy  CT of the abdomen and pelvis in October 2010 revealed no evidence for disease progression 2. Lung mass, suspicious for bronchogenic carcinoma, s/p FOB on 5/18, with a bronchial brushing and bronchial washing from the left upper lobe consistent with squamous cell  carcinoma.   Chest CT 03/14/2014 consistent with a left upper lobe mass, left hilar lymphadenopathy, left upper lobe atelectasis 3    Acute respiratory failure with COPD exacerbation secondary to #2 on nebs, steroids and doxycycline as per Pulmonary  4.   admission with syncope 03/14/2014 6.   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  7.   Abdominal aortic aneurysm noted on a CT in October 2010.  8.   Chronic renal failure  9.   COPD 10. History of coronary artery disease 11. Diabetes 12. Neuropathy    Disposition:  [name] [name] has been diagnosed with non-small cell lung cancer. The chest CT reveals tumor localized to the left upper chest and left hilum. He is scheduled to undergo a staging PET scan and brain MRI on 03/30/2014.  We will make a referral to consider surgery if there is no evidence of mediastinal or distant metastatic disease. He will most likely not be a candidate for a lung resection secondary to COPD and comorbid conditions.  If he is not a surgical candidate and there is no evidence of distant metastatic disease I recommend concurrent chemotherapy and radiation. He is scheduled to see Dr. [name] on 03/31/2014. I recommend concurrent weekly Taxol/carboplatin chemotherapy. I reviewed the potential toxicities associated with this chemotherapy regimen including the chance for alopecia, nausea, hematologic toxicity, and allergic reaction, and diarrhea. We discussed the neuropathy and bone pain associated with Taxol. He agrees to proceed. Mr. Muscat will attend a chemotherapy teaching class.  We scheduled a first cycle of chemotherapy for 04/11/2014 pending the staging PET scan result. I prescribed an inhaler to use as needed for the obstructive  lung symptoms.  Ladell Pier, MD  03/28/2014  4:08 PM

## 2014-03-28 NOTE — Telephone Encounter (Signed)
gv adn printed appts sched and avs for tp for June and July

## 2014-03-28 NOTE — Patient Instructions (Addendum)
A Combivent inhaler has been ordered for your wheezing/shortness of breath. Inhale one puff with the spacer provided every 6 hours as needed.  Albuterol; Ipratropium inhalation aerosol What is this medicine? ALBUTEROL; IPRATROPIUM (al BYOO ter ole; i pra TROE pee um) has two bronchodilators. It helps open up the airways in your lungs to make it easier to breathe.This medicine is used to treat chronic obstructive pulmonary disease (COPD). This medicine may be used for other purposes; ask your health care provider or pharmacist if you have questions. COMMON BRAND NAME(S): Combivent What should I tell my health care provider before I take this medicine? They need to know if you have any of the following conditions: -heart disease -high blood pressure -irregular heartbeat -an unusual or allergic reaction to albuterol, ipratropium, atropine, soya protein, soybeans or peanuts, other medicines, foods, dyes, or preservatives -pregnant or trying to get pregnant -breast-feeding How should I use this medicine? This medicine is for inhalation only. Follow the instructions on your prescription label. Do not use more often than directed. Make sure that you are using your inhaler correctly. Ask you doctor or health care provider if you have any questions. Talk to your pediatrician regarding the use of this medicine in children. Special care may be needed. Overdosage: If you think you have taken too much of this medicine contact a poison control center or emergency room at once. NOTE: This medicine is only for you. Do not share this medicine with others. What if I miss a dose? If you miss a dose, use it as soon as you can. If it is almost time for your next dose, use only that dose. Do not use double or extra doses. What may interact with this medicine? Do not take this medicine with any of the following medications: -MAOIs like Carbex, Eldepryl, Marplan, Nardil, and Parnate This medicine may also interact  with the following medications: -diuretics -medicines for depression, anxiety, or psychotic disturbances -medicines for irregular heartbeat -medicines for weight loss including some herbal products -methadone -pimozide -sertindole -some medicines for blood pressure or the heart This list may not describe all possible interactions. Give your health care provider a list of all the medicines, herbs, non-prescription drugs, or dietary supplements you use. Also tell them if you smoke, drink alcohol, or use illegal drugs. Some items may interact with your medicine. What should I watch for while using this medicine? Tell your doctor or health care professional if your symptoms do not improve. If your breathing gets worse while you are using this medicine, call your doctor right away. Do not stop using your medicine unless your doctor tells you to. Your mouth may get dry. Chewing sugarless gum or sucking hard candy, and drinking plenty of water may help. Contact your doctor if the problem does not go away or is severe. You may get dizzy or have blurred vision. Do not drive, use machinery, or do anything that needs mental alertness until you know how this medicine affects you. Do not stand or sit up quickly, especially if you are an older patient. This reduces the risk of dizzy or fainting spells. What side effects may I notice from receiving this medicine? Side effects that you should report to your doctor or health care professional as soon as possible: -allergic reactions like skin rash, itching or hives, swelling of the face, lips, or tongue -breathing problems -feeling faint or lightheaded, falls -fever -high blood pressure -irregular heartbeat or chest pain -muscle cramps or weakness -pain, tingling,  numbness in the hands or feet -vomiting Side effects that usually do not require medical attention (report to your doctor or health care professional if they continue or are bothersome): -blurred  vision -cough -difficulty passing urine -difficulty sleeping -headache -nervousness or trembling -stuffy or runny nose -unusual taste -upset stomach This list may not describe all possible side effects. Call your doctor for medical advice about side effects. You may report side effects to FDA at 1-800-FDA-1088. Where should I keep my medicine? Keep out of the reach of children. Store at a room temperature between 15 and 30 degrees C (59 and 86 degrees F). Do not freeze. This medicine does not work as well if it is too cold. Protect from humidity. Never throw the container into a fire or incinerator. Keep track of the number of sprays used and discard after 200 sprays. Throw away any unused medicine after the expiration date. NOTE: This sheet is a summary. It may not cover all possible information. If you have questions about this medicine, talk to your doctor, pharmacist, or health care provider.  2014, [employer]. (2013-03-26 08:58:39)  Fall Prevention and Home Safety Falls cause injuries and can affect all age groups. It is possible to use preventive measures to significantly decrease the likelihood of falls. There are many simple measures which can make your home safer and prevent falls. OUTDOORS  Repair cracks and edges of walkways and driveways.  Remove high doorway thresholds.  Trim shrubbery on the main path into your home.  Have good outside lighting.  Clear walkways of tools, rocks, debris, and clutter.  Check that handrails are not broken and are securely fastened. Both sides of steps should have handrails.  Have leaves, snow, and ice cleared regularly.  Use sand or salt on walkways during winter months.  In the garage, clean up grease or oil spills. BATHROOM  Install night lights.  Install grab bars by the toilet and in the tub and shower.  Use non-skid mats or decals in the tub or shower.  Place a plastic non-slip stool in the shower to sit on, if  needed.  Keep floors dry and clean up all water on the floor immediately.  Remove soap buildup in the tub or shower on a regular basis.  Secure bath mats with non-slip, double-sided rug tape.  Remove throw rugs and tripping hazards from the floors. BEDROOMS  Install night lights.  Make sure a bedside light is easy to reach.  Do not use oversized bedding.  Keep a telephone by your bedside.  Have a firm chair with side arms to use for getting dressed.  Remove throw rugs and tripping hazards from the floor. KITCHEN  Keep handles on pots and pans turned toward the center of the stove. Use back burners when possible.  Clean up spills quickly and allow time for drying.  Avoid walking on wet floors.  Avoid hot utensils and knives.  Position shelves so they are not too high or low.  Place commonly used objects within easy reach.  If necessary, use a sturdy step stool with a grab bar when reaching.  Keep electrical cables out of the way.  Do not use floor polish or wax that makes floors slippery. If you must use wax, use non-skid floor wax.  Remove throw rugs and tripping hazards from the floor. STAIRWAYS  Never leave objects on stairs.  Place handrails on both sides of stairways and use them. Fix any loose handrails. Make sure handrails on both  sides of the stairways are as long as the stairs.  Check carpeting to make sure it is firmly attached along stairs. Make repairs to worn or loose carpet promptly.  Avoid placing throw rugs at the top or bottom of stairways, or properly secure the rug with carpet tape to prevent slippage. Get rid of throw rugs, if possible.  Have an electrician put in a light switch at the top and bottom of the stairs. OTHER FALL PREVENTION TIPS  Wear low-heel or rubber-soled shoes that are supportive and fit well. Wear closed toe shoes.  When using a stepladder, make sure it is fully opened and both spreaders are firmly locked. Do not climb a  closed stepladder.  Add color or contrast paint or tape to grab bars and handrails in your home. Place contrasting color strips on first and last steps.  Learn and use mobility aids as needed. Install an electrical emergency response system.  Turn on lights to avoid dark areas. Replace light bulbs that burn out immediately. Get light switches that glow.  Arrange furniture to create clear pathways. Keep furniture in the same place.  Firmly attach carpet with non-skid or double-sided tape.  Eliminate uneven floor surfaces.  Select a carpet pattern that does not visually hide the edge of steps.  Be aware of all pets. OTHER HOME SAFETY TIPS  Set the water temperature for 120 F (48.8 C).  Keep emergency numbers on or near the telephone.  Keep smoke detectors on every level of the home and near sleeping areas. Document Released: 10/04/2002 Document Revised: 04/14/2012 Document Reviewed: 01/03/2012 [facility] Patient Information 2014 [place].

## 2014-03-30 ENCOUNTER — Encounter (HOSPITAL_COMMUNITY): Payer: Medicare Other

## 2014-03-30 ENCOUNTER — Ambulatory Visit (HOSPITAL_COMMUNITY): Admission: RE | Admit: 2014-03-30 | Payer: Medicare Other | Source: Ambulatory Visit

## 2014-03-31 ENCOUNTER — Encounter: Payer: Self-pay | Admitting: Radiation Oncology

## 2014-03-31 ENCOUNTER — Ambulatory Visit (INDEPENDENT_AMBULATORY_CARE_PROVIDER_SITE_OTHER): Payer: Medicare Other | Admitting: Cardiovascular Disease

## 2014-03-31 ENCOUNTER — Encounter: Payer: Self-pay | Admitting: Cardiovascular Disease

## 2014-03-31 ENCOUNTER — Ambulatory Visit
Admission: RE | Admit: 2014-03-31 | Discharge: 2014-03-31 | Disposition: A | Discharge status: Home / Self Care | Payer: Medicare Other | Source: Ambulatory Visit | Attending: Radiation Oncology | Admitting: Radiation Oncology

## 2014-03-31 VITALS — BP 120/63 | HR 106 | Resp 16 | Ht 71.0 in | Wt 217.8 lb

## 2014-03-31 VITALS — BP 101/63 | HR 89 | Temp 97.8°F | Resp 24 | Ht 71.5 in | Wt 218.0 lb

## 2014-03-31 DIAGNOSIS — J449 Chronic obstructive pulmonary disease, unspecified: Secondary | ICD-10-CM | POA: Insufficient documentation

## 2014-03-31 DIAGNOSIS — C8589 Other specified types of non-Hodgkin lymphoma, extranodal and solid organ sites: Secondary | ICD-10-CM | POA: Insufficient documentation

## 2014-03-31 DIAGNOSIS — R55 Syncope and collapse: Secondary | ICD-10-CM

## 2014-03-31 DIAGNOSIS — J4489 Other specified chronic obstructive pulmonary disease: Secondary | ICD-10-CM | POA: Insufficient documentation

## 2014-03-31 DIAGNOSIS — Z510 Encounter for antineoplastic radiation therapy: Secondary | ICD-10-CM | POA: Diagnosis present

## 2014-03-31 DIAGNOSIS — E119 Type 2 diabetes mellitus without complications: Secondary | ICD-10-CM | POA: Diagnosis not present

## 2014-03-31 DIAGNOSIS — E785 Hyperlipidemia, unspecified: Secondary | ICD-10-CM | POA: Insufficient documentation

## 2014-03-31 DIAGNOSIS — M25519 Pain in unspecified shoulder: Secondary | ICD-10-CM | POA: Insufficient documentation

## 2014-03-31 DIAGNOSIS — I739 Peripheral vascular disease, unspecified: Secondary | ICD-10-CM | POA: Insufficient documentation

## 2014-03-31 DIAGNOSIS — I10 Essential (primary) hypertension: Secondary | ICD-10-CM | POA: Insufficient documentation

## 2014-03-31 DIAGNOSIS — R042 Hemoptysis: Secondary | ICD-10-CM | POA: Insufficient documentation

## 2014-03-31 DIAGNOSIS — I2510 Atherosclerotic heart disease of native coronary artery without angina pectoris: Secondary | ICD-10-CM | POA: Diagnosis not present

## 2014-03-31 DIAGNOSIS — C3410 Malignant neoplasm of upper lobe, unspecified bronchus or lung: Secondary | ICD-10-CM | POA: Diagnosis not present

## 2014-03-31 DIAGNOSIS — G609 Hereditary and idiopathic neuropathy, unspecified: Secondary | ICD-10-CM | POA: Diagnosis not present

## 2014-03-31 DIAGNOSIS — Z7982 Long term (current) use of aspirin: Secondary | ICD-10-CM | POA: Diagnosis not present

## 2014-03-31 DIAGNOSIS — Z79899 Other long term (current) drug therapy: Secondary | ICD-10-CM | POA: Diagnosis not present

## 2014-03-31 DIAGNOSIS — Z87891 Personal history of nicotine dependence: Secondary | ICD-10-CM | POA: Insufficient documentation

## 2014-03-31 DIAGNOSIS — Z794 Long term (current) use of insulin: Secondary | ICD-10-CM | POA: Insufficient documentation

## 2014-03-31 DIAGNOSIS — C3412 Malignant neoplasm of upper lobe, left bronchus or lung: Secondary | ICD-10-CM

## 2014-03-31 HISTORY — DX: Malignant neoplasm of upper lobe, unspecified bronchus or lung: C34.10

## 2014-03-31 NOTE — Progress Notes (Signed)
Please see the Nurse Progress Note in the MD Initial Consult Encounter for this patient. 

## 2014-03-31 NOTE — Patient Instructions (Signed)
Cardiac Event Monitoring A cardiac event monitor is a small recording device used to help detect abnormal heart rhythms (arrhythmias). The monitor is used to record heart rhythm when noticeable symptoms such as the following occur:  Fast heart beats (palpitations), such as heart racing or fluttering.  Dizziness.  Fainting or lightheadedness.  Unexplained weakness. The monitor is wired to two electrodes placed on your chest. Electrodes are flat, sticky disks that attach to your skin. The monitor can be worn for up to 30 days. You will wear the monitor at all times, except when bathing.  HOW TO USE YOUR CARDIAC EVENT MONITOR A technician will prepare your chest for the electrode placement. The technician will show you how to place the electrodes, how to work the monitor, and how to replace the batteries. Take time to practice using the monitor before you leave the office. Make sure you understand how to send the information from the monitor to your health care provider. This requires a telephone with a landline, not a cellphone. You need to:  Wear your monitor at all times, except when you are in water:  Do not get the monitor wet.  Take the monitor off when bathing. Do not swim or use a hot tub with it on.  Keep your skin clean. Do not put body lotion or moisturizer on your chest.  Change the electrodes daily or any time they stop sticking to your skin. You might need to use tape to keep them on.  It is possible that your skin under the electrodes could become irritated. To keep this from happening, try to put the electrodes in slightly different places on your chest. However, they must remain in the area under your left breast and in the upper right section of your chest.  Make sure the monitor is safely clipped to your clothing or in a location close to your body that your health care provider recommends.  Press the button to record when you feel symptoms of heart trouble, such as  dizziness, weakness, lightheadedness, palpitations, thumping, shortness of breath, unexplained weakness, or a fluttering or racing heart. The monitor is always on and records what happened slightly before you pressed the button, so do not worry about being too late to get good information.  Keep a diary of your activities, such as walking, doing chores, and taking medicine. It is especially important to note what you were doing when you pushed the button to record your symptoms. This will help your health care provider determine what might be contributing to your symptoms. The information stored in your monitor will be reviewed by your health care provider alongside your diary entries.  Send the recorded information as recommended by your health care provider. It is important to understand that it will take some time for your health care provider to process the results.  Change the batteries as recommended by your health care provider. SEEK IMMEDIATE MEDICAL CARE IF:   You have chest pain.  You have extreme difficulty breathing or shortness of breath.  You develop a very fast heartbeat that persists.  You develop dizziness that does not go away .  You faint or constantly feel you are about to faint. Document Released: 07/23/2008 Document Revised: 06/16/2013 Document Reviewed: 04/12/2013 [facility] Patient Information 2014 [place], [place].  Your physician recommends that you schedule a follow-up appointment in: 3 months with Dr. Sallyanne Kuster

## 2014-03-31 NOTE — Progress Notes (Signed)
  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  Identity was confirmed.  All relevant records and images related to the planned course of therapy were reviewed.  The patient freely provided informed written consent to proceed with treatment after reviewing the details related to the planned course of therapy. The consent form was witnessed and verified by the simulation staff.  Then, the patient was set-up in a stable reproducible  supine position for radiation therapy.  CT images were obtained.  Surface markings were placed.  The CT images were loaded into the planning software.  Then the target and avoidance structures were contoured.  Treatment planning then occurred.  The radiation prescription was entered and confirmed.  Then, I designed and supervised the construction of a total of 0 medically necessary complex treatment devices.  I have requested : 3D Simulation  I have requested a DVH of the following structures: GTV, PTV, lungs, heart, spinal cord.  I have ordered:dose calc.  PLAN:  The patient will receive 63 Gy in 35 fractions with radiosensitizing chemotherapy unless staging workup reveals distant metastasis.  ________________________________   Special treatment procedure note  The patient will be receiving radiosensitizing chemotherapy throughout his chest radiation treatments. Given the increased potential for toxicities as well as the necessity for close monitoring of the patient and bloodwork, this constitutes a special treatment procedure.       Blair Promise, PhD, MD

## 2014-03-31 NOTE — Progress Notes (Signed)
Radiation Oncology         [phone] ________________________________  Initial outpatient Consultation  Name: [name] [name] MRN: [id number]  Date: 03/31/2014  DOB: 1933/02/21  BP:ZWCHEN,IDPO A, MD  Ladell Pier, MD   REFERRING PHYSICIAN: Ladell Pier, MD  DIAGNOSIS:Squamous cell carcinoma of the left lung presenting with left upper lobe bronchial obstruction and collapse   HISTORY OF PRESENT ILLNESS::[name] [name] is a 80 y.o. male who is seen out of the courtesy of Dr. Benay Spice for an opinion concerning radiation therapy as part of management of patient's recently diagnosed non-small cell lung cancer. The patient is a former smoker with a history of COPD and Non Hodgkins lymphoma in remission.  He presented to the [facility] ER on 5/17 with a three week history of increasing shortness of breath and an isolated syncopal episode without trauma to head. He denied hemoptysis but he has productive cough. No cardiac complaints. No pleuritic chest pain. He does report a 40 lb weight loss over the last year despite good appetite. A CT angio of the Chest on 5/18 was negative for PE but did show a left upper lobe perihilar mass with complete occlusion of the left upper lobe bronchus, and marked narrowing of several left-sided pulmonary artery branches highly suspicious for primary bronchogenic malignancy.He underwent Fiberoptic bronchoscopy with bronchial washings on 5/18.  Pathology from this procedure revealed squamous cell carcinoma. Earlier this week the patient was scheduled to complete his staging workup with a PET scan and MRI the brain however this was canceled as the patient ingested food prior to his PET scan. Patient is now seen in radiation oncology for consideration for treatment.   PREVIOUS RADIATION THERAPY: No  PAST MEDICAL HISTORY:  has a past medical history of Peripheral arterial disease; Diabetes mellitus without complication; CAD (coronary artery disease);  Hyperlipidemia; Hypertension; Non Hodgkin's lymphoma (2000, 2010); Peripheral neuropathy; COPD (chronic obstructive pulmonary disease); History of lower GI bleeding; Substance abuse; and Lung cancer, upper lobe.    PAST SURGICAL HISTORY: Past Surgical History  Procedure Laterality Date  . Fracture surgery  2009    Left ankle  . Pr vein bypass graft,aorto-fem-pop  2005    Right common femoral to BK popliteal BPG  . Pr vein bypass graft,aorto-fem-pop  01-27-05    Revision of Right fem-pop BPG  . Cardiac catheterization  1990's  . Video bronchoscopy Bilateral 03/14/2014    Procedure: VIDEO BRONCHOSCOPY WITH FLUORO;  Surgeon: Rigoberto Noel, MD;  Location: [place];  Service: Cardiopulmonary;  Laterality: Bilateral;    FAMILY HISTORY: family history includes Cancer in his mother; Heart attack in his mother; Hyperlipidemia in his brother, daughter, father, and sister; Hypertension in his son; Stroke in his father.  SOCIAL HISTORY:  reports that he quit smoking about 10 years ago. His smoking use included Cigarettes. He has a 50 pack-year smoking history. He has never used smokeless tobacco. He reports that he does not drink alcohol or use illicit drugs. he is divorced. He is retired Administrator  ALLERGIES: Review of patient's allergies indicates no known allergies.  MEDICATIONS:  Current Outpatient Prescriptions  Medication Sig Dispense Refill  . Acetaminophen (TYLENOL ARTHRITIS PAIN PO) Take 650 mg by mouth 2 (two) times daily.       Marland Kitchen aspirin 81 MG tablet Take 160 mg by mouth 2 (two) times daily.      Marland Kitchen CINNAMON PO Take 1,000 mg by mouth 2 (two) times daily.      Marland Kitchen  doxycycline (VIBRA-TABS) 100 MG tablet Take 100 mg by mouth daily.       . DULoxetine (CYMBALTA) 30 MG capsule Take 30 mg by mouth daily.      . fenofibrate micronized (LOFIBRA) 200 MG capsule Take 200 mg by mouth daily before breakfast.      . finasteride (PROSCAR) 5 MG tablet Take 5 mg by mouth daily.      Marland Kitchen gabapentin  (NEURONTIN) 600 MG tablet Take 600 mg by mouth 3 (three) times daily.       Marland Kitchen glucosamine-chondroitin 500-400 MG tablet Take 1 tablet by mouth 2 (two) times daily.      . Insulin Glulisine (APIDRA SOLOSTAR) 100 UNIT/ML Solostar Pen Inject 3-7 Units into the skin See admin instructions. Prn over 250      . Insulin Lispro Prot & Lispro (HUMALOG MIX 75/25 KWIKPEN) (75-25) 100 UNIT/ML Kwikpen Inject 10 Units into the skin 2 (two) times daily.  15 mL  11  . Ipratropium-Albuterol (COMBIVENT) 20-100 MCG/ACT AERS respimat Inhale 1 puff into the lungs every 6 (six) hours. As needed for wheezing-  1 Inhaler  0  . metFORMIN (GLUCOPHAGE) 1000 MG tablet Take 1,000 mg by mouth 2 (two) times daily with a meal.      . nitroGLYCERIN (NITROLINGUAL) 0.4 MG/SPRAY spray Place 1 spray under the tongue every 5 (five) minutes x 3 doses as needed for chest pain.  12 g  12  . rosuvastatin (CRESTOR) 10 MG tablet Take 10 mg by mouth at bedtime.       . Tamsulosin HCl (FLOMAX) 0.4 MG CAPS Take 0.4 mg by mouth daily.      . metoprolol tartrate (LOPRESSOR) 25 MG tablet Take 0.5 tablets (12.5 mg total) by mouth 2 (two) times daily.  30 tablet  0   No current facility-administered medications for this encounter.    REVIEW OF SYSTEMS:  A 15 point review of systems is documented in the electronic medical record. This was obtained by the nursing staff. However, I reviewed this with the patient to discuss relevant findings and make appropriate changes. He does have a nonproductive mild cough. Patient occasionally will have episodes of angina relieved with nitroglycerin.  He does have dyspnea with exertion.  He denies any consistent pain within the chest. He denies any problems with swallowing foods. Patient denies any headaches dizziness or blurred vision. He denies any new bony pain.   PHYSICAL EXAM:  height is 5' 11.5" (1.816 m) and weight is 218 lb (98.884 kg). His oral temperature is 97.8 F (36.6 C). His blood pressure is 101/63  and his pulse is 89. His respiration is 24 and oxygen saturation is 95%.   BP 101/63  Pulse 89  Temp(Src) 97.8 F (36.6 C) (Oral)  Resp 24  Ht 5' 11.5" (1.816 m)  Wt 218 lb (98.884 kg)  BMI 29.98 kg/m2  SpO2 95%  General Appearance:    Alert, cooperative, no distress, appears stated age  Head:    Normocephalic, without obvious abnormality, atraumatic  Eyes:    PERRL, conjunctiva/corneas clear, EOM's intact,           Ears:    Normal TM's and external ear canals, both ears  Nose:   Nares normal, septum midline, mucosa normal, no drainage    or sinus tenderness  Throat:   Lips, mucosa, and tongue normal; teeth and gums normal  Neck:   Supple, symmetrical, trachea midline, no adenopathy;       thyroid:  No enlargement/tenderness/nodules; no carotid   bruit or JVD  Back:     Symmetric, no curvature, ROM normal, no CVA tenderness  Lungs:    some audible wheezing noted. With auscultation wheezing is noted in the left chest region particularly with inspiration   Chest wall:    No tenderness or deformity  Heart:    Regular rate and rhythm, S1 and S2 normal, with 2/6 systolic murmur   Abdomen:     Soft, non-tender, bowel sounds active all four quadrants,    no masses, no organomegaly        Extremities:   Extremities normal, atraumatic, no cyanosis or edema  Pulses:   2+ and symmetric all extremities  Skin:   Skin color, texture, turgor normal, no rashes or lesions  Lymph nodes:   Cervical, supraclavicular, and axillary nodes normal  Neurologic:   Normal strength, sensation and reflexes      throughout     ECOG = 1    1 - Symptomatic but completely ambulatory (Restricted in physically strenuous activity but ambulatory and able to carry out work of a light or sedentary nature. For example, light housework, office work)   LABORATORY DATA:  Lab Results  Component Value Date   WBC 7.5 03/23/2014   HGB 11.7* 03/23/2014   HCT 36.2* 03/23/2014   MCV 86.0 03/23/2014   PLT 217 03/23/2014    NEUTROABS 6.2 03/21/2014   Lab Results  Component Value Date   NA 134* 03/23/2014   K 4.5 03/23/2014   CL 99 03/23/2014   CO2 24 03/23/2014   GLUCOSE 206* 03/23/2014   CREATININE 0.86 03/23/2014   CALCIUM 10.0 03/23/2014      RADIOGRAPHY: Dg Chest 2 View  03/14/2014   CLINICAL DATA:  LOSS OF CONSCIOUSNESS SHORTNESS OF BREATH  New Renal having  EXAM: CHEST  2 VIEW  COMPARISON:  NM PET IMAGE RESTAG (PS) SKULL BASE TO THIGH dated 02/20/2009; DG CHEST 2 VIEW dated 12/19/2007  FINDINGS: Cardiac silhouette within the upper limits normal. Calcified mitral valve annulus. Atherosclerotic calcification in the aorta. A 6 cm consolidative masslike left perihilar density with spiculation. There is volume loss in the left hemi thorax. There is flattening of the right hemidiaphragm and blunting of the right costophrenic angle. The lungs otherwise clear. Degenerative changes within the shoulders.  IMPRESSION: Consolidative masslike density in the left perihilar region concerning for soft tissue mass until proven otherwise. Further evaluation with contrast chest CT recommended.  COPD   Electronically Signed   By: Margaree Mackintosh M.D.   On: 03/14/2014 01:18   Ct Head Wo Contrast  03/21/2014   CLINICAL DATA:  Fall, scalp hematoma  EXAM: CT HEAD WITHOUT CONTRAST  TECHNIQUE: Contiguous axial images were obtained from the base of the skull through the vertex without intravenous contrast.  COMPARISON:  CT PET scan 02/20/2009  FINDINGS: No skull fracture is noted. Paranasal sinuses and mastoid air cells are unremarkable. Mild cerebral atrophy again noted. Atherosclerotic calcifications of carotid siphon. No intracranial hemorrhage, mass effect or midline shift. No acute cortical infarction. No mass lesion is noted on this unenhanced scan.  IMPRESSION: No acute intracranial abnormality.  Mild cerebral atrophy.   Electronically Signed   By: Lahoma Crocker M.D.   On: 03/21/2014 15:41   Ct Angio Chest Pe W/cm &/or Wo Cm  03/14/2014    CLINICAL DATA:  Weakness and shortness of breath for the past 3 weeks. Abnormal chest x-ray.  EXAM: CT ANGIOGRAPHY CHEST WITH CONTRAST  TECHNIQUE: Multidetector CT imaging of the chest was performed using the standard protocol during bolus administration of intravenous contrast. Multiplanar CT image reconstructions and MIPs were obtained to evaluate the vascular anatomy.  CONTRAST:  84mL OMNIPAQUE IOHEXOL 350 MG/ML SOLN  COMPARISON:  PET-CT 02/20/2009.  FINDINGS: Mediastinum: There are no filling defects within the pulmonary arterial tree to suggest underlying pulmonary embolism. There is, however, severe narrowing of several pulmonary artery branches to the left upper lobe, related to a presumed perihilar mass in a left upper lobe as well as left hilar lymphadenopathy. This hilar discretion this hilar lymphadenopathy is difficult to discretely measure on today's examination. No other definite mediastinal or right hilar adenopathy is noted. Heart size is normal. There is no significant pericardial fluid, thickening or pericardial calcification. There is atherosclerosis of the thoracic aorta, the great vessels of the mediastinum and the coronary arteries, including calcified atherosclerotic plaque in the left main, left anterior descending, left circumflex and right coronary arteries. Thickening and calcification of the aortic valve. Severe mitral annular calcifications.  Lungs/Pleura: Complete atelectasis of the left upper lobe. In the perihilar aspect of the left upper lobe the tissue exerts mass effect upon the adjacent bronchovascular structures, with marked narrowing of multiple pulmonary artery branches, and complete obliteration of the left upper lobe bronchus best demonstrated on image 50 of series 406. Left lower lobe is reasonably well aerated, although there is some peripheral ground-glass attenuation and subpleural reticulation in the inferior aspect of the left lower lobe.  Upper Abdomen: Calcified  gallstone lying dependently in the gallbladder.  Musculoskeletal: There are no aggressive appearing lytic or blastic lesions noted in the visualized portions of the skeleton.  Review of the MIP images confirms the above findings.  IMPRESSION: 1. No evidence of pulmonary embolism. 2. However, there is an apparent left upper lobe perihilar mass with complete occlusion of the left upper lobe bronchus, complete atelectasis of the left upper lobe, and marked narrowing of several left-sided pulmonary artery branches. Findings are highly suspicious for primary bronchogenic malignancy, and correlation with PET-CT and/or bronchoscopy for diagnostic and staging purposes is strongly recommended in the near future. 3. Atherosclerosis, including left main and 3 vessel coronary artery disease. Assessment for potential risk factor modification, dietary therapy or pharmacologic therapy may be warranted, if clinically indicated. 4. There are calcifications of the aortic valve and mitral annulus. Echocardiographic correlation for evaluation of potential valvular dysfunction may be warranted if clinically indicated. 5. Additional incidental findings, as above. These results were called by telephone at the time of interpretation on 03/14/2014 at 3:13 AM to Dr. Elyn Peers, who verbally acknowledged these results.   Electronically Signed   By: Vinnie Langton M.D.   On: 03/14/2014 03:15   Dg Chest Portable 1 View  03/21/2014   CLINICAL DATA:  80 year old male with shortness of breath following fall.  EXAM: PORTABLE CHEST - 1 VIEW  COMPARISON:  03/14/2014 chest radiograph and CT.  FINDINGS: A left perihilar mass is again noted and relatively unchanged.  New haziness overlying the left hemi thorax may represent a layering effusion.  There is no evidence of pneumothorax or right pleural effusion.  The right lung is clear.  No acute bony abnormalities are identified.  IMPRESSION: New haziness overlying the left hemi thorax which could  represent a layering pleural effusion.  Unchanged right perihilar mass.   Electronically Signed   By: Hassan Rowan M.D.   On: 03/21/2014 15:18      IMPRESSION:Squamous cell carcinoma  of the left lung presenting with left upper lobe bronchial obstruction and collapse. Patient is noted to have significant wheezing today and he is becoming more symptomatic with this shortness of breath. Would recommend proceeding with radiation therapy directed at the left chest region along with radiosensitizing chemotherapy prior to staging workup completion. Depending on the results of the patient's PET scan he will either receive a definitive course of approximately 7 weeks of radiation therapy or 3-4 weeks if he is found to have distant metastasis.   PLAN: Simulation and planning later today with treatments to begin June 8  I spent 60 minutes minutes face to face with the patient and more than 50% of that time was spent in counseling and/or coordination of care.   ------------------------------------------------  Blair Promise, PhD, MD

## 2014-04-01 ENCOUNTER — Encounter: Payer: Self-pay | Admitting: Cardiovascular Disease

## 2014-04-01 NOTE — Progress Notes (Signed)
Patient ID: [name] [name], male   DOB: 05-18-1933, 80 y.o.   MRN: [id number]      Reason for office visit Syncope  [name] [name] is a 80 year old man recently diagnosed with non-small cell cancer of the left lung (squamous cell) is due to start radiation therapy next week. He is also suspected of having angina pectoris. He has nitroglycerin responsive exertional chest pain, but has normal left ventricular systolic function and a very small area of abnormality on nuclear perfusion imaging. For this reason we have pursued conservative management and he has not undergone invasive angiography or revascularization. He has minor intraventricular conduction delay with a QRS duration of 118 ms, not quite to the left bundle branch block.  He has had 2 recent hospitalizations for syncope, both of them attributed to hypotension and possibly hypoglycemia. He has lost a lot of weight (roughly 40 pounds) and it was felt that his old medications have become excessive. Both syncopal events were preceded by weakness and dizziness. Neither episode was preceded by chest pain or immediate use of sublingual nitroglycerin. On both occasions the loss of consciousness was brief (less than a minute) and at least on one occasion hypotension was confirmed and improved with intravenous fluids. On his last admission on May 25 he was also found to be hypoglycemic with a glucose level that at one point dropped to the 30s.  He has a left upper lobe perihilar mass with complete occlusion of the left upper lung bronchus and bronchoscopy obtain specimens positive for squamous cell carcinoma. CT scan of his chest (not coronary dedicated) showed atherosclerotic plaque in the coronary arteries including the left main and all 3 major coronary branches.   No Known Allergies  Current Outpatient Prescriptions  Medication Sig Dispense Refill  . Acetaminophen (TYLENOL ARTHRITIS PAIN PO) Take 650 mg by mouth 2 (two) times daily.       Marland Kitchen aspirin  81 MG tablet Take 160 mg by mouth 2 (two) times daily.      Marland Kitchen CINNAMON PO Take 1,000 mg by mouth 2 (two) times daily.      Marland Kitchen doxycycline (VIBRA-TABS) 100 MG tablet Take 100 mg by mouth daily.       . DULoxetine (CYMBALTA) 30 MG capsule Take 30 mg by mouth daily.      . fenofibrate micronized (LOFIBRA) 200 MG capsule Take 200 mg by mouth daily before breakfast.      . finasteride (PROSCAR) 5 MG tablet Take 5 mg by mouth daily.      Marland Kitchen gabapentin (NEURONTIN) 600 MG tablet Take 600 mg by mouth 3 (three) times daily.       Marland Kitchen glucosamine-chondroitin 500-400 MG tablet Take 1 tablet by mouth 2 (two) times daily.      . Insulin Glulisine (APIDRA SOLOSTAR) 100 UNIT/ML Solostar Pen Inject 3-7 Units into the skin See admin instructions. Prn over 250      . Insulin Lispro Prot & Lispro (HUMALOG MIX 75/25 KWIKPEN) (75-25) 100 UNIT/ML Kwikpen Inject 10 Units into the skin 2 (two) times daily.  15 mL  11  . Ipratropium-Albuterol (COMBIVENT) 20-100 MCG/ACT AERS respimat Inhale 1 puff into the lungs every 6 (six) hours. As needed for wheezing-  1 Inhaler  0  . metFORMIN (GLUCOPHAGE) 1000 MG tablet Take 1,000 mg by mouth 2 (two) times daily with a meal.      . metoprolol tartrate (LOPRESSOR) 25 MG tablet Take 0.5 tablets (12.5 mg total) by mouth 2 (two) times daily.  30 tablet  0  . nitroGLYCERIN (NITROLINGUAL) 0.4 MG/SPRAY spray Place 1 spray under the tongue every 5 (five) minutes x 3 doses as needed for chest pain.  12 g  12  . rosuvastatin (CRESTOR) 10 MG tablet Take 10 mg by mouth at bedtime.       . Tamsulosin HCl (FLOMAX) 0.4 MG CAPS Take 0.4 mg by mouth daily.       No current facility-administered medications for this visit.    Past Medical History  Diagnosis Date  . Peripheral arterial disease   . Diabetes mellitus without complication   . CAD (coronary artery disease)   . Hyperlipidemia   . Hypertension   . Non Hodgkin's lymphoma 2000, 2010  . Peripheral neuropathy   . COPD (chronic obstructive  pulmonary disease)   . History of lower GI bleeding   . Substance abuse   . Lung cancer, upper lobe     left lung    Past Surgical History  Procedure Laterality Date  . Fracture surgery  2009    Left ankle  . Pr vein bypass graft,aorto-fem-pop  2005    Right common femoral to BK popliteal BPG  . Pr vein bypass graft,aorto-fem-pop  01-27-05    Revision of Right fem-pop BPG  . Cardiac catheterization  1990's  . Video bronchoscopy Bilateral 03/14/2014    Procedure: VIDEO BRONCHOSCOPY WITH FLUORO;  Surgeon: Rigoberto Noel, MD;  Location: [place];  Service: Cardiopulmonary;  Laterality: Bilateral;    Family History  Problem Relation Age of Onset  . Cancer Mother     Colon  . Heart attack Mother   . Stroke Father   . Hyperlipidemia Father   . Hyperlipidemia Sister   . Hyperlipidemia Brother   . Hyperlipidemia Daughter   . Hypertension Son     History   Social History  . Marital Status: Divorced    Spouse Name: N/A    Number of Children: 2  . Years of Education: N/A   Occupational History  . retired Administrator    Social History Main Topics  . Smoking status: Former Smoker -- 1.00 packs/day for 50 years    Types: Cigarettes    Quit date: 10/29/2003  . Smokeless tobacco: Never Used  . Alcohol Use: No  . Drug Use: No  . Sexual Activity: No   Other Topics Concern  . Not on file   Social History Narrative  . No narrative on file    Review of systems: She continues to have exertional angina pectoris. His overall use of sublingual nitroglycerin substantially lower than at his previous appointment. He has used less than half a bottle of nitroglycerin glycerin spray in the last 3 months. As mentioned he has had 2 syncopal events. He denies change in his chronic pattern of dyspnea on exertion. He has not had pleuritic chest pain.  PHYSICAL EXAM BP 120/63  Pulse 106  Resp 16  Ht 5\' 11"  (1.803 m)  Wt 217 lb 12.8 oz (98.793 kg)  BMI 30.39 kg/m2 General: Alert,  oriented x3, no distress  Head: no evidence of trauma, PERRL, EOMI, no exophtalmos or lid lag, no myxedema, no xanthelasma; normal ears, nose and oropharynx  Neck: normal jugular venous pulsations and no hepatojugular reflux; brisk carotid pulses without delay and no carotid bruits  Chest: Diminished breath sounds at the left upper lung, localized wheezing over the left posterior lung field., no signs of consolidation by percussion or palpation, normal fremitus, symmetrical and full  respiratory excursions  Cardiovascular: normal position and quality of the apical impulse, regular rhythm, normal first and second heart sounds, no rubs or gallops, 3/6 holosystolic murmur at apex, radiating towards base, but especially towards axilla.  Abdomen: no tenderness or distention, no masses by palpation, no abnormal pulsatility or arterial bruits, normal bowel sounds, no hepatosplenomegaly  Extremities: no clubbing, cyanosis or edema; 2+ radial, ulnar and brachial pulses bilaterally; 2+ right femoral, posterior tibial and dorsalis pedis pulses; 2+ left femoral, posterior tibial and dorsalis pedis pulses; no subclavian or femoral bruits  Neurological: grossly nonfocal  unexplained weight gain, cough, hemoptysis or wheezing.   EKG: Sinus rhythm, incomplete left bundle branch block, inverted T waves in 1 and aVL, unchanged  Lipid Panel  No results found for this basename: chol, trig, hdl, cholhdl, vldl, ldlcalc    BMET    Component Value Date/Time   NA 134* 03/23/2014 0340   K 4.5 03/23/2014 0340   CL 99 03/23/2014 0340   CO2 24 03/23/2014 0340   GLUCOSE 206* 03/23/2014 0340   BUN 10 03/23/2014 0340   CREATININE 0.86 03/23/2014 0340   CALCIUM 10.0 03/23/2014 0340   GFRNONAA 81* 03/23/2014 0340   GFRAA >90 03/23/2014 0340     ASSESSMENT AND PLAN  There is compelling clinical and imaging data to suggest that [name] [name] has extensive coronary artery disease, but since he has well-controlled symptoms and  normal left ventricular systolic function, we'll continue conservative management. The recent diagnosis of lung cancer that is not surgically curable reinforced this approach. I am concerned though, as his weight decreases in blood pressure follows, that anti-anginal therapy will become more difficult. Ranexa would become a good option. Most of the clinical features suggest a syncopal events were indeed secondary to hypotension and/or hypoglycemia. There is also suspicion for arrhythmia in view of his age and the presence of intraventricular conduction abnormalities. I recommended that he wear a 14 day event monitor.  Orders Placed This Encounter  Procedures  . Cardiac event monitor   No orders of the defined types were placed in this encounter.    Hal Norrington  Sanda Klein, MD, [facility] [phone] office [phone] pager

## 2014-04-04 ENCOUNTER — Telehealth: Payer: Self-pay | Admitting: Cardiovascular Disease

## 2014-04-04 ENCOUNTER — Other Ambulatory Visit: Payer: Medicare Other

## 2014-04-04 ENCOUNTER — Other Ambulatory Visit (HOSPITAL_BASED_OUTPATIENT_CLINIC_OR_DEPARTMENT_OTHER): Payer: Medicare Other

## 2014-04-04 DIAGNOSIS — Z87898 Personal history of other specified conditions: Secondary | ICD-10-CM

## 2014-04-04 DIAGNOSIS — N189 Chronic kidney disease, unspecified: Secondary | ICD-10-CM

## 2014-04-04 DIAGNOSIS — C3410 Malignant neoplasm of upper lobe, unspecified bronchus or lung: Secondary | ICD-10-CM

## 2014-04-04 DIAGNOSIS — R0600 Dyspnea, unspecified: Secondary | ICD-10-CM

## 2014-04-04 DIAGNOSIS — R0689 Other abnormalities of breathing: Principal | ICD-10-CM

## 2014-04-04 DIAGNOSIS — C3490 Malignant neoplasm of unspecified part of unspecified bronchus or lung: Secondary | ICD-10-CM

## 2014-04-04 LAB — CBC WITH DIFFERENTIAL/PLATELET
BASO%: 0.7 % (ref 0.0–2.0)
Basophils Absolute: 0.1 10*3/uL (ref 0.0–0.1)
EOS%: 0.1 % (ref 0.0–7.0)
Eosinophils Absolute: 0 10*3/uL (ref 0.0–0.5)
HEMATOCRIT: 36 % — AB (ref 38.4–49.9)
HGB: 11.2 g/dL — ABNORMAL LOW (ref 13.0–17.1)
LYMPH#: 1.1 10*3/uL (ref 0.9–3.3)
LYMPH%: 12.4 % — AB (ref 14.0–49.0)
MCH: 27.6 pg (ref 27.2–33.4)
MCHC: 31.3 g/dL — ABNORMAL LOW (ref 32.0–36.0)
MCV: 88.3 fL (ref 79.3–98.0)
MONO#: 0.3 10*3/uL (ref 0.1–0.9)
MONO%: 3.9 % (ref 0.0–14.0)
NEUT#: 7.2 10*3/uL — ABNORMAL HIGH (ref 1.5–6.5)
NEUT%: 82.9 % — AB (ref 39.0–75.0)
Platelets: 224 10*3/uL (ref 140–400)
RBC: 4.07 10*6/uL — ABNORMAL LOW (ref 4.20–5.82)
RDW: 16.5 % — ABNORMAL HIGH (ref 11.0–14.6)
WBC: 8.7 10*3/uL (ref 4.0–10.3)

## 2014-04-04 LAB — COMPREHENSIVE METABOLIC PANEL (CC13)
ALT: 17 U/L (ref 0–55)
AST: 13 U/L (ref 5–34)
Albumin: 2.9 g/dL — ABNORMAL LOW (ref 3.5–5.0)
Alkaline Phosphatase: 39 U/L — ABNORMAL LOW (ref 40–150)
Anion Gap: 10 mEq/L (ref 3–11)
BILIRUBIN TOTAL: 0.8 mg/dL (ref 0.20–1.20)
BUN: 17.7 mg/dL (ref 7.0–26.0)
CHLORIDE: 104 meq/L (ref 98–109)
CO2: 24 mEq/L (ref 22–29)
CREATININE: 1.1 mg/dL (ref 0.7–1.3)
Calcium: 9.5 mg/dL (ref 8.4–10.4)
Glucose: 400 mg/dl — ABNORMAL HIGH (ref 70–140)
Potassium: 4.4 mEq/L (ref 3.5–5.1)
Sodium: 137 mEq/L (ref 136–145)
Total Protein: 6.1 g/dL — ABNORMAL LOW (ref 6.4–8.3)

## 2014-04-04 NOTE — Telephone Encounter (Signed)
Abnormal monitor reading recorded. Fax received and given to Janett Labella, CMA to notify Dr. Sallyanne Kuster.

## 2014-04-05 ENCOUNTER — Encounter: Payer: Self-pay | Admitting: Radiation Oncology

## 2014-04-05 ENCOUNTER — Ambulatory Visit
Admission: RE | Admit: 2014-04-05 | Discharge: 2014-04-05 | Disposition: A | Discharge status: Home / Self Care | Payer: Medicare Other | Source: Ambulatory Visit | Attending: Radiation Oncology | Admitting: Radiation Oncology

## 2014-04-05 VITALS — BP 146/74 | HR 94 | Resp 16 | Wt 223.2 lb

## 2014-04-05 DIAGNOSIS — C3410 Malignant neoplasm of upper lobe, unspecified bronchus or lung: Secondary | ICD-10-CM

## 2014-04-05 DIAGNOSIS — Z510 Encounter for antineoplastic radiation therapy: Secondary | ICD-10-CM | POA: Diagnosis not present

## 2014-04-05 MED ORDER — HYDROMORPHONE HCL PF 1 MG/ML IJ SOLN
1.0000 mg | Freq: Once | INTRAMUSCULAR | Status: AC
  Administered 2014-04-05: 1 mg via INTRAMUSCULAR
  Filled 2014-04-05: qty 1

## 2014-04-05 MED ORDER — OXYCODONE-ACETAMINOPHEN 5-325 MG PO TABS: 1.0000 | ORAL_TABLET | ORAL | Status: DC | PRN

## 2014-04-05 MED ORDER — MORPHINE SULFATE 4 MG/ML IJ SOLN
2.0000 mg | Freq: Once | INTRAMUSCULAR | Status: DC
  Filled 2014-04-05: qty 1

## 2014-04-05 NOTE — Progress Notes (Signed)
  Radiation Oncology         [phone] ________________________________  Name: [name] [name] MRN: [id number]  Date: 04/05/2014  DOB: 1933/05/17  Weekly Radiation Therapy Management  Lung cancer, upper lobe   Primary site: Lung   Staging method: AJCC 7th Edition   Clinical: Stage Unknown (TX, N1, M0)    Summary: Stage Unknown (TX, N1, M0)   Current Dose: 1.8 Gy     Planned Dose:  63 Gy  Narrative . . . . . . . . The patient presents for routine under treatment assessment.                                   The patient is without complaint. He does have significant pain in his right shoulder when raising above his head for treatment. Patient was given 1 mg of Dilaudid prior to his treatment set up today. He is been given a prescription for Percocet to take at least one hour prior to his radiation therapy                                 Set-up films were reviewed.                                 The chart was checked. Physical Findings. . .  weight is 223 lb 3.2 oz (101.243 kg). His blood pressure is 146/74 and his pulse is 94. His respiration is 16. . Weight essentially stable. Decreased breath sounds in the left upper lung with some wheezing. The heart has regular rhythm and rate. Impression . . . . . . . The patient is tolerating radiation. Plan . . . . . . . . . . . . Continue treatment as planned. He will start chemotherapy next week. Patient's PET scan is next week and depending on results of this study we may alter his radiation fields.  ________________________________   Blair Promise, PhD, MD

## 2014-04-05 NOTE — Addendum Note (Signed)
Encounter addended by: Blair Promise, MD on: 04/05/2014  5:20 PM<BR>     Documentation filed: Notes Section

## 2014-04-05 NOTE — Progress Notes (Signed)
Following injection reports left shoulder pain 4 on a scale of 0-10. Reports he only has pain in his shoulder when he raises his arm related to arthritis.  Next chemo infusion of carbo and taxol scheduled for 04/11/2014. Dry cough noted. Inspiratory and expiratory wheezing noted.

## 2014-04-05 NOTE — Progress Notes (Signed)
  Radiation Oncology         [phone] ________________________________  Name: [name] [name] MRN: [id number]  Date: 04/05/2014  DOB: [birth year]  Simulation Verification Note  Status: outpatient  NARRATIVE: The patient was brought to the treatment unit and placed in the planned treatment position. The clinical setup was verified. Then port films were obtained and uploaded to the radiation oncology medical record software.  The treatment beams were carefully compared against the planned radiation fields. The position location and shape of the radiation fields was reviewed. They targeted volume of tissue appears to be appropriately covered by the radiation beams. Organs at risk appear to be excluded as planned.  Based on my personal review, I approved the simulation verification. The patient's treatment will proceed as planned.  -----------------------------------  Blair Promise, PhD, MD

## 2014-04-05 NOTE — Progress Notes (Signed)
Verbal orders via Dr. [name] for Dilaudid 1mg  IM to be given.  Dilaudid count verified by nurse Gaspar Garbe RN.  Verified Pt name and date of birth.  IM given in left deltoid.  Pt having pain of a 10 when raising Rt arm.

## 2014-04-06 ENCOUNTER — Encounter: Payer: Self-pay | Admitting: Pulmonary Disease

## 2014-04-06 ENCOUNTER — Ambulatory Visit (INDEPENDENT_AMBULATORY_CARE_PROVIDER_SITE_OTHER)
Admission: RE | Admit: 2014-04-06 | Discharge: 2014-04-06 | Disposition: A | Discharge status: Home / Self Care | Payer: Medicare Other | Source: Ambulatory Visit | Attending: Pulmonary Disease | Admitting: Pulmonary Disease

## 2014-04-06 ENCOUNTER — Ambulatory Visit (INDEPENDENT_AMBULATORY_CARE_PROVIDER_SITE_OTHER): Payer: Medicare Other | Admitting: Pulmonary Disease

## 2014-04-06 ENCOUNTER — Ambulatory Visit
Admission: RE | Admit: 2014-04-06 | Discharge: 2014-04-06 | Disposition: A | Discharge status: Home / Self Care | Payer: Medicare Other | Source: Ambulatory Visit | Attending: Radiation Oncology | Admitting: Radiation Oncology

## 2014-04-06 VITALS — BP 120/70 | HR 75 | Ht 71.0 in | Wt 222.0 lb

## 2014-04-06 DIAGNOSIS — Z510 Encounter for antineoplastic radiation therapy: Secondary | ICD-10-CM | POA: Diagnosis not present

## 2014-04-06 DIAGNOSIS — J441 Chronic obstructive pulmonary disease with (acute) exacerbation: Secondary | ICD-10-CM

## 2014-04-06 DIAGNOSIS — C3410 Malignant neoplasm of upper lobe, unspecified bronchus or lung: Secondary | ICD-10-CM

## 2014-04-06 DIAGNOSIS — J449 Chronic obstructive pulmonary disease, unspecified: Secondary | ICD-10-CM

## 2014-04-06 MED ORDER — PREDNISONE 10 MG PO TABS: ORAL_TABLET | ORAL | Status: DC

## 2014-04-06 MED ORDER — IPRATROPIUM-ALBUTEROL 0.5-2.5 (3) MG/3ML IN SOLN: 3.0000 mL | Freq: Three times a day (TID) | RESPIRATORY_TRACT | Status: DC

## 2014-04-06 NOTE — Assessment & Plan Note (Signed)
CXR today Prednisone 10 mg- Take 4 tabs  daily with food x 4 days, then 3 tabs daily x 4 days, then 2 tabs daily x 4 days, then 1 tab daily x4 days then stop. #40 Rx for nebuliser & Duonebs thrice daily

## 2014-04-06 NOTE — Progress Notes (Signed)
   Subjective:    Patient ID: [name] [name], male    DOB: 04/30/1933, 79 y.o.   MRN: [id number]  HPI 80 y/o M, former smoker, with PMH of DM, CAD, HTN, HLD, PAD, GIB (lower), Substance Abuse, Non-Hodgkins Lymphoma & COPD who presented to the [facility] ER on 5/18 with a three week history of increasing shortness of breath and isolated syncopal episode CTA of chest assessed for PE and negative but revealed a LUL obstructing mass with subsequent LUL atelectasis.  Bronchoscopy showed this to be Squamous cell carcinoma of the left lung presenting with left upper lobe bronchial obstruction and collapse Syncope- etio not clear -Echo 11/2013 -mod AS 1.3 cm2, head CT neg  He has started radiation treatment (kinard) and chemotherapy is planned (sherrill) PET and MRI of brain scheduled for 04/12/14.   Chief Complaint  Patient presents with  . Hospitalization Follow-up    Biopsy and pt has treatment with Dr. [name].  Pt reports having sob, wheezing and some cough   He reports increased dyspnea and wheezing No cough or sputum Chest x-ray shows improved aeration in the left lung, although some degree of atelectasis persists in the left upper lobe  Past Medical History  Diagnosis Date  . Peripheral arterial disease   . Diabetes mellitus without complication   . CAD (coronary artery disease)   . Hyperlipidemia   . Hypertension   . Non Hodgkin's lymphoma 2000, 2010  . Peripheral neuropathy   . COPD (chronic obstructive pulmonary disease)   . History of lower GI bleeding   . Substance abuse   . Lung cancer, upper lobe     left lung      Review of Systems  Constitutional: Negative for fever and unexpected weight change.  HENT: Positive for postnasal drip. Negative for congestion, dental problem, ear pain, nosebleeds, rhinorrhea, sinus pressure, sneezing, sore throat and trouble swallowing.   Eyes: Negative for redness and itching.  Respiratory: Positive for cough, chest tightness, shortness of breath  and wheezing.   Cardiovascular: Positive for leg swelling. Negative for palpitations.       Ankles   Gastrointestinal: Negative for nausea and vomiting.  Genitourinary: Negative for dysuria.  Musculoskeletal: Negative for joint swelling.  Skin: Negative for rash.  Neurological: Negative for headaches.  Hematological: Bruises/bleeds easily.  Psychiatric/Behavioral: Positive for behavioral problems. Negative for dysphoric mood. The patient is not nervous/anxious.        Objective:   Physical Exam  Gen. Pleasant, well-nourished, in no distress, normal affect ENT - no lesions, no post nasal drip Neck: No JVD, no thyromegaly, no carotid bruits Lungs: no use of accessory muscles, no dullness to percussion, bilateral coarse rhonchi  Cardiovascular: Rhythm regular, heart sounds  normal, no murmurs or gallops, no peripheral edema Abdomen: soft and non-tender, no hepatosplenomegaly, BS normal. Musculoskeletal: No deformities, no cyanosis or clubbing Neuro:  alert, non focal       Assessment & Plan:

## 2014-04-06 NOTE — Patient Instructions (Signed)
CXR today Prednisone 10 mg- Take 4 tabs  daily with food x 4 days, then 3 tabs daily x 4 days, then 2 tabs daily x 4 days, then 1 tab daily x4 days then stop. #40 Rx for nebuliser & Duonebs thrice daily

## 2014-04-07 ENCOUNTER — Telehealth: Payer: Self-pay | Admitting: *Deleted

## 2014-04-07 ENCOUNTER — Ambulatory Visit
Admission: RE | Admit: 2014-04-07 | Discharge: 2014-04-07 | Disposition: A | Discharge status: Home / Self Care | Payer: Medicare Other | Source: Ambulatory Visit | Attending: Radiation Oncology | Admitting: Radiation Oncology

## 2014-04-07 DIAGNOSIS — C3410 Malignant neoplasm of upper lobe, unspecified bronchus or lung: Secondary | ICD-10-CM

## 2014-04-07 DIAGNOSIS — Z510 Encounter for antineoplastic radiation therapy: Secondary | ICD-10-CM | POA: Diagnosis not present

## 2014-04-07 MED ORDER — RADIAPLEXRX EX GEL
Freq: Once | CUTANEOUS | Status: AC
  Administered 2014-04-07: 16:00:00 via TOPICAL

## 2014-04-07 NOTE — Progress Notes (Signed)
[name] was given the Radiation Therapy and You book and reviewed side effects/management of fatigue, skin changes and throat changes.  He was given radiaplex gel and was instructed to apply it to his left chest twice a day after treatment and at bedtime.  He was educated about under treat day with Dr. [name] on Tuesday's.  He was given my business card and was instructed to call with any questions or concerns.

## 2014-04-07 NOTE — Telephone Encounter (Signed)
Requesting a PAC after attending the chemo class. Understands that he will not be able to get it before his 1st treatment on 04/11/14.  Also requesting script for EMLA cream.

## 2014-04-08 ENCOUNTER — Other Ambulatory Visit: Payer: Self-pay | Admitting: *Deleted

## 2014-04-08 ENCOUNTER — Ambulatory Visit
Admission: RE | Admit: 2014-04-08 | Discharge: 2014-04-08 | Disposition: A | Discharge status: Home / Self Care | Payer: Medicare Other | Source: Ambulatory Visit | Attending: Radiation Oncology | Admitting: Radiation Oncology

## 2014-04-08 DIAGNOSIS — C3410 Malignant neoplasm of upper lobe, unspecified bronchus or lung: Secondary | ICD-10-CM

## 2014-04-08 DIAGNOSIS — Z510 Encounter for antineoplastic radiation therapy: Secondary | ICD-10-CM | POA: Diagnosis not present

## 2014-04-08 DIAGNOSIS — C8590 Non-Hodgkin lymphoma, unspecified, unspecified site: Secondary | ICD-10-CM

## 2014-04-08 MED ORDER — LIDOCAINE-PRILOCAINE 2.5-2.5 % EX CREA: 1.0000 "application " | TOPICAL_CREAM | CUTANEOUS | Status: DC | PRN

## 2014-04-08 MED ORDER — PROCHLORPERAZINE MALEATE 5 MG PO TABS: 5.0000 mg | ORAL_TABLET | Freq: Four times a day (QID) | ORAL | Status: DC | PRN

## 2014-04-08 NOTE — Progress Notes (Signed)
Per Dr. Benay Spice : OK for [facility] and Compazine 5 mg every 6 hours prn nausea. Scripts sent and order for IR placement of PAC entered.

## 2014-04-08 NOTE — Assessment & Plan Note (Signed)
Complete PET scan for staging. Would obtain lung function if after the PET scan he is considered a candidate for surgery

## 2014-04-08 NOTE — Progress Notes (Signed)
Notified by Jonelle Sidle in IR that [name] [name] is scheduled for 04/13/14 at 1300 for 1130 arrival time. Will need to reschedule his radiation appointment that day. In basket message to RT nurse for Dr.Kinard.

## 2014-04-09 LAB — FUNGUS CULTURE W SMEAR: Fungal Smear: NONE SEEN

## 2014-04-10 ENCOUNTER — Emergency Department (HOSPITAL_COMMUNITY): Payer: Medicare Other

## 2014-04-10 ENCOUNTER — Encounter (HOSPITAL_COMMUNITY)
Admission: EM | Disposition: A | Discharge status: Skilled Nursing Facility | Payer: Medicare Other | Source: Home / Self Care | Attending: Internal Medicine

## 2014-04-10 ENCOUNTER — Ambulatory Visit (HOSPITAL_COMMUNITY): Admit: 2014-04-10 | Payer: Self-pay | Admitting: Cardiology

## 2014-04-10 ENCOUNTER — Encounter (HOSPITAL_COMMUNITY): Payer: Self-pay | Admitting: Emergency Medicine

## 2014-04-10 ENCOUNTER — Other Ambulatory Visit: Payer: Self-pay

## 2014-04-10 ENCOUNTER — Inpatient Hospital Stay (HOSPITAL_COMMUNITY)
Admission: EM | Admit: 2014-04-10 | Discharge: 2014-04-16 | DRG: 280 | Disposition: A | Discharge status: Skilled Nursing Facility | Payer: Medicare Other | Attending: Internal Medicine | Admitting: Internal Medicine

## 2014-04-10 ENCOUNTER — Other Ambulatory Visit: Payer: Self-pay | Admitting: Oncology

## 2014-04-10 DIAGNOSIS — I739 Peripheral vascular disease, unspecified: Secondary | ICD-10-CM

## 2014-04-10 DIAGNOSIS — F32A Depression, unspecified: Secondary | ICD-10-CM

## 2014-04-10 DIAGNOSIS — I359 Nonrheumatic aortic valve disorder, unspecified: Secondary | ICD-10-CM

## 2014-04-10 DIAGNOSIS — I447 Left bundle-branch block, unspecified: Secondary | ICD-10-CM | POA: Diagnosis present

## 2014-04-10 DIAGNOSIS — R0600 Dyspnea, unspecified: Secondary | ICD-10-CM

## 2014-04-10 DIAGNOSIS — Z8249 Family history of ischemic heart disease and other diseases of the circulatory system: Secondary | ICD-10-CM

## 2014-04-10 DIAGNOSIS — I129 Hypertensive chronic kidney disease with stage 1 through stage 4 chronic kidney disease, or unspecified chronic kidney disease: Secondary | ICD-10-CM | POA: Diagnosis present

## 2014-04-10 DIAGNOSIS — I714 Abdominal aortic aneurysm, without rupture, unspecified: Secondary | ICD-10-CM

## 2014-04-10 DIAGNOSIS — I249 Acute ischemic heart disease, unspecified: Secondary | ICD-10-CM

## 2014-04-10 DIAGNOSIS — Z794 Long term (current) use of insulin: Secondary | ICD-10-CM

## 2014-04-10 DIAGNOSIS — I219 Acute myocardial infarction, unspecified: Secondary | ICD-10-CM

## 2014-04-10 DIAGNOSIS — C3410 Malignant neoplasm of upper lobe, unspecified bronchus or lung: Secondary | ICD-10-CM

## 2014-04-10 DIAGNOSIS — J9600 Acute respiratory failure, unspecified whether with hypoxia or hypercapnia: Secondary | ICD-10-CM | POA: Diagnosis present

## 2014-04-10 DIAGNOSIS — I10 Essential (primary) hypertension: Secondary | ICD-10-CM

## 2014-04-10 DIAGNOSIS — I2510 Atherosclerotic heart disease of native coronary artery without angina pectoris: Secondary | ICD-10-CM

## 2014-04-10 DIAGNOSIS — J441 Chronic obstructive pulmonary disease with (acute) exacerbation: Secondary | ICD-10-CM

## 2014-04-10 DIAGNOSIS — F329 Major depressive disorder, single episode, unspecified: Secondary | ICD-10-CM | POA: Diagnosis present

## 2014-04-10 DIAGNOSIS — Z7982 Long term (current) use of aspirin: Secondary | ICD-10-CM

## 2014-04-10 DIAGNOSIS — Z79899 Other long term (current) drug therapy: Secondary | ICD-10-CM

## 2014-04-10 DIAGNOSIS — Z87891 Personal history of nicotine dependence: Secondary | ICD-10-CM

## 2014-04-10 DIAGNOSIS — I200 Unstable angina: Secondary | ICD-10-CM

## 2014-04-10 DIAGNOSIS — R55 Syncope and collapse: Secondary | ICD-10-CM

## 2014-04-10 DIAGNOSIS — R918 Other nonspecific abnormal finding of lung field: Secondary | ICD-10-CM

## 2014-04-10 DIAGNOSIS — G609 Hereditary and idiopathic neuropathy, unspecified: Secondary | ICD-10-CM | POA: Diagnosis present

## 2014-04-10 DIAGNOSIS — I5043 Acute on chronic combined systolic (congestive) and diastolic (congestive) heart failure: Secondary | ICD-10-CM | POA: Diagnosis present

## 2014-04-10 DIAGNOSIS — C8589 Other specified types of non-Hodgkin lymphoma, extranodal and solid organ sites: Secondary | ICD-10-CM | POA: Diagnosis present

## 2014-04-10 DIAGNOSIS — Z800 Family history of malignant neoplasm of digestive organs: Secondary | ICD-10-CM

## 2014-04-10 DIAGNOSIS — I214 Non-ST elevation (NSTEMI) myocardial infarction: Principal | ICD-10-CM

## 2014-04-10 DIAGNOSIS — C8590 Non-Hodgkin lymphoma, unspecified, unspecified site: Secondary | ICD-10-CM

## 2014-04-10 DIAGNOSIS — I2584 Coronary atherosclerosis due to calcified coronary lesion: Secondary | ICD-10-CM | POA: Diagnosis present

## 2014-04-10 DIAGNOSIS — I472 Ventricular tachycardia, unspecified: Secondary | ICD-10-CM | POA: Diagnosis present

## 2014-04-10 DIAGNOSIS — I4891 Unspecified atrial fibrillation: Secondary | ICD-10-CM | POA: Diagnosis not present

## 2014-04-10 DIAGNOSIS — F3289 Other specified depressive episodes: Secondary | ICD-10-CM | POA: Diagnosis present

## 2014-04-10 DIAGNOSIS — I509 Heart failure, unspecified: Secondary | ICD-10-CM

## 2014-04-10 DIAGNOSIS — C3490 Malignant neoplasm of unspecified part of unspecified bronchus or lung: Secondary | ICD-10-CM

## 2014-04-10 DIAGNOSIS — Z823 Family history of stroke: Secondary | ICD-10-CM

## 2014-04-10 DIAGNOSIS — I4729 Other ventricular tachycardia: Secondary | ICD-10-CM | POA: Diagnosis present

## 2014-04-10 DIAGNOSIS — R011 Cardiac murmur, unspecified: Secondary | ICD-10-CM

## 2014-04-10 DIAGNOSIS — N189 Chronic kidney disease, unspecified: Secondary | ICD-10-CM | POA: Diagnosis present

## 2014-04-10 DIAGNOSIS — Z66 Do not resuscitate: Secondary | ICD-10-CM | POA: Diagnosis not present

## 2014-04-10 DIAGNOSIS — E119 Type 2 diabetes mellitus without complications: Secondary | ICD-10-CM

## 2014-04-10 DIAGNOSIS — E785 Hyperlipidemia, unspecified: Secondary | ICD-10-CM

## 2014-04-10 DIAGNOSIS — E1149 Type 2 diabetes mellitus with other diabetic neurological complication: Secondary | ICD-10-CM | POA: Diagnosis present

## 2014-04-10 DIAGNOSIS — I350 Nonrheumatic aortic (valve) stenosis: Secondary | ICD-10-CM

## 2014-04-10 HISTORY — DX: Abdominal aortic aneurysm, without rupture: I71.4

## 2014-04-10 HISTORY — DX: Acute myocardial infarction, unspecified: I21.9

## 2014-04-10 HISTORY — PX: LEFT HEART CATHETERIZATION WITH CORONARY ANGIOGRAM: SHX5451

## 2014-04-10 HISTORY — DX: Abdominal aortic aneurysm, without rupture, unspecified: I71.40

## 2014-04-10 LAB — CBC WITH DIFFERENTIAL/PLATELET
BASOS ABS: 0 10*3/uL (ref 0.0–0.1)
Basophils Relative: 0 % (ref 0–1)
EOS ABS: 0.1 10*3/uL (ref 0.0–0.7)
EOS PCT: 1 % (ref 0–5)
HCT: 34.3 % — ABNORMAL LOW (ref 39.0–52.0)
Hemoglobin: 11 g/dL — ABNORMAL LOW (ref 13.0–17.0)
Lymphocytes Relative: 17 % (ref 12–46)
Lymphs Abs: 1.3 10*3/uL (ref 0.7–4.0)
MCH: 28.1 pg (ref 26.0–34.0)
MCHC: 32.1 g/dL (ref 30.0–36.0)
MCV: 87.5 fL (ref 78.0–100.0)
Monocytes Absolute: 0.7 10*3/uL (ref 0.1–1.0)
Monocytes Relative: 9 % (ref 3–12)
Neutro Abs: 5.3 10*3/uL (ref 1.7–7.7)
Neutrophils Relative %: 73 % (ref 43–77)
PLATELETS: 166 10*3/uL (ref 150–400)
RBC: 3.92 MIL/uL — ABNORMAL LOW (ref 4.22–5.81)
RDW: 16.5 % — AB (ref 11.5–15.5)
WBC: 7.3 10*3/uL (ref 4.0–10.5)

## 2014-04-10 LAB — MRSA PCR SCREENING: MRSA by PCR: POSITIVE — AB

## 2014-04-10 LAB — BASIC METABOLIC PANEL
BUN: 12 mg/dL (ref 6–23)
CALCIUM: 9.3 mg/dL (ref 8.4–10.5)
CO2: 28 mEq/L (ref 19–32)
Chloride: 102 mEq/L (ref 96–112)
Creatinine, Ser: 0.75 mg/dL (ref 0.50–1.35)
GFR calc Af Amer: >90 mL/min (ref >90)
GFR, EST NON AFRICAN AMERICAN: 85 mL/min — AB (ref >90)
Glucose, Bld: 298 mg/dL — ABNORMAL HIGH (ref 70–99)
Potassium: 3.3 mEq/L — ABNORMAL LOW (ref 3.7–5.3)
SODIUM: 141 meq/L (ref 137–147)

## 2014-04-10 LAB — PRO B NATRIURETIC PEPTIDE: PRO B NATRI PEPTIDE: 5244 pg/mL — AB (ref 0–450)

## 2014-04-10 LAB — TROPONIN I: TROPONIN I: 0.55 ng/mL — AB (ref <0.30)

## 2014-04-10 SURGERY — LEFT HEART CATHETERIZATION WITH CORONARY ANGIOGRAM
Anesthesia: LOCAL

## 2014-04-10 MED ORDER — OXYCODONE-ACETAMINOPHEN 5-325 MG PO TABS
1.0000 | ORAL_TABLET | ORAL | Status: DC | PRN
  Administered 2014-04-14 – 2014-04-15 (×3): 1 via ORAL
  Filled 2014-04-10 (×8): qty 1

## 2014-04-10 MED ORDER — AMIODARONE IV BOLUS ONLY 150 MG/100ML
150.0000 mg | Freq: Once | INTRAVENOUS | Status: AC
  Administered 2014-04-10: 150 mg via INTRAVENOUS

## 2014-04-10 MED ORDER — DOXYCYCLINE HYCLATE 100 MG PO TABS
100.0000 mg | ORAL_TABLET | Freq: Every day | ORAL | Status: DC
  Administered 2014-04-11 – 2014-04-12 (×2): 100 mg via ORAL
  Filled 2014-04-10 (×3): qty 1

## 2014-04-10 MED ORDER — FUROSEMIDE 10 MG/ML IJ SOLN
60.0000 mg | Freq: Once | INTRAMUSCULAR | Status: AC
  Administered 2014-04-10: 60 mg via INTRAVENOUS
  Filled 2014-04-10: qty 8

## 2014-04-10 MED ORDER — IPRATROPIUM-ALBUTEROL 0.5-2.5 (3) MG/3ML IN SOLN: 3.0000 mL | Freq: Three times a day (TID) | RESPIRATORY_TRACT | Status: DC

## 2014-04-10 MED ORDER — HEPARIN (PORCINE) IN NACL 100-0.45 UNIT/ML-% IJ SOLN
1000.0000 [IU]/h | INTRAMUSCULAR | Status: DC
  Administered 2014-04-11: 1000 [IU]/h via INTRAVENOUS
  Filled 2014-04-10: qty 250

## 2014-04-10 MED ORDER — ALBUTEROL SULFATE (2.5 MG/3ML) 0.083% IN NEBU
5.0000 mg | INHALATION_SOLUTION | Freq: Once | RESPIRATORY_TRACT | Status: AC
  Administered 2014-04-10: 5 mg via RESPIRATORY_TRACT
  Filled 2014-04-10: qty 6

## 2014-04-10 MED ORDER — SODIUM CHLORIDE 0.9 % IV SOLN: 250.0000 mL | INTRAVENOUS | Status: DC | PRN

## 2014-04-10 MED ORDER — TIROFIBAN (AGGRASTAT) BOLUS VIA INFUSION
25.0000 ug/kg | Freq: Once | INTRAVENOUS | Status: AC
  Administered 2014-04-10: 2517.5 ug via INTRAVENOUS
  Filled 2014-04-10: qty 51

## 2014-04-10 MED ORDER — SODIUM CHLORIDE 0.9 % IJ SOLN: 3.0000 mL | INTRAMUSCULAR | Status: DC | PRN

## 2014-04-10 MED ORDER — FINASTERIDE 5 MG PO TABS
5.0000 mg | ORAL_TABLET | Freq: Every day | ORAL | Status: DC
  Administered 2014-04-11 – 2014-04-16 (×6): 5 mg via ORAL
  Filled 2014-04-10 (×7): qty 1

## 2014-04-10 MED ORDER — METHYLPREDNISOLONE SODIUM SUCC 125 MG IJ SOLR
125.0000 mg | Freq: Once | INTRAMUSCULAR | Status: AC
  Administered 2014-04-10: 125 mg via INTRAVENOUS
  Filled 2014-04-10: qty 2

## 2014-04-10 MED ORDER — METOPROLOL TARTRATE 12.5 MG HALF TABLET
12.5000 mg | ORAL_TABLET | Freq: Two times a day (BID) | ORAL | Status: DC
  Administered 2014-04-10 – 2014-04-11 (×2): 12.5 mg via ORAL
  Filled 2014-04-10 (×6): qty 1

## 2014-04-10 MED ORDER — FUROSEMIDE 10 MG/ML IJ SOLN
60.0000 mg | Freq: Once | INTRAMUSCULAR | Status: AC
  Administered 2014-04-10: 60 mg via INTRAVENOUS
  Filled 2014-04-10: qty 6

## 2014-04-10 MED ORDER — MUPIROCIN 2 % EX OINT
1.0000 "application " | TOPICAL_OINTMENT | Freq: Two times a day (BID) | CUTANEOUS | Status: AC
  Administered 2014-04-10 – 2014-04-15 (×10): 1 via NASAL
  Filled 2014-04-10 (×2): qty 22

## 2014-04-10 MED ORDER — INSULIN ASPART 100 UNIT/ML ~~LOC~~ SOLN
0.0000 [IU] | Freq: Three times a day (TID) | SUBCUTANEOUS | Status: DC
  Administered 2014-04-11: 11 [IU] via SUBCUTANEOUS
  Administered 2014-04-11 – 2014-04-12 (×3): 7 [IU] via SUBCUTANEOUS
  Administered 2014-04-12: 11 [IU] via SUBCUTANEOUS
  Administered 2014-04-12: 4 [IU] via SUBCUTANEOUS
  Administered 2014-04-13 (×3): 7 [IU] via SUBCUTANEOUS
  Administered 2014-04-14: 15 [IU] via SUBCUTANEOUS
  Administered 2014-04-14 (×2): 7 [IU] via SUBCUTANEOUS
  Administered 2014-04-15: 4 [IU] via SUBCUTANEOUS
  Administered 2014-04-15: 11 [IU] via SUBCUTANEOUS
  Administered 2014-04-15 – 2014-04-16 (×2): 7 [IU] via SUBCUTANEOUS
  Administered 2014-04-16: 15 [IU] via SUBCUTANEOUS

## 2014-04-10 MED ORDER — TAMSULOSIN HCL 0.4 MG PO CAPS
0.4000 mg | ORAL_CAPSULE | Freq: Every day | ORAL | Status: DC
  Administered 2014-04-11 – 2014-04-16 (×6): 0.4 mg via ORAL
  Filled 2014-04-10 (×7): qty 1

## 2014-04-10 MED ORDER — SODIUM CHLORIDE 0.9 % IV SOLN: 1.0000 mL/kg/h | INTRAVENOUS | Status: AC

## 2014-04-10 MED ORDER — IPRATROPIUM-ALBUTEROL 20-100 MCG/ACT IN AERS: 1.0000 | INHALATION_SPRAY | Freq: Four times a day (QID) | RESPIRATORY_TRACT | Status: DC

## 2014-04-10 MED ORDER — IPRATROPIUM BROMIDE 0.02 % IN SOLN
0.5000 mg | Freq: Once | RESPIRATORY_TRACT | Status: AC
  Administered 2014-04-10: 0.5 mg via RESPIRATORY_TRACT
  Filled 2014-04-10: qty 2.5

## 2014-04-10 MED ORDER — AMIODARONE IV BOLUS ONLY 150 MG/100ML
INTRAVENOUS | Status: AC
  Filled 2014-04-10: qty 100

## 2014-04-10 MED ORDER — FUROSEMIDE 10 MG/ML IJ SOLN
8.0000 mg/h | INTRAVENOUS | Status: DC
  Filled 2014-04-10: qty 25

## 2014-04-10 MED ORDER — DULOXETINE HCL 30 MG PO CPEP
30.0000 mg | ORAL_CAPSULE | Freq: Every day | ORAL | Status: DC
  Administered 2014-04-11 – 2014-04-16 (×6): 30 mg via ORAL
  Filled 2014-04-10 (×7): qty 1

## 2014-04-10 MED ORDER — ASPIRIN 81 MG PO CHEW
81.0000 mg | CHEWABLE_TABLET | Freq: Every day | ORAL | Status: DC
  Administered 2014-04-11 – 2014-04-16 (×6): 81 mg via ORAL
  Filled 2014-04-10 (×6): qty 1

## 2014-04-10 MED ORDER — CHLORHEXIDINE GLUCONATE 0.12 % MT SOLN
15.0000 mL | Freq: Two times a day (BID) | OROMUCOSAL | Status: DC
  Administered 2014-04-10 – 2014-04-16 (×12): 15 mL via OROMUCOSAL
  Filled 2014-04-10 (×14): qty 15

## 2014-04-10 MED ORDER — NITROGLYCERIN 0.4 MG/SPRAY TL SOLN
1.0000 | Status: DC | PRN
  Filled 2014-04-10: qty 4.9

## 2014-04-10 MED ORDER — PROCHLORPERAZINE MALEATE 5 MG PO TABS
5.0000 mg | ORAL_TABLET | Freq: Four times a day (QID) | ORAL | Status: DC | PRN
  Filled 2014-04-10: qty 1

## 2014-04-10 MED ORDER — TIROFIBAN HCL IV 5 MG/100ML
0.1500 ug/kg/min | INTRAVENOUS | Status: AC
  Administered 2014-04-10 – 2014-04-11 (×4): 0.15 ug/kg/min via INTRAVENOUS
  Filled 2014-04-10 (×5): qty 100

## 2014-04-10 MED ORDER — BIOTENE DRY MOUTH MT LIQD
15.0000 mL | Freq: Two times a day (BID) | OROMUCOSAL | Status: DC
  Administered 2014-04-11 – 2014-04-16 (×11): 15 mL via OROMUCOSAL

## 2014-04-10 MED ORDER — INSULIN ASPART 100 UNIT/ML ~~LOC~~ SOLN
0.0000 [IU] | Freq: Every day | SUBCUTANEOUS | Status: DC
  Administered 2014-04-10: 5 [IU] via SUBCUTANEOUS

## 2014-04-10 MED ORDER — GABAPENTIN 600 MG PO TABS
600.0000 mg | ORAL_TABLET | Freq: Three times a day (TID) | ORAL | Status: DC
  Administered 2014-04-10 – 2014-04-12 (×7): 600 mg via ORAL
  Filled 2014-04-10 (×12): qty 1

## 2014-04-10 MED ORDER — IPRATROPIUM-ALBUTEROL 0.5-2.5 (3) MG/3ML IN SOLN
3.0000 mL | Freq: Four times a day (QID) | RESPIRATORY_TRACT | Status: DC
  Administered 2014-04-10 – 2014-04-11 (×4): 3 mL via RESPIRATORY_TRACT
  Filled 2014-04-10 (×5): qty 3

## 2014-04-10 MED ORDER — HEPARIN SODIUM (PORCINE) 5000 UNIT/ML IJ SOLN
4000.0000 [IU] | Freq: Once | INTRAMUSCULAR | Status: AC
  Administered 2014-04-10: 4000 [IU] via INTRAVENOUS

## 2014-04-10 MED ORDER — MORPHINE SULFATE 2 MG/ML IJ SOLN
2.0000 mg | INTRAMUSCULAR | Status: DC | PRN
  Administered 2014-04-14 – 2014-04-15 (×4): 2 mg via INTRAVENOUS
  Filled 2014-04-10 (×4): qty 1

## 2014-04-10 MED ORDER — ASPIRIN 325 MG PO TABS
325.0000 mg | ORAL_TABLET | Freq: Once | ORAL | Status: AC
  Administered 2014-04-10: 325 mg via ORAL
  Filled 2014-04-10: qty 1

## 2014-04-10 MED ORDER — ASPIRIN 81 MG PO TABS
81.0000 mg | ORAL_TABLET | Freq: Two times a day (BID) | ORAL | Status: DC
Start: 2014-04-10 — End: 2014-04-10

## 2014-04-10 MED ORDER — FUROSEMIDE 10 MG/ML IJ SOLN
8.0000 mg/h | INTRAVENOUS | Status: DC
  Administered 2014-04-10: 8 mg/h via INTRAVENOUS
  Filled 2014-04-10 (×2): qty 25

## 2014-04-10 MED ORDER — RADIAPLEXRX EX GEL
1.0000 "application " | Freq: Two times a day (BID) | CUTANEOUS | Status: DC
  Administered 2014-04-14 – 2014-04-15 (×2): 1 via TOPICAL
  Filled 2014-04-10: qty 170

## 2014-04-10 MED ORDER — POTASSIUM CHLORIDE CRYS ER 20 MEQ PO TBCR
40.0000 meq | EXTENDED_RELEASE_TABLET | Freq: Once | ORAL | Status: AC
  Administered 2014-04-10: 40 meq via ORAL
  Filled 2014-04-10: qty 2

## 2014-04-10 MED ORDER — SODIUM CHLORIDE 0.9 % IJ SOLN
3.0000 mL | Freq: Two times a day (BID) | INTRAMUSCULAR | Status: DC
  Administered 2014-04-10 – 2014-04-16 (×10): 3 mL via INTRAVENOUS

## 2014-04-10 MED ORDER — ATORVASTATIN CALCIUM 10 MG PO TABS
10.0000 mg | ORAL_TABLET | Freq: Every day | ORAL | Status: DC
  Administered 2014-04-11 – 2014-04-12 (×2): 10 mg via ORAL
  Filled 2014-04-10 (×3): qty 1

## 2014-04-10 MED ORDER — CHLORHEXIDINE GLUCONATE CLOTH 2 % EX PADS
6.0000 | MEDICATED_PAD | Freq: Every day | CUTANEOUS | Status: AC
  Administered 2014-04-11 – 2014-04-15 (×4): 6 via TOPICAL

## 2014-04-10 NOTE — Progress Notes (Signed)
In to release next 3 mL of air from TR band.  Noted a slightly discolored area to the proximal apsect of the right forearm under the TR band.  No oozing or bleeding from insertion site.  Area slightly boggy to touch.  2 mL of air added to band. Pressure applied for 5 minutes.  No additional color changes or oozing noted.  Will resume deflation in 30 minutes.  Ridley Dileo, [facility]

## 2014-04-10 NOTE — ED Notes (Signed)
Dr Venora Maples made aware of pt critical troponin

## 2014-04-10 NOTE — CV Procedure (Signed)
CARDIAC CATHETERIZATION REPORT  NAME:  [name] [name]   MRN: [id number] DOB:  09/22/1933   ADMIT DATE: 04/10/2014 Procedure Date: 04/10/2014  INTERVENTIONAL CARDIOLOGIST: Leonie Man, M.D., MS PRIMARY CARE PROVIDER: [name], MD PRIMARY CARDIOLOGIST: Sanda Klein, M.D.  PATIENT:  [name] [name] is a 80 y.o. male with now his third diagnosed cancer being squamous cell ung cancer that is nonoperable and noncurable. He has a baseline EKG with borderline left bundle branch block or presented after roughly 24 hours or worsening dyspnea orthopnea and PND. He then started noticing that his pressure. He was taken by EMS to [facility] long emergency room where he was initially being evaluated for acute heart failure, although his most recent echocardiogram and nuclear study were both negative. He did have mild troponin elevation with elevated proBNP, and then during this ER stay, he developed acute onset left-sided chest pain with worsening EKG findings. His EKG note with complete left bundle branch block with ischemic changes. Based on the extent of CHF in the fasting is now having anginal pain, he is referred for urgent non-ST elevation MI cardiac catheterization.  PRE-OPERATIVE DIAGNOSIS:    Non-STEMI  Acute Heart Failure, Likely Combined Systolic and Diastolic  Known Squamous Cell Lung Cancer  Type 2 diabetes, on insulin with complications  History of AAA status post aortobifem bypass  No significant coronary calcification by CT scan  PROCEDURES PERFORMED:    Left Heart Catheterization with Native Coronary Angiography  via Right Radial Artery   Left Ventriculography  PROCEDURE: The patient was brought to the 2nd [facility]iac Catheterization Lab in the fasting state and prepped and draped in the usual sterile fashion for Right Radial artery access. A modified Allen's test was performed on the right wrist demonstrating excellent collateral flow for radial access.   Sterile  technique was used including antiseptics, cap, gloves, gown, hand hygiene, mask and sheet. Skin prep: Chlorhexidine.   Consent: Risks of procedure as well as the alternatives and risks of each were explained to the (patient/caregiver). Consent for procedure obtained.   Time Out: Verified patient identification, verified procedure, site/side was marked, verified correct patient position, special equipment/implants available, medications/allergies/relevent history reviewed, required imaging and test results available. Performed.  Access:   Right Radial Artery: 6 Fr Sheath -  Seldinger Technique (Angiocath Micropuncture Kit)  Radial Cocktail - 10 mL; IV Heparin additional 3000 to the ER bolus of 4000 Units   Left Heart Catheterization: 5 Fr Catheters advanced or exchanged over a Long Exchange Safety J-wire; JL4, JL 3.5, angled pigtail catheter. Right Coronary Artery Cineangiography: JR 4 Catheter  Left Coronary Artery Cineangiography: JL4 Catheter   LV Hemodynamics (LV Gram): Angled Pigtail  Sheath removed in the Cath Lab with a TR band placed or hemostasis.  TR Band: 1450  Hours; 13 mL air  FINDINGS:  Hemodynamics:   Central Aortic Pressure / Mean: 91/67/77 mmHg  Left Ventricular Pressure / LVEDP: 120/70/1 mmHg  Left Ventriculography: Not performed to conserve contrast   Coronary Anatomy: Highly calcified proximal right and left coronary arteries  Dominance: Right   Left Main: 70% ostial stenosis followed by mild ectatic dilation and then distally there is a large calcified border with roughly 80% distal stenosis. It bifurcates distally into the LAD and Circumflex  LAD: Large-caliber vessel with proximal heavy calcification. There is diffuse roughly 50% stenosis with significant bending.  There are 2 diagonal branches one that comes off proximal and one that comes in the mid  vessel after roughly 60% stenosis in the LAD.  D1: Moderate to large-caliber vessel, tortuous and free of  disease.  D2: A smaller moderate caliber vessel, no significant disease.  Left Circumflex: Moderate to large/large-caliber vessel that gives off a small first marginal branch before it goes in the AV groove where it bifurcates into a distal inferolateral OM. There is a small AV groove branch   RCA:  very large caliber, dominant vessel bifurcating distally into the PDA and Right Posterior AV Groove Branch (RPAV).  There is extensive thrombotic irregularities from the early mid vessel throughout the entire mid vessel. There is also another focal thrombotic stenosis just prior to the bifurcation distally.   The downstream vessel has diffuse mild irregularities an extensive RPDA and RPL system.   After reviewing the initial angiography, the culprit lesion was thought to be the diffusely disease & thrombotic mid and distal RCA .   after reviewing the films and discussing with the patient's family and the patient saw, the decision was made to avoid emergent PCI today to allow for time to sublingual discussed with the patient's cardiologist.   MEDICATIONS:  Anesthesia:  Local Lidocaine  2 ml  Premedication:  4000 units IV heparin  Omnipaque Contrast:  90 ml  Anticoagulation:  IV Heparin  3000 Units ; Angiomax Bolus & drip  Anti-Platelet Agent:   None; Aggrastat will be started at the completion the procedure.  PATIENT DISPOSITION:    The patient was transferred to the PACU holding area in a hemodynamicaly stable, chest pain free condition.  The patient tolerated the procedure well, and there were no complications.  EBL:   < 10 ml  The patient was stable before, during, and after the procedure.  POST-OPERATIVE DIAGNOSIS:    Severe multivessel disease with left main disease combining both ostial and distal stenosis of 70 and 90%. This is in conjunction with severe extensive disease in the entire mid to distal RCA and moderate disease in the proximal LAD.  Likely spurious reading in the  LVEDP is a patient was hypotensive from radial cocktail administration prior to pressure readings.  Moderate to marked respiratory failure requiring BiPAP to oxygen. Likely due to acute ischemic episode with severe left main disease and no significant disease in the RCA.  Clearly the culprit for his current episode is the RCA, however this is a very unfavorable lesion/vessel with calcified stenoses followed by ectatic dilations full of thrombus. As well the distal focal lesion flow Promus. There is significant concern for possible dissection with wire advancement.  PLAN OF CARE:  Transfer to CCU and restart BiPAP.  After reviewing the images and his past history with lung cancer this that essentially has entire long "white out" and is history of aortobifem bypass making in the am less than favorable for placement of the IABP versus Impella for protection.  I discussed the case with Dr. Servando Snare from CT surgery who did not feel the patient will be amenable to CABG simply based on the extent of lung cancer.  Acute coronary syndrome: Not sure if this is truly a STEMI or non-STEMI but with mild troponin elevation we will considered a non-STEMI especially in light of the significant RCA disease.   Will with him being an ACS presentation, he will need to monitor for increased heart failure symptoms.  Restart IV heparin to complete 72 hour treatment for now. This will help with diuresis. along with Aggrastat for 18 hours  Aggressive CHF management:   We'll  start Lasix drip after IV Lasix bolus 60 mg. She is diuresed roughly 1500 mL out just prior to and during the cardiac catheterization.  Will not overly hydrate post cath  Avoid hypotension, therefore will not do anymore and medications beyond metoprolol tartrate twice a day.  Continue statin.  Continue BiPAP  If able to stabilize from a CHF standpoint, could consider attempted high-risk PCI of a long segment of the RCA both proximal-mid and  distal. Would need to be a life flat as he was unable to during this procedure.   Combination of now and on curable lung cancer and severe multivessel disease notably Left Main, we'll need to discuss his condition with Dr. Ammie Dalton from hematology oncology as well as his PCP to determine goals of care. I spent at least 20 minutes talking the patient and his family, and after further the does desire to be DO NOT RESUSCITATE/Limited code. He is agreeable to BiPAP CPAP and pressors/antiarrhythmics but does not want chest compressions defibrillation or intubation with mechanical ventilation. With the extent of disease in the nature of the disease, palliative care consult may be warranted in order to determine end-stage and goals of care.   Leonie Man, M.D., M.S. [facility] [address] [place], [place]  [address]  [phone]  04/10/2014 6:28 PM

## 2014-04-10 NOTE — ED Notes (Signed)
Bed: MW41 Expected date:  Expected time:  Means of arrival:  Comments: EMS- 55M, SOB, Hx of Lung CA

## 2014-04-10 NOTE — Progress Notes (Signed)
Trial off BiPAP per patient request. Patient doing well on [place]amy at 4L sats good RR good. No issues at this time. Will continue monitoring

## 2014-04-10 NOTE — ED Provider Notes (Signed)
CSN: [id number]     Arrival date & time 04/10/14  1027 History   First MD Initiated Contact with Patient 04/10/14 1037     Chief Complaint  Patient presents with  . Shortness of Breath      HPI Patient reports increasing shortness of breath over the past 24 hours.  He's had some orthopnea.  He is currently being treated with radiation for lung cancer and is supposed to start chemotherapy tomorrow.  He denies fevers and chills.  No productive cough.  No prior history of congestive heart failure.  He denies unilateral leg swelling.  O2 sats are 90 B. 90% on room air with improvement on 3 L 97%.  Patient denies abdominal pain.  Symptoms are mild to moderate in severity.   Past Medical History  Diagnosis Date  . Peripheral arterial disease   . Diabetes mellitus without complication   . CAD (coronary artery disease)   . Hyperlipidemia   . Hypertension   . Non Hodgkin's lymphoma 2000, 2010  . Peripheral neuropathy   . COPD (chronic obstructive pulmonary disease)   . History of lower GI bleeding   . Substance abuse   . Lung cancer, upper lobe     left lung   Past Surgical History  Procedure Laterality Date  . Fracture surgery  2009    Left ankle  . Pr vein bypass graft,aorto-fem-pop  2005    Right common femoral to BK popliteal BPG  . Pr vein bypass graft,aorto-fem-pop  01-27-05    Revision of Right fem-pop BPG  . Cardiac catheterization  1990's  . Video bronchoscopy Bilateral 03/14/2014    Procedure: VIDEO BRONCHOSCOPY WITH FLUORO;  Surgeon: Rigoberto Noel, MD;  Location: [place];  Service: Cardiopulmonary;  Laterality: Bilateral;   Family History  Problem Relation Age of Onset  . Cancer Mother     Colon  . Heart attack Mother   . Stroke Father   . Hyperlipidemia Father   . Hyperlipidemia Sister   . Hyperlipidemia Brother   . Hyperlipidemia Daughter   . Hypertension Son    History  Substance Use Topics  . Smoking status: Former Smoker -- 1.00 packs/day for 50 years    Types: Cigarettes    Quit date: 10/29/2003  . Smokeless tobacco: Never Used  . Alcohol Use: No    Review of Systems  All other systems reviewed and are negative.     Allergies  Review of patient's allergies indicates no known allergies.  Home Medications   Prior to Admission medications   Medication Sig Start Date End Date Taking? Authorizing Provider  Acetaminophen (TYLENOL ARTHRITIS PAIN PO) Take 650 mg by mouth 2 (two) times daily.    Yes Historical Provider, MD  aspirin 81 MG tablet Take 160 mg by mouth 2 (two) times daily.   Yes Historical Provider, MD  CINNAMON PO Take 1,000 mg by mouth 2 (two) times daily.   Yes Historical Provider, MD  doxycycline (VIBRA-TABS) 100 MG tablet Take 100 mg by mouth daily.  03/23/14  Yes Historical Provider, MD  DULoxetine (CYMBALTA) 30 MG capsule Take 30 mg by mouth daily. 04/27/13  Yes Historical Provider, MD  fenofibrate micronized (LOFIBRA) 200 MG capsule Take 200 mg by mouth daily before breakfast.   Yes Historical Provider, MD  finasteride (PROSCAR) 5 MG tablet Take 5 mg by mouth daily.   Yes Historical Provider, MD  gabapentin (NEURONTIN) 600 MG tablet Take 600 mg by mouth 3 (three) times daily.  Yes Historical Provider, MD  glucosamine-chondroitin 500-400 MG tablet Take 1 tablet by mouth 2 (two) times daily.   Yes Historical Provider, MD  hyaluronate sodium (RADIAPLEXRX) GEL Apply 1 application topically 2 (two) times daily.   Yes Historical Provider, MD  Insulin Glulisine (APIDRA SOLOSTAR) 100 UNIT/ML Solostar Pen Inject 3-7 Units into the skin See admin instructions. Prn over 250   Yes Historical Provider, MD  Insulin Lispro Prot & Lispro (HUMALOG MIX 75/25 KWIKPEN) (75-25) 100 UNIT/ML Kwikpen Inject 10 Units into the skin 2 (two) times daily. 03/23/14  Yes Jessica U Vann, DO  Ipratropium-Albuterol (COMBIVENT) 20-100 MCG/ACT AERS respimat Inhale 1 puff into the lungs every 6 (six) hours. As needed for wheezing- 03/28/14  Yes Ladell Pier,  MD  lidocaine-prilocaine (EMLA) cream Apply 1 application topically as needed. Apply 1-2 hours prior to stick and cover with plastic wrap 04/08/14  Yes Ladell Pier, MD  metFORMIN (GLUCOPHAGE) 1000 MG tablet Take 1,000 mg by mouth 2 (two) times daily with a meal.   Yes Historical Provider, MD  metoprolol tartrate (LOPRESSOR) 25 MG tablet Take 0.5 tablets (12.5 mg total) by mouth 2 (two) times daily. 03/23/14  Yes Geradine Girt, DO  nitroGLYCERIN (NITROLINGUAL) 0.4 MG/SPRAY spray Place 1 spray under the tongue every 5 (five) minutes x 3 doses as needed for chest pain. 11/24/13  Yes Mihai Croitoru, MD  oxyCODONE-acetaminophen (PERCOCET/ROXICET) 5-325 MG per tablet Take 1 tablet by mouth every 4 (four) hours as needed for severe pain. 04/05/14  Yes Blair Promise, MD  prochlorperazine (COMPAZINE) 5 MG tablet Take 1 tablet (5 mg total) by mouth every 6 (six) hours as needed for nausea or vomiting. 04/08/14  Yes Ladell Pier, MD  rosuvastatin (CRESTOR) 10 MG tablet Take 10 mg by mouth at bedtime.    Yes Historical Provider, MD  Tamsulosin HCl (FLOMAX) 0.4 MG CAPS Take 0.4 mg by mouth daily.   Yes Historical Provider, MD  ipratropium-albuterol (DUONEB) 0.5-2.5 (3) MG/3ML SOLN Take 3 mLs by nebulization 3 (three) times daily. 04/06/14   Rigoberto Noel, MD  predniSONE (DELTASONE) 10 MG tablet Take 4 tabs daily w/ food x 4 days, 3 tabs daily x 4 days, 2 tabs daily x 4 days, 1 tab daily x 4 days then stop 04/06/14   Rigoberto Noel, MD   BP 114/68  Pulse 87  Temp(Src) 98 F (36.7 C) (Oral)  Resp 24  SpO2 100% Physical Exam  Nursing note and vitals reviewed. Constitutional: He is oriented to person, place, and time. He appears well-developed and well-nourished.  HENT:  Head: Normocephalic and atraumatic.  Eyes: EOM are normal.  Neck: Normal range of motion.  Cardiovascular: Normal rate, regular rhythm, normal heart sounds and intact distal pulses.   Pulmonary/Chest: Effort normal. No respiratory distress.  He has wheezes.  Abdominal: Soft. He exhibits no distension. There is no tenderness.  Musculoskeletal: Normal range of motion.  Neurological: He is alert and oriented to person, place, and time.  Skin: Skin is warm and dry.  Psychiatric: He has a normal mood and affect. Judgment normal.    ED Course  Procedures (including critical care time)  CRITICAL CARE Performed by: Hoy Morn Total critical care time: 45 Critical care time was exclusive of separately billable procedures and treating other patients. Critical care was necessary to treat or prevent imminent or life-threatening deterioration. Critical care was time spent personally by me on the following activities: development of treatment plan with patient and/or  surrogate as well as nursing, discussions with consultants, evaluation of patient's response to treatment, examination of patient, obtaining history from patient or surrogate, ordering and performing treatments and interventions, ordering and review of laboratory studies, ordering and review of radiographic studies, pulse oximetry and re-evaluation of patient's condition.   Labs Review Labs Reviewed  CBC WITH DIFFERENTIAL - Abnormal; Notable for the following:    RBC 3.92 (*)    Hemoglobin 11.0 (*)    HCT 34.3 (*)    RDW 16.5 (*)    All other components within normal limits  BASIC METABOLIC PANEL  TROPONIN I  PRO B NATRIURETIC PEPTIDE    Imaging Review Dg Chest 2 View  04/10/2014   CLINICAL DATA:  Shortness of breath. History of lung cancer. 04/06/2014  EXAM: CHEST  2 VIEW  COMPARISON:  None.  FINDINGS: Normal heart size. a calcified atherosclerotic disease involves the thoracic aorta. Left perihilar mass and atelectasis of the left upper lobe is again noted. There are small bilateral pleural effusions and interstitial edema consistent with CHF.  IMPRESSION: 1. Mild CHF.   Electronically Signed   By: Kerby Moors M.D.   On: 04/10/2014 11:39   Dg Chest Portable 1  View  04/10/2014   CLINICAL DATA:  Shortness of breath.  EXAM: PORTABLE CHEST - 1 VIEW  COMPARISON:  04/10/2014.  FINDINGS: Cardiomegaly with pulmonary venous congestion and bilateral interstitial prominence noted consistent with congestive heart failure with pulmonary edema. Persistent large left upper lung/perihilar mass noted. Patient has pacer pad over the chest. No pneumothorax. No acute bony abnormality .  IMPRESSION: 1. Congestive heart failure or from interstitial edema. 2. Persistent large left upper lobe/perihilar mass.   Electronically Signed   By: Marcello Moores  Register   On: 04/10/2014 13:15  I personally reviewed the imaging tests through PACS system I reviewed available ER/hospitalization records through the EMR    ECG interpretation 1243pm  Date: 04/10/2014  Rate: 133  Rhythm: Sinus tachycardia  QRS Axis: normal  Intervals: normal  ST/T Wave abnormalities: Anterior ST elevation and inferolateral ST depression  Conduction Disutrbances: none  Narrative Interpretation:   Old EKG Reviewed: changed from prior ecg concerning for ischemic changes   ECG interpretation 1254pm  Date: 04/10/2014  Rate: 107  Rhythm: normal sinus rhythm  QRS Axis: normal  Intervals: normal  ST/T Wave abnormalities: Anterior ST elevation and inferolateral ST depression  Conduction Disutrbances: none  Narrative Interpretation:   Old EKG Reviewed: No significant changes noted      MDM   Final diagnoses:  CHF (congestive heart failure)  Dyspnea  Acute coronary syndrome   Initially the patient had bilateral wheezes and this is treated as possible reactive airway disease.  Patient had no significant improvement after albuterol Atrovent and Solu-Medrol.  Chest x-ray concerning for CHF   The patient will be admitted to the hospital for new onset congestive heart failure in the setting of recent radiation of his anterior chest.  This could represent radiation effect on his heart.  Patient will need IV  Lasix and echocardiogram.  1:23 PM Patient developed acute onset worsening anterior chest tightness and shortness of breath.  He was sitting forward.  He is diaphoretic.  He looked in distress and looks poor.  EKG at that time demonstrates anterior ST elevation and inferolateral ST depression concerning for acute coronary syndrome/ischemic changes.  The patient looks poor and appears to be having some sort of acute coronary event.  I discussed his case with Dr. Ellyn Hack  of cardiology who will evaluate the patient emergently in the heart cath lab at [place].  Patient was given aspirin.  Patient was given 4000 unit heparin bolus.  Initially when I walked in the room and saw the patient who looks poor and had a wide-complex tachycardia on the monitor he was given 150 mg of amiodarone.  Hoy Morn, MD 04/10/14 1325

## 2014-04-10 NOTE — ED Notes (Signed)
Pt from home reports that he started to have more SOB since yesterday. Pt reports that he has had 3 radiation tx and starts chemo tomorrow. Pt is A&O and in NAD. Dr. Venora Maples at bedside

## 2014-04-10 NOTE — ED Notes (Signed)
Pt transferred to [facility] via EMS

## 2014-04-10 NOTE — ED Notes (Signed)
Pt transported to xray 

## 2014-04-10 NOTE — ED Notes (Signed)
Pt from home c/o SOB. Per [facility] EMS, pt is a lung CA pt and is more SOB this am. Pt is not normally on O2 at home. On scene, pt was 90% on RA, pt placed on 3L pt is 97%. Pt has had 4 radiation tx for lung CA, no chemo at this time. Per EMS, pt has numerous scheduled procedures at [place] center this week. Pt is A&O and in NAD

## 2014-04-10 NOTE — Progress Notes (Signed)
ANTICOAGULATION CONSULT NOTE - Initial Consult  Pharmacy Consult for Heparin + Aggrastat Indication: chest pain/ACS  No Known Allergies  Patient Measurements: Height: 5\' 11"  (180.3 cm) (taken 04/06/14) Weight: 222 lb (100.699 kg) (taken 04/06/14) IBW/kg (Calculated) : 75.3 Heparin Dosing Weight: 95.8  Vital Signs: Temp: 98 F (36.7 C) (06/14 1044) Temp src: Oral (06/14 1044) BP: 120/75 mmHg (06/14 1530) Pulse Rate: 100 (06/14 1530)  Labs:  Recent Labs  04/10/14 1145  HGB 11.0*  HCT 34.3*  PLT 166  CREATININE 0.75  TROPONINI 0.55*   Estimated Creatinine Clearance: 90.5 ml/min (by C-G formula based on Cr of 0.75).   Medical History: Past Medical History  Diagnosis Date  . Peripheral arterial disease     s/p Ao Bifem Bypass  . Diabetes mellitus without complication   . CAD (coronary artery disease)     By  CT Scan  . Hyperlipidemia   . Hypertension   . Non Hodgkin's lymphoma 2000, 2010  . Peripheral neuropathy   . COPD (chronic obstructive pulmonary disease)   . History of lower GI bleeding   . Substance abuse   . Lung cancer, upper lobe     left lung  . AAA (abdominal aortic aneurysm)    Assessment: 59 YOM with acute heart failure and NSTEMI post-cath to start IV heparin 6 hours post-sheath removal and aggrastat x18 hours. H/H stable, Plts 224 >>166. SCr 0.75/estCrCl~ 38mL/min. Radial- TR band @ 6:15PM. No bleeding per RN.   Goal of Therapy:  Heparin level 0.3 to 0.5 units/ml Monitor platelets by anticoagulation protocol: Yes   Plan:  1. Aggrastat bolus 53mcg/kg then 0.15 mcg/kg/min x 18 hours.  2. Start IV heparin at 1000 units/hr at 0015 AM.  3. Heparin level in 6 hours post restart.  4. Daily heparin level and CBC  Sloan Leiter, PharmD, BCPS Clinical Pharmacist [phone] 04/10/2014,3:44 PM

## 2014-04-10 NOTE — Brief Op Note (Signed)
   NAME:  [name] [name]   MRN: [id number] DOB:  07-17-1933   ADMIT DATE: 04/10/2014  Brief Cardiac Catheterization Note:  Indication: 1. Non-STEMI 2. Acute Heart Failure - Likely Combined Systolic and Diastolic  Procedures: 1. Left Heart Catheterization with Native Coroanry Angiography via Right Radial Artery access  JR 4 & JL 4 catheters  Medications:  3000 units heparin IV Radial Cocktail: 5 mg Verapamil, 400 mcg NTG, 2 ml 2% Lidocaine in 10 ml NS Dopamine infusion for 30 minutes at 10 mcg/kg/min  Impression:  Severe multivessel CAD -   Extensive thrombotic stenosis throughout the proximal and mid RCA as well as a focal distal RCA thrombotic lesion; these range from 90-50% with poststenotic dilatation/ectasia,   70% ostial and 80% distal left main disease with significant calcification along with proximal LAD long calcified irregular 50% stenosis.  Hemodynamics:   Central AoP: 91/57/71 mmHg  LVP/EDP: 120/7/12 mmHg  Recommendations:  After reviewing the images and the patient's past medical history, he has no options for mechanical support with having had aortobifem bypass. He is currently in acute heart failure requiring BiPAP for oxygenation. I've discussed it with his family as well as the patient, and we decided to use a conservative approach with medical management of his non-STEMI N. heart failure.  DO NOT RESUSCITATE, but okay to use BiPAP/CPAP and pressors but no chest compressions, shocks or intubations.  Or on IV heparin drip along with Aggrastat for 18 hours  IV Lasix bolus with drip  We'll need to put on insulin coverage.  We'll consult [facility] M. for assistance with airway if he becomes difficult to manage   Full note to follow  Leonie Man, M.D., M.S. Interventional Cardiologist   Pager # [phone] 04/10/2014    04/10/2014 3:07 PM

## 2014-04-10 NOTE — ED Notes (Addendum)
Pt c/o sudden onset CP and increased SOB with vtach on monitor. Dr. Venora Maples notified and went to bedside. Pt still showing vtach on monitor but alert and oriented. Pt son at bedside.  Upon further inspection, he is found to have LBBB, and his rhythm is supraventricular.

## 2014-04-10 NOTE — H&P (Addendum)
History and Physical Note:  NAME:  [name] [name]   MRN: [id number] DOB:  Mar 27, 1933   ADMIT DATE: 04/10/2014  PCP: [name], MD Cardiologist: Sanda Klein, MD  04/10/2014 1:30 PM  [name] [name] is a 80 y.o. male with h/o Lung CA & - soon to start Chemo Rx tomorrow -- recent cardiac workup with relatively normal function ( mod AS) & non-ischemic Nuclear ST .  Presented to [facility] ER with ~24 hr worsening dyspnea with orthopnea. proBNP on eval was 5200 with mild troponin elevation.  Sz)2 90% on RA --> 97% on O2 3L.  While in the ER he developed acute onset chest pressure.tightness and more significant dyspnea.  ECG with worsening LBBB findings with lateral ST depressions concerning for ischemic changes.  He also had a short run of NSVT (later noted to be S Tachy with worsened LBBB) given IV Amiodarone bolus & 60mg  IV Lasix + SL NTG.  "Code STEMI" callled - but not true criteria for STEMI - more consistent with Urgent NSTEMI.  With new onset CHF & + Troponin with acute anginal chest pain, the decision was made to transfer the patient to [facility] Cath lab for urgent Cardiac Cath. Upon arrival to the cath lab - on BiPAP, breathing easier & chest pressure improved to ~2-3/10.  Due to difficulty in interpreting ECG findings & confounding co-morbidities he was undergoing detailed evaluation in the ER when he developed the chest pain @ ~ 12:45 PM. Had been at his [place] until yesterday.      Past Medical History  Diagnosis Date  . Peripheral arterial disease   . Diabetes mellitus without complication   . CAD (coronary artery disease)     By  CT Scan  . Hyperlipidemia   . Hypertension   . Non Hodgkin's lymphoma 2000, 2010  . Peripheral neuropathy   . COPD (chronic obstructive pulmonary disease)   . History of lower GI bleeding   . Substance abuse   . Lung cancer, upper lobe     left lung   Past Surgical History  Procedure Laterality Date  . Fracture surgery  2009    Left ankle  . Pr vein bypass  graft,aorto-fem-pop  2005    Right common femoral to BK popliteal BPG  . Pr vein bypass graft,aorto-fem-pop  01-27-05    Revision of Right fem-pop BPG  . Cardiac catheterization  1990's  . Video bronchoscopy Bilateral 03/14/2014    Procedure: VIDEO BRONCHOSCOPY WITH FLUORO;  Surgeon: Rigoberto Noel, MD;  Location: [place];  Service: Cardiopulmonary;  Laterality: Bilateral;  . Nm myoview ltd  11/2013    Lexiscan.  EF 60%, small fixed inferoapical defect - ? artifact  . Transthoracic echocardiogram      FAMHx: Family History  Problem Relation Age of Onset  . Cancer Mother     Colon  . Heart attack Mother   . Stroke Father   . Hyperlipidemia Father   . Hyperlipidemia Sister   . Hyperlipidemia Brother   . Hyperlipidemia Daughter   . Hypertension Son     SOCHx:  reports that he quit smoking about 10 years ago. His smoking use included Cigarettes. He has a 50 pack-year smoking history. He has never used smokeless tobacco. He reports that he does not drink alcohol or use illicit drugs.  ALLERGIES: No Known Allergies  HOME MEDICATIONS: Prescriptions prior to admission  Medication Sig Dispense Refill  . Acetaminophen (TYLENOL ARTHRITIS PAIN PO) Take 650 mg by mouth 2 (  two) times daily.       Marland Kitchen aspirin 81 MG tablet Take 160 mg by mouth 2 (two) times daily.      Marland Kitchen CINNAMON PO Take 1,000 mg by mouth 2 (two) times daily.      Marland Kitchen doxycycline (VIBRA-TABS) 100 MG tablet Take 100 mg by mouth daily.       . DULoxetine (CYMBALTA) 30 MG capsule Take 30 mg by mouth daily.      . fenofibrate micronized (LOFIBRA) 200 MG capsule Take 200 mg by mouth daily before breakfast.      . finasteride (PROSCAR) 5 MG tablet Take 5 mg by mouth daily.      Marland Kitchen gabapentin (NEURONTIN) 600 MG tablet Take 600 mg by mouth 3 (three) times daily.       Marland Kitchen glucosamine-chondroitin 500-400 MG tablet Take 1 tablet by mouth 2 (two) times daily.      . hyaluronate sodium (RADIAPLEXRX) GEL Apply 1 application topically 2 (two)  times daily.      . Insulin Glulisine (APIDRA SOLOSTAR) 100 UNIT/ML Solostar Pen Inject 3-7 Units into the skin See admin instructions. Prn over 250      . Insulin Lispro Prot & Lispro (HUMALOG MIX 75/25 KWIKPEN) (75-25) 100 UNIT/ML Kwikpen Inject 10 Units into the skin 2 (two) times daily.  15 mL  11  . Ipratropium-Albuterol (COMBIVENT) 20-100 MCG/ACT AERS respimat Inhale 1 puff into the lungs every 6 (six) hours. As needed for wheezing-  1 Inhaler  0  . lidocaine-prilocaine (EMLA) cream Apply 1 application topically as needed. Apply 1-2 hours prior to stick and cover with plastic wrap  30 g  5  . metFORMIN (GLUCOPHAGE) 1000 MG tablet Take 1,000 mg by mouth 2 (two) times daily with a meal.      . metoprolol tartrate (LOPRESSOR) 25 MG tablet Take 0.5 tablets (12.5 mg total) by mouth 2 (two) times daily.  30 tablet  0  . nitroGLYCERIN (NITROLINGUAL) 0.4 MG/SPRAY spray Place 1 spray under the tongue every 5 (five) minutes x 3 doses as needed for chest pain.  12 g  12  . oxyCODONE-acetaminophen (PERCOCET/ROXICET) 5-325 MG per tablet Take 1 tablet by mouth every 4 (four) hours as needed for severe pain.  30 tablet  0  . prochlorperazine (COMPAZINE) 5 MG tablet Take 1 tablet (5 mg total) by mouth every 6 (six) hours as needed for nausea or vomiting.  60 tablet  1  . rosuvastatin (CRESTOR) 10 MG tablet Take 10 mg by mouth at bedtime.       . Tamsulosin HCl (FLOMAX) 0.4 MG CAPS Take 0.4 mg by mouth daily.      Marland Kitchen ipratropium-albuterol (DUONEB) 0.5-2.5 (3) MG/3ML SOLN Take 3 mLs by nebulization 3 (three) times daily.  360 mL  6  . predniSONE (DELTASONE) 10 MG tablet Take 4 tabs daily w/ food x 4 days, 3 tabs daily x 4 days, 2 tabs daily x 4 days, 1 tab daily x 4 days then stop  40 tablet  0   ROS - Review of Systems - Negative except Symptoms noted in HPI & otherwise below. Respiratory ROS: positive for - cough, orthopnea, shortness of breath and wheezing negative for - hemoptysis, pleuritic pain or sputum  changes Cardiovascular ROS: positive for - chest pain, dyspnea on exertion, edema, palpitations, paroxysmal nocturnal dyspnea and shortness of breath negative for - loss of consciousness, murmur, paroxysmal nocturnal dyspnea or rapid heart rate  PHYSICAL EXAM:Blood pressure 121/90, pulse 110, temperature  73 F (36.7 C), temperature source Oral, resp. rate 28, height 5\' 11"  (1.803 m), weight 222 lb (100.699 kg), SpO2 100.00%. General appearance: alert, cooperative, moderate distress, moderately obese and increased WOB, Ill appearing Neck: no adenopathy, no carotid bruit, supple, symmetrical, trachea midline and mild JVP elevation Lungs: Diffusely diminished breath sounds bilaterally with wheezing & poor air movement. Heart: distant sounds; N1 S2 & S3 (did not hear S4) + 3/6 SEM @ RUSB; non-displaced PMI Abdomen: soft, non-tender; bowel sounds normal; no masses,  no organomegaly Extremities: edema ~1+, varicose veins noted and venous stasis dermatitis noted Pulses: 2+ and symmetric Neurologic: Mental status: Alert, oriented, thought content appropriate Cranial nerves: normal   Adult ECG Report  Rate: 107 ;  Rhythm: sinus tachycardia with LBBB, Lateral ST depressions (? Ischmemic); LAD  IMPRESSION & PLAN [name] [name] has presented today for surgery, with the diagnosis of Acute Coronary Syndrome & Acute Congestive Heart Failure (at least diastolic if not combined).  The various methods of treatment have been discussed with the patient and family.   Risks / Complications include, but not limited to: Death, MI, CVA/TIA, VF/VT (with defibrillation), Bradycardia (need for temporary pacer placement), contrast induced nephropathy, bleeding / bruising / hematoma / pseudoaneurysm, vascular or coronary injury (with possible emergent CT or Vascular Surgery), adverse medication reactions, infection.     After consideration of risks, benefits and other options for treatment, the patient has consented to  Procedure(s):  LEFT HEART CATHETERIZATION AND CORONARY ANGIOGRAPHY +/- AD [place]   as a surgical intervention.   We will proceed with the planned procedure.  Will take his Lung Cancer condition into consideration.   Will need DM orders for insulin. Will likely need short term BiPap. Need to have [employer].   [facility] [address] [place], [place]  [id number]  [phone]  04/10/2014 1:30 PM

## 2014-04-11 ENCOUNTER — Ambulatory Visit: Payer: Medicare Other

## 2014-04-11 ENCOUNTER — Other Ambulatory Visit: Payer: Self-pay | Admitting: Radiology

## 2014-04-11 DIAGNOSIS — N189 Chronic kidney disease, unspecified: Secondary | ICD-10-CM

## 2014-04-11 DIAGNOSIS — I2510 Atherosclerotic heart disease of native coronary artery without angina pectoris: Secondary | ICD-10-CM

## 2014-04-11 DIAGNOSIS — Z87898 Personal history of other specified conditions: Secondary | ICD-10-CM

## 2014-04-11 DIAGNOSIS — G589 Mononeuropathy, unspecified: Secondary | ICD-10-CM

## 2014-04-11 DIAGNOSIS — J449 Chronic obstructive pulmonary disease, unspecified: Secondary | ICD-10-CM

## 2014-04-11 DIAGNOSIS — I739 Peripheral vascular disease, unspecified: Secondary | ICD-10-CM

## 2014-04-11 DIAGNOSIS — J4489 Other specified chronic obstructive pulmonary disease: Secondary | ICD-10-CM

## 2014-04-11 DIAGNOSIS — I359 Nonrheumatic aortic valve disorder, unspecified: Secondary | ICD-10-CM

## 2014-04-11 LAB — BASIC METABOLIC PANEL
BUN: 16 mg/dL (ref 6–23)
BUN: 18 mg/dL (ref 6–23)
CHLORIDE: 92 meq/L — AB (ref 96–112)
CO2: 35 meq/L — AB (ref 19–32)
CO2: 33 mEq/L — ABNORMAL HIGH (ref 19–32)
Calcium: 9.2 mg/dL (ref 8.4–10.5)
Calcium: 9.3 mg/dL (ref 8.4–10.5)
Chloride: 90 mEq/L — ABNORMAL LOW (ref 96–112)
Creatinine, Ser: 0.91 mg/dL (ref 0.50–1.35)
Creatinine, Ser: 0.97 mg/dL (ref 0.50–1.35)
GFR calc Af Amer: >90 mL/min (ref >90)
GFR calc Af Amer: 89 mL/min — ABNORMAL LOW (ref >90)
GFR calc non Af Amer: 78 mL/min — ABNORMAL LOW (ref >90)
GFR calc non Af Amer: 76 mL/min — ABNORMAL LOW (ref >90)
GLUCOSE: 194 mg/dL — AB (ref 70–99)
Glucose, Bld: 290 mg/dL — ABNORMAL HIGH (ref 70–99)
Potassium: 4.1 mEq/L (ref 3.7–5.3)
Potassium: 3.6 mEq/L — ABNORMAL LOW (ref 3.7–5.3)
Sodium: 140 mEq/L (ref 137–147)
Sodium: 137 mEq/L (ref 137–147)

## 2014-04-11 LAB — GLUCOSE, CAPILLARY
Glucose-Capillary: 349 mg/dL — ABNORMAL HIGH (ref 70–99)
Glucose-Capillary: 262 mg/dL — ABNORMAL HIGH (ref 70–99)
Glucose-Capillary: 216 mg/dL — ABNORMAL HIGH (ref 70–99)
Glucose-Capillary: 215 mg/dL — ABNORMAL HIGH (ref 70–99)

## 2014-04-11 LAB — CBC
HEMATOCRIT: 38.2 % — AB (ref 39.0–52.0)
HEMOGLOBIN: 12.3 g/dL — AB (ref 13.0–17.0)
MCH: 28 pg (ref 26.0–34.0)
MCHC: 32.2 g/dL (ref 30.0–36.0)
MCV: 86.8 fL (ref 78.0–100.0)
Platelets: 189 10*3/uL (ref 150–400)
RBC: 4.4 MIL/uL (ref 4.22–5.81)
RDW: 16.5 % — ABNORMAL HIGH (ref 11.5–15.5)
WBC: 9.3 10*3/uL (ref 4.0–10.5)

## 2014-04-11 LAB — HEMOGLOBIN A1C
Hgb A1c MFr Bld: 9.2 % — ABNORMAL HIGH (ref <5.7)
Mean Plasma Glucose: 217 mg/dL — ABNORMAL HIGH (ref <117)

## 2014-04-11 LAB — HEPARIN LEVEL (UNFRACTIONATED)
HEPARIN UNFRACTIONATED: 0.4 [IU]/mL (ref 0.30–0.70)
Heparin Unfractionated: 0.15 IU/mL — ABNORMAL LOW (ref 0.30–0.70)

## 2014-04-11 MED ORDER — PERFLUTREN LIPID MICROSPHERE
1.0000 mL | INTRAVENOUS | Status: AC | PRN
  Administered 2014-04-11: 2 mL via INTRAVENOUS
  Filled 2014-04-11: qty 10

## 2014-04-11 MED ORDER — POTASSIUM CHLORIDE CRYS ER 20 MEQ PO TBCR
60.0000 meq | EXTENDED_RELEASE_TABLET | Freq: Once | ORAL | Status: AC
  Administered 2014-04-11: 60 meq via ORAL
  Filled 2014-04-11: qty 3

## 2014-04-11 MED ORDER — SODIUM CHLORIDE 0.9 % IV BOLUS (SEPSIS)
250.0000 mL | Freq: Once | INTRAVENOUS | Status: AC
  Administered 2014-04-11: 250 mL via INTRAVENOUS

## 2014-04-11 MED ORDER — MAGNESIUM SULFATE 40 MG/ML IJ SOLN
2.0000 g | Freq: Once | INTRAMUSCULAR | Status: AC
  Administered 2014-04-11: 2 g via INTRAVENOUS
  Filled 2014-04-11: qty 50

## 2014-04-11 MED ORDER — FUROSEMIDE 10 MG/ML IJ SOLN
80.0000 mg | Freq: Two times a day (BID) | INTRAMUSCULAR | Status: DC
  Administered 2014-04-11 – 2014-04-12 (×2): 80 mg via INTRAVENOUS
  Filled 2014-04-11 (×3): qty 8

## 2014-04-11 MED ORDER — LACTATED RINGERS IV BOLUS (SEPSIS): 250.0000 mL | Freq: Once | INTRAVENOUS | Status: DC

## 2014-04-11 MED ORDER — PERFLUTREN LIPID MICROSPHERE
INTRAVENOUS | Status: AC
  Administered 2014-04-11: 2 mL via INTRAVENOUS
  Filled 2014-04-11: qty 10

## 2014-04-11 MED ORDER — HEPARIN (PORCINE) IN NACL 100-0.45 UNIT/ML-% IJ SOLN
1400.0000 [IU]/h | INTRAMUSCULAR | Status: DC
  Administered 2014-04-11 (×2): 1400 [IU]/h via INTRAVENOUS
  Filled 2014-04-11 (×3): qty 250

## 2014-04-11 MED FILL — Lidocaine HCl Local Preservative Free (PF) Inj 1%: INTRAMUSCULAR | Qty: 30 | Status: AC

## 2014-04-11 MED FILL — Heparin Sodium (Porcine) 2 Unit/ML in Sodium Chloride 0.9%: INTRAMUSCULAR | Qty: 500 | Status: AC

## 2014-04-11 MED FILL — Heparin Sodium (Porcine) Inj 1000 Unit/ML: INTRAMUSCULAR | Qty: 10 | Status: AC

## 2014-04-11 MED FILL — Dopamine in Dextrose 5% Inj 3.2 MG/ML: INTRAVENOUS | Qty: 250 | Status: AC

## 2014-04-11 MED FILL — Nitroglycerin IV Soln 200 MCG/ML in D5W: INTRAVENOUS | Qty: 1 | Status: AC

## 2014-04-11 MED FILL — Verapamil HCl IV Soln 2.5 MG/ML: INTRAVENOUS | Qty: 2 | Status: AC

## 2014-04-11 NOTE — Care Management Note (Signed)
    Page 1 of 1   04/11/2014     9:21:41 AM CARE MANAGEMENT NOTE 04/11/2014  Patient:  [name] [name], [name] [name]   Account Number:  [id number]  Date Initiated:  04/11/2014  Documentation initiated by:  Elissa Hefty  Subjective/Objective Assessment:   adm w mi     Action/Plan:   lives alone, pcp dr perini   Anticipated DC Date:     Anticipated DC Plan:           Choice offered to / List presented to:             Status of service:   Medicare Important Message given?   (If response is "NO", the following Medicare IM given date fields will be blank) Date Medicare IM given:   Date Additional Medicare IM given:    Discharge Disposition:    Per UR Regulation:  Reviewed for med. necessity/level of care/duration of stay  If discussed at [place] Stay Meetings, dates discussed:    Comments:

## 2014-04-11 NOTE — Progress Notes (Signed)
Notified Dr Gwynneth Aliment regarding pt bp in the upper 70s on both arms in which pt denied any symptoms.

## 2014-04-11 NOTE — Progress Notes (Signed)
IP PROGRESS NOTE  Subjective:   [name] is well-known to me with a history of lymphoma and non-small cell lung cancer. He began radiation to the left chest last week and is scheduled to begin weekly chemotherapy today.  He was admitted 04/10/2013 with acute onset shortness of breath and chest discomfort. He was noted to have EKG changes and diagnosed with acute congestive heart failure and acute coronary syndrome.  He underwent a cardiac catheterization and was noted to have significant coronary artery disease. He has been managed with medical therapy and reports feeling better.  Objective: Vital signs in last 24 hours: Blood pressure 108/66, pulse 80, temperature 97.5 F (36.4 C), temperature source Oral, resp. rate 14, height 5\' 11"  (1.803 m), weight 198 lb 14.4 oz (90.22 kg), SpO2 98.00%.  Intake/Output from previous day: 06/14 0701 - 06/15 0700 In: 1293.3 [P.O.:550; I.V.:743.3] Out: 8700 [Urine:8700]  Physical Exam:  HEENT: No thrush Lungs: Bilateral wheezing, good air movement bilaterally, no respiratory distress Cardiac: Regular rate and rhythm Abdomen: No hepatomegaly Extremities: No leg edema    Lab Results:  Recent Labs  04/10/14 1145  WBC 7.3  HGB 11.0*  HCT 34.3*  PLT 166    BMET  Recent Labs  04/10/14 1145  NA 141  K 3.3*  CL 102  CO2 28  GLUCOSE 298*  BUN 12  CREATININE 0.75  CALCIUM 9.3    Studies/Results: Dg Chest 2 View  04/10/2014   CLINICAL DATA:  Shortness of breath. History of lung cancer. 04/06/2014  EXAM: CHEST  2 VIEW  COMPARISON:  None.  FINDINGS: Normal heart size. a calcified atherosclerotic disease involves the thoracic aorta. Left perihilar mass and atelectasis of the left upper lobe is again noted. There are small bilateral pleural effusions and interstitial edema consistent with CHF.  IMPRESSION: 1. Mild CHF.   Electronically Signed   By: Kerby Moors M.D.   On: 04/10/2014 11:39   Dg Chest Portable 1 View  04/10/2014    CLINICAL DATA:  Shortness of breath.  EXAM: PORTABLE CHEST - 1 VIEW  COMPARISON:  04/10/2014.  FINDINGS: Cardiomegaly with pulmonary venous congestion and bilateral interstitial prominence noted consistent with congestive heart failure with pulmonary edema. Persistent large left upper lung/perihilar mass noted. Patient has pacer pad over the chest. No pneumothorax. No acute bony abnormality .  IMPRESSION: 1. Congestive heart failure or from interstitial edema. 2. Persistent large left upper lobe/perihilar mass.   Electronically Signed   By: Marcello Moores  Register   On: 04/10/2014 13:15    Medications: I have reviewed the patient's current medications.  Assessment/Plan:  1. 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 admission 04/10/2014 with acute CHF and acute coronary syndrome, status post a cardiac catheterization 04/10/2014, medical therapy planned  3. COPD 4. chronic renal failure 5. Diabetes 6. Neuropathy 7. history of non-Hodgkin's lymphoma   Mr. O'Neal was admitted with CHF and acute coronary syndrome. His clinical status has improved with medical therapy. He was recently diagnosed with non-small cell lung cancer and began radiation to the left chest 04/05/2014. He was scheduled to begin concurrent weekly Taxol/carboplatin chemotherapy today. The lung tumor is unresectable based on location and comorbid conditions.  [name] understands the goal of chemotherapy/radiation is to improve his respiratory status and potentially prolong survival.. This treatment  will not be curative.  I agree with the plan for medical therapy with regard to his coronary artery disease. We will follow him with the cardiology service and plan to resume outpatient chemotherapy/radiation at  discharge.  Please call oncology as needed.   LOS: 1 day   Nikcole Eischeid, Dominica Severin  04/11/2014, 8:38 AM

## 2014-04-11 NOTE — Progress Notes (Signed)
Subjective:  Breathing better. Still on O2 NP. No CP  Objective:  Temp:  [97.5 F (36.4 C)-98 F (36.7 C)] 97.5 F (36.4 C) (06/14 2330) Pulse Rate:  [72-128] 80 (06/15 0700) Resp:  [14-35] 14 (06/15 0700) BP: (99-130)/(47-90) 108/66 mmHg (06/15 0700) SpO2:  [96 %-100 %] 98 % (06/15 0700) Weight:  [198 lb 14.4 oz (90.22 kg)-222 lb (100.699 kg)] 198 lb 14.4 oz (90.22 kg) (06/15 0500) Weight change:   Intake/Output from previous day: 06/14 0701 - 06/15 0700 In: 1293.3 [P.O.:550; I.V.:743.3] Out: 8700 [Urine:8700]  Intake/Output from this shift:    Physical Exam: General appearance: alert and no distress Neck: no adenopathy, no carotid bruit, no JVD, supple, symmetrical, trachea midline and thyroid not enlarged, symmetric, no tenderness/mass/nodules Lungs: Scattered wheezes and rhonchi Heart: regular rate and rhythm, S1, S2 normal, no murmur, click, rub or gallop Extremities: extremities normal, atraumatic, no cyanosis or edema  Lab Results: Results for orders placed during the hospital encounter of 04/10/14 (from the past 48 hour(s))  CBC WITH DIFFERENTIAL     Status: Abnormal   Collection Time    04/10/14 11:45 AM      Result Value Ref Range   WBC 7.3  4.0 - 10.5 K/uL   RBC 3.92 (*) 4.22 - 5.81 MIL/uL   Hemoglobin 11.0 (*) 13.0 - 17.0 g/dL   HCT 34.3 (*) 39.0 - 52.0 %   MCV 87.5  78.0 - 100.0 fL   MCH 28.1  26.0 - 34.0 pg   MCHC 32.1  30.0 - 36.0 g/dL   RDW 16.5 (*) 11.5 - 15.5 %   Platelets 166  150 - 400 K/uL   Neutrophils Relative % 73  43 - 77 %   Neutro Abs 5.3  1.7 - 7.7 K/uL   Lymphocytes Relative 17  12 - 46 %   Lymphs Abs 1.3  0.7 - 4.0 K/uL   Monocytes Relative 9  3 - 12 %   Monocytes Absolute 0.7  0.1 - 1.0 K/uL   Eosinophils Relative 1  0 - 5 %   Eosinophils Absolute 0.1  0.0 - 0.7 K/uL   Basophils Relative 0  0 - 1 %   Basophils Absolute 0.0  0.0 - 0.1 K/uL  BASIC METABOLIC PANEL     Status: Abnormal   Collection Time    04/10/14 11:45 AM        Result Value Ref Range   Sodium 141  137 - 147 mEq/L   Potassium 3.3 (*) 3.7 - 5.3 mEq/L   Chloride 102  96 - 112 mEq/L   CO2 28  19 - 32 mEq/L   Glucose, Bld 298 (*) 70 - 99 mg/dL   BUN 12  6 - 23 mg/dL   Creatinine, Ser 0.75  0.50 - 1.35 mg/dL   Calcium 9.3  8.4 - 10.5 mg/dL   GFR calc non Af Amer 85 (*) >90 mL/min   GFR calc Af Amer >90  >90 mL/min   Comment: (NOTE)     The eGFR has been calculated using the CKD EPI equation.     This calculation has not been validated in all clinical situations.     eGFR's persistently <90 mL/min signify possible Chronic Kidney     Disease.  TROPONIN I     Status: Abnormal   Collection Time    04/10/14 11:45 AM      Result Value Ref Range   Troponin I 0.55 (*) <0.30 ng/mL  Comment:            Due to the release kinetics of cTnI,     a negative result within the first hours     of the onset of symptoms does not rule out     myocardial infarction with certainty.     If myocardial infarction is still suspected,     repeat the test at appropriate intervals.     CRITICAL RESULT CALLED TO, READ BACK BY AND VERIFIED WITH:     Montine Circle [id number] @ 56 BY J SCOTTON  PRO B NATRIURETIC PEPTIDE     Status: Abnormal   Collection Time    04/10/14 11:45 AM      Result Value Ref Range   Pro B Natriuretic peptide (BNP) 5244.0 (*) 0 - 450 pg/mL  MRSA PCR SCREENING     Status: Abnormal   Collection Time    04/10/14  7:45 PM      Result Value Ref Range   MRSA by PCR POSITIVE (*) NEGATIVE   Comment:            The GeneXpert MRSA Assay (FDA     approved for NASAL specimens     only), is one component of a     comprehensive MRSA colonization     surveillance program. It is not     intended to diagnose MRSA     infection nor to guide or     monitor treatment for     MRSA infections.     RESULT CALLED TO, READ BACK BY AND VERIFIED WITHUlis Rias RN [id number] AT 2126 SKEEN,P  GLUCOSE, CAPILLARY     Status: Abnormal   Collection Time     04/10/14 10:07 PM      Result Value Ref Range   Glucose-Capillary 349 (*) 70 - 99 mg/dL    Imaging: Imaging results have been reviewed  Assessment/Plan:   1. Active Problems: 2.   NSTEMI (non-ST elevated myocardial infarction) 3. CHF 4. COPD 5. Lung CA 6. DM  Time Spent Directly with Patient:  30 minutes  Length of Stay:  LOS: 1 day   Pt admitted with NSTEMI, transferred from [facility] with CP and SOB. Neg MPI in past. Enz mildly elevated and BNP 5000. Pt has H/O ABFBG, had radial cath by Dr. Ellyn Hack revealing LM/3VD. Culprit vessel probably RCA. Decision was made to Rx medically and pt made a limited code per Dr. Ellyn Hack (DNI, no CPR , shock, pressors). He was treated with Aggrastat for 18 hrs, IV hep and IV lasix. Great diuresis (>7 liters). Exam notable for scattered wheezes and rhonchi on inhaled bronchodilators. Agree with current care. Will recheck BMTt after receiving KDUR, D/C Aggrastat after 18 hour infusion, change lasix drip IV BID. OOB to chair. Progress to step down tomorrow.  Lorretta Harp 04/11/2014, 8:50 AM

## 2014-04-11 NOTE — Progress Notes (Signed)
ANTICOAGULATION CONSULT NOTE - Follow Up Consult  Pharmacy Consult for Heparin Indication: multivessel disease  No Known Allergies  Patient Measurements: Height: 5\' 11"  (180.3 cm) Weight: 198 lb 14.4 oz (90.22 kg) IBW/kg (Calculated) : 75.3 Heparin Dosing Weight: 90kg  Vital Signs: Temp: 97.9 F (36.6 C) (06/15 2008) Temp src: Oral (06/15 2008) BP: 77/43 mmHg (06/15 2008) Pulse Rate: 93 (06/15 2008)  Labs:  Recent Labs  04/10/14 1145 04/11/14 0850 04/11/14 1225 04/11/14 1950  HGB 11.0* 12.3*  --   --   HCT 34.3* 38.2*  --   --   PLT 166 189  --   --   HEPARINUNFRC  --  0.15*  --  0.40  CREATININE 0.75 0.91 0.97  --   TROPONINI 0.55*  --   --   --     Estimated Creatinine Clearance: 65.8 ml/min (by C-G formula based on Cr of 0.97).   Medications:  Heparin @ 1400 units/hr  Assessment: 81yom with NSTEMI s/p cath found to have multivessel disease. Decision made for medical management. Heparin started post-cath (6/15 @ 0013) with plan to continue for 72 hours. Aggrastat turned off at 1015 this AM. Repeat heparin level is therapeutic at 0.4. RN reports no s/s of bleeding.    Goal of Therapy:  Heparin level 0.3-0.7 units/ml Monitor platelets by anticoagulation protocol: Yes   Plan:  1) Continue heparin at 1400 units/hr 2) F/u confirmatory AM heparin level  3) Monitor CBC and s/s of bleeding  Albertina Parr, PharmD.  Clinical Pharmacist Pager [phone]

## 2014-04-11 NOTE — Progress Notes (Signed)
Echocardiogram 2D Echocardiogram with Definity has been performed.  [name] [name] 04/11/2014, 1:57 PM

## 2014-04-11 NOTE — Progress Notes (Signed)
ANTICOAGULATION CONSULT NOTE - Follow Up Consult  Pharmacy Consult for Heparin Indication: multivessel disease  No Known Allergies  Patient Measurements: Height: 5\' 11"  (180.3 cm) Weight: 198 lb 14.4 oz (90.22 kg) IBW/kg (Calculated) : 75.3 Heparin Dosing Weight: 90kg  Vital Signs: Temp: 98.3 F (36.8 C) (06/15 0800) Temp src: Oral (06/15 0800) BP: 114/67 mmHg (06/15 0800) Pulse Rate: 80 (06/15 0800)  Labs:  Recent Labs  04/10/14 1145 04/11/14 0850  HGB 11.0* 12.3*  HCT 34.3* 38.2*  PLT 166 189  HEPARINUNFRC  --  0.15*  CREATININE 0.75 0.91  TROPONINI 0.55*  --     Estimated Creatinine Clearance: 70.1 ml/min (by C-G formula based on Cr of 0.91).   Medications:  Heparin @ 1000 units/hr  Assessment: 81yom with NSTEMI s/p cath found to have multivessel disease. Decision made for medical management. Heparin started post-cath (6/15 @ 0013) with plan to continue for 72 hours. Aggrastat also running at 0.69mcg/kg/min but to be turned off shortly. Initial heparin level is below goal. CBC is stable. No bleeding reported.  Will increase heparin goal back to 0.3-0.7 since aggrastat will be off.  Goal of Therapy:  Heparin level 0.3-0.7 units/ml Monitor platelets by anticoagulation protocol: Yes   Plan:  1) Increase heparin to 1400 units/hr 2) Check heparin level 8 hours after increase  Deboraha Sprang 04/11/2014,10:10 AM

## 2014-04-11 NOTE — Progress Notes (Signed)
Inpatient Diabetes Program Recommendations  AACE/ADA: New Consensus Statement on Inpatient Glycemic Control (2013)  Target Ranges:  Prepandial:   less than 140 mg/dL      Peak postprandial:   less than 180 mg/dL (1-2 hours)      Critically ill patients:  140 - 180 mg/dL   Inpatient Diabetes Program Recommendations Insulin - Basal: consider starting Lantus 15 units (patient takes premix at home)  Thank you  Raoul Pitch BSN, RN,CDE Inpatient Diabetes Coordinator [phone] (team pager)

## 2014-04-12 ENCOUNTER — Encounter (HOSPITAL_COMMUNITY): Payer: Medicare Other

## 2014-04-12 ENCOUNTER — Encounter: Payer: Medicare Other | Admitting: Radiation Oncology

## 2014-04-12 ENCOUNTER — Ambulatory Visit: Payer: Medicare Other

## 2014-04-12 ENCOUNTER — Telehealth: Payer: Self-pay | Admitting: Oncology

## 2014-04-12 ENCOUNTER — Ambulatory Visit (HOSPITAL_COMMUNITY): Admission: RE | Admit: 2014-04-12 | Payer: Medicare Other | Source: Ambulatory Visit

## 2014-04-12 ENCOUNTER — Encounter (HOSPITAL_COMMUNITY): Payer: Self-pay

## 2014-04-12 ENCOUNTER — Telehealth: Payer: Self-pay | Admitting: *Deleted

## 2014-04-12 DIAGNOSIS — F32A Depression, unspecified: Secondary | ICD-10-CM | POA: Diagnosis present

## 2014-04-12 DIAGNOSIS — R0989 Other specified symptoms and signs involving the circulatory and respiratory systems: Secondary | ICD-10-CM

## 2014-04-12 DIAGNOSIS — R222 Localized swelling, mass and lump, trunk: Secondary | ICD-10-CM

## 2014-04-12 DIAGNOSIS — R0609 Other forms of dyspnea: Secondary | ICD-10-CM

## 2014-04-12 DIAGNOSIS — E785 Hyperlipidemia, unspecified: Secondary | ICD-10-CM

## 2014-04-12 DIAGNOSIS — I214 Non-ST elevation (NSTEMI) myocardial infarction: Secondary | ICD-10-CM

## 2014-04-12 DIAGNOSIS — F3289 Other specified depressive episodes: Secondary | ICD-10-CM

## 2014-04-12 DIAGNOSIS — I714 Abdominal aortic aneurysm, without rupture, unspecified: Secondary | ICD-10-CM

## 2014-04-12 DIAGNOSIS — C8589 Other specified types of non-Hodgkin lymphoma, extranodal and solid organ sites: Secondary | ICD-10-CM

## 2014-04-12 DIAGNOSIS — C3410 Malignant neoplasm of upper lobe, unspecified bronchus or lung: Secondary | ICD-10-CM

## 2014-04-12 DIAGNOSIS — C3490 Malignant neoplasm of unspecified part of unspecified bronchus or lung: Secondary | ICD-10-CM

## 2014-04-12 DIAGNOSIS — F329 Major depressive disorder, single episode, unspecified: Secondary | ICD-10-CM | POA: Diagnosis present

## 2014-04-12 DIAGNOSIS — E119 Type 2 diabetes mellitus without complications: Secondary | ICD-10-CM

## 2014-04-12 DIAGNOSIS — I10 Essential (primary) hypertension: Secondary | ICD-10-CM

## 2014-04-12 DIAGNOSIS — R011 Cardiac murmur, unspecified: Secondary | ICD-10-CM

## 2014-04-12 DIAGNOSIS — J441 Chronic obstructive pulmonary disease with (acute) exacerbation: Secondary | ICD-10-CM

## 2014-04-12 LAB — GLUCOSE, CAPILLARY
Glucose-Capillary: 193 mg/dL — ABNORMAL HIGH (ref 70–99)
Glucose-Capillary: 234 mg/dL — ABNORMAL HIGH (ref 70–99)
Glucose-Capillary: 272 mg/dL — ABNORMAL HIGH (ref 70–99)
Glucose-Capillary: 174 mg/dL — ABNORMAL HIGH (ref 70–99)
Glucose-Capillary: 329 mg/dL — ABNORMAL HIGH (ref 70–99)

## 2014-04-12 LAB — HEPARIN LEVEL (UNFRACTIONATED): Heparin Unfractionated: 0.43 IU/mL (ref 0.30–0.70)

## 2014-04-12 LAB — CBC
HEMATOCRIT: 36.5 % — AB (ref 39.0–52.0)
Hemoglobin: 11.6 g/dL — ABNORMAL LOW (ref 13.0–17.0)
MCH: 27.6 pg (ref 26.0–34.0)
MCHC: 31.8 g/dL (ref 30.0–36.0)
MCV: 86.9 fL (ref 78.0–100.0)
Platelets: 161 10*3/uL (ref 150–400)
RBC: 4.2 MIL/uL — ABNORMAL LOW (ref 4.22–5.81)
RDW: 16.3 % — ABNORMAL HIGH (ref 11.5–15.5)
WBC: 7.2 10*3/uL (ref 4.0–10.5)

## 2014-04-12 LAB — BASIC METABOLIC PANEL
BUN: 28 mg/dL — AB (ref 6–23)
CALCIUM: 8.9 mg/dL (ref 8.4–10.5)
CO2: 30 mEq/L (ref 19–32)
Chloride: 89 mEq/L — ABNORMAL LOW (ref 96–112)
Creatinine, Ser: 1.2 mg/dL (ref 0.50–1.35)
GFR calc Af Amer: 65 mL/min — ABNORMAL LOW (ref >90)
GFR, EST NON AFRICAN AMERICAN: 56 mL/min — AB (ref >90)
GLUCOSE: 234 mg/dL — AB (ref 70–99)
Potassium: 4.6 mEq/L (ref 3.7–5.3)
SODIUM: 132 meq/L — AB (ref 137–147)

## 2014-04-12 LAB — MAGNESIUM: Magnesium: 1.9 mg/dL (ref 1.5–2.5)

## 2014-04-12 MED ORDER — ENOXAPARIN SODIUM 40 MG/0.4ML ~~LOC~~ SOLN
40.0000 mg | SUBCUTANEOUS | Status: DC
  Administered 2014-04-12: 40 mg via SUBCUTANEOUS
  Filled 2014-04-12: qty 0.4

## 2014-04-12 MED ORDER — INSULIN GLARGINE 100 UNIT/ML ~~LOC~~ SOLN
8.0000 [IU] | Freq: Every day | SUBCUTANEOUS | Status: DC
  Administered 2014-04-12: 8 [IU] via SUBCUTANEOUS
  Filled 2014-04-12: qty 0.08

## 2014-04-12 MED ORDER — MAGNESIUM SULFATE 40 MG/ML IJ SOLN
2.0000 g | Freq: Once | INTRAMUSCULAR | Status: AC
  Administered 2014-04-12: 2 g via INTRAVENOUS
  Filled 2014-04-12: qty 50

## 2014-04-12 MED ORDER — METOPROLOL TARTRATE 25 MG PO TABS
25.0000 mg | ORAL_TABLET | Freq: Two times a day (BID) | ORAL | Status: DC
  Administered 2014-04-12: 25 mg via ORAL

## 2014-04-12 MED ORDER — FUROSEMIDE 10 MG/ML IJ SOLN
40.0000 mg | Freq: Two times a day (BID) | INTRAMUSCULAR | Status: DC
Start: 2014-04-12 — End: 2014-04-13
  Administered 2014-04-12: 40 mg via INTRAVENOUS
  Filled 2014-04-12: qty 4

## 2014-04-12 MED ORDER — DEXTROSE 5 % IV SOLN
5.0000 mg/h | INTRAVENOUS | Status: DC
  Administered 2014-04-12: 5 mg/h via INTRAVENOUS
  Filled 2014-04-12: qty 100

## 2014-04-12 MED ORDER — IPRATROPIUM-ALBUTEROL 0.5-2.5 (3) MG/3ML IN SOLN
3.0000 mL | Freq: Three times a day (TID) | RESPIRATORY_TRACT | Status: DC
  Administered 2014-04-12 – 2014-04-16 (×11): 3 mL via RESPIRATORY_TRACT
  Filled 2014-04-12 (×13): qty 3

## 2014-04-12 NOTE — Telephone Encounter (Signed)
Notified Jan, RN that Dr. Benay Spice will be in to see Jeneen Rinks tomorrow between 0630-0700. Please tell family to be there.

## 2014-04-12 NOTE — Progress Notes (Addendum)
Subjective:  No CP/SOB  Objective:  Temp:  [97.7 F (36.5 C)-98.1 F (36.7 C)] 97.9 F (36.6 C) (06/16 0700) Pulse Rate:  [39-102] 86 (06/16 0800) Resp:  [15-30] 15 (06/16 0800) BP: (69-145)/(29-72) 113/52 mmHg (06/16 0800) SpO2:  [92 %-99 %] 99 % (06/16 0800) Weight change:   Intake/Output from previous day: 06/15 0701 - 06/16 0700 In: 1461.2 [P.O.:800; I.V.:361.2; IV Piggyback:300] Out: 1900 [Urine:1900]  Intake/Output from this shift:    Physical Exam: General appearance: alert and no distress Neck: no adenopathy, no carotid bruit, no JVD, supple, symmetrical, trachea midline and thyroid not enlarged, symmetric, no tenderness/mass/nodules Lungs: Wheezes bilaterally Heart: 3/6 apical systolic murmur c/w MR Extremities: extremities normal, atraumatic, no cyanosis or edema  Lab Results: Results for orders placed during the hospital encounter of 04/10/14 (from the past 48 hour(s))  CBC WITH DIFFERENTIAL     Status: Abnormal   Collection Time    04/10/14 11:45 AM      Result Value Ref Range   WBC 7.3  4.0 - 10.5 K/uL   RBC 3.92 (*) 4.22 - 5.81 MIL/uL   Hemoglobin 11.0 (*) 13.0 - 17.0 g/dL   HCT 34.3 (*) 39.0 - 52.0 %   MCV 87.5  78.0 - 100.0 fL   MCH 28.1  26.0 - 34.0 pg   MCHC 32.1  30.0 - 36.0 g/dL   RDW 16.5 (*) 11.5 - 15.5 %   Platelets 166  150 - 400 K/uL   Neutrophils Relative % 73  43 - 77 %   Neutro Abs 5.3  1.7 - 7.7 K/uL   Lymphocytes Relative 17  12 - 46 %   Lymphs Abs 1.3  0.7 - 4.0 K/uL   Monocytes Relative 9  3 - 12 %   Monocytes Absolute 0.7  0.1 - 1.0 K/uL   Eosinophils Relative 1  0 - 5 %   Eosinophils Absolute 0.1  0.0 - 0.7 K/uL   Basophils Relative 0  0 - 1 %   Basophils Absolute 0.0  0.0 - 0.1 K/uL  BASIC METABOLIC PANEL     Status: Abnormal   Collection Time    04/10/14 11:45 AM      Result Value Ref Range   Sodium 141  137 - 147 mEq/L   Potassium 3.3 (*) 3.7 - 5.3 mEq/L   Chloride 102  96 - 112 mEq/L   CO2 28  19 - 32 mEq/L   Glucose, Bld 298 (*) 70 - 99 mg/dL   BUN 12  6 - 23 mg/dL   Creatinine, Ser 0.75  0.50 - 1.35 mg/dL   Calcium 9.3  8.4 - 10.5 mg/dL   GFR calc non Af Amer 85 (*) >90 mL/min   GFR calc Af Amer >90  >90 mL/min   Comment: (NOTE)     The eGFR has been calculated using the CKD EPI equation.     This calculation has not been validated in all clinical situations.     eGFR's persistently <90 mL/min signify possible Chronic Kidney     Disease.  TROPONIN I     Status: Abnormal   Collection Time    04/10/14 11:45 AM      Result Value Ref Range   Troponin I 0.55 (*) <0.30 ng/mL   Comment:            Due to the release kinetics of cTnI,     a negative result within the first hours  of the onset of symptoms does not rule out     myocardial infarction with certainty.     If myocardial infarction is still suspected,     repeat the test at appropriate intervals.     CRITICAL RESULT CALLED TO, READ BACK BY AND VERIFIED WITH:     Montine Circle [id number] @ [id number] BY J SCOTTON  PRO B NATRIURETIC PEPTIDE     Status: Abnormal   Collection Time    04/10/14 11:45 AM      Result Value Ref Range   Pro B Natriuretic peptide (BNP) 5244.0 (*) 0 - 450 pg/mL  HEMOGLOBIN A1C     Status: Abnormal   Collection Time    04/10/14  6:26 PM      Result Value Ref Range   Hemoglobin A1C 9.2 (*) <5.7 %   Comment: (NOTE)                                                                               According to the ADA Clinical Practice Recommendations for 2011, when     HbA1c is used as a screening test:      >=6.5%   Diagnostic of Diabetes Mellitus               (if abnormal result is confirmed)     5.7-6.4%   Increased risk of developing Diabetes Mellitus     References:Diagnosis and Classification of Diabetes Mellitus,Diabetes     BJSE,8315,17(OHYWV 1):S62-S69 and Standards of Medical Care in             Diabetes - 2011,Diabetes Care,2011,34 (Suppl 1):S11-S61.   Mean Plasma Glucose 217 (*) <117 mg/dL   Comment:  Performed at [facility] PCR SCREENING     Status: Abnormal   Collection Time    04/10/14  7:45 PM      Result Value Ref Range   MRSA by PCR POSITIVE (*) NEGATIVE   Comment:            The GeneXpert MRSA Assay (FDA     approved for NASAL specimens     only), is one component of a     comprehensive MRSA colonization     surveillance program. It is not     intended to diagnose MRSA     infection nor to guide or     monitor treatment for     MRSA infections.     RESULT CALLED TO, READ BACK BY AND VERIFIED WITH:     BUNGQUE,C RN [id number] AT 2126 SKEEN,P  GLUCOSE, CAPILLARY     Status: Abnormal   Collection Time    04/10/14 10:07 PM      Result Value Ref Range   Glucose-Capillary 349 (*) 70 - 99 mg/dL  CBC     Status: Abnormal   Collection Time    04/11/14  8:50 AM      Result Value Ref Range   WBC 9.3  4.0 - 10.5 K/uL   RBC 4.40  4.22 - 5.81 MIL/uL   Hemoglobin 12.3 (*) 13.0 - 17.0 g/dL   HCT 38.2 (*) 39.0 - 52.0 %   MCV 86.8  78.0 -  100.0 fL   MCH 28.0  26.0 - 34.0 pg   MCHC 32.2  30.0 - 36.0 g/dL   RDW 16.5 (*) 11.5 - 15.5 %   Platelets 189  150 - 400 K/uL  HEPARIN LEVEL (UNFRACTIONATED)     Status: Abnormal   Collection Time    04/11/14  8:50 AM      Result Value Ref Range   Heparin Unfractionated 0.15 (*) 0.30 - 0.70 IU/mL   Comment:            IF HEPARIN RESULTS ARE BELOW     EXPECTED VALUES, AND PATIENT     DOSAGE HAS BEEN CONFIRMED,     SUGGEST FOLLOW UP TESTING     OF ANTITHROMBIN III LEVELS.  BASIC METABOLIC PANEL     Status: Abnormal   Collection Time    04/11/14  8:50 AM      Result Value Ref Range   Sodium 140  137 - 147 mEq/L   Potassium 4.1  3.7 - 5.3 mEq/L   Chloride 92 (*) 96 - 112 mEq/L   CO2 35 (*) 19 - 32 mEq/L   Glucose, Bld 290 (*) 70 - 99 mg/dL   BUN 16  6 - 23 mg/dL   Creatinine, Ser 0.91  0.50 - 1.35 mg/dL   Calcium 9.2  8.4 - 10.5 mg/dL   GFR calc non Af Amer 78 (*) >90 mL/min   GFR calc Af Amer >90  >90 mL/min   Comment:  (NOTE)     The eGFR has been calculated using the CKD EPI equation.     This calculation has not been validated in all clinical situations.     eGFR's persistently <90 mL/min signify possible Chronic Kidney     Disease.  GLUCOSE, CAPILLARY     Status: Abnormal   Collection Time    04/11/14  8:57 AM      Result Value Ref Range   Glucose-Capillary 262 (*) 70 - 99 mg/dL  BASIC METABOLIC PANEL     Status: Abnormal   Collection Time    04/11/14 12:25 PM      Result Value Ref Range   Sodium 137  137 - 147 mEq/L   Potassium 3.6 (*) 3.7 - 5.3 mEq/L   Chloride 90 (*) 96 - 112 mEq/L   CO2 33 (*) 19 - 32 mEq/L   Glucose, Bld 194 (*) 70 - 99 mg/dL   BUN 18  6 - 23 mg/dL   Creatinine, Ser 0.97  0.50 - 1.35 mg/dL   Calcium 9.3  8.4 - 10.5 mg/dL   GFR calc non Af Amer 76 (*) >90 mL/min   GFR calc Af Amer 89 (*) >90 mL/min   Comment: (NOTE)     The eGFR has been calculated using the CKD EPI equation.     This calculation has not been validated in all clinical situations.     eGFR's persistently <90 mL/min signify possible Chronic Kidney     Disease.  GLUCOSE, CAPILLARY     Status: Abnormal   Collection Time    04/11/14 12:27 PM      Result Value Ref Range   Glucose-Capillary 216 (*) 70 - 99 mg/dL  GLUCOSE, CAPILLARY     Status: Abnormal   Collection Time    04/11/14  6:03 PM      Result Value Ref Range   Glucose-Capillary 215 (*) 70 - 99 mg/dL  HEPARIN LEVEL (UNFRACTIONATED)     Status: None  Collection Time    04/11/14  7:50 PM      Result Value Ref Range   Heparin Unfractionated 0.40  0.30 - 0.70 IU/mL   Comment:            IF HEPARIN RESULTS ARE BELOW     EXPECTED VALUES, AND PATIENT     DOSAGE HAS BEEN CONFIRMED,     SUGGEST FOLLOW UP TESTING     OF ANTITHROMBIN III LEVELS.  GLUCOSE, CAPILLARY     Status: Abnormal   Collection Time    04/11/14  9:39 PM      Result Value Ref Range   Glucose-Capillary 193 (*) 70 - 99 mg/dL  CBC     Status: Abnormal   Collection Time     04/12/14  3:12 AM      Result Value Ref Range   WBC 7.2  4.0 - 10.5 K/uL   RBC 4.20 (*) 4.22 - 5.81 MIL/uL   Hemoglobin 11.6 (*) 13.0 - 17.0 g/dL   HCT 36.5 (*) 39.0 - 52.0 %   MCV 86.9  78.0 - 100.0 fL   MCH 27.6  26.0 - 34.0 pg   MCHC 31.8  30.0 - 36.0 g/dL   RDW 16.3 (*) 11.5 - 15.5 %   Platelets 161  150 - 400 K/uL  HEPARIN LEVEL (UNFRACTIONATED)     Status: None   Collection Time    04/12/14  3:12 AM      Result Value Ref Range   Heparin Unfractionated 0.43  0.30 - 0.70 IU/mL   Comment:            IF HEPARIN RESULTS ARE BELOW     EXPECTED VALUES, AND PATIENT     DOSAGE HAS BEEN CONFIRMED,     SUGGEST FOLLOW UP TESTING     OF ANTITHROMBIN III LEVELS.  BASIC METABOLIC PANEL     Status: Abnormal   Collection Time    04/12/14  3:12 AM      Result Value Ref Range   Sodium 132 (*) 137 - 147 mEq/L   Potassium 4.6  3.7 - 5.3 mEq/L   Comment: DELTA CHECK NOTED   Chloride 89 (*) 96 - 112 mEq/L   CO2 30  19 - 32 mEq/L   Glucose, Bld 234 (*) 70 - 99 mg/dL   BUN 28 (*) 6 - 23 mg/dL   Creatinine, Ser 1.20  0.50 - 1.35 mg/dL   Calcium 8.9  8.4 - 10.5 mg/dL   GFR calc non Af Amer 56 (*) >90 mL/min   GFR calc Af Amer 65 (*) >90 mL/min   Comment: (NOTE)     The eGFR has been calculated using the CKD EPI equation.     This calculation has not been validated in all clinical situations.     eGFR's persistently <90 mL/min signify possible Chronic Kidney     Disease.  MAGNESIUM     Status: None   Collection Time    04/12/14  3:12 AM      Result Value Ref Range   Magnesium 1.9  1.5 - 2.5 mg/dL  GLUCOSE, CAPILLARY     Status: Abnormal   Collection Time    04/12/14  7:45 AM      Result Value Ref Range   Glucose-Capillary 234 (*) 70 - 99 mg/dL    Imaging: Imaging results have been reviewed  Assessment/Plan:   1. Active Problems: 2.   NSTEMI (non-ST elevated myocardial infarction) 3. Preserved EF,  Diastolic Dysfunction  4. Lung CA 5. COPD  Time Spent Directly with  Patient:  20 minutes  Length of Stay:  LOS: 2 days   S/P NSTEMI with LM/3VD Rx medically. Culprit vessel RCA.Preserved LF fxn and DD. Getting lasix 80 mg IV BID with good UOP. Neg > 7 liters. NSR. BP ok. Will increase BB to 25 mg PO BID.Replete Mg. D/C IV hep and change to DVT prophylaxis.  Labs otherwise OK OK to Tx to [facility] on [address]. Discussed with Dr. Benay Spice. Needs Onco follow up.   Lorretta Harp 04/12/2014, 8:51 AM

## 2014-04-12 NOTE — Telephone Encounter (Signed)
Agnes Lawrence, RN to let her know that Donne Baley will not have radiation today per Dr. [name].  Raquel Sarna verbalized agreement and will notify [name].

## 2014-04-12 NOTE — Progress Notes (Signed)
ANTICOAGULATION CONSULT NOTE - Follow Up Consult  Pharmacy Consult for Heparin Indication: multivessel disease  No Known Allergies  Patient Measurements: Height: 5\' 11"  (180.3 cm) Weight: 198 lb 14.4 oz (90.22 kg) IBW/kg (Calculated) : 75.3 Heparin Dosing Weight: 90kg  Vital Signs: Temp: 97.9 F (36.6 C) (06/16 0700) Temp src: Oral (06/16 0700) BP: 113/52 mmHg (06/16 0800) Pulse Rate: 86 (06/16 0800)  Labs:  Recent Labs  04/10/14 1145 04/11/14 0850 04/11/14 1225 04/11/14 1950 04/12/14 0312  HGB 11.0* 12.3*  --   --  11.6*  HCT 34.3* 38.2*  --   --  36.5*  PLT 166 189  --   --  161  HEPARINUNFRC  --  0.15*  --  0.40 0.43  CREATININE 0.75 0.91 0.97  --  1.20  TROPONINI 0.55*  --   --   --   --     Estimated Creatinine Clearance: 53.2 ml/min (by C-G formula based on Cr of 1.2).   Medications:  Heparin @ 1400 units/hr  Assessment: 81yom with NSTEMI s/p cath found to have multivessel disease. Decision made for medical management. Heparin started post-cath (6/15 @ 0013) with plan to continue for 72 hours. Aggrastat turned off at 1015 this AM. Heparin level therapeutic at 0.4, CBC stable, no s/s of bleeding.    Goal of Therapy:  Heparin level 0.3-0.7 units/ml Monitor platelets by anticoagulation protocol: Yes   Plan:  1) Continue heparin at 1400 units/hr 2) CBC, HL daily  3) Monitor CBC and s/s of bleeding  Albertina Parr, PharmD.  Clinical Pharmacist Pager [phone]

## 2014-04-12 NOTE — Telephone Encounter (Signed)
Noted he is inpatient at [facility] cancel [facility] placement. Call when needed.

## 2014-04-12 NOTE — Clinical Documentation Improvement (Signed)
Please specify clarification of  respiratory failure.  Acute Respiratory Failure Acute on Chronic Respiratory Failure Chronic Respiratory Failure Acute Respiratory Insufficiency Acute Respiratory Insufficiency following surgery or trauma Other Condition Cannot Clinically Determine   Supporting Information: Risk Factors: Lung Cancer, NSTEMI, CHF  Signs & Symptoms: "Moderate to marked respiratory failure requiring BiPAP to oxygen. Likely due to acute ischemic episode with severe left main disease and no significant disease in the RCA."  Diagnostics: Treatment: BIPAP, aggressively treat CHF  Thank You, Philippa Chester ,RN Clinical Documentation Specialist:  [facility] [employer]

## 2014-04-12 NOTE — Telephone Encounter (Signed)
VM from ICU nurse, Jan that patient is in #[id number]. Family is present and has multiple questions about his cancer diagnosis, chemo, radiation. Requesting to speak with MD.

## 2014-04-12 NOTE — Progress Notes (Addendum)
Report called to receiving Jan, RN at [facility].  Patient will be transported from [place] to [place] via [place].  All questions answered.  [facility] arrived at 1220 to transport patient.    Doran Clay, RN

## 2014-04-12 NOTE — H&P (Signed)
[facility] History and Physical  [name] [name] STM:[id number] DOB: August 31, 1933 DOA: 04/10/2014  Referring physician:  PCP: [name], MD  Specialists:   Chief Complaint: Squamous cell carcinoma of the left lung  HPI: [name] [name] is a 80 y.o.WM PMHx  diabetes type 2, CAD, HTN, HLD, PAD, GIB (lower), substance abuse, Non-Hodgkins Lymphoma (2000, 2010) & COPD and Squamous cell carcinoma of the left lung presenting with left upper lobe bronchial obstruction and collapse 04/06/2014. S/P radiation treatment by Dr. [name]. Initially scheduled to start chemotherapy by Dr. Donneta Romberg (oncology). Recent cardiac workup with relatively normal function ( mod AS) & non-ischemic Nuclear ST . Presented to [facility] ER with ~24 hr worsening dyspnea with orthopnea. proBNP on eval was 5200 with mild troponin elevation. Sz)2 90% on RA --> 97% on O2 3L. While in the ER he developed acute onset chest pressure.tightness and more significant dyspnea. ECG with worsening LBBB findings with lateral ST depressions concerning for ischemic changes. He also had a short run of NSVT (later noted to be S Tachy with worsened LBBB) given IV Amiodarone bolus & 60mg  IV Lasix + SL NTG.  "Code STEMI" callled - but not true criteria for STEMI - more consistent with Urgent NSTEMI. With new onset CHF & + Troponin with acute anginal chest pain, the decision was made to transfer the patient to [facility] Cath lab for urgent Cardiac Cath on 6/14.  Upon arrival to the cath lab - on BiPAP, breathing easier & chest pressure improved to ~2-3/10.  Due to difficulty in interpreting ECG findings & confounding co-morbidities he was undergoing detailed evaluation in the ER when he developed the chest pain @ ~ 12:45 PM.  On 6/14 underwent Left Heart Catheterization with Native Coroanry Angiography via Right Radial Artery access; multivessel CAD was diagnosed (see results below), and conservative management was decided upon by family and cardiac team. Patient  transferred to [facility] long for additional oncology workup/definitive treatment.   Review of Systems: The patient denies anorexia, fever, weight loss,, vision loss, decreased hearing, hoarseness, , peripheral edema, balance deficits, hemoptysis, abdominal pain, melena, hematochezia, severe indigestion/heartburn, hematuria, incontinence, genital sores, muscle weakness, suspicious skin lesions, transient blindness,depression, unusual weight change, abnormal bleeding, enlarged lymph nodes, angioedema, and breast masses.    TRAVEL HISTORY: None    Consultants:  Dr. Donneta Romberg (oncology) Dr. [name]. (IR) Dr. Glenetta Hew (cardiology)   Procedure/Significant Events:  6/14 underwent Left Heart Catheterization with Native Coroanry Angiography via Right Radial Artery access -Severe multivessel CAD -  Extensive thrombotic stenosis throughout the proximal and mid RCA as well as a focal distal RCA thrombotic lesion; these range from 90-50% with poststenotic dilatation/ectasia,  70% ostial and 80% distal left main disease with significant calcification along with proximal LAD long calcified irregular 50% stenosis    Culture  NA    Antibiotics:  NA   DVT prophylaxis:  Heparin per pharmacy  Devices  NA   LINES / TUBES:  6/14 22 ga right forearm 6/14 20ga left hand    Past Medical History  Diagnosis Date  . Peripheral arterial disease     s/p Ao Bifem Bypass  . Diabetes mellitus without complication   . CAD (coronary artery disease)     By  CT Scan  . Hyperlipidemia   . Hypertension   . Non Hodgkin's lymphoma 2000, 2010  . Peripheral neuropathy   . COPD (chronic obstructive pulmonary disease)   . History of lower GI bleeding   . Substance  abuse   . Lung cancer, upper lobe     left lung  . AAA (abdominal aortic aneurysm)    Past Surgical History  Procedure Laterality Date  . Fracture surgery  2009    Left ankle  . Pr vein bypass graft,aorto-fem-pop  2005    Right  common femoral to BK popliteal BPG  . Pr vein bypass graft,aorto-fem-pop  01-27-05    Revision of Right fem-pop BPG  . Cardiac catheterization  1990's  . Video bronchoscopy Bilateral 03/14/2014    Procedure: VIDEO BRONCHOSCOPY WITH FLUORO;  Surgeon: Rigoberto Noel, MD;  Location: [facility];  Service: Cardiopulmonary;  Laterality: Bilateral;  . Nm myoview ltd  11/2013    Lexiscan.  EF 60%, small fixed inferoapical defect - ? artifact  . Transthoracic echocardiogram  11/2013    EF 60-65%, Gr 1 DD, Mod AS, Sever MAC - no MS.      Social History:  reports that he quit smoking about 10 years ago. His smoking use included Cigarettes. He has a 50 pack-year smoking history. He has never used smokeless tobacco. He reports that he does not drink alcohol or use illicit drugs.  No Known Allergies  Family History  Problem Relation Age of Onset  . Cancer Mother     Colon  . Heart attack Mother   . Stroke Father   . Hyperlipidemia Father   . Hyperlipidemia Sister   . Hyperlipidemia Brother   . Hyperlipidemia Daughter   . Hypertension Son     Prior to Admission medications   Medication Sig Start Date End Date Taking? Authorizing Anai Lipson  Acetaminophen (TYLENOL ARTHRITIS PAIN PO) Take 650 mg by mouth 2 (two) times daily.    Yes Historical Mareesa Gathright, MD  aspirin 81 MG tablet Take 160 mg by mouth 2 (two) times daily.   Yes Historical Delvin Hedeen, MD  CINNAMON PO Take 1,000 mg by mouth 2 (two) times daily.   Yes Historical Anberlyn Feimster, MD  doxycycline (VIBRA-TABS) 100 MG tablet Take 100 mg by mouth daily.  03/23/14  Yes Historical Merritt Kibby, MD  DULoxetine (CYMBALTA) 30 MG capsule Take 30 mg by mouth daily. 04/27/13  Yes Historical Shaconda Hajduk, MD  fenofibrate micronized (LOFIBRA) 200 MG capsule Take 200 mg by mouth daily before breakfast.   Yes Historical Kymberli Wiegand, MD  finasteride (PROSCAR) 5 MG tablet Take 5 mg by mouth daily.   Yes Historical Janifer Gieselman, MD  gabapentin (NEURONTIN) 600 MG tablet Take 600 mg by  mouth 3 (three) times daily.    Yes Historical Sailor Haughn, MD  glucosamine-chondroitin 500-400 MG tablet Take 1 tablet by mouth 2 (two) times daily.   Yes Historical Alannah Averhart, MD  hyaluronate sodium (RADIAPLEXRX) GEL Apply 1 application topically 2 (two) times daily.   Yes Historical Reilley Latorre, MD  Insulin Glulisine (APIDRA SOLOSTAR) 100 UNIT/ML Solostar Pen Inject 3-7 Units into the skin See admin instructions. Prn over 250   Yes Historical Jamall Strohmeier, MD  Insulin Lispro Prot & Lispro (HUMALOG MIX 75/25 KWIKPEN) (75-25) 100 UNIT/ML Kwikpen Inject 10 Units into the skin 2 (two) times daily. 03/23/14  Yes Jessica U Vann, DO  Ipratropium-Albuterol (COMBIVENT) 20-100 MCG/ACT AERS respimat Inhale 1 puff into the lungs every 6 (six) hours. As needed for wheezing- 03/28/14  Yes Ladell Pier, MD  lidocaine-prilocaine (EMLA) cream Apply 1 application topically as needed. Apply 1-2 hours prior to stick and cover with plastic wrap 04/08/14  Yes Ladell Pier, MD  metFORMIN (GLUCOPHAGE) 1000 MG tablet Take 1,000 mg by mouth  2 (two) times daily with a meal.   Yes Historical Kemon Devincenzi, MD  metoprolol tartrate (LOPRESSOR) 25 MG tablet Take 0.5 tablets (12.5 mg total) by mouth 2 (two) times daily. 03/23/14  Yes Geradine Girt, DO  nitroGLYCERIN (NITROLINGUAL) 0.4 MG/SPRAY spray Place 1 spray under the tongue every 5 (five) minutes x 3 doses as needed for chest pain. 11/24/13  Yes Mihai Croitoru, MD  oxyCODONE-acetaminophen (PERCOCET/ROXICET) 5-325 MG per tablet Take 1 tablet by mouth every 4 (four) hours as needed for severe pain. 04/05/14  Yes Blair Promise, MD  prochlorperazine (COMPAZINE) 5 MG tablet Take 1 tablet (5 mg total) by mouth every 6 (six) hours as needed for nausea or vomiting. 04/08/14  Yes Ladell Pier, MD  rosuvastatin (CRESTOR) 10 MG tablet Take 10 mg by mouth at bedtime.    Yes Historical Deborahann Poteat, MD  Tamsulosin HCl (FLOMAX) 0.4 MG CAPS Take 0.4 mg by mouth daily.   Yes Historical Symiah Nowotny, MD   ipratropium-albuterol (DUONEB) 0.5-2.5 (3) MG/3ML SOLN Take 3 mLs by nebulization 3 (three) times daily. 04/06/14   Rigoberto Noel, MD  predniSONE (DELTASONE) 10 MG tablet Take 4 tabs daily w/ food x 4 days, 3 tabs daily x 4 days, 2 tabs daily x 4 days, 1 tab daily x 4 days then stop 04/06/14   Rigoberto Noel, MD   Physical Exam: Filed Vitals:   04/12/14 1431 04/12/14 1500 04/12/14 1600 04/12/14 1800  BP: 87/46   102/62  Pulse: 77 79    Temp: 98.2 F (36.8 C)  97.6 F (36.4 C)   TempSrc:   Oral   Resp:  19    Height:      Weight:      SpO2: 16% 94%       General: A./O. x4, NAD,  Neck: Negative JVD, negative lymphadenopathy  Cardiovascular: Irregular irregular rhythm and rate, negative murmurs rubs or gallops, normal S1-S2  Respiratory: LLL severe expiratory wheeze, mild expiratory wheeze remainder of lung.  Abdomen: Soft, nontender, nondistended, plus possible  Musculoskeletal: Negative pedal edema, negative cyanosis  Neurologic: Intact  Labs on Admission:  Basic Metabolic Panel:  Recent Labs Lab 04/10/14 1145 04/11/14 0850 04/11/14 1225 04/12/14 0312  NA 141 140 137 132*  K 3.3* 4.1 3.6* 4.6  CL 102 92* 90* 89*  CO2 28 35* 33* 30  GLUCOSE 298* 290* 194* 234*  BUN 12 16 18  28*  CREATININE 0.75 0.91 0.97 1.20  CALCIUM 9.3 9.2 9.3 8.9  MG  --   --   --  1.9   Liver Function Tests: No results found for this basename: AST, ALT, ALKPHOS, BILITOT, PROT, ALBUMIN,  in the last 168 hours No results found for this basename: LIPASE, AMYLASE,  in the last 168 hours No results found for this basename: AMMONIA,  in the last 168 hours CBC:  Recent Labs Lab 04/10/14 1145 04/11/14 0850 04/12/14 0312  WBC 7.3 9.3 7.2  NEUTROABS 5.3  --   --   HGB 11.0* 12.3* 11.6*  HCT 34.3* 38.2* 36.5*  MCV 87.5 86.8 86.9  PLT 166 189 161   Cardiac Enzymes:  Recent Labs Lab 04/10/14 1145  TROPONINI 0.55*    BNP (last 3 results)  Recent Labs  04/10/14 1145  PROBNP  5244.0*   CBG:  Recent Labs Lab 04/11/14 1803 04/11/14 2139 04/12/14 0745 04/12/14 1129 04/12/14 1631  GLUCAP 215* 193* 234* 272* 174*    Radiological Exams on Admission: No results found.  EKG: Pending   Assessment/Plan Principal Problem:   Squamous cell carcinoma lung Active Problems:   DIABETES MELLITUS, TYPE II, ON INSULIN   HYPERLIPIDEMIA   HYPERTENSION   CAD   PAD (peripheral artery disease)   AAA (abdominal aortic aneurysm) without rupture   COPD with exacerbation   Non Hodgkin's lymphoma   Lung mass   Lung cancer, upper lobe   NSTEMI (non-ST elevated myocardial infarction)   Non-Hodgkin's lymphoma -Stable  Squamous cell carcinoma lung -Treatment per Dr. Donneta Romberg (oncology) -Hold further Anticoagulation for placement of Port in a.m.? -NPO after midnight  NSTEMI - Per Dr. Glenetta Hew (cardiology); Medical Management -Start Cardizem gtt for A-Fib RVR  -Continue 81 mg daily  A. fib RVR -Initiate Cardizem drip  Diabetes type 2 -Patient currently running in the mid 200s on CBG start Lantus 8 units QHS -Continue resistant SSI -Obtain hemoglobin A1c  HTN -Will hold metoprolol 25 mg BID secondary to patient's hypotension, if BP improves with the initiation of the Cardizem drip and patient becomes hypertensive, can reinitiate  HLD -Continue Lipitor 10 mg daily -Obtain lipid panel  COPD -Continue DuoNeb TID -Flutter valve q 4 hr while awake   Depression -Continue Cymbalta 30 mg daily      Code Status: Partial Family Communication: none Disposition Plan: per Oncology   Time spent:50 min  Allie Bossier [facility] Pager [phone]  If 7PM-7AM, please contact night-coverage [email or web address] Password [facility] 04/12/2014, 7:45 PM

## 2014-04-13 ENCOUNTER — Ambulatory Visit: Payer: Medicare Other

## 2014-04-13 ENCOUNTER — Telehealth: Payer: Self-pay | Admitting: Oncology

## 2014-04-13 ENCOUNTER — Encounter: Payer: Medicare Other | Admitting: Radiation Oncology

## 2014-04-13 ENCOUNTER — Ambulatory Visit (HOSPITAL_COMMUNITY)
Admission: RE | Admit: 2014-04-13 | Discharge: 2014-04-13 | Disposition: A | Discharge status: Home / Self Care | Payer: Medicare Other | Source: Ambulatory Visit | Attending: Oncology | Admitting: Oncology

## 2014-04-13 ENCOUNTER — Ambulatory Visit (HOSPITAL_COMMUNITY): Admission: RE | Admit: 2014-04-13 | Payer: Medicare Other | Source: Ambulatory Visit

## 2014-04-13 VITALS — BP 109/66 | HR 85 | Resp 20

## 2014-04-13 DIAGNOSIS — C3410 Malignant neoplasm of upper lobe, unspecified bronchus or lung: Secondary | ICD-10-CM

## 2014-04-13 LAB — CBC WITH DIFFERENTIAL/PLATELET
Basophils Absolute: 0 10*3/uL (ref 0.0–0.1)
Basophils Relative: 0 % (ref 0–1)
EOS ABS: 0.1 10*3/uL (ref 0.0–0.7)
Eosinophils Relative: 1 % (ref 0–5)
HEMATOCRIT: 37.9 % — AB (ref 39.0–52.0)
Hemoglobin: 12.2 g/dL — ABNORMAL LOW (ref 13.0–17.0)
Lymphocytes Relative: 15 % (ref 12–46)
Lymphs Abs: 1 10*3/uL (ref 0.7–4.0)
MCH: 28.1 pg (ref 26.0–34.0)
MCHC: 32.2 g/dL (ref 30.0–36.0)
MCV: 87.3 fL (ref 78.0–100.0)
MONO ABS: 0.9 10*3/uL (ref 0.1–1.0)
Monocytes Relative: 15 % — ABNORMAL HIGH (ref 3–12)
Neutro Abs: 4.3 10*3/uL (ref 1.7–7.7)
Neutrophils Relative %: 69 % (ref 43–77)
PLATELETS: 156 10*3/uL (ref 150–400)
RBC: 4.34 MIL/uL (ref 4.22–5.81)
RDW: 16 % — ABNORMAL HIGH (ref 11.5–15.5)
WBC: 6.3 10*3/uL (ref 4.0–10.5)

## 2014-04-13 LAB — LIPID PANEL
CHOL/HDL RATIO: 1.8 ratio
Cholesterol: 126 mg/dL (ref 0–200)
HDL: 72 mg/dL (ref >39)
LDL CALC: 23 mg/dL (ref 0–99)
Triglycerides: 157 mg/dL — ABNORMAL HIGH (ref <150)
VLDL: 31 mg/dL (ref 0–40)

## 2014-04-13 LAB — GLUCOSE, CAPILLARY
GLUCOSE-CAPILLARY: 223 mg/dL — AB (ref 70–99)
GLUCOSE-CAPILLARY: 266 mg/dL — AB (ref 70–99)
Glucose-Capillary: 236 mg/dL — ABNORMAL HIGH (ref 70–99)
Glucose-Capillary: 282 mg/dL — ABNORMAL HIGH (ref 70–99)
Glucose-Capillary: 239 mg/dL — ABNORMAL HIGH (ref 70–99)

## 2014-04-13 LAB — COMPREHENSIVE METABOLIC PANEL
ALT: 16 U/L (ref 0–53)
AST: 15 U/L (ref 0–37)
Albumin: 2.7 g/dL — ABNORMAL LOW (ref 3.5–5.2)
Alkaline Phosphatase: 52 U/L (ref 39–117)
BUN: 32 mg/dL — ABNORMAL HIGH (ref 6–23)
CO2: 35 mEq/L — ABNORMAL HIGH (ref 19–32)
Calcium: 9 mg/dL (ref 8.4–10.5)
Chloride: 88 mEq/L — ABNORMAL LOW (ref 96–112)
Creatinine, Ser: 1.1 mg/dL (ref 0.50–1.35)
GFR calc Af Amer: 72 mL/min — ABNORMAL LOW (ref >90)
GFR calc non Af Amer: 62 mL/min — ABNORMAL LOW (ref >90)
Glucose, Bld: 279 mg/dL — ABNORMAL HIGH (ref 70–99)
Potassium: 3.6 mEq/L — ABNORMAL LOW (ref 3.7–5.3)
Sodium: 133 mEq/L — ABNORMAL LOW (ref 137–147)
Total Bilirubin: 1.2 mg/dL (ref 0.3–1.2)
Total Protein: 6.2 g/dL (ref 6.0–8.3)

## 2014-04-13 LAB — MAGNESIUM: Magnesium: 1.7 mg/dL (ref 1.5–2.5)

## 2014-04-13 LAB — PROTIME-INR
INR: 0.95 (ref 0.00–1.49)
PROTHROMBIN TIME: 12.5 s (ref 11.6–15.2)

## 2014-04-13 LAB — HEMOGLOBIN A1C
HEMOGLOBIN A1C: 9.3 % — AB (ref <5.7)
Mean Plasma Glucose: 220 mg/dL — ABNORMAL HIGH (ref <117)

## 2014-04-13 MED ORDER — MIDAZOLAM HCL 2 MG/2ML IJ SOLN
INTRAMUSCULAR | Status: AC
  Filled 2014-04-13: qty 4

## 2014-04-13 MED ORDER — VANCOMYCIN HCL IN DEXTROSE 1-5 GM/200ML-% IV SOLN
1000.0000 mg | INTRAVENOUS | Status: AC
  Administered 2014-04-13: 1000 mg via INTRAVENOUS
  Filled 2014-04-13: qty 200

## 2014-04-13 MED ORDER — HEPARIN SOD (PORK) LOCK FLUSH 100 UNIT/ML IV SOLN
INTRAVENOUS | Status: AC
  Filled 2014-04-13: qty 5

## 2014-04-13 MED ORDER — GABAPENTIN 300 MG PO CAPS
600.0000 mg | ORAL_CAPSULE | Freq: Three times a day (TID) | ORAL | Status: DC
  Administered 2014-04-13 – 2014-04-16 (×10): 600 mg via ORAL
  Filled 2014-04-13 (×12): qty 2

## 2014-04-13 MED ORDER — LIDOCAINE HCL 1 % IJ SOLN
INTRAMUSCULAR | Status: AC
  Filled 2014-04-13: qty 20

## 2014-04-13 MED ORDER — ATORVASTATIN CALCIUM 80 MG PO TABS
80.0000 mg | ORAL_TABLET | Freq: Every day | ORAL | Status: DC
  Administered 2014-04-13 – 2014-04-15 (×3): 80 mg via ORAL
  Filled 2014-04-13: qty 2
  Filled 2014-04-13 (×3): qty 1

## 2014-04-13 MED ORDER — FENTANYL CITRATE 0.05 MG/ML IJ SOLN
INTRAMUSCULAR | Status: AC | PRN
  Administered 2014-04-13: 25 ug via INTRAVENOUS

## 2014-04-13 MED ORDER — METOPROLOL TARTRATE 12.5 MG HALF TABLET
12.5000 mg | ORAL_TABLET | Freq: Two times a day (BID) | ORAL | Status: DC
  Administered 2014-04-13 – 2014-04-14 (×4): 12.5 mg via ORAL
  Filled 2014-04-13 (×6): qty 1

## 2014-04-13 MED ORDER — FENTANYL CITRATE 0.05 MG/ML IJ SOLN
INTRAMUSCULAR | Status: AC
  Filled 2014-04-13: qty 4

## 2014-04-13 MED ORDER — MIDAZOLAM HCL 2 MG/2ML IJ SOLN
INTRAMUSCULAR | Status: AC | PRN
  Administered 2014-04-13: 1 mg via INTRAVENOUS

## 2014-04-13 MED ORDER — INSULIN GLARGINE 100 UNIT/ML ~~LOC~~ SOLN
15.0000 [IU] | Freq: Every day | SUBCUTANEOUS | Status: DC
  Administered 2014-04-13 – 2014-04-14 (×2): 15 [IU] via SUBCUTANEOUS
  Filled 2014-04-13 (×2): qty 0.15

## 2014-04-13 MED ORDER — ENOXAPARIN SODIUM 40 MG/0.4ML ~~LOC~~ SOLN
40.0000 mg | SUBCUTANEOUS | Status: DC
  Administered 2014-04-14 – 2014-04-16 (×3): 40 mg via SUBCUTANEOUS
  Filled 2014-04-13 (×4): qty 0.4

## 2014-04-13 MED ORDER — CEFAZOLIN SODIUM-DEXTROSE 2-3 GM-% IV SOLR
2.0000 g | INTRAVENOUS | Status: AC
  Administered 2014-04-13: 2 g via INTRAVENOUS
  Filled 2014-04-13 (×2): qty 50

## 2014-04-13 NOTE — Progress Notes (Signed)
    Subjective:  Denies CP; mild dyspnea   Objective:  Filed Vitals:   04/13/14 0300 04/13/14 0400 04/13/14 0500 04/13/14 0600  BP: 87/51 89/59 109/55 93/55  Pulse: 80 78 81 79  Temp:      TempSrc:      Resp: 15 19 20 20   Height:      Weight:      SpO2: 90% 97% 91% 99%    Intake/Output from previous day:  Intake/Output Summary (Last 24 hours) at 04/13/14 0739 Last data filed at 04/13/14 1561  Gross per 24 hour  Intake 353.99 ml  Output   3375 ml  Net -3021.01 ml    Physical Exam: Physical exam: Well-developed well-nourished in no acute distress.  Skin is warm and dry.  HEENT is normal.  Neck is supple.  Chest mild exp wheeze Cardiovascular exam is regular rate and rhythm.  Abdominal exam nontender or distended. No masses palpated. Extremities show no edema. neuro grossly intact    Lab Results: Basic Metabolic Panel:  Recent Labs  04/12/14 0312 04/13/14 0315  NA 132* 133*  K 4.6 3.6*  CL 89* 88*  CO2 30 35*  GLUCOSE 234* 279*  BUN 28* 32*  CREATININE 1.20 1.10  CALCIUM 8.9 9.0  MG 1.9 1.7   CBC:  Recent Labs  04/10/14 1145  04/12/14 0312 04/13/14 0315  WBC 7.3  < > 7.2 6.3  NEUTROABS 5.3  --   --  4.3  HGB 11.0*  < > 11.6* 12.2*  HCT 34.3*  < > 36.5* 37.9*  MCV 87.5  < > 86.9 87.3  PLT 166  < > 161 156  < > = values in this interval not displayed. Cardiac Enzymes:  Recent Labs  04/10/14 1145  TROPONINI 0.55*     Assessment/Plan:  1 coronary artery disease-cath results noted. Patient has severe coronary artery disease. As outlined given his lung cancer and multiple comorbidities plan medical therapy. Continue aspirin and statin. Add metoprolol. 2 paroxysmal atrial fibrillation-patient developed atrial fibrillation with a rapid ventricular response last evening. He is now back in sinus rhythm. I will add metoprolol. DC cardizem. Continue aspirin. I will avoid anticoagulation at this point given lung cancer and need for procedures. If  A. Fib recurs will consider amiodarone. 3 Lung cancer-management per oncology. 4 hyperlipidemia-continue statin. 5 acute diastolic congestive heart failure-much improved. Change Lasix to 40 mg daily and follow renal function.   Kirk Ruths 04/13/2014, 7:39 AM

## 2014-04-13 NOTE — Procedures (Signed)
R IJ Port cathter placement with US and fluoroscopy No complication No blood loss. See complete dictation in [facility] PACS.  

## 2014-04-13 NOTE — Telephone Encounter (Signed)
Called Jeneen Rinks' nurse, Edd Arbour, RN, to let her know that Mr. O'Neal will have radiation treatment today.  Ronnie verbalized agreement and said he is doing good today.  He is off the heparin and Cardizem drips. He is on oxygen by nasal canula.

## 2014-04-13 NOTE — Progress Notes (Signed)
Patient ID: [name] [name], male   DOB: 1933-08-08, 80 y.o.   MRN: [id number] Request received for port a cath placement in pt with history of NSC (squamous cell) left lung carcinoma and NHL.He began radiation to the left chest last week and is scheduled to begin  chemotherapy on 04/18/14.   He was admitted 04/10/2013 with acute onset shortness of breath and chest discomfort. He was noted to have EKG changes and diagnosed with acute congestive heart failure and acute coronary syndrome.He underwent a cardiac catheterization and was noted to have significant coronary artery disease. He has been managed with medical therapy and reports feeling better. Per cardiology pt is at high risk for any procedure and delaying port placement will not change this risk due to severe disease. Additional PMH as below. Exam: pt awake/alert; chest- bilat exp wheezes noted; heart-RRR; abd- soft,+BS,NT; ext- FROM , no edema.  Filed Vitals:   04/13/14 0700 04/13/14 0800 04/13/14 0900 04/13/14 0911  BP: 106/47 112/62 115/58   Pulse: 34 81 85   Temp:  98.5 F (36.9 C)    TempSrc:  Oral    Resp: 24 14 22    Height:      Weight:      SpO2: 97% 96% 93% 95%   Past Medical History  Diagnosis Date  . Peripheral arterial disease     s/p Ao Bifem Bypass  . Diabetes mellitus without complication   . CAD (coronary artery disease)     By  CT Scan  . Hyperlipidemia   . Hypertension   . Non Hodgkin's lymphoma 2000, 2010  . Peripheral neuropathy   . COPD (chronic obstructive pulmonary disease)   . History of lower GI bleeding   . Substance abuse   . Lung cancer, upper lobe     left lung  . AAA (abdominal aortic aneurysm)    Past Surgical History  Procedure Laterality Date  . Fracture surgery  2009    Left ankle  . Pr vein bypass graft,aorto-fem-pop  2005    Right common femoral to BK popliteal BPG  . Pr vein bypass graft,aorto-fem-pop  01-27-05    Revision of Right fem-pop BPG  . Cardiac catheterization  1990's  . Video  bronchoscopy Bilateral 03/14/2014    Procedure: VIDEO BRONCHOSCOPY WITH FLUORO;  Surgeon: Rigoberto Noel, MD;  Location: [facility];  Service: Cardiopulmonary;  Laterality: Bilateral;  . Nm myoview ltd  11/2013    Lexiscan.  EF 60%, small fixed inferoapical defect - ? artifact  . Transthoracic echocardiogram  11/2013    EF 60-65%, Gr 1 DD, Mod AS, Sever MAC - no MS.      Dg Chest 2 View  04/10/2014   CLINICAL DATA:  Shortness of breath. History of lung cancer. 04/06/2014  EXAM: CHEST  2 VIEW  COMPARISON:  None.  FINDINGS: Normal heart size. a calcified atherosclerotic disease involves the thoracic aorta. Left perihilar mass and atelectasis of the left upper lobe is again noted. There are small bilateral pleural effusions and interstitial edema consistent with CHF.  IMPRESSION: 1. Mild CHF.   Electronically Signed   By: Kerby Moors M.D.   On: 04/10/2014 11:39   Dg Chest 2 View  04/06/2014   CLINICAL DATA:  copd  EXAM: CHEST  2 VIEW  COMPARISON:  Portable chest 03/21/2014  FINDINGS: Stable left perihilar density.  Decreased conspicuity of the hazy density in the left hemithorax. Linear areas of density identified within the left lung base. No further  focal regions of consolidation or focal infiltrates. Right hemi thorax unremarkable. Atherosclerotic calcifications in the aorta. Calcified mitral valve annulus and calcifications within the aortic arch. Degenerative changes, mild right shoulder.  IMPRESSION: Decreased hazy density left hemi thorax possibly reflecting an improved left pleural effusion. No new focal regions of consolidation or new focal infiltrates.   Electronically Signed   By: Margaree Mackintosh M.D.   On: 04/06/2014 17:31   Ct Head Wo Contrast  03/21/2014   CLINICAL DATA:  Fall, scalp hematoma  EXAM: CT HEAD WITHOUT CONTRAST  TECHNIQUE: Contiguous axial images were obtained from the base of the skull through the vertex without intravenous contrast.  COMPARISON:  CT PET scan 02/20/2009   FINDINGS: No skull fracture is noted. Paranasal sinuses and mastoid air cells are unremarkable. Mild cerebral atrophy again noted. Atherosclerotic calcifications of carotid siphon. No intracranial hemorrhage, mass effect or midline shift. No acute cortical infarction. No mass lesion is noted on this unenhanced scan.  IMPRESSION: No acute intracranial abnormality.  Mild cerebral atrophy.   Electronically Signed   By: Lahoma Crocker M.D.   On: 03/21/2014 15:41   Dg Chest Portable 1 View  04/10/2014   CLINICAL DATA:  Shortness of breath.  EXAM: PORTABLE CHEST - 1 VIEW  COMPARISON:  04/10/2014.  FINDINGS: Cardiomegaly with pulmonary venous congestion and bilateral interstitial prominence noted consistent with congestive heart failure with pulmonary edema. Persistent large left upper lung/perihilar mass noted. Patient has pacer pad over the chest. No pneumothorax. No acute bony abnormality .  IMPRESSION: 1. Congestive heart failure or from interstitial edema. 2. Persistent large left upper lobe/perihilar mass.   Electronically Signed   By: Marcello Moores  Register   On: 04/10/2014 13:15   Dg Chest Portable 1 View  03/21/2014   CLINICAL DATA:  80 year old male with shortness of breath following fall.  EXAM: PORTABLE CHEST - 1 VIEW  COMPARISON:  03/14/2014 chest radiograph and CT.  FINDINGS: A left perihilar mass is again noted and relatively unchanged.  New haziness overlying the left hemi thorax may represent a layering effusion.  There is no evidence of pneumothorax or right pleural effusion.  The right lung is clear.  No acute bony abnormalities are identified.  IMPRESSION: New haziness overlying the left hemi thorax which could represent a layering pleural effusion.  Unchanged right perihilar mass.   Electronically Signed   By: Hassan Rowan M.D.   On: 03/21/2014 15:18  Results for orders placed during the hospital encounter of 04/10/14  MRSA PCR SCREENING      Result Value Ref Range   MRSA by PCR POSITIVE (*) NEGATIVE  CBC  WITH DIFFERENTIAL      Result Value Ref Range   WBC 7.3  4.0 - 10.5 K/uL   RBC 3.92 (*) 4.22 - 5.81 MIL/uL   Hemoglobin 11.0 (*) 13.0 - 17.0 g/dL   HCT 34.3 (*) 39.0 - 52.0 %   MCV 87.5  78.0 - 100.0 fL   MCH 28.1  26.0 - 34.0 pg   MCHC 32.1  30.0 - 36.0 g/dL   RDW 16.5 (*) 11.5 - 15.5 %   Platelets 166  150 - 400 K/uL   Neutrophils Relative % 73  43 - 77 %   Neutro Abs 5.3  1.7 - 7.7 K/uL   Lymphocytes Relative 17  12 - 46 %   Lymphs Abs 1.3  0.7 - 4.0 K/uL   Monocytes Relative 9  3 - 12 %   Monocytes Absolute 0.7  0.1 - 1.0 K/uL   Eosinophils Relative 1  0 - 5 %   Eosinophils Absolute 0.1  0.0 - 0.7 K/uL   Basophils Relative 0  0 - 1 %   Basophils Absolute 0.0  0.0 - 0.1 K/uL  BASIC METABOLIC PANEL      Result Value Ref Range   Sodium 141  137 - 147 mEq/L   Potassium 3.3 (*) 3.7 - 5.3 mEq/L   Chloride 102  96 - 112 mEq/L   CO2 28  19 - 32 mEq/L   Glucose, Bld 298 (*) 70 - 99 mg/dL   BUN 12  6 - 23 mg/dL   Creatinine, Ser 0.75  0.50 - 1.35 mg/dL   Calcium 9.3  8.4 - 10.5 mg/dL   GFR calc non Af Amer 85 (*) >90 mL/min   GFR calc Af Amer >90  >90 mL/min  TROPONIN I      Result Value Ref Range   Troponin I 0.55 (*) <0.30 ng/mL  PRO B NATRIURETIC PEPTIDE      Result Value Ref Range   Pro B Natriuretic peptide (BNP) 5244.0 (*) 0 - 450 pg/mL  HEMOGLOBIN A1C      Result Value Ref Range   Hemoglobin A1C 9.2 (*) <5.7 %   Mean Plasma Glucose 217 (*) <117 mg/dL  CBC      Result Value Ref Range   WBC 9.3  4.0 - 10.5 K/uL   RBC 4.40  4.22 - 5.81 MIL/uL   Hemoglobin 12.3 (*) 13.0 - 17.0 g/dL   HCT 38.2 (*) 39.0 - 52.0 %   MCV 86.8  78.0 - 100.0 fL   MCH 28.0  26.0 - 34.0 pg   MCHC 32.2  30.0 - 36.0 g/dL   RDW 16.5 (*) 11.5 - 15.5 %   Platelets 189  150 - 400 K/uL  HEPARIN LEVEL (UNFRACTIONATED)      Result Value Ref Range   Heparin Unfractionated 0.15 (*) 0.30 - 0.70 IU/mL  BASIC METABOLIC PANEL      Result Value Ref Range   Sodium 140  137 - 147 mEq/L   Potassium 4.1   3.7 - 5.3 mEq/L   Chloride 92 (*) 96 - 112 mEq/L   CO2 35 (*) 19 - 32 mEq/L   Glucose, Bld 290 (*) 70 - 99 mg/dL   BUN 16  6 - 23 mg/dL   Creatinine, Ser 0.91  0.50 - 1.35 mg/dL   Calcium 9.2  8.4 - 10.5 mg/dL   GFR calc non Af Amer 78 (*) >90 mL/min   GFR calc Af Amer >90  >90 mL/min  GLUCOSE, CAPILLARY      Result Value Ref Range   Glucose-Capillary 349 (*) 70 - 99 mg/dL  BASIC METABOLIC PANEL      Result Value Ref Range   Sodium 137  137 - 147 mEq/L   Potassium 3.6 (*) 3.7 - 5.3 mEq/L   Chloride 90 (*) 96 - 112 mEq/L   CO2 33 (*) 19 - 32 mEq/L   Glucose, Bld 194 (*) 70 - 99 mg/dL   BUN 18  6 - 23 mg/dL   Creatinine, Ser 0.97  0.50 - 1.35 mg/dL   Calcium 9.3  8.4 - 10.5 mg/dL   GFR calc non Af Amer 76 (*) >90 mL/min   GFR calc Af Amer 89 (*) >90 mL/min  GLUCOSE, CAPILLARY      Result Value Ref Range   Glucose-Capillary 262 (*) 70 -  99 mg/dL  HEPARIN LEVEL (UNFRACTIONATED)      Result Value Ref Range   Heparin Unfractionated 0.40  0.30 - 0.70 IU/mL  CBC      Result Value Ref Range   WBC 7.2  4.0 - 10.5 K/uL   RBC 4.20 (*) 4.22 - 5.81 MIL/uL   Hemoglobin 11.6 (*) 13.0 - 17.0 g/dL   HCT 36.5 (*) 39.0 - 52.0 %   MCV 86.9  78.0 - 100.0 fL   MCH 27.6  26.0 - 34.0 pg   MCHC 31.8  30.0 - 36.0 g/dL   RDW 16.3 (*) 11.5 - 15.5 %   Platelets 161  150 - 400 K/uL  HEPARIN LEVEL (UNFRACTIONATED)      Result Value Ref Range   Heparin Unfractionated 0.43  0.30 - 0.70 IU/mL  BASIC METABOLIC PANEL      Result Value Ref Range   Sodium 132 (*) 137 - 147 mEq/L   Potassium 4.6  3.7 - 5.3 mEq/L   Chloride 89 (*) 96 - 112 mEq/L   CO2 30  19 - 32 mEq/L   Glucose, Bld 234 (*) 70 - 99 mg/dL   BUN 28 (*) 6 - 23 mg/dL   Creatinine, Ser 1.20  0.50 - 1.35 mg/dL   Calcium 8.9  8.4 - 10.5 mg/dL   GFR calc non Af Amer 56 (*) >90 mL/min   GFR calc Af Amer 65 (*) >90 mL/min  GLUCOSE, CAPILLARY      Result Value Ref Range   Glucose-Capillary 216 (*) 70 - 99 mg/dL  GLUCOSE, CAPILLARY       Result Value Ref Range   Glucose-Capillary 215 (*) 70 - 99 mg/dL  MAGNESIUM      Result Value Ref Range   Magnesium 1.9  1.5 - 2.5 mg/dL  GLUCOSE, CAPILLARY      Result Value Ref Range   Glucose-Capillary 193 (*) 70 - 99 mg/dL  GLUCOSE, CAPILLARY      Result Value Ref Range   Glucose-Capillary 234 (*) 70 - 99 mg/dL  GLUCOSE, CAPILLARY      Result Value Ref Range   Glucose-Capillary 272 (*) 70 - 99 mg/dL  GLUCOSE, CAPILLARY      Result Value Ref Range   Glucose-Capillary 174 (*) 70 - 99 mg/dL   Comment 1 Documented in Chart     Comment 2 Notify RN    COMPREHENSIVE METABOLIC PANEL      Result Value Ref Range   Sodium 133 (*) 137 - 147 mEq/L   Potassium 3.6 (*) 3.7 - 5.3 mEq/L   Chloride 88 (*) 96 - 112 mEq/L   CO2 35 (*) 19 - 32 mEq/L   Glucose, Bld 279 (*) 70 - 99 mg/dL   BUN 32 (*) 6 - 23 mg/dL   Creatinine, Ser 1.10  0.50 - 1.35 mg/dL   Calcium 9.0  8.4 - 10.5 mg/dL   Total Protein 6.2  6.0 - 8.3 g/dL   Albumin 2.7 (*) 3.5 - 5.2 g/dL   AST 15  0 - 37 U/L   ALT 16  0 - 53 U/L   Alkaline Phosphatase 52  39 - 117 U/L   Total Bilirubin 1.2  0.3 - 1.2 mg/dL   GFR calc non Af Amer 62 (*) >90 mL/min   GFR calc Af Amer 72 (*) >90 mL/min  MAGNESIUM      Result Value Ref Range   Magnesium 1.7  1.5 - 2.5 mg/dL  CBC WITH  DIFFERENTIAL      Result Value Ref Range   WBC 6.3  4.0 - 10.5 K/uL   RBC 4.34  4.22 - 5.81 MIL/uL   Hemoglobin 12.2 (*) 13.0 - 17.0 g/dL   HCT 37.9 (*) 39.0 - 52.0 %   MCV 87.3  78.0 - 100.0 fL   MCH 28.1  26.0 - 34.0 pg   MCHC 32.2  30.0 - 36.0 g/dL   RDW 16.0 (*) 11.5 - 15.5 %   Platelets 156  150 - 400 K/uL   Neutrophils Relative % 69  43 - 77 %   Neutro Abs 4.3  1.7 - 7.7 K/uL   Lymphocytes Relative 15  12 - 46 %   Lymphs Abs 1.0  0.7 - 4.0 K/uL   Monocytes Relative 15 (*) 3 - 12 %   Monocytes Absolute 0.9  0.1 - 1.0 K/uL   Eosinophils Relative 1  0 - 5 %   Eosinophils Absolute 0.1  0.0 - 0.7 K/uL   Basophils Relative 0  0 - 1 %   Basophils  Absolute 0.0  0.0 - 0.1 K/uL  PROTIME-INR      Result Value Ref Range   Prothrombin Time 12.5  11.6 - 15.2 seconds   INR 0.95  0.00 - 1.49  LIPID PANEL      Result Value Ref Range   Cholesterol 126  0 - 200 mg/dL   Triglycerides 157 (*) <150 mg/dL   HDL 72  >39 mg/dL   Total CHOL/HDL Ratio 1.8     VLDL 31  0 - 40 mg/dL   LDL Cholesterol 23  0 - 99 mg/dL  GLUCOSE, CAPILLARY      Result Value Ref Range   Glucose-Capillary 329 (*) 70 - 99 mg/dL   Comment 1 Notify RN    GLUCOSE, CAPILLARY      Result Value Ref Range   Glucose-Capillary 236 (*) 70 - 99 mg/dL   A/P: Pt with history of NSC (squamous cell) left lung carcinoma and NHL.He began radiation to the left chest last week and is scheduled to begin chemotherapy on 04/18/14.   He was admitted 04/10/2013 with acute onset shortness of breath and chest discomfort. He was noted to have EKG changes and diagnosed with acute congestive heart failure and acute coronary syndrome.He underwent a cardiac catheterization and was noted to have significant coronary artery disease which will managed medically.  He is currently stable and plan is for port a cath placement today with minimal IV conscious sedation.  Details/risks of procedure d/w pt/son with their understanding and consent. Drs.Sherrill/Crenshaw aware of plans.

## 2014-04-13 NOTE — Progress Notes (Signed)
PROGRESS NOTE  [name] [name] VCB:[id number] DOB: April 20, 1933 DOA: 04/10/2014 PCP: [name], MD  HPI: 80 y.o.WM PMHx diabetes type 2, CAD, HTN, HLD, PAD, GIB (lower), substance abuse, Non-Hodgkins Lymphoma (2000, 2010) & COPD and Squamous cell carcinoma of the left lung presenting with left upper lobe bronchial obstruction and collapse 04/06/2014. S/P radiation treatment by Dr. [name]. Initially scheduled to start chemotherapy by Dr. [name] (oncology). Recent cardiac workup with relatively normal function ( mod AS) & non-ischemic Nuclear ST.  Presented to [facility] ER with ~24 hr worsening dyspnea with orthopnea. proBNP on eval was 5200 with mild troponin elevation. Sz)2 90% on RA --> 97% on O2 3L. While in the ER he developed acute onset chest pressure.tightness and more significant dyspnea. ECG with worsening LBBB findings with lateral ST depressions concerning for ischemic changes. He also had a short run of NSVT (later noted to be S Tachy with worsened LBBB) given IV Amiodarone bolus & 60mg  IV Lasix + SL NTG.  "Code STEMI" callled - but not true criteria for STEMI - more consistent with Urgent NSTEMI. With new onset CHF & + Troponin with acute anginal chest pain, the decision was made to transfer the patient to [facility] Cath lab for urgent Cardiac Cath on 6/14.   Assessment/Plan:  NSTEMI  - On 6/14 underwent Left Heart Catheterization with Native Coroanry Angiography via Right Radial Artery access; multivessel CAD was diagnosed (see results below), and conservative management was decided upon by family and cardiac team. Patient transferred to [facility] long for additional oncology workup/definitive treatment.  - Continue 81 mg daily  - cardiology consulted  A. fib RVR  - went into A fib 6/16, started cardizem gtt, converted back to sinis this morning. Add metoprolol per cardiology  Non-Hodgkin's lymphoma  - Stable, followed by oncology as an outpatient.   Squamous cell carcinoma lung  -Treatment to  start next week, port placement today, appreciate IR consult.   Diabetes type 2  - A1C 9.2, increase Lantus given elevated fasting this morning - continue sliding scale. Will need insulin at home.   Acute on chronic diastolic heart failure  - continue Lasix, respiratory status improving  Acute hypoxic respiratory failure, present on admission, due to NSTEMI/CHF - improving with diuresis.   HTN  - BP better, continue current regimen.   HLD  -Continue Lipitor 10 mg daily   COPD  -Continue DuoNeb TID  -Flutter valve q 4 hr while awake   Depression  -Continue Cymbalta 30 mg daily  Diet: NPO Fluids: none DVT Prophylaxis: Lovenox on hold today due to port, resume 6/18  Code Status: Partial Family Communication: d/w patient  Disposition Plan: inpatient  Consultants:  Cardiology  Oncology  IR  Procedures:  Cardiac cath 6/14 -Severe multivessel CAD -  Extensive thrombotic stenosis throughout the proximal and mid RCA as well as a focal distal RCA thrombotic lesion; these range from 90-50% with poststenotic dilatation/ectasia,  70% ostial and 80% distal left main disease with significant calcification along with proximal LAD long calcified irregular 50% stenosis   Antibiotics None   HPI/Subjective: - feeling well this morning, no CP/SOB  Objective: Filed Vitals:   04/13/14 0300 04/13/14 0400 04/13/14 0500 04/13/14 0600  BP: 87/51 89/59 109/55 93/55  Pulse: 80 78 81 79  Temp:      TempSrc:      Resp: 15 19 20 20   Height:      Weight:      SpO2: 90% 97% 91% 99%  Intake/Output Summary (Last 24 hours) at 04/13/14 0735 Last data filed at 04/13/14 0634  Gross per 24 hour  Intake 353.99 ml  Output   3375 ml  Net -3021.01 ml   Filed Weights   04/10/14 1300 04/10/14 1700 04/11/14 0500  Weight: 100.699 kg (222 lb) 100 kg (220 lb 7.4 oz) 90.22 kg (198 lb 14.4 oz)    Exam:   General:  NAD  Cardiovascular: regular rate and rhythm, without  MRG  Respiratory: good air movement, mild wheezing throughout  Abdomen: soft, not tender to palpation, positive bowel sounds  MSK: no peripheral edema  Neuro: non focal  Data Reviewed: Basic Metabolic Panel:  Recent Labs Lab 04/10/14 1145 04/11/14 0850 04/11/14 1225 04/12/14 0312 04/13/14 0315  NA 141 140 137 132* 133*  K 3.3* 4.1 3.6* 4.6 3.6*  CL 102 92* 90* 89* 88*  CO2 28 35* 33* 30 35*  GLUCOSE 298* 290* 194* 234* 279*  BUN 12 16 18  28* 32*  CREATININE 0.75 0.91 0.97 1.20 1.10  CALCIUM 9.3 9.2 9.3 8.9 9.0  MG  --   --   --  1.9 1.7   Liver Function Tests:  Recent Labs Lab 04/13/14 0315  AST 15  ALT 16  ALKPHOS 52  BILITOT 1.2  PROT 6.2  ALBUMIN 2.7*   CBC:  Recent Labs Lab 04/10/14 1145 04/11/14 0850 04/12/14 0312 04/13/14 0315  WBC 7.3 9.3 7.2 6.3  NEUTROABS 5.3  --   --  4.3  HGB 11.0* 12.3* 11.6* 12.2*  HCT 34.3* 38.2* 36.5* 37.9*  MCV 87.5 86.8 86.9 87.3  PLT 166 189 161 156   Cardiac Enzymes:  Recent Labs Lab 04/10/14 1145  TROPONINI 0.55*   BNP (last 3 results)  Recent Labs  04/10/14 1145  PROBNP 5244.0*   CBG:  Recent Labs Lab 04/11/14 2139 04/12/14 0745 04/12/14 1129 04/12/14 1631 04/12/14 2124  GLUCAP 193* 234* 272* 174* 329*    Recent Results (from the past 240 hour(s))  MRSA PCR SCREENING     Status: Abnormal   Collection Time    04/10/14  7:45 PM      Result Value Ref Range Status   MRSA by PCR POSITIVE (*) NEGATIVE Final   Comment:            The GeneXpert MRSA Assay (FDA     approved for NASAL specimens     only), is one component of a     comprehensive MRSA colonization     surveillance program. It is not     intended to diagnose MRSA     infection nor to guide or     monitor treatment for     MRSA infections.     RESULT CALLED TO, READ BACK BY AND VERIFIED WITHUlis Rias RN [id number] AT 2126 SKEEN,P     Studies: No results found.  Scheduled Meds: . antiseptic oral rinse  15 mL Mouth Rinse  q12n4p  . aspirin  81 mg Oral Daily  . atorvastatin  10 mg Oral q1800  . chlorhexidine  15 mL Mouth Rinse BID  . Chlorhexidine Gluconate Cloth  6 each Topical Q0600  . DULoxetine  30 mg Oral Daily  . finasteride  5 mg Oral Daily  . furosemide  40 mg Intravenous Q12H  . gabapentin  600 mg Oral TID  . hyaluronate sodium  1 application Topical BID  . insulin aspart  0-20 Units Subcutaneous TID WC  . insulin  glargine  8 Units Subcutaneous QHS  . ipratropium-albuterol  3 mL Nebulization TID  . mupirocin ointment  1 application Nasal BID  . sodium chloride  3 mL Intravenous Q12H  . tamsulosin  0.4 mg Oral Daily   Continuous Infusions: . diltiazem (CARDIZEM) infusion Stopped (04/13/14 0104)    Principal Problem:   Squamous cell carcinoma lung Active Problems:   DIABETES MELLITUS, TYPE II, ON INSULIN   HYPERLIPIDEMIA   HYPERTENSION   CAD   PAD (peripheral artery disease)   AAA (abdominal aortic aneurysm) without rupture   COPD with exacerbation   Non Hodgkin's lymphoma   Lung mass   Lung cancer, upper lobe   NSTEMI (non-ST elevated myocardial infarction)   Depression   Time spent: 35  This note has been created with Surveyor, quantity. Any transcriptional errors are unintentional.   Marzetta Board, MD [facility] Pager [phone]. If 7 PM - 7 AM, please contact night-coverage at [email or web address], password [facility] 04/13/2014, 7:35 AM  LOS: 3 days

## 2014-04-13 NOTE — Progress Notes (Signed)
Inpatient Diabetes Program Recommendations  AACE/ADA: New Consensus Statement on Inpatient Glycemic Control (2013)  Target Ranges:  Prepandial:   less than 140 mg/dL      Peak postprandial:   less than 180 mg/dL (1-2 hours)      Critically ill patients:  140 - 180 mg/dL  Results for EUCLIDE, GRANITO (MRN [id number]) as of 04/13/2014 09:57  Ref. Range 04/12/2014 07:45 04/12/2014 11:29 04/12/2014 16:31 04/12/2014 21:24 04/13/2014 08:04  Glucose-Capillary Latest Range: 70-99 mg/dL 234 (H) 272 (H) 174 (H) 329 (H) 236 (H)   Consider increasing patient Lantus dose to 15 units and add Novolog meal coverage dose TID per Glycemic Control Order-set. Thank you  Raoul Pitch BSN, RN,CDE Inpatient Diabetes Coordinator [phone] (team pager)

## 2014-04-13 NOTE — Progress Notes (Signed)
IP PROGRESS NOTE  Subjective:   No chest pain. No specific complaint.  Objective: Vital signs in last 24 hours: Blood pressure 112/62, pulse 81, temperature 98.5 F (36.9 C), temperature source Oral, resp. rate 14, height 5\' 11"  (1.803 m), weight 198 lb 14.4 oz (90.22 kg), SpO2 96.00%.  Intake/Output from previous day: 06/16 0701 - 06/17 0700 In: 354 [P.O.:240; I.V.:64; IV Piggyback:50] Out: 5956 [Urine:3375]  Physical Exam:  HEENT: No thrush Lungs: Bilateral wheezing, good air movement bilaterally, no respiratory distress Cardiac: Irregular Abdomen: No hepatomegaly Extremities: No leg edema    Lab Results:  Recent Labs  04/12/14 0312 04/13/14 0315  WBC 7.2 6.3  HGB 11.6* 12.2*  HCT 36.5* 37.9*  PLT 161 156    BMET  Recent Labs  04/12/14 0312 04/13/14 0315  NA 132* 133*  K 4.6 3.6*  CL 89* 88*  CO2 30 35*  GLUCOSE 234* 279*  BUN 28* 32*  CREATININE 1.20 1.10  CALCIUM 8.9 9.0    Studies/Results: No results found.  Medications: I have reviewed the patient's current medications.  Assessment/Plan:  1. 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 admission 04/10/2014 with acute CHF and acute coronary syndrome, status post a cardiac catheterization 04/10/2014, clinical improvement with medical therapy  3. COPD 4. chronic renal failure 5. Diabetes 6. Neuropathy 7. history of non-Hodgkin's lymphoma   Mr. O'Neal appears improved from hospital admission. He will continue medical therapy for the coronary artery disease, atrial fibrillation, and heart failure her cardiology.  I discussed the lung cancer diagnosis and treatment options with [name] and his son. He is not a candidate for resection of the lung cancer. He appears to have  disease localized to the chest. The plan is to treat him with concurrent weekly chemotherapy and radiation with the hope of improving the dyspnea and potentially extending survival. He will undergo an outpatient PET scan to look for evidence of distant metastatic disease.  I will discuss the case with Dr. [name]. Radiation will likely be resumed today. He is scheduled for placement of a Port-A-Cath today. [name] will return to the Medical oncology clinic as scheduled 04/18/2014.  Recommendations: 1. Continue management of CAD/CHF/COPD per cardiology and medicine 2. increase ambulation as tolerated 3. consider need for home oxygen 4. Port-A-Cath placement per interventional radiology 5. resume radiation per Dr. [name] 6. outpatient followup at the [facility] 04/18/2014 7. please call Oncology as needed. I will check on him in the a.m. on 04/14/2014. I will then be out until 04/18/2014.   LOS: 3 days   Neiva Maenza  04/13/2014, 9:06 AM

## 2014-04-14 ENCOUNTER — Ambulatory Visit: Payer: Medicare Other | Admitting: Adult Health

## 2014-04-14 ENCOUNTER — Telehealth: Payer: Self-pay | Admitting: Oncology

## 2014-04-14 ENCOUNTER — Ambulatory Visit
Admission: RE | Admit: 2014-04-14 | Discharge: 2014-04-14 | Disposition: A | Discharge status: Home / Self Care | Payer: Medicare Other | Source: Ambulatory Visit | Attending: Radiation Oncology | Admitting: Radiation Oncology

## 2014-04-14 LAB — GLUCOSE, CAPILLARY
GLUCOSE-CAPILLARY: 327 mg/dL — AB (ref 70–99)
Glucose-Capillary: 222 mg/dL — ABNORMAL HIGH (ref 70–99)
Glucose-Capillary: 322 mg/dL — ABNORMAL HIGH (ref 70–99)
Glucose-Capillary: 221 mg/dL — ABNORMAL HIGH (ref 70–99)

## 2014-04-14 LAB — COMPREHENSIVE METABOLIC PANEL
ALBUMIN: 2.6 g/dL — AB (ref 3.5–5.2)
ALK PHOS: 53 U/L (ref 39–117)
ALT: 11 U/L (ref 0–53)
AST: 13 U/L (ref 0–37)
BUN: 19 mg/dL (ref 6–23)
CALCIUM: 9.1 mg/dL (ref 8.4–10.5)
CO2: 30 mEq/L (ref 19–32)
Chloride: 92 mEq/L — ABNORMAL LOW (ref 96–112)
Creatinine, Ser: 0.75 mg/dL (ref 0.50–1.35)
GFR calc Af Amer: >90 mL/min (ref >90)
GFR calc non Af Amer: 85 mL/min — ABNORMAL LOW (ref >90)
Glucose, Bld: 252 mg/dL — ABNORMAL HIGH (ref 70–99)
POTASSIUM: 4.3 meq/L (ref 3.7–5.3)
SODIUM: 132 meq/L — AB (ref 137–147)
TOTAL PROTEIN: 5.9 g/dL — AB (ref 6.0–8.3)
Total Bilirubin: 1.3 mg/dL — ABNORMAL HIGH (ref 0.3–1.2)

## 2014-04-14 LAB — CBC
HCT: 36.5 % — ABNORMAL LOW (ref 39.0–52.0)
Hemoglobin: 11.6 g/dL — ABNORMAL LOW (ref 13.0–17.0)
MCH: 27.5 pg (ref 26.0–34.0)
MCHC: 31.8 g/dL (ref 30.0–36.0)
MCV: 86.5 fL (ref 78.0–100.0)
Platelets: 151 10*3/uL (ref 150–400)
RBC: 4.22 MIL/uL (ref 4.22–5.81)
RDW: 15.8 % — AB (ref 11.5–15.5)
WBC: 4.9 10*3/uL (ref 4.0–10.5)

## 2014-04-14 LAB — MAGNESIUM: MAGNESIUM: 1.5 mg/dL (ref 1.5–2.5)

## 2014-04-14 MED ORDER — FUROSEMIDE 20 MG PO TABS
20.0000 mg | ORAL_TABLET | Freq: Every day | ORAL | Status: DC
  Administered 2014-04-14: 20 mg via ORAL
  Filled 2014-04-14 (×2): qty 1

## 2014-04-14 NOTE — Progress Notes (Signed)
Clinical Social Work Department BRIEF PSYCHOSOCIAL ASSESSMENT 04/14/2014  Patient:  [name] [name], [name] [name]     Account Number:  [id number]     Admit date:  04/10/2014  Clinical Social Worker:  Ulyess Blossom  Date/Time:  04/14/2014 03:30 PM  Referred by:  Physician  Date Referred:  04/14/2014 Referred for  SNF Placement   Other Referral:   Interview type:  Patient Other interview type:   and patient daughter and patient son via telephone    PSYCHOSOCIAL DATA Living Status:  ALONE Admitted from facility:   Level of care:   Primary support name:  Vickie Skeen/[phone]/daughter Primary support relationship to patient:  CHILD, ADULT Degree of support available:   Q[place]man Livings    pt son and pt daughter are supportive    CURRENT CONCERNS Current Concerns  Post-Acute Placement   Other Concerns:    SOCIAL WORK ASSESSMENT / PLAN CSW received referral from MD that pt family expressed concern about pt plan upon discharge and would like to seek placement. MD stated that pt family mentioned ALF, but pt may be more appropriate for SNF rehab as pt is undergoing radiation and will begin chemotherapy on Monday.    CSW reviewed chart and noted that PT recommending SNF for ST rehab. CSW met with pt at bedside. CSW introduced self and explained role. Pt shared that he lives alone, but has support from his family. CSW discussed that CSW was aware that pt children were initially asking about ALF, but PT evaluation recommends short term rehab at [facility] before further exploring ALF. CSW clarified the difference in SNF and ALF with pt and pt agrees that short term rehab at SNF will be more appropriate upon discharge. CSW inquired with pt about recent cancer diagnosis and pt shared that he was diagnosed with Squamous cell carcinoma lung in mid may. Pt shared that he had Non-Hodgkin's lymphoma in 2010 and CSW provided support as pt reflected upon his treatment during that time. Pt shared that he is  undergoing radiation and will begin chemotherapy on Monday. Pt shared that he will be receiving chemotherapy once per week. CSW discussed with pt that it will be beneficial to explore [facility] and [facility] SNF options as CSW is unsure that facilities in [facility] will transport pt daily to treatments given the distance. Pt expressed understanding and agreeable. Pt is agreeable to [facility] and [facility] search.    CSW contacted pt daughter, Arbie Cookey via telephone and updated pt daughter regarding discharge planning. Pt daughter expressed understanding regarding ST SNF and plans to work with pt son to assist pt in making decision.    CSW spoke with pt son, Lysle Yero via telephone. Pt son appreciative of phone call and was eager to discuss discharge plan. Pt son favors [facility] for placement for pt, but will review the options once available with pt and pt daughter to make decision.    CSW completed FL2 and initiated SNF search to [facility] and [facility].    CSW to follow up with pt and pt children regarding SNF bed offers.    CSW to continue to follow and facilitate pt discharge needs when pt medically stable for discharge.   Assessment/plan status:  Psychosocial Support/Ongoing Assessment of Needs Other assessment/ plan:   discharge planning   Information/referral to community resources:   [facility] and [facility] lists    PATIENT'S/FAMILY'S RESPONSE TO PLAN OF CARE: Pt alert and oriented x 4. Pt  in good spirits and reflected on the fact the he is doing better especially from a few days ago when he had to be rushed to [facility] for cardiac cath. Emotional support provided throughout assessment. Pt children supportive and actively involved in pt care and want to ensure that pt has appropriate care upon discharge.   Alison Murray, MSW, [place] Work [phone]

## 2014-04-14 NOTE — Progress Notes (Signed)
Results for VIHAAN, GLOSS (MRN [id number]) as of 04/14/2014 13:36  Ref. Range 04/13/2014 16:21 04/13/2014 17:52 04/13/2014 22:16 04/14/2014 08:11 04/14/2014 12:19  Glucose-Capillary Latest Range: 70-99 mg/dL 282 (H) 239 (H) 266 (H) 222 (H) 322 (H)   CBGs continue to be greater than 180 mg/dl.   Recommend increasing Lantus to 20 units daily.  Does take 75/25 mixed insulin 10 units BID at home.  Could change insulin  In hospital to 70/30 insulin 10 or 15 units BID if CBGs continue to be greater than 180 mg/dl and patient is eating at least 50% of meals.   Harvel Ricks RN BSN CDE

## 2014-04-14 NOTE — Progress Notes (Signed)
[facility] Therapy Treatment Record Phone [phone]   Radiation Therapy was administered to [name] [name] on: 04/14/2014  1:48 PM and was treatment # 5 out of a planned course of 35 treatments.

## 2014-04-14 NOTE — Progress Notes (Signed)
    Subjective:  Denies CP or dyspnea   Objective:  Filed Vitals:   04/14/14 0300 04/14/14 0400 04/14/14 0500 04/14/14 0600  BP: 115/60 107/55 108/45 105/61  Pulse: 73 78 71 79  Temp:  97.8 F (36.6 C)    TempSrc:  Oral    Resp: 20 19 19 18   Height:      Weight:   203 lb 7.8 oz (92.3 kg)   SpO2: 97% 97% 100% 99%    Intake/Output from previous day:  Intake/Output Summary (Last 24 hours) at 04/14/14 0744 Last data filed at 04/14/14 0520  Gross per 24 hour  Intake    220 ml  Output   1675 ml  Net  -1455 ml    Physical Exam: Physical exam: Well-developed well-nourished in no acute distress.  Skin is warm and dry.  HEENT is normal.  Neck is supple.  Chest mild exp wheeze Cardiovascular exam is regular rate and rhythm.  Abdominal exam nontender or distended. No masses palpated. Extremities show no edema. neuro grossly intact    Lab Results: Basic Metabolic Panel:  Recent Labs  04/13/14 0315 04/14/14 0525  NA 133* 132*  K 3.6* 4.3  CL 88* 92*  CO2 35* 30  GLUCOSE 279* 252*  BUN 32* 19  CREATININE 1.10 0.75  CALCIUM 9.0 9.1  MG 1.7 1.5   CBC:  Recent Labs  04/13/14 0315 04/14/14 0525  WBC 6.3 4.9  NEUTROABS 4.3  --   HGB 12.2* 11.6*  HCT 37.9* 36.5*  MCV 87.3 86.5  PLT 156 151     Assessment/Plan:  1 coronary artery disease-cath results noted. Patient has severe coronary artery disease. As outlined given his lung cancer and multiple comorbidities plan medical therapy. Continue aspirin, statin and metoprolol. Will not add plavix given upcoming chemotherapy and probable thrombocytopenia. 2 paroxysmal atrial fibrillation-patient developed atrial fibrillation with a rapid ventricular response previously but remains in sinus. Continue metoprolol. Continue aspirin. I will avoid anticoagulation at this point given lung cancer and need for chemotherapy. If A. Fib recurs will consider amiodarone. 3 Lung cancer-management per oncology. 4  hyperlipidemia-continue statin. 5 acute diastolic congestive heart failure-much improved. Resume lasix 20 mg daily.   [name] [name] 04/14/2014, 7:44 AM

## 2014-04-14 NOTE — Telephone Encounter (Signed)
Per Dr. [name], [name] is OK to have treatment today.  Per Edd Arbour, RN, Sabastion Is OK to have radiation today.

## 2014-04-14 NOTE — Progress Notes (Signed)
Clinical Social Work Department CLINICAL SOCIAL WORK PLACEMENT NOTE 04/14/2014  Patient:  [name] [name], [name] [name]  Account Number:  [id number] Admit date:  04/10/2014  Clinical Social Worker:  Ulyess Blossom  Date/time:  04/14/2014 04:00 PM  Clinical Social Work is seeking post-discharge placement for this patient at the following level of care:   SKILLED NURSING   (*CSW will update this form in Epic as items are completed)   04/14/2014  Patient/family provided with [place] Department of Clinical Social Work's list of facilities offering this level of care within the geographic area requested by the patient (or if unable, by the patient's family).  04/14/2014  Patient/family informed of their freedom to choose among providers that offer the needed level of care, that participate in Medicare, Medicaid or managed care program needed by the patient, have an available bed and are willing to accept the patient.  04/14/2014  Patient/family informed of [facility]' ownership interest in [facility], as well as of the fact that they are under no obligation to receive care at this facility.  PASARR submitted to EDS on 04/14/2014 PASARR number received on 04/14/2014  FL2 transmitted to all facilities in geographic area requested by pt/family on  04/14/2014 FL2 transmitted to all facilities within larger geographic area on   Patient informed that his/her managed care company has contracts with or will negotiate with  certain facilities, including the following:     Patient/family informed of bed offers received:   Patient chooses bed at  Physician recommends and patient chooses bed at    Patient to be transferred to  on   Patient to be transferred to facility by  Patient and family notified of transfer on  Name of family member notified:    The following physician request were entered in Epic:   Additional Comments:   Alison Murray, MSW, [place] Work [phone]

## 2014-04-14 NOTE — Evaluation (Addendum)
Physical Therapy Evaluation Patient Details Name: [name] [name] MRN: [id number] DOB: 02-19-1933 Today's Date: 04/14/2014   History of Present Illness  80 yo male admitted with PAfib, dyspnea. Hx of lung cancer, DM, CAD, NHL, COPD, neuropathy, substance abuse. Pt lives alone.   Clinical Impression  On eval, pt required Min guard assist for mobility-able to ambulate ~70 feet with RW. Demonstrates general weakness, decreased activity tolerance, and impaired gait and balance. At this time, recommend ST rehab at SNF vs [facility] with 24 hour supervision (at ALF if that will be pt's d/c plan at discharge). At time of eval, pt is not quite able to manage/ mobilize safely alone at home      Follow Up Recommendations SNF vs Supervision/Assistance - 24 hour;Home health PT (depending on progress. Pt states that "they" are working on getting me into an assisted living place)    Equipment Recommendations  None recommended by PT    Recommendations for Other Services OT consult     Precautions / Restrictions Precautions Precautions: Fall Precaution Comments: hx of syncopal episodes Restrictions Weight Bearing Restrictions: No      Mobility  Bed Mobility Overal bed mobility: Modified Independent                Transfers Overall transfer level: Needs assistance Equipment used: Rolling walker (2 wheeled) Transfers: Sit to/from Stand Sit to Stand: Min guard         General transfer comment: close guard for safety  Ambulation/Gait Ambulation/Gait assistance: Min guard Ambulation Distance (Feet): 70 Feet Assistive device: Rolling walker (2 wheeled) Gait Pattern/deviations: Step-through pattern     General Gait Details: very close guard for safety. slightly unsteady but no overt LOB. Fatigues easily. Pt declined to ambulate any farther due to weakness.   Stairs            Wheelchair Mobility    Modified Rankin (Stroke Patients Only)       Balance                                              Pertinent Vitals/Pain Pt denies pain O2 sats 97% on RA. Checked sats at start of session-noted cannula not connected to wall O2 prior to start of PT session    [place] expects to be discharged to:: Private residence Living Arrangements: Alone   Type of Home: Mobile home Home Access: [place]: One [place]: Environmental consultant - 2 wheels;Cane - single point;Wheelchair - manual;Shower seat      Prior Function Level of Independence: Independent               Hand Dominance        Extremity/Trunk Assessment   Upper Extremity Assessment: Overall WFL for tasks assessed           Lower Extremity Assessment: Generalized weakness      Cervical / Trunk Assessment: Normal  Communication   Communication: No difficulties  Cognition Arousal/Alertness: Awake/alert Behavior During Therapy: WFL for tasks assessed/performed Overall Cognitive Status: Within Functional Limits for tasks assessed                      General Comments      Exercises        Assessment/Plan    PT Assessment Patient needs continued PT services  PT Diagnosis Difficulty walking;Generalized weakness   PT Problem List Decreased strength;Decreased activity tolerance;Decreased balance;Decreased mobility;Decreased knowledge of use of DME  PT Treatment Interventions DME instruction;Gait training;Functional mobility training;Therapeutic activities;Therapeutic exercise;Patient/family education;Balance training   PT Goals (Current goals can be found in the Care Plan section) Acute Rehab PT Goals Patient Stated Goal: none stated PT Goal Formulation: With patient Time For Goal Achievement: 04/28/14 Potential to Achieve Goals: Good    Frequency Min 3X/week   Barriers to discharge        Co-evaluation               End of Session Equipment Utilized During Treatment: Gait belt Activity Tolerance:  Patient limited by fatigue Patient left: in bed;with call bell/phone within reach           Time: 7654-6503 PT Time Calculation (min): 28 min   Charges:   PT Evaluation $Initial PT Evaluation Tier I: 1 Procedure PT Treatments $Gait Training: 8-22 mins $Therapeutic Activity: 8-22 mins   PT G Codes:          Weston Anna, MPT Pager: [phone]

## 2014-04-14 NOTE — Progress Notes (Signed)
PROGRESS NOTE  JABARIE POP FKC:[id number] DOB: 1932-12-19 DOA: 04/10/2014 PCP: [name], MD  HPI: 80 y.o.WM PMHx diabetes type 2, CAD, HTN, HLD, PAD, GIB (lower), substance abuse, Non-Hodgkins Lymphoma (2000, 2010) & COPD and Squamous cell carcinoma of the left lung presenting with left upper lobe bronchial obstruction and collapse 04/06/2014. S/P radiation treatment by Dr. [name]. Initially scheduled to start chemotherapy by Dr. [name] (oncology). Recent cardiac workup with relatively normal function ( mod AS) & non-ischemic Nuclear ST.  Presented to [facility] ER with ~24 hr worsening dyspnea with orthopnea. proBNP on eval was 5200 with mild troponin elevation. Sz)2 90% on RA --> 97% on O2 3L. While in the ER he developed acute onset chest pressure.tightness and more significant dyspnea. ECG with worsening LBBB findings with lateral ST depressions concerning for ischemic changes. He also had a short run of NSVT (later noted to be S Tachy with worsened LBBB) given IV Amiodarone bolus & 60mg  IV Lasix + SL NTG.  "Code STEMI" callled - but not true criteria for STEMI - more consistent with Urgent NSTEMI. With new onset CHF & + Troponin with acute anginal chest pain, the decision was made to transfer the patient to [facility] Cath lab for urgent Cardiac Cath on 6/14.   Assessment/Plan:  NSTEMI  - On 6/14 underwent Left Heart Catheterization with Native Coroanry Angiography via Right Radial Artery access; multivessel CAD was diagnosed (see results below), and conservative management was decided upon by family and cardiac team. Patient transferred to [facility] long for additional oncology workup/definitive treatment.  - Continue 81 mg daily  - cardiology consulted  A. fib RVR  - went into A fib 6/16, started cardizem gtt, converted back to sinis this morning. Add metoprolol per cardiology  Non-Hodgkin's lymphoma  - Stable, followed by oncology as an outpatient.   Squamous cell carcinoma lung  -Treatment to  start next week, port placement 6/17, appreciate IR consult.   Diabetes type 2  - A1C 9.2, increase Lantus given elevated fasting this morning - continue sliding scale. Will need insulin at home.   Acute on chronic diastolic heart failure  - continue Lasix, respiratory status improving  Acute hypoxic respiratory failure, present on admission, due to NSTEMI/CHF - improving with diuresis.   HTN  - BP better, continue current regimen.   HLD  -Continue Lipitor 10 mg daily   COPD  -Continue DuoNeb TID  -Flutter valve q 4 hr while awake   Depression  -Continue Cymbalta 30 mg daily  Diet: NPO Fluids: none DVT Prophylaxis: Lovenox  Code Status: Wants to be full code now Family Communication: d/w patient  Disposition Plan: inpatient  Consultants:  Cardiology  Oncology  IR  Procedures:  Cardiac cath 6/14 -Severe multivessel CAD -  Extensive thrombotic stenosis throughout the proximal and mid RCA as well as a focal distal RCA thrombotic lesion; these range from 90-50% with poststenotic dilatation/ectasia,  70% ostial and 80% distal left main disease with significant calcification along with proximal LAD long calcified irregular 50% stenosis   Antibiotics None   HPI/Subjective: - feeling well this morning, no CP/SOB  Objective: Filed Vitals:   04/14/14 0300 04/14/14 0400 04/14/14 0500 04/14/14 0600  BP: 115/60 107/55 108/45 105/61  Pulse: 73 78 71 79  Temp:  97.8 F (36.6 C)    TempSrc:  Oral    Resp: 20 19 19 18   Height:      Weight:   92.3 kg (203 lb 7.8 oz)   SpO2:  97% 97% 100% 99%    Intake/Output Summary (Last 24 hours) at 04/14/14 0732 Last data filed at 04/14/14 0520  Gross per 24 hour  Intake    220 ml  Output   1675 ml  Net  -1455 ml   Filed Weights   04/10/14 1700 04/11/14 0500 04/14/14 0500  Weight: 100 kg (220 lb 7.4 oz) 90.22 kg (198 lb 14.4 oz) 92.3 kg (203 lb 7.8 oz)    Exam:   General:  NAD  Cardiovascular: regular rate and  rhythm, without MRG  Respiratory: good air movement, mild wheezing throughout  Abdomen: soft, not tender to palpation, positive bowel sounds  MSK: no peripheral edema  Neuro: non focal  Data Reviewed: Basic Metabolic Panel:  Recent Labs Lab 04/11/14 0850 04/11/14 1225 04/12/14 0312 04/13/14 0315 04/14/14 0525  NA 140 137 132* 133* 132*  K 4.1 3.6* 4.6 3.6* 4.3  CL 92* 90* 89* 88* 92*  CO2 35* 33* 30 35* 30  GLUCOSE 290* 194* 234* 279* 252*  BUN 16 18 28* 32* 19  CREATININE 0.91 0.97 1.20 1.10 0.75  CALCIUM 9.2 9.3 8.9 9.0 9.1  MG  --   --  1.9 1.7 1.5   Liver Function Tests:  Recent Labs Lab 04/13/14 0315 04/14/14 0525  AST 15 13  ALT 16 11  ALKPHOS 52 53  BILITOT 1.2 1.3*  PROT 6.2 5.9*  ALBUMIN 2.7* 2.6*   CBC:  Recent Labs Lab 04/10/14 1145 04/11/14 0850 04/12/14 0312 04/13/14 0315 04/14/14 0525  WBC 7.3 9.3 7.2 6.3 4.9  NEUTROABS 5.3  --   --  4.3  --   HGB 11.0* 12.3* 11.6* 12.2* 11.6*  HCT 34.3* 38.2* 36.5* 37.9* 36.5*  MCV 87.5 86.8 86.9 87.3 86.5  PLT 166 189 161 156 151   Cardiac Enzymes:  Recent Labs Lab 04/10/14 1145  TROPONINI 0.55*   BNP (last 3 results)  Recent Labs  04/10/14 1145  PROBNP 5244.0*   CBG:  Recent Labs Lab 04/13/14 0804 04/13/14 1252 04/13/14 1621 04/13/14 1752 04/13/14 2216  GLUCAP 236* 223* 282* 239* 266*    Recent Results (from the past 240 hour(s))  MRSA PCR SCREENING     Status: Abnormal   Collection Time    04/10/14  7:45 PM      Result Value Ref Range Status   MRSA by PCR POSITIVE (*) NEGATIVE Final   Comment:            The GeneXpert MRSA Assay (FDA     approved for NASAL specimens     only), is one component of a     comprehensive MRSA colonization     surveillance program. It is not     intended to diagnose MRSA     infection nor to guide or     monitor treatment for     MRSA infections.     RESULT CALLED TO, READ BACK BY AND VERIFIED WITHUlis Rias RN [id number] AT [id number]  SKEEN,P     Studies: Ir Fluoro Guide Cv Line Right  04/13/2014   CLINICAL DATA:  Lung carcinoma, needs access for chemotherapy  EXAM: TUNNELED PORT CATHETER PLACEMENT WITH ULTRASOUND AND FLUOROSCOPIC GUIDANCE  FLUOROSCOPY TIME:  48  seconds  ANESTHESIA/SEDATION: Intravenous Fentanyl and Versed were administered as conscious sedation during continuous cardiorespiratory monitoring by the radiology RN, with a total moderate sedation time of 20 minutes.  TECHNIQUE: The procedure, risks, benefits, and alternatives were explained to  the patient. Questions regarding the procedure were encouraged and answered. The patient understands and consents to the procedure. As antibiotic prophylaxis in the setting of PCR positive MRSA, cefazolin 2 g and vancomycin 1 g were ordered pre-procedure and administered intravenously within one hour of incision. Patency of the right IJ vein was confirmed with ultrasound with image documentation. An appropriate skin site was determined. Skin site was marked. Region was prepped using maximum barrier technique including cap and mask, sterile gown, sterile gloves, large sterile sheet, and Chlorhexidine as cutaneous antisepsis. The region was infiltrated locally with 1% lidocaine.sedationUnder real-time ultrasound guidance, the right IJ vein was accessed with a 21 gauge micropuncture needle; the needle tip within the vein was confirmed with ultrasound image documentation. Needle was exchanged over a 018 guidewire for transitional dilator which allowed passage of the [facility] wire into the IVC. Over this, the transitional dilator was exchanged for a 5 [place] MPA catheter. A small incision was made on the right anterior chest wall and a subcutaneous pocket fashioned. The power-injectable port was positioned and its catheter tunneled to the right IJ dermatotomy site. The MPA catheter was exchanged over an Amplatz wire for a peel-away sheath, through which the port catheter, which had been trimmed  to the appropriate length, was advanced and positioned under fluoroscopy with its tip at the cavoatrial junction. Spot chest radiograph confirms good catheter position and no pneumothorax. The pocket was closed with deep interrupted and subcuticular continuous 3-0 Monocryl sutures. The port was flushed per protocol. The incisions were covered with Dermabond then covered with a sterile dressing. No immediate complication.  IMPRESSION: Technically successful right IJ power-injectable port catheter placement. Ready for routine use.   Electronically Signed   By: Arne Cleveland M.D.   On: 04/13/2014 13:55   Ir US Guide Vasc Access Right  04/13/2014   CLINICAL DATA:  Lung carcinoma, needs access for chemotherapy  EXAM: TUNNELED PORT CATHETER PLACEMENT WITH ULTRASOUND AND FLUOROSCOPIC GUIDANCE  FLUOROSCOPY TIME:  48  seconds  ANESTHESIA/SEDATION: Intravenous Fentanyl and Versed were administered as conscious sedation during continuous cardiorespiratory monitoring by the radiology RN, with a total moderate sedation time of 20 minutes.  TECHNIQUE: The procedure, risks, benefits, and alternatives were explained to the patient. Questions regarding the procedure were encouraged and answered. The patient understands and consents to the procedure. As antibiotic prophylaxis in the setting of PCR positive MRSA, cefazolin 2 g and vancomycin 1 g were ordered pre-procedure and administered intravenously within one hour of incision. Patency of the right IJ vein was confirmed with ultrasound with image documentation. An appropriate skin site was determined. Skin site was marked. Region was prepped using maximum barrier technique including cap and mask, sterile gown, sterile gloves, large sterile sheet, and Chlorhexidine as cutaneous antisepsis. The region was infiltrated locally with 1% lidocaine.sedationUnder real-time ultrasound guidance, the right IJ vein was accessed with a 21 gauge micropuncture needle; the needle tip within  the vein was confirmed with ultrasound image documentation. Needle was exchanged over a 018 guidewire for transitional dilator which allowed passage of the [facility] wire into the IVC. Over this, the transitional dilator was exchanged for a 5 [place] MPA catheter. A small incision was made on the right anterior chest wall and a subcutaneous pocket fashioned. The power-injectable port was positioned and its catheter tunneled to the right IJ dermatotomy site. The MPA catheter was exchanged over an Amplatz wire for a peel-away sheath, through which the port catheter, which had been trimmed to the appropriate  length, was advanced and positioned under fluoroscopy with its tip at the cavoatrial junction. Spot chest radiograph confirms good catheter position and no pneumothorax. The pocket was closed with deep interrupted and subcuticular continuous 3-0 Monocryl sutures. The port was flushed per protocol. The incisions were covered with Dermabond then covered with a sterile dressing. No immediate complication.  IMPRESSION: Technically successful right IJ power-injectable port catheter placement. Ready for routine use.   Electronically Signed   By: Arne Cleveland M.D.   On: 04/13/2014 13:55    Scheduled Meds: . antiseptic oral rinse  15 mL Mouth Rinse q12n4p  . aspirin  81 mg Oral Daily  . atorvastatin  80 mg Oral q1800  . chlorhexidine  15 mL Mouth Rinse BID  . Chlorhexidine Gluconate Cloth  6 each Topical Q0600  . DULoxetine  30 mg Oral Daily  . enoxaparin (LOVENOX) injection  40 mg Subcutaneous Q24H  . finasteride  5 mg Oral Daily  . gabapentin  600 mg Oral TID  . hyaluronate sodium  1 application Topical BID  . insulin aspart  0-20 Units Subcutaneous TID WC  . insulin glargine  15 Units Subcutaneous QHS  . ipratropium-albuterol  3 mL Nebulization TID  . metoprolol tartrate  12.5 mg Oral BID  . mupirocin ointment  1 application Nasal BID  . sodium chloride  3 mL Intravenous Q12H  . tamsulosin  0.4 mg  Oral Daily   Continuous Infusions:    Principal Problem:   Squamous cell carcinoma lung Active Problems:   DIABETES MELLITUS, TYPE II, ON INSULIN   HYPERLIPIDEMIA   HYPERTENSION   CAD   PAD (peripheral artery disease)   AAA (abdominal aortic aneurysm) without rupture   COPD with exacerbation   Non Hodgkin's lymphoma   Lung mass   Lung cancer, upper lobe   NSTEMI (non-ST elevated myocardial infarction)   Depression   Time spent: 25  This note has been created with Surveyor, quantity. Any transcriptional errors are unintentional.   Marzetta Board, MD [facility] Pager [phone]. If 7 PM - 7 AM, please contact night-coverage at [email or web address], password [facility] 04/14/2014, 7:32 AM  LOS: 4 days

## 2014-04-15 ENCOUNTER — Encounter (HOSPITAL_COMMUNITY): Payer: Self-pay | Admitting: *Deleted

## 2014-04-15 ENCOUNTER — Ambulatory Visit
Admission: RE | Admit: 2014-04-15 | Discharge: 2014-04-15 | Disposition: A | Discharge status: Home / Self Care | Payer: Medicare Other | Source: Ambulatory Visit | Attending: Radiation Oncology | Admitting: Radiation Oncology

## 2014-04-15 LAB — BASIC METABOLIC PANEL
BUN: 19 mg/dL (ref 6–23)
CHLORIDE: 89 meq/L — AB (ref 96–112)
CO2: 28 mEq/L (ref 19–32)
Calcium: 9 mg/dL (ref 8.4–10.5)
Creatinine, Ser: 0.69 mg/dL (ref 0.50–1.35)
GFR, EST NON AFRICAN AMERICAN: 88 mL/min — AB (ref >90)
Glucose, Bld: 249 mg/dL — ABNORMAL HIGH (ref 70–99)
Potassium: 4.2 mEq/L (ref 3.7–5.3)
SODIUM: 128 meq/L — AB (ref 137–147)

## 2014-04-15 LAB — CBC
HCT: 36.3 % — ABNORMAL LOW (ref 39.0–52.0)
HEMOGLOBIN: 11.6 g/dL — AB (ref 13.0–17.0)
MCH: 27.9 pg (ref 26.0–34.0)
MCHC: 32 g/dL (ref 30.0–36.0)
MCV: 87.3 fL (ref 78.0–100.0)
Platelets: 156 10*3/uL (ref 150–400)
RBC: 4.16 MIL/uL — ABNORMAL LOW (ref 4.22–5.81)
RDW: 15.4 % (ref 11.5–15.5)
WBC: 5.4 10*3/uL (ref 4.0–10.5)

## 2014-04-15 LAB — GLUCOSE, CAPILLARY
GLUCOSE-CAPILLARY: 246 mg/dL — AB (ref 70–99)
GLUCOSE-CAPILLARY: 293 mg/dL — AB (ref 70–99)
Glucose-Capillary: 190 mg/dL — ABNORMAL HIGH (ref 70–99)

## 2014-04-15 LAB — MAGNESIUM: Magnesium: 1.3 mg/dL — ABNORMAL LOW (ref 1.5–2.5)

## 2014-04-15 MED ORDER — SODIUM CHLORIDE 0.9 % IJ SOLN
10.0000 mL | INTRAMUSCULAR | Status: DC | PRN
  Administered 2014-04-15 – 2014-04-16 (×2): 10 mL

## 2014-04-15 MED ORDER — INSULIN ASPART 100 UNIT/ML ~~LOC~~ SOLN
3.0000 [IU] | Freq: Three times a day (TID) | SUBCUTANEOUS | Status: DC
  Administered 2014-04-15 – 2014-04-16 (×4): 3 [IU] via SUBCUTANEOUS

## 2014-04-15 MED ORDER — METOPROLOL TARTRATE 25 MG PO TABS
25.0000 mg | ORAL_TABLET | Freq: Two times a day (BID) | ORAL | Status: DC
  Administered 2014-04-15 – 2014-04-16 (×3): 25 mg via ORAL
  Filled 2014-04-15 (×4): qty 1

## 2014-04-15 MED ORDER — INSULIN GLARGINE 100 UNIT/ML ~~LOC~~ SOLN
20.0000 [IU] | Freq: Every day | SUBCUTANEOUS | Status: DC
  Administered 2014-04-15: 20 [IU] via SUBCUTANEOUS
  Filled 2014-04-15: qty 0.2

## 2014-04-15 NOTE — Progress Notes (Signed)
CSW met with patient, daughter-in-law, Ginger at bedside conferencing in patient's son - Tony & daughter, Vicki via phone. CSW provided bed offers, they have decided on [facility]. Anticipating discharge Saturday.   Clinical Social Work Department CLINICAL SOCIAL WORK PLACEMENT NOTE 04/15/2014  Patient:  [name] [name],[name] [name]  Account Number:  [id number] Admit date:  04/10/2014  Clinical Social Worker:  SUZANNA BYRD, LCSWA  Date/time:  04/14/2014 04:00 PM  Clinical Social Work is seeking post-discharge placement for this patient at the following level of care:   SKILLED NURSING   (*CSW will update this form in Epic as items are completed)   04/14/2014  Patient/family provided with [facility] Department of Clinical Social Work's list of facilities offering this level of care within the geographic area requested by the patient (or if unable, by the patient's family).  04/14/2014  Patient/family informed of their freedom to choose among providers that offer the needed level of care, that participate in Medicare, Medicaid or managed care program needed by the patient, have an available bed and are willing to accept the patient.  04/14/2014  Patient/family informed of [facility]' ownership interest in [facility], as well as of the fact that they are under no obligation to receive care at this facility.  PASARR submitted to EDS on 04/14/2014 PASARR number received on 04/14/2014  FL2 transmitted to all facilities in geographic area requested by pt/family on  04/14/2014 FL2 transmitted to all facilities within larger geographic area on   Patient informed that his/her managed care company has contracts with or will negotiate with  certain facilities, including the following:     Patient/family informed of bed offers received:  04/15/2014 Patient chooses bed at [facility] [place] Physician recommends and patient chooses bed at    Patient to be  transferred to [facility] [place] on   Patient to be transferred to facility by  Patient and family notified of transfer on  Name of family member notified:    The following physician request were entered in Epic:   Additional Comments:   Kelly Harrison, LCSW [facility] Clinical Social Worker cell #: [phone]  

## 2014-04-15 NOTE — Progress Notes (Signed)
PROGRESS NOTE  [name] [name] ATF:[id number] DOB: [birth year] DOA: 04/10/2014 PCP: [name], MD  HPI: 80 y.o.WM PMHx diabetes type 2, CAD, HTN, HLD, PAD, GIB (lower), substance abuse, Non-Hodgkins Lymphoma (2000, 2010) & COPD and Squamous cell carcinoma of the left lung presenting with left upper lobe bronchial obstruction and collapse 04/06/2014. S/P radiation treatment by Dr. [name]. Initially scheduled to start chemotherapy by Dr. [name] (oncology). Recent cardiac workup with relatively normal function ( mod AS) & non-ischemic Nuclear ST.  Presented to [facility] ER with ~24 hr worsening dyspnea with orthopnea. proBNP on eval was 5200 with mild troponin elevation. Sz)2 90% on RA --> 97% on O2 3L. While in the ER he developed acute onset chest pressure.tightness and more significant dyspnea. ECG with worsening LBBB findings with lateral ST depressions concerning for ischemic changes. He also had a short run of NSVT (later noted to be S Tachy with worsened LBBB) given IV Amiodarone bolus & 60mg  IV Lasix + SL NTG.  "Code STEMI" callled - but not true criteria for STEMI - more consistent with Urgent NSTEMI. With new onset CHF & + Troponin with acute anginal chest pain, the decision was made to transfer the patient to [facility] lab for urgent Cardiac Cath on 6/14.   Assessment/Plan:  NSTEMI  - On 6/14 underwent Left Heart Catheterization with Native Coroanry Angiography via Right Radial Artery access; multivessel CAD was diagnosed (see results below), and conservative management was decided upon by family and cardiac team. Patient transferred to [facility] long for additional oncology workup/definitive treatment.  - Continue 81 mg daily  - cardiology consulted  A. fib RVR  - went into A fib 6/16, started cardizem gtt, converted back to sinis this morning. Add metoprolol per cardiology - remains in sinus rhythm this morning   Non-Hodgkin's lymphoma  - Stable, followed by oncology as an outpatient.    Squamous cell carcinoma lung  -Treatment to start next week, port placement 6/17, appreciate IR consult.   Diabetes type 2  - A1C 9.2 - continue sliding scale. Will need insulin at home.  - increase Lantus today and add scheduled mealtime insulin.   Acute on chronic diastolic heart failure  - continue Lasix, respiratory status improving  Acute hypoxic respiratory failure, present on admission, due to NSTEMI/CHF - improving with diuresis.   HTN  - BP better, continue current regimen.   HLD  -Continue Lipitor 10 mg daily   COPD  -Continue DuoNeb TID  -Flutter valve q 4 hr while awake   Depression  -Continue Cymbalta 30 mg daily  Diet: NPO Fluids: none DVT Prophylaxis: Lovenox  Code Status: Wants to be full code now Family Communication: d/w patient  Disposition Plan: inpatient, potential SNF tomorrow.   Consultants:  Cardiology  Oncology  IR  Procedures:  Cardiac cath 6/14 -Severe multivessel CAD -  Extensive thrombotic stenosis throughout the proximal and mid RCA as well as a focal distal RCA thrombotic lesion; these range from 90-50% with poststenotic dilatation/ectasia,  70% ostial and 80% distal left main disease with significant calcification along with proximal LAD long calcified irregular 50% stenosis   Antibiotics None   HPI/Subjective: - feeling well this morning, no CP/SOB  Objective: Filed Vitals:   04/14/14 1939 04/14/14 2300 04/15/14 0450 04/15/14 0732  BP:  105/60 124/63   Pulse:  96 76   Temp:  98.1 F (36.7 C) 97.5 F (36.4 C)   TempSrc:  Oral Oral   Resp:  20  Height:      Weight:      SpO2: 93% 98% 99% 97%    Intake/Output Summary (Last 24 hours) at 04/15/14 1113 Last data filed at 04/15/14 0600  Gross per 24 hour  Intake    650 ml  Output   1825 ml  Net  -1175 ml   Filed Weights   04/10/14 1700 04/11/14 0500 04/14/14 0500  Weight: 100 kg (220 lb 7.4 oz) 90.22 kg (198 lb 14.4 oz) 92.3 kg (203 lb 7.8 oz)    Exam:  General:  NAD  Cardiovascular: regular rate and rhythm, without MRG  Respiratory: good air movement, mild wheezing throughout  Abdomen: soft, not tender to palpation, positive bowel sounds  MSK: no peripheral edema  Neuro: non focal  Data Reviewed: Basic Metabolic Panel:  Recent Labs Lab 04/11/14 1225 04/12/14 0312 04/13/14 0315 04/14/14 0525 04/15/14 0525  NA 137 132* 133* 132* 128*  K 3.6* 4.6 3.6* 4.3 4.2  CL 90* 89* 88* 92* 89*  CO2 33* 30 35* 30 28  GLUCOSE 194* 234* 279* 252* 249*  BUN 18 28* 32* 19 19  CREATININE 0.97 1.20 1.10 0.75 0.69  CALCIUM 9.3 8.9 9.0 9.1 9.0  MG  --  1.9 1.7 1.5 1.3*   Liver Function Tests:  Recent Labs Lab 04/13/14 0315 04/14/14 0525  AST 15 13  ALT 16 11  ALKPHOS 52 53  BILITOT 1.2 1.3*  PROT 6.2 5.9*  ALBUMIN 2.7* 2.6*   CBC:  Recent Labs Lab 04/10/14 1145 04/11/14 0850 04/12/14 0312 04/13/14 0315 04/14/14 0525 04/15/14 0525  WBC 7.3 9.3 7.2 6.3 4.9 5.4  NEUTROABS 5.3  --   --  4.3  --   --   HGB 11.0* 12.3* 11.6* 12.2* 11.6* 11.6*  HCT 34.3* 38.2* 36.5* 37.9* 36.5* 36.3*  MCV 87.5 86.8 86.9 87.3 86.5 87.3  PLT 166 189 161 156 151 156   Cardiac Enzymes:  Recent Labs Lab 04/10/14 1145  TROPONINI 0.55*   BNP (last 3 results)  Recent Labs  04/10/14 1145  PROBNP 5244.0*   CBG:  Recent Labs Lab 04/14/14 0811 04/14/14 1219 04/14/14 1702 04/14/14 2212 04/15/14 0745  GLUCAP 222* 322* 221* 327* 246*    Recent Results (from the past 240 hour(s))  MRSA PCR SCREENING     Status: Abnormal   Collection Time    04/10/14  7:45 PM      Result Value Ref Range Status   MRSA by PCR POSITIVE (*) NEGATIVE Final   Comment:            The GeneXpert MRSA Assay (FDA     approved for NASAL specimens     only), is one component of a     comprehensive MRSA colonization     surveillance program. It is not     intended to diagnose MRSA     infection nor to guide or     monitor treatment for      MRSA infections.     RESULT CALLED TO, READ BACK BY AND VERIFIED WITHUlis Rias RN [id number] AT 2126 SKEEN,P   Studies: Ir Fluoro Guide Cv Line Right  04/13/2014   CLINICAL DATA:  Lung carcinoma, needs access for chemotherapy  EXAM: TUNNELED PORT CATHETER PLACEMENT WITH ULTRASOUND AND FLUOROSCOPIC GUIDANCE  FLUOROSCOPY TIME:  48  seconds  ANESTHESIA/SEDATION: Intravenous Fentanyl and Versed were administered as conscious sedation during continuous cardiorespiratory monitoring by the radiology RN, with a total  moderate sedation time of 20 minutes.  TECHNIQUE: The procedure, risks, benefits, and alternatives were explained to the patient. Questions regarding the procedure were encouraged and answered. The patient understands and consents to the procedure. As antibiotic prophylaxis in the setting of PCR positive MRSA, cefazolin 2 g and vancomycin 1 g were ordered pre-procedure and administered intravenously within one hour of incision. Patency of the right IJ vein was confirmed with ultrasound with image documentation. An appropriate skin site was determined. Skin site was marked. Region was prepped using maximum barrier technique including cap and mask, sterile gown, sterile gloves, large sterile sheet, and Chlorhexidine as cutaneous antisepsis. The region was infiltrated locally with 1% lidocaine.sedationUnder real-time ultrasound guidance, the right IJ vein was accessed with a 21 gauge micropuncture needle; the needle tip within the vein was confirmed with ultrasound image documentation. Needle was exchanged over a 018 guidewire for transitional dilator which allowed passage of the [facility] wire into the IVC. Over this, the transitional dilator was exchanged for a 5 [place] MPA catheter. A small incision was made on the right anterior chest wall and a subcutaneous pocket fashioned. The power-injectable port was positioned and its catheter tunneled to the right IJ dermatotomy site. The MPA catheter was exchanged  over an Amplatz wire for a peel-away sheath, through which the port catheter, which had been trimmed to the appropriate length, was advanced and positioned under fluoroscopy with its tip at the cavoatrial junction. Spot chest radiograph confirms good catheter position and no pneumothorax. The pocket was closed with deep interrupted and subcuticular continuous 3-0 Monocryl sutures. The port was flushed per protocol. The incisions were covered with Dermabond then covered with a sterile dressing. No immediate complication.  IMPRESSION: Technically successful right IJ power-injectable port catheter placement. Ready for routine use.   Electronically Signed   By: Arne Cleveland M.D.   On: 04/13/2014 13:55   Ir US Guide Vasc Access Right  04/13/2014   CLINICAL DATA:  Lung carcinoma, needs access for chemotherapy  EXAM: TUNNELED PORT CATHETER PLACEMENT WITH ULTRASOUND AND FLUOROSCOPIC GUIDANCE  FLUOROSCOPY TIME:  48  seconds  ANESTHESIA/SEDATION: Intravenous Fentanyl and Versed were administered as conscious sedation during continuous cardiorespiratory monitoring by the radiology RN, with a total moderate sedation time of 20 minutes.  TECHNIQUE: The procedure, risks, benefits, and alternatives were explained to the patient. Questions regarding the procedure were encouraged and answered. The patient understands and consents to the procedure. As antibiotic prophylaxis in the setting of PCR positive MRSA, cefazolin 2 g and vancomycin 1 g were ordered pre-procedure and administered intravenously within one hour of incision. Patency of the right IJ vein was confirmed with ultrasound with image documentation. An appropriate skin site was determined. Skin site was marked. Region was prepped using maximum barrier technique including cap and mask, sterile gown, sterile gloves, large sterile sheet, and Chlorhexidine as cutaneous antisepsis. The region was infiltrated locally with 1% lidocaine.sedationUnder real-time ultrasound  guidance, the right IJ vein was accessed with a 21 gauge micropuncture needle; the needle tip within the vein was confirmed with ultrasound image documentation. Needle was exchanged over a 018 guidewire for transitional dilator which allowed passage of the [facility] wire into the IVC. Over this, the transitional dilator was exchanged for a 5 [place] MPA catheter. A small incision was made on the right anterior chest wall and a subcutaneous pocket fashioned. The power-injectable port was positioned and its catheter tunneled to the right IJ dermatotomy site. The MPA catheter was exchanged over an Amplatz  wire for a peel-away sheath, through which the port catheter, which had been trimmed to the appropriate length, was advanced and positioned under fluoroscopy with its tip at the cavoatrial junction. Spot chest radiograph confirms good catheter position and no pneumothorax. The pocket was closed with deep interrupted and subcuticular continuous 3-0 Monocryl sutures. The port was flushed per protocol. The incisions were covered with Dermabond then covered with a sterile dressing. No immediate complication.  IMPRESSION: Technically successful right IJ power-injectable port catheter placement. Ready for routine use.   Electronically Signed   By: Arne Cleveland M.D.   On: 04/13/2014 13:55   Scheduled Meds: . antiseptic oral rinse  15 mL Mouth Rinse q12n4p  . aspirin  81 mg Oral Daily  . atorvastatin  80 mg Oral q1800  . chlorhexidine  15 mL Mouth Rinse BID  . Chlorhexidine Gluconate Cloth  6 each Topical Q0600  . DULoxetine  30 mg Oral Daily  . enoxaparin (LOVENOX) injection  40 mg Subcutaneous Q24H  . finasteride  5 mg Oral Daily  . gabapentin  600 mg Oral TID  . hyaluronate sodium  1 application Topical BID  . insulin aspart  0-20 Units Subcutaneous TID WC  . insulin aspart  3 Units Subcutaneous TID WC  . insulin glargine  20 Units Subcutaneous QHS  . ipratropium-albuterol  3 mL Nebulization TID  .  metoprolol tartrate  25 mg Oral BID  . mupirocin ointment  1 application Nasal BID  . sodium chloride  3 mL Intravenous Q12H  . tamsulosin  0.4 mg Oral Daily   Continuous Infusions:    Principal Problem:   Squamous cell carcinoma lung Active Problems:   DIABETES MELLITUS, TYPE II, ON INSULIN   HYPERLIPIDEMIA   HYPERTENSION   CAD   PAD (peripheral artery disease)   AAA (abdominal aortic aneurysm) without rupture   COPD with exacerbation   Non Hodgkin's lymphoma   Lung mass   Lung cancer, upper lobe   NSTEMI (non-ST elevated myocardial infarction)   Depression  Time spent: 25  This note has been created with Surveyor, quantity. Any transcriptional errors are unintentional.   Marzetta Board, MD [facility] Pager [phone]. If 7 PM - 7 AM, please contact night-coverage at [email or web address], password [facility] 04/15/2014, 11:13 AM  LOS: 5 days

## 2014-04-15 NOTE — Progress Notes (Signed)
[facility] Therapy Treatment Record Phone [phone]   Radiation Therapy was administered to [name] [name] on: 04/15/2014  1:56 PM and was treatment # 6 out of a planned course of 35 treatments.

## 2014-04-15 NOTE — Progress Notes (Signed)
    Subjective:  Denies CP or dyspnea   Objective:  Filed Vitals:   04/14/14 1430 04/14/14 1939 04/14/14 2300 04/15/14 0450  BP:   105/60 124/63  Pulse:   96 76  Temp:   98.1 F (36.7 C) 97.5 F (36.4 C)  TempSrc:   Oral Oral  Resp:   20   Height:      Weight:      SpO2: 97% 93% 98% 99%    Intake/Output from previous day:  Intake/Output Summary (Last 24 hours) at 04/15/14 0726 Last data filed at 04/15/14 0600  Gross per 24 hour  Intake    670 ml  Output   2395 ml  Net  -1725 ml    Physical Exam: Physical exam: Well-developed well-nourished in no acute distress.  Skin is warm and dry.  HEENT is normal.  Neck is supple.  Chest mild exp wheeze; diminished BS Cardiovascular exam is regular rate and rhythm.  Abdominal exam nontender or distended. No masses palpated. Extremities show no edema. neuro grossly intact    Lab Results: Basic Metabolic Panel:  Recent Labs  04/14/14 0525 04/15/14 0525  NA 132* 128*  K 4.3 4.2  CL 92* 89*  CO2 30 28  GLUCOSE 252* 249*  BUN 19 19  CREATININE 0.75 0.69  CALCIUM 9.1 9.0  MG 1.5 1.3*   CBC:  Recent Labs  04/13/14 0315 04/14/14 0525 04/15/14 0525  WBC 6.3 4.9 5.4  NEUTROABS 4.3  --   --   HGB 12.2* 11.6* 11.6*  HCT 37.9* 36.5* 36.3*  MCV 87.3 86.5 87.3  PLT 156 151 156     Assessment/Plan:  1 coronary artery disease-cath results noted. Patient has severe coronary artery disease. As outlined given his lung cancer and multiple comorbidities plan medical therapy. Continue aspirin, statin and metoprolol. Will not add plavix given upcoming chemotherapy and probable thrombocytopenia. 2 paroxysmal atrial fibrillation-patient developed atrial fibrillation with a rapid ventricular response previously but remains in sinus. Continue metoprolol but increase to 25 mg BID. Continue aspirin. I will avoid anticoagulation at this point given lung cancer and need for chemotherapy. If A. Fib recurs will consider  amiodarone. 3 Lung cancer-management per oncology. 4 hyperlipidemia-continue statin. 5 acute diastolic congestive heart failure-much improved. DC lasix.   Kirk Ruths 04/15/2014, 7:26 AM

## 2014-04-15 NOTE — Progress Notes (Signed)
Physical Therapy Treatment Patient Details Name: [name] [name] MRN: [id number] DOB: 1932/11/27 Today's Date: May 13, 2014    History of Present Illness 80 yo male admitted with PAfib, dyspnea. Hx of lung cancer, DM, CAD, NHL, COPD, neuropathy, substance abuse. Pt lives alone.     PT Comments    Pt ambulated in hallway and upon return to room SpO2 had dropped to 86% room air so 2L O2 [place] reapplied.  Follow Up Recommendations  SNF;Supervision/Assistance - 24 hour     Equipment Recommendations  None recommended by PT    Recommendations for Other Services       Precautions / Restrictions Precautions Precautions: Fall Precaution Comments: hx of syncopal episodes    Mobility  Bed Mobility Overal bed mobility: Modified Independent                Transfers Overall transfer level: Needs assistance Equipment used: Rolling walker (2 wheeled) Transfers: Sit to/from Stand Sit to Stand: Min guard         General transfer comment: close guard for safety  Ambulation/Gait Ambulation/Gait assistance: Min guard Ambulation Distance (Feet): 200 Feet Assistive device: Rolling walker (2 wheeled) Gait Pattern/deviations: Step-through pattern;Trunk flexed     General Gait Details: close guard for safety. no unsteadiness observed today, SpO2 on room air 91% however upon returning to room dropped to 86% so reapplied 2L O2 [place]   Stairs            Wheelchair Mobility    Modified Rankin (Stroke Patients Only)       Balance                                    Cognition                            Exercises      General Comments        Pertinent Vitals/Pain SpO2 room air at rest 96% SpO2 during ambulation room air 91% SpO2 end of ambulation room air 86% SpO2 with 2L O2 and rest break in recliner 90%    Home Living                      Prior Function            PT Goals (current goals can now be found in the care plan  section) Progress towards PT goals: Progressing toward goals    Frequency  Min 3X/week    PT Plan Current plan remains appropriate    Co-evaluation             End of Session   Activity Tolerance: Patient limited by fatigue Patient left: in chair;with call bell/phone within reach (pt to call for assist back to bed)     Time: 6203-5597 PT Time Calculation (min): 17 min  Charges:  $Gait Training: 8-22 mins                    G Codes:      LEMYRE,KATHrine E 05/13/14, 12:51 PM Carmelia Bake, PT, DPT 05-13-14 Pager: [phone]

## 2014-04-16 LAB — CBC
HCT: 36.9 % — ABNORMAL LOW (ref 39.0–52.0)
HEMOGLOBIN: 11.8 g/dL — AB (ref 13.0–17.0)
MCH: 28 pg (ref 26.0–34.0)
MCHC: 32 g/dL (ref 30.0–36.0)
MCV: 87.4 fL (ref 78.0–100.0)
Platelets: 157 10*3/uL (ref 150–400)
RBC: 4.22 MIL/uL (ref 4.22–5.81)
RDW: 15.2 % (ref 11.5–15.5)
WBC: 5.3 10*3/uL (ref 4.0–10.5)

## 2014-04-16 LAB — BASIC METABOLIC PANEL
BUN: 19 mg/dL (ref 6–23)
CALCIUM: 9.8 mg/dL (ref 8.4–10.5)
CO2: 28 meq/L (ref 19–32)
CREATININE: 0.76 mg/dL (ref 0.50–1.35)
Chloride: 94 mEq/L — ABNORMAL LOW (ref 96–112)
GFR calc Af Amer: >90 mL/min (ref >90)
GFR calc non Af Amer: 85 mL/min — ABNORMAL LOW (ref >90)
Glucose, Bld: 257 mg/dL — ABNORMAL HIGH (ref 70–99)
Potassium: 4.6 mEq/L (ref 3.7–5.3)
Sodium: 133 mEq/L — ABNORMAL LOW (ref 137–147)

## 2014-04-16 LAB — MAGNESIUM: Magnesium: 1.4 mg/dL — ABNORMAL LOW (ref 1.5–2.5)

## 2014-04-16 LAB — GLUCOSE, CAPILLARY
Glucose-Capillary: 232 mg/dL — ABNORMAL HIGH (ref 70–99)
Glucose-Capillary: 343 mg/dL — ABNORMAL HIGH (ref 70–99)

## 2014-04-16 MED ORDER — INSULIN ASPART 100 UNIT/ML ~~LOC~~ SOLN: 0.0000 [IU] | Freq: Three times a day (TID) | SUBCUTANEOUS | Status: DC

## 2014-04-16 MED ORDER — HEPARIN SOD (PORK) LOCK FLUSH 100 UNIT/ML IV SOLN
500.0000 [IU] | INTRAVENOUS | Status: AC | PRN
  Administered 2014-04-16: 500 [IU]

## 2014-04-16 MED ORDER — INSULIN GLARGINE 100 UNIT/ML ~~LOC~~ SOLN: 22.0000 [IU] | Freq: Every day | SUBCUTANEOUS | Status: DC

## 2014-04-16 MED ORDER — OXYCODONE-ACETAMINOPHEN 5-325 MG PO TABS: 1.0000 | ORAL_TABLET | ORAL | Status: DC | PRN

## 2014-04-16 MED ORDER — FUROSEMIDE 20 MG PO TABS: 20.0000 mg | ORAL_TABLET | Freq: Every day | ORAL | Status: DC | PRN

## 2014-04-16 MED ORDER — INSULIN ASPART 100 UNIT/ML ~~LOC~~ SOLN: 3.0000 [IU] | Freq: Three times a day (TID) | SUBCUTANEOUS | Status: DC

## 2014-04-16 MED ORDER — METOPROLOL TARTRATE 25 MG PO TABS: 25.0000 mg | ORAL_TABLET | Freq: Two times a day (BID) | ORAL | Status: DC

## 2014-04-16 NOTE — Progress Notes (Signed)
Per MD, Pt ready for d/c.  Notified RN, Pt, family and facility.  Sent d/c summary and FL2.  Confirmed receipt of d/c summary and FL2.  Facility ready to receive Pt.  Arranged for transportation.  Bernita Raisin, Laurie Work [phone]

## 2014-04-16 NOTE — Progress Notes (Signed)
    Subjective:  Denies CP or dyspnea   Objective:  Filed Vitals:   04/15/14 0732 04/15/14 2029 04/15/14 2047 04/16/14 0550  BP:   90/69 103/60  Pulse:   75 82  Temp:   97.9 F (36.6 C) 97.5 F (36.4 C)  TempSrc:   Oral Oral  Resp:   18 18  Height:      Weight:    197 lb 12.8 oz (89.721 kg)  SpO2: 97% 96% 97% 98%    Intake/Output from previous day:  Intake/Output Summary (Last 24 hours) at 04/16/14 0717 Last data filed at 04/16/14 0532  Gross per 24 hour  Intake    600 ml  Output   2775 ml  Net  -2175 ml    Physical Exam: Physical exam: Well-developed well-nourished in no acute distress.  Skin is warm and dry.  HEENT is normal.  Neck is supple.  Chest mild exp wheeze; diminished BS Cardiovascular exam is regular rate and rhythm.  Abdominal exam nontender or distended. No masses palpated. Extremities show no edema. neuro grossly intact    Lab Results: Basic Metabolic Panel:  Recent Labs  04/15/14 0525 04/16/14 0508  NA 128* 133*  K 4.2 4.6  CL 89* 94*  CO2 28 28  GLUCOSE 249* 257*  BUN 19 19  CREATININE 0.69 0.76  CALCIUM 9.0 9.8  MG 1.3* 1.4*   CBC:  Recent Labs  04/15/14 0525 04/16/14 0508  WBC 5.4 5.3  HGB 11.6* 11.8*  HCT 36.3* 36.9*  MCV 87.3 87.4  PLT 156 157     Assessment/Plan:  1 coronary artery disease-cath results noted. Patient has severe coronary artery disease. As outlined given his lung cancer and multiple comorbidities plan medical therapy. Continue aspirin, statin and metoprolol. Will not add plavix given upcoming chemotherapy and probable thrombocytopenia. 2 paroxysmal atrial fibrillation-patient developed atrial fibrillation with a rapid ventricular response previously but remains in sinus on exam. Continue metoprolol 25 mg BID. Continue aspirin. I will avoid anticoagulation at this point given lung cancer and need for chemotherapy. If A. Fib recurs will consider amiodarone. 3 Lung cancer-management per oncology. 4  hyperlipidemia-continue statin. 5 acute diastolic congestive heart failure-much improved. We will sign off. Please have pt fu with Dr Sallyanne Kuster following DC.   Kirk Ruths 04/16/2014, 7:17 AM

## 2014-04-16 NOTE — Discharge Summary (Signed)
Physician Discharge Summary  [name] [name] YPP:[id number] DOB: [birth year] DOA: 04/10/2014  PCP: [name], MD  Admit date: 04/10/2014 Discharge date: 04/16/2014  Time spent: 35 minutes  Recommendations for Outpatient Follow-up:  1. Follow up with Oncology and Radiation Oncology on Monday as scheduled.   2. Follow up with Cardiology in 2-3 weeks  Discharge Diagnoses:  Principal Problem:   Squamous cell carcinoma lung Active Problems:   DIABETES MELLITUS, TYPE II, ON INSULIN   HYPERLIPIDEMIA   HYPERTENSION   CAD   PAD (peripheral artery disease)   AAA (abdominal aortic aneurysm) without rupture   COPD with exacerbation   Non Hodgkin's lymphoma   Lung mass   Lung cancer, upper lobe   NSTEMI (non-ST elevated myocardial infarction)   Depression  Discharge Condition: stable  Diet recommendation: heart healthy, carb modified   Filed Weights   04/11/14 0500 04/14/14 0500 04/16/14 0550  Weight: 90.22 kg (198 lb 14.4 oz) 92.3 kg (203 lb 7.8 oz) 89.721 kg (197 lb 12.8 oz)   History of present illness:  80 y.o.WM PMHx diabetes type 2, CAD, HTN, HLD, PAD, GIB (lower), substance abuse, Non-Hodgkins Lymphoma (2000, 2010) & COPD and Squamous cell carcinoma of the left lung presenting with left upper lobe bronchial obstruction and collapse 04/06/2014. S/P radiation treatment by Dr. [name]. Initially scheduled to start chemotherapy by Dr. [name] (oncology). Recent cardiac workup with relatively normal function ( mod AS) & non-ischemic Nuclear ST. Presented to [facility] ER with ~24 hr worsening dyspnea with orthopnea. proBNP on eval was 5200 with mild troponin elevation. Sz)2 90% on RA --> 97% on O2 3L. While in the ER he developed acute onset chest pressure.tightness and more significant dyspnea. ECG with worsening LBBB findings with lateral ST depressions concerning for ischemic changes. He also had a short run of NSVT (later noted to be S Tachy with worsened LBBB) given IV Amiodarone bolus & 60mg   IV Lasix + SL NTG. "Code STEMI" callled - but not true criteria for STEMI - more consistent with Urgent NSTEMI. With new onset CHF & + Troponin with acute anginal chest pain, the decision was made to transfer the patient to [facility] Cath lab for urgent Cardiac Cath on 6/14.   Hospital Course:  NSTEMI - On 6/14 underwent Left Heart Catheterization with Native Coroanry Angiography via Right Radial Artery access; multivessel CAD was diagnosed (see results below), and conservative management was decided upon by family and cardiac team. Patient then transferred to [facility] long for additional oncology workup/definitive treatment. Cardiology followed patient while hospitalized and will continue medical management for now. He is chest pain free.  Continue Aspirin 81 mg daily. A. fib RVR - went into A fib 6/16, started cardizem gtt, converted back to sinus rhythm shortly. His metoprolol was titrated and patient has remained in sinus rhythm. He is not a candidate for anticoagulation currently per cardiology. If his A fib recurs, might benefit from Amiodarone.   Non-Hodgkin's lymphoma - Stable, followed by oncology as an outpatient.  Squamous cell carcinoma lung -Treatment to start next week, port placement 6/17, appreciate IR consult.  Diabetes type 2 - A1C 9.2, insulin regimen changed to Lantus 22U nightly, Lispro 3U TID plus sliding scale. Please continue to monitor CBGs and titrate insulin as indicated.  Acute on chronic diastolic heart failure - continue Lasix as PRN, respiratory status improving, please monitor for signs of fluid overload, recommend daily weights.  Acute hypoxic respiratory failure, present on admission, due to NSTEMI/CHF, oxygen as needed.  HTN - BP better, continue current regimen.  HLD -Continue Lipitor 10 mg daily  COPD -Continue DuoNeb TID  Depression -Continue Cymbalta 30 mg daily  Procedures:  Cardiac cath 6/14 -Severe multivessel CAD - Extensive thrombotic stenosis throughout the  proximal and mid RCA as well as a focal distal RCA thrombotic lesion; these range from 90-50% with poststenotic dilatation/ectasia, 70% ostial and 80% distal left main disease with significant calcification along with proximal LAD long calcified irregular 50% stenosis  Consultations:  Cardiology   Oncology  Radiation Oncology   Interventional Radiology   Discharge Exam: Filed Vitals:   04/15/14 0732 04/15/14 2029 04/15/14 2047 04/16/14 0550  BP:   90/69 103/60  Pulse:   75 82  Temp:   97.9 F (36.6 C) 97.5 F (36.4 C)  TempSrc:   Oral Oral  Resp:   18 18  Height:      Weight:    89.721 kg (197 lb 12.8 oz)  SpO2: 97% 96% 97% 98%    General: NAD Cardiovascular: RRR Respiratory: CTA biL  Discharge Instructions    Medication List    STOP taking these medications       APIDRA SOLOSTAR 100 UNIT/ML Solostar Pen  Generic drug:  Insulin Glulisine     doxycycline 100 MG tablet  Commonly known as:  VIBRA-TABS     Insulin Lispro Prot & Lispro (75-25) 100 UNIT/ML Kwikpen  Commonly known as:  HUMALOG MIX 75/25 KWIKPEN     metFORMIN 1000 MG tablet  Commonly known as:  GLUCOPHAGE     predniSONE 10 MG tablet  Commonly known as:  DELTASONE      TAKE these medications       aspirin 81 MG tablet  Take 160 mg by mouth 2 (two) times daily.     CINNAMON PO  Take 1,000 mg by mouth 2 (two) times daily.     DULoxetine 30 MG capsule  Commonly known as:  CYMBALTA  Take 30 mg by mouth daily.     fenofibrate micronized 200 MG capsule  Commonly known as:  LOFIBRA  Take 200 mg by mouth daily before breakfast.     finasteride 5 MG tablet  Commonly known as:  PROSCAR  Take 5 mg by mouth daily.     furosemide 20 MG tablet  Commonly known as:  LASIX  Take 1 tablet (20 mg total) by mouth daily as needed for fluid or edema.     gabapentin 600 MG tablet  Commonly known as:  NEURONTIN  Take 600 mg by mouth 3 (three) times daily.     glucosamine-chondroitin 500-400 MG tablet    Take 1 tablet by mouth 2 (two) times daily.     hyaluronate sodium Gel  Apply 1 application topically 2 (two) times daily.     insulin aspart 100 UNIT/ML injection  Commonly known as:  novoLOG  Inject 3 Units into the skin 3 (three) times daily with meals.     insulin aspart 100 UNIT/ML injection  Commonly known as:  novoLOG  Inject 0-20 Units into the skin 3 (three) times daily with meals.     insulin glargine 100 UNIT/ML injection  Commonly known as:  LANTUS  Inject 0.22 mLs (22 Units total) into the skin at bedtime.     Ipratropium-Albuterol 20-100 MCG/ACT Aers respimat  Commonly known as:  COMBIVENT  Inhale 1 puff into the lungs every 6 (six) hours. As needed for wheezing-     ipratropium-albuterol 0.5-2.5 (3) MG/3ML Soln  Commonly known as:  DUONEB  Take 3 mLs by nebulization 3 (three) times daily.     lidocaine-prilocaine cream  Commonly known as:  EMLA  Apply 1 application topically as needed. Apply 1-2 hours prior to stick and cover with plastic wrap     metoprolol tartrate 25 MG tablet  Commonly known as:  LOPRESSOR  Take 1 tablet (25 mg total) by mouth 2 (two) times daily.     nitroGLYCERIN 0.4 MG/SPRAY spray  Commonly known as:  NITROLINGUAL  Place 1 spray under the tongue every 5 (five) minutes x 3 doses as needed for chest pain.     oxyCODONE-acetaminophen 5-325 MG per tablet  Commonly known as:  PERCOCET/ROXICET  Take 1 tablet by mouth every 4 (four) hours as needed for severe pain.     prochlorperazine 5 MG tablet  Commonly known as:  COMPAZINE  Take 1 tablet (5 mg total) by mouth every 6 (six) hours as needed for nausea or vomiting.     rosuvastatin 10 MG tablet  Commonly known as:  CRESTOR  Take 10 mg by mouth at bedtime.     tamsulosin 0.4 MG Caps capsule  Commonly known as:  FLOMAX  Take 0.4 mg by mouth daily.     TYLENOL ARTHRITIS PAIN PO  Take 650 mg by mouth 2 (two) times daily.        The results of significant diagnostics from this  hospitalization (including imaging, microbiology, ancillary and laboratory) are listed below for reference.    Significant Diagnostic Studies: Dg Chest 2 View  04/10/2014   CLINICAL DATA:  Shortness of breath. History of lung cancer. 04/06/2014  EXAM: CHEST  2 VIEW  COMPARISON:  None.  FINDINGS: Normal heart size. a calcified atherosclerotic disease involves the thoracic aorta. Left perihilar mass and atelectasis of the left upper lobe is again noted. There are small bilateral pleural effusions and interstitial edema consistent with CHF.  IMPRESSION: 1. Mild CHF.   Electronically Signed   By: Kerby Moors M.D.   On: 04/10/2014 11:39   Dg Chest 2 View  04/06/2014   CLINICAL DATA:  copd  EXAM: CHEST  2 VIEW  COMPARISON:  Portable chest 03/21/2014  FINDINGS: Stable left perihilar density.  Decreased conspicuity of the hazy density in the left hemithorax. Linear areas of density identified within the left lung base. No further focal regions of consolidation or focal infiltrates. Right hemi thorax unremarkable. Atherosclerotic calcifications in the aorta. Calcified mitral valve annulus and calcifications within the aortic arch. Degenerative changes, mild right shoulder.  IMPRESSION: Decreased hazy density left hemi thorax possibly reflecting an improved left pleural effusion. No new focal regions of consolidation or new focal infiltrates.   Electronically Signed   By: Margaree Mackintosh M.D.   On: 04/06/2014 17:31   Ct Head Wo Contrast  03/21/2014   CLINICAL DATA:  Fall, scalp hematoma  EXAM: CT HEAD WITHOUT CONTRAST  TECHNIQUE: Contiguous axial images were obtained from the base of the skull through the vertex without intravenous contrast.  COMPARISON:  CT PET scan 02/20/2009  FINDINGS: No skull fracture is noted. Paranasal sinuses and mastoid air cells are unremarkable. Mild cerebral atrophy again noted. Atherosclerotic calcifications of carotid siphon. No intracranial hemorrhage, mass effect or midline shift.  No acute cortical infarction. No mass lesion is noted on this unenhanced scan.  IMPRESSION: No acute intracranial abnormality.  Mild cerebral atrophy.   Electronically Signed   By: Lahoma Crocker M.D.   On: 03/21/2014 15:41  Ir Fluoro Guide Cv Line Right  04/13/2014   CLINICAL DATA:  Lung carcinoma, needs access for chemotherapy  EXAM: TUNNELED PORT CATHETER PLACEMENT WITH ULTRASOUND AND FLUOROSCOPIC GUIDANCE  FLUOROSCOPY TIME:  48  seconds  ANESTHESIA/SEDATION: Intravenous Fentanyl and Versed were administered as conscious sedation during continuous cardiorespiratory monitoring by the radiology RN, with a total moderate sedation time of 20 minutes.  TECHNIQUE: The procedure, risks, benefits, and alternatives were explained to the patient. Questions regarding the procedure were encouraged and answered. The patient understands and consents to the procedure. As antibiotic prophylaxis in the setting of PCR positive MRSA, cefazolin 2 g and vancomycin 1 g were ordered pre-procedure and administered intravenously within one hour of incision. Patency of the right IJ vein was confirmed with ultrasound with image documentation. An appropriate skin site was determined. Skin site was marked. Region was prepped using maximum barrier technique including cap and mask, sterile gown, sterile gloves, large sterile sheet, and Chlorhexidine as cutaneous antisepsis. The region was infiltrated locally with 1% lidocaine.sedationUnder real-time ultrasound guidance, the right IJ vein was accessed with a 21 gauge micropuncture needle; the needle tip within the vein was confirmed with ultrasound image documentation. Needle was exchanged over a 018 guidewire for transitional dilator which allowed passage of the [facility] wire into the IVC. Over this, the transitional dilator was exchanged for a 5 [place] MPA catheter. A small incision was made on the right anterior chest wall and a subcutaneous pocket fashioned. The power-injectable port was  positioned and its catheter tunneled to the right IJ dermatotomy site. The MPA catheter was exchanged over an Amplatz wire for a peel-away sheath, through which the port catheter, which had been trimmed to the appropriate length, was advanced and positioned under fluoroscopy with its tip at the cavoatrial junction. Spot chest radiograph confirms good catheter position and no pneumothorax. The pocket was closed with deep interrupted and subcuticular continuous 3-0 Monocryl sutures. The port was flushed per protocol. The incisions were covered with Dermabond then covered with a sterile dressing. No immediate complication.  IMPRESSION: Technically successful right IJ power-injectable port catheter placement. Ready for routine use.   Electronically Signed   By: Arne Cleveland M.D.   On: 04/13/2014 13:55   Ir US Guide Vasc Access Right  04/13/2014   CLINICAL DATA:  Lung carcinoma, needs access for chemotherapy  EXAM: TUNNELED PORT CATHETER PLACEMENT WITH ULTRASOUND AND FLUOROSCOPIC GUIDANCE  FLUOROSCOPY TIME:  48  seconds  ANESTHESIA/SEDATION: Intravenous Fentanyl and Versed were administered as conscious sedation during continuous cardiorespiratory monitoring by the radiology RN, with a total moderate sedation time of 20 minutes.  TECHNIQUE: The procedure, risks, benefits, and alternatives were explained to the patient. Questions regarding the procedure were encouraged and answered. The patient understands and consents to the procedure. As antibiotic prophylaxis in the setting of PCR positive MRSA, cefazolin 2 g and vancomycin 1 g were ordered pre-procedure and administered intravenously within one hour of incision. Patency of the right IJ vein was confirmed with ultrasound with image documentation. An appropriate skin site was determined. Skin site was marked. Region was prepped using maximum barrier technique including cap and mask, sterile gown, sterile gloves, large sterile sheet, and Chlorhexidine as cutaneous  antisepsis. The region was infiltrated locally with 1% lidocaine.sedationUnder real-time ultrasound guidance, the right IJ vein was accessed with a 21 gauge micropuncture needle; the needle tip within the vein was confirmed with ultrasound image documentation. Needle was exchanged over a 018 guidewire for transitional dilator which allowed passage  of the [facility] wire into the IVC. Over this, the transitional dilator was exchanged for a 5 [place] MPA catheter. A small incision was made on the right anterior chest wall and a subcutaneous pocket fashioned. The power-injectable port was positioned and its catheter tunneled to the right IJ dermatotomy site. The MPA catheter was exchanged over an Amplatz wire for a peel-away sheath, through which the port catheter, which had been trimmed to the appropriate length, was advanced and positioned under fluoroscopy with its tip at the cavoatrial junction. Spot chest radiograph confirms good catheter position and no pneumothorax. The pocket was closed with deep interrupted and subcuticular continuous 3-0 Monocryl sutures. The port was flushed per protocol. The incisions were covered with Dermabond then covered with a sterile dressing. No immediate complication.  IMPRESSION: Technically successful right IJ power-injectable port catheter placement. Ready for routine use.   Electronically Signed   By: Arne Cleveland M.D.   On: 04/13/2014 13:55   Dg Chest Portable 1 View  04/10/2014   CLINICAL DATA:  Shortness of breath.  EXAM: PORTABLE CHEST - 1 VIEW  COMPARISON:  04/10/2014.  FINDINGS: Cardiomegaly with pulmonary venous congestion and bilateral interstitial prominence noted consistent with congestive heart failure with pulmonary edema. Persistent large left upper lung/perihilar mass noted. Patient has pacer pad over the chest. No pneumothorax. No acute bony abnormality .  IMPRESSION: 1. Congestive heart failure or from interstitial edema. 2. Persistent large left upper  lobe/perihilar mass.   Electronically Signed   By: Marcello Moores  Register   On: 04/10/2014 13:15   Dg Chest Portable 1 View  03/21/2014   CLINICAL DATA:  80 year old male with shortness of breath following fall.  EXAM: PORTABLE CHEST - 1 VIEW  COMPARISON:  03/14/2014 chest radiograph and CT.  FINDINGS: A left perihilar mass is again noted and relatively unchanged.  New haziness overlying the left hemi thorax may represent a layering effusion.  There is no evidence of pneumothorax or right pleural effusion.  The right lung is clear.  No acute bony abnormalities are identified.  IMPRESSION: New haziness overlying the left hemi thorax which could represent a layering pleural effusion.  Unchanged right perihilar mass.   Electronically Signed   By: Hassan Rowan M.D.   On: 03/21/2014 15:18    Microbiology: Recent Results (from the past 240 hour(s))  MRSA PCR SCREENING     Status: Abnormal   Collection Time    04/10/14  7:45 PM      Result Value Ref Range Status   MRSA by PCR POSITIVE (*) NEGATIVE Final   Comment:            The GeneXpert MRSA Assay (FDA     approved for NASAL specimens     only), is one component of a     comprehensive MRSA colonization     surveillance program. It is not     intended to diagnose MRSA     infection nor to guide or     monitor treatment for     MRSA infections.     RESULT CALLED TO, READ BACK BY AND VERIFIED WITH:     [facility] RN [id number] AT 2126 SKEEN,P     Labs: Basic Metabolic Panel:  Recent Labs Lab 04/12/14 0312 04/13/14 0315 04/14/14 0525 04/15/14 0525 04/16/14 0508  NA 132* 133* 132* 128* 133*  K 4.6 3.6* 4.3 4.2 4.6  CL 89* 88* 92* 89* 94*  CO2 30 35* 30 28 28   GLUCOSE 234* 279*  252* 249* 257*  BUN 28* 32* 19 19 19   CREATININE 1.20 1.10 0.75 0.69 0.76  CALCIUM 8.9 9.0 9.1 9.0 9.8  MG 1.9 1.7 1.5 1.3* 1.4*   Liver Function Tests:  Recent Labs Lab 04/13/14 0315 04/14/14 0525  AST 15 13  ALT 16 11  ALKPHOS 52 53  BILITOT 1.2 1.3*  PROT 6.2  5.9*  ALBUMIN 2.7* 2.6*   No results found for this basename: LIPASE, AMYLASE,  in the last 168 hours No results found for this basename: AMMONIA,  in the last 168 hours CBC:  Recent Labs Lab 04/10/14 1145  04/12/14 0312 04/13/14 0315 04/14/14 0525 04/15/14 0525 04/16/14 0508  WBC 7.3  < > 7.2 6.3 4.9 5.4 5.3  NEUTROABS 5.3  --   --  4.3  --   --   --   HGB 11.0*  < > 11.6* 12.2* 11.6* 11.6* 11.8*  HCT 34.3*  < > 36.5* 37.9* 36.5* 36.3* 36.9*  MCV 87.5  < > 86.9 87.3 86.5 87.3 87.4  PLT 166  < > 161 156 151 156 157  < > = values in this interval not displayed. Cardiac Enzymes:  Recent Labs Lab 04/10/14 1145  TROPONINI 0.55*   BNP: BNP (last 3 results)  Recent Labs  04/10/14 1145  PROBNP 5244.0*   CBG:  Recent Labs Lab 04/14/14 2212 04/15/14 0745 04/15/14 1229 04/15/14 1649 04/16/14 0755  GLUCAP 327* 246* 293* 190* 232*   Signed:  Marzetta Board  [facility] 04/16/2014, 8:21 AM

## 2014-04-17 ENCOUNTER — Other Ambulatory Visit: Payer: Self-pay | Admitting: Oncology

## 2014-04-18 ENCOUNTER — Telehealth: Payer: Self-pay | Admitting: Oncology

## 2014-04-18 ENCOUNTER — Other Ambulatory Visit: Payer: Medicare Other

## 2014-04-18 ENCOUNTER — Ambulatory Visit (HOSPITAL_BASED_OUTPATIENT_CLINIC_OR_DEPARTMENT_OTHER): Payer: Medicare Other | Admitting: Oncology

## 2014-04-18 ENCOUNTER — Ambulatory Visit
Admission: RE | Admit: 2014-04-18 | Discharge: 2014-04-18 | Disposition: A | Discharge status: Home / Self Care | Payer: Medicare Other | Source: Ambulatory Visit | Attending: Radiation Oncology | Admitting: Radiation Oncology

## 2014-04-18 ENCOUNTER — Ambulatory Visit (HOSPITAL_BASED_OUTPATIENT_CLINIC_OR_DEPARTMENT_OTHER): Payer: Medicare Other

## 2014-04-18 VITALS — BP 115/58 | HR 64 | Temp 97.7°F | Resp 20 | Ht 71.0 in | Wt 203.3 lb

## 2014-04-18 VITALS — BP 102/66 | HR 79 | Temp 98.0°F | Resp 18

## 2014-04-18 DIAGNOSIS — Z510 Encounter for antineoplastic radiation therapy: Secondary | ICD-10-CM | POA: Diagnosis not present

## 2014-04-18 DIAGNOSIS — Z5111 Encounter for antineoplastic chemotherapy: Secondary | ICD-10-CM

## 2014-04-18 DIAGNOSIS — Z794 Long term (current) use of insulin: Secondary | ICD-10-CM | POA: Diagnosis not present

## 2014-04-18 DIAGNOSIS — R0689 Other abnormalities of breathing: Principal | ICD-10-CM

## 2014-04-18 DIAGNOSIS — Z87898 Personal history of other specified conditions: Secondary | ICD-10-CM

## 2014-04-18 DIAGNOSIS — R042 Hemoptysis: Secondary | ICD-10-CM | POA: Diagnosis not present

## 2014-04-18 DIAGNOSIS — G609 Hereditary and idiopathic neuropathy, unspecified: Secondary | ICD-10-CM | POA: Diagnosis not present

## 2014-04-18 DIAGNOSIS — Z7982 Long term (current) use of aspirin: Secondary | ICD-10-CM | POA: Diagnosis not present

## 2014-04-18 DIAGNOSIS — I2510 Atherosclerotic heart disease of native coronary artery without angina pectoris: Secondary | ICD-10-CM | POA: Diagnosis not present

## 2014-04-18 DIAGNOSIS — E785 Hyperlipidemia, unspecified: Secondary | ICD-10-CM | POA: Diagnosis not present

## 2014-04-18 DIAGNOSIS — C3410 Malignant neoplasm of upper lobe, unspecified bronchus or lung: Secondary | ICD-10-CM | POA: Diagnosis not present

## 2014-04-18 DIAGNOSIS — J449 Chronic obstructive pulmonary disease, unspecified: Secondary | ICD-10-CM | POA: Diagnosis not present

## 2014-04-18 DIAGNOSIS — C3412 Malignant neoplasm of upper lobe, left bronchus or lung: Secondary | ICD-10-CM

## 2014-04-18 DIAGNOSIS — Z87891 Personal history of nicotine dependence: Secondary | ICD-10-CM | POA: Diagnosis not present

## 2014-04-18 DIAGNOSIS — C3490 Malignant neoplasm of unspecified part of unspecified bronchus or lung: Secondary | ICD-10-CM

## 2014-04-18 DIAGNOSIS — I739 Peripheral vascular disease, unspecified: Secondary | ICD-10-CM | POA: Diagnosis not present

## 2014-04-18 DIAGNOSIS — M25519 Pain in unspecified shoulder: Secondary | ICD-10-CM | POA: Diagnosis not present

## 2014-04-18 DIAGNOSIS — R0600 Dyspnea, unspecified: Secondary | ICD-10-CM

## 2014-04-18 DIAGNOSIS — I10 Essential (primary) hypertension: Secondary | ICD-10-CM | POA: Diagnosis not present

## 2014-04-18 DIAGNOSIS — E119 Type 2 diabetes mellitus without complications: Secondary | ICD-10-CM | POA: Diagnosis not present

## 2014-04-18 DIAGNOSIS — C8589 Other specified types of non-Hodgkin lymphoma, extranodal and solid organ sites: Secondary | ICD-10-CM | POA: Diagnosis not present

## 2014-04-18 DIAGNOSIS — G589 Mononeuropathy, unspecified: Secondary | ICD-10-CM

## 2014-04-18 DIAGNOSIS — N189 Chronic kidney disease, unspecified: Secondary | ICD-10-CM

## 2014-04-18 DIAGNOSIS — Z79899 Other long term (current) drug therapy: Secondary | ICD-10-CM | POA: Diagnosis not present

## 2014-04-18 LAB — CBC WITH DIFFERENTIAL/PLATELET
BASO%: 0.2 % (ref 0.0–2.0)
BASOS ABS: 0 10*3/uL (ref 0.0–0.1)
EOS ABS: 0.1 10*3/uL (ref 0.0–0.5)
EOS%: 2.1 % (ref 0.0–7.0)
HEMATOCRIT: 39 % (ref 38.4–49.9)
HEMOGLOBIN: 12.8 g/dL — AB (ref 13.0–17.1)
LYMPH%: 18.3 % (ref 14.0–49.0)
MCH: 28.4 pg (ref 27.2–33.4)
MCHC: 32.8 g/dL (ref 32.0–36.0)
MCV: 86.5 fL (ref 79.3–98.0)
MONO#: 1 10*3/uL — ABNORMAL HIGH (ref 0.1–0.9)
MONO%: 20.8 % — ABNORMAL HIGH (ref 0.0–14.0)
NEUT%: 58.6 % (ref 39.0–75.0)
NEUTROS ABS: 2.8 10*3/uL (ref 1.5–6.5)
Platelets: 165 10*3/uL (ref 140–400)
RBC: 4.51 10*6/uL (ref 4.20–5.82)
RDW: 15.5 % — ABNORMAL HIGH (ref 11.0–14.6)
WBC: 4.7 10*3/uL (ref 4.0–10.3)
lymph#: 0.9 10*3/uL (ref 0.9–3.3)

## 2014-04-18 MED ORDER — DIPHENHYDRAMINE HCL 50 MG/ML IJ SOLN
INTRAMUSCULAR | Status: AC
  Filled 2014-04-18: qty 1

## 2014-04-18 MED ORDER — FAMOTIDINE IN NACL 20-0.9 MG/50ML-% IV SOLN
20.0000 mg | Freq: Once | INTRAVENOUS | Status: AC
  Administered 2014-04-18: 20 mg via INTRAVENOUS

## 2014-04-18 MED ORDER — DEXTROSE 5 % IV SOLN
45.0000 mg/m2 | Freq: Once | INTRAVENOUS | Status: AC
  Administered 2014-04-18: 102 mg via INTRAVENOUS
  Filled 2014-04-18: qty 17

## 2014-04-18 MED ORDER — DIPHENHYDRAMINE HCL 50 MG/ML IJ SOLN
25.0000 mg | Freq: Once | INTRAMUSCULAR | Status: AC
  Administered 2014-04-18: 25 mg via INTRAVENOUS

## 2014-04-18 MED ORDER — DEXAMETHASONE SODIUM PHOSPHATE 10 MG/ML IJ SOLN
10.0000 mg | Freq: Once | INTRAMUSCULAR | Status: AC
  Administered 2014-04-18: 10 mg via INTRAVENOUS

## 2014-04-18 MED ORDER — DEXAMETHASONE SODIUM PHOSPHATE 10 MG/ML IJ SOLN
INTRAMUSCULAR | Status: AC
  Filled 2014-04-18: qty 1

## 2014-04-18 MED ORDER — SODIUM CHLORIDE 0.9 % IV SOLN
Freq: Once | INTRAVENOUS | Status: AC
  Administered 2014-04-18: 10:00:00 via INTRAVENOUS

## 2014-04-18 MED ORDER — SODIUM CHLORIDE 0.9 % IV SOLN
214.6000 mg | Freq: Once | INTRAVENOUS | Status: AC
  Administered 2014-04-18: 210 mg via INTRAVENOUS
  Filled 2014-04-18: qty 21

## 2014-04-18 MED ORDER — ONDANSETRON 16 MG/50ML IVPB (CHCC)
INTRAVENOUS | Status: AC
  Filled 2014-04-18: qty 16

## 2014-04-18 MED ORDER — FAMOTIDINE IN NACL 20-0.9 MG/50ML-% IV SOLN
INTRAVENOUS | Status: AC
  Filled 2014-04-18: qty 50

## 2014-04-18 MED ORDER — SODIUM CHLORIDE 0.9 % IJ SOLN
10.0000 mL | INTRAMUSCULAR | Status: DC | PRN
  Administered 2014-04-18: 10 mL
  Filled 2014-04-18: qty 10

## 2014-04-18 MED ORDER — HEPARIN SOD (PORK) LOCK FLUSH 100 UNIT/ML IV SOLN
500.0000 [IU] | Freq: Once | INTRAVENOUS | Status: AC | PRN
  Administered 2014-04-18: 500 [IU]
  Filled 2014-04-18: qty 5

## 2014-04-18 MED ORDER — ONDANSETRON 16 MG/50ML IVPB (CHCC)
16.0000 mg | Freq: Once | INTRAVENOUS | Status: AC
  Administered 2014-04-18: 16 mg via INTRAVENOUS

## 2014-04-18 NOTE — Progress Notes (Signed)
Patient completed first time taxol/carboplatin without any problems or complications. Pt given copy of AVS. Patient and son agreeable to return next Monday, 04/25/14 for second treatment. Cindi Carbon, RN

## 2014-04-18 NOTE — Patient Instructions (Signed)
[place] Discharge Instructions for Patients Receiving Chemotherapy  Today you received the following chemotherapy agents: taxol, carboplatin  To help prevent nausea and vomiting after your treatment, we encourage you to take your nausea medication as prescribed.    If you develop nausea and vomiting that is not controlled by your nausea medication, call the clinic.   BELOW ARE SYMPTOMS THAT SHOULD BE REPORTED IMMEDIATELY:  *FEVER GREATER THAN 100.5 F  *CHILLS WITH OR WITHOUT FEVER  NAUSEA AND VOMITING THAT IS NOT CONTROLLED WITH YOUR NAUSEA MEDICATION  *UNUSUAL SHORTNESS OF BREATH  *UNUSUAL BRUISING OR BLEEDING  TENDERNESS IN MOUTH AND THROAT WITH OR WITHOUT PRESENCE OF ULCERS  *URINARY PROBLEMS  *BOWEL PROBLEMS  UNUSUAL RASH Items with * indicate a potential emergency and should be followed up as soon as possible.  Feel free to call the clinic you have any questions or concerns. The clinic phone number is [phone].

## 2014-04-18 NOTE — Progress Notes (Signed)
Clinical Social Work Department CLINICAL SOCIAL WORK PLACEMENT NOTE 04/18/2014  Patient:  [name] [name], [name] [name]  Account Number:  [id number] Admit date:  04/10/2014  Clinical Social Worker:  Ulyess Blossom  Date/time:  04/14/2014 04:00 PM  Clinical Social Work is seeking post-discharge placement for this patient at the following level of care:   SKILLED NURSING   (*CSW will update this form in Epic as items are completed)   04/14/2014  Patient/family provided with [place] Department of Clinical Social Work's list of facilities offering this level of care within the geographic area requested by the patient (or if unable, by the patient's family).  04/14/2014  Patient/family informed of their freedom to choose among providers that offer the needed level of care, that participate in Medicare, Medicaid or managed care program needed by the patient, have an available bed and are willing to accept the patient.  04/14/2014  Patient/family informed of [facility]' ownership interest in [facility], as well as of the fact that they are under no obligation to receive care at this facility.  PASARR submitted to EDS on 04/14/2014 PASARR number received on 04/14/2014  FL2 transmitted to all facilities in geographic area requested by pt/family on  04/14/2014 FL2 transmitted to all facilities within larger geographic area on   Patient informed that his/her managed care company has contracts with or will negotiate with  certain facilities, including the following:     Patient/family informed of bed offers received:  04/15/2014 Patient chooses bed at [facility] [place] Physician recommends and patient chooses bed at    Patient to be transferred to [facility] on  04/16/2014 Patient to be transferred to facility by [facility] Patient and family notified of transfer on  Name of family member notified:    The following physician request were entered  in Epic:   Additional Comments:   Raynaldo Opitz, [place] Social Worker cell #: [phone]

## 2014-04-18 NOTE — Telephone Encounter (Signed)
gv and printed appt sched and avs for pt fro June and July....sed added tx.

## 2014-04-18 NOTE — Progress Notes (Signed)
  [facility] OFFICE PROGRESS NOTE   Diagnosis: Non-small cell lung cancer  INTERVAL HISTORY:   [name] was discharged on 04/16/2014 after an admission with chest pain and dyspnea. He is now living in a nursing facility. He resumed radiation 04/14/2014. He is maintained on oxygen. No new complaint.  Objective:  Vital signs in last 24 hours:  Blood pressure 115/58, pulse 64, temperature 97.7 F (36.5 C), temperature source Oral, resp. rate 20, height 5\' 11"  (1.803 m), weight 203 lb 4.8 oz (92.216 kg), SpO2 98.00%.   Resp: Bilateral expiratory wheeze, no respiratory distress Cardio: Regular rate and rhythm GI: No hepatosplenomegaly Vascular: No leg edema    Portacath/PICC-without erythema  Lab Results:  Lab Results  Component Value Date   WBC 4.7 04/18/2014   HGB 12.8* 04/18/2014   HCT 39.0 04/18/2014   MCV 86.5 04/18/2014   PLT 165 04/18/2014   NEUTROABS 2.8 04/18/2014    Medications: I have reviewed the patient's current medications.  Assessment/Plan: 1. Non-small cell lung cancer-left upper lobe squamous cell carcinoma  CT chest 03/14/2014 consistent with a left upper lobe mass, left hilar lymphadenopathy, and left upper lobe atelectasis, staging PET scan pending  Initiation of chest radiation 04/05/2014 2. admission 04/10/2014 with acute CHF and acute coronary syndrome, status post a cardiac catheterization 04/10/2014, clinical improvement with medical therapy  3. COPD  4. chronic renal failure  5. Diabetes  6. Neuropathy  7. history of non-Hodgkin's lymphoma    Disposition:  [name] appears stable. The plan is to begin weekly Taxol/carboplatin today. He will continue daily radiation. [name] will return for an office visit in 2 weeks.  Betsy Coder, MD  04/18/2014  9:30 AM

## 2014-04-19 ENCOUNTER — Telehealth: Payer: Self-pay | Admitting: Oncology

## 2014-04-19 ENCOUNTER — Encounter: Payer: Self-pay | Admitting: Internal Medicine

## 2014-04-19 ENCOUNTER — Non-Acute Institutional Stay (SKILLED_NURSING_FACILITY): Payer: Medicare Other | Admitting: Internal Medicine

## 2014-04-19 ENCOUNTER — Telehealth: Payer: Self-pay | Admitting: *Deleted

## 2014-04-19 ENCOUNTER — Encounter: Payer: Self-pay | Admitting: Radiation Oncology

## 2014-04-19 ENCOUNTER — Ambulatory Visit
Admission: RE | Admit: 2014-04-19 | Discharge: 2014-04-19 | Disposition: A | Discharge status: Home / Self Care | Payer: Medicare Other | Source: Ambulatory Visit | Attending: Radiation Oncology | Admitting: Radiation Oncology

## 2014-04-19 VITALS — BP 109/64 | HR 72 | Temp 97.5°F | Resp 20 | Wt 203.9 lb

## 2014-04-19 DIAGNOSIS — I350 Nonrheumatic aortic (valve) stenosis: Secondary | ICD-10-CM

## 2014-04-19 DIAGNOSIS — F3289 Other specified depressive episodes: Secondary | ICD-10-CM

## 2014-04-19 DIAGNOSIS — E119 Type 2 diabetes mellitus without complications: Secondary | ICD-10-CM

## 2014-04-19 DIAGNOSIS — I359 Nonrheumatic aortic valve disorder, unspecified: Secondary | ICD-10-CM

## 2014-04-19 DIAGNOSIS — G589 Mononeuropathy, unspecified: Secondary | ICD-10-CM

## 2014-04-19 DIAGNOSIS — Z87898 Personal history of other specified conditions: Secondary | ICD-10-CM

## 2014-04-19 DIAGNOSIS — C3412 Malignant neoplasm of upper lobe, left bronchus or lung: Secondary | ICD-10-CM

## 2014-04-19 DIAGNOSIS — C3410 Malignant neoplasm of upper lobe, unspecified bronchus or lung: Secondary | ICD-10-CM

## 2014-04-19 DIAGNOSIS — F329 Major depressive disorder, single episode, unspecified: Secondary | ICD-10-CM

## 2014-04-19 DIAGNOSIS — I2510 Atherosclerotic heart disease of native coronary artery without angina pectoris: Secondary | ICD-10-CM

## 2014-04-19 DIAGNOSIS — Z510 Encounter for antineoplastic radiation therapy: Secondary | ICD-10-CM | POA: Diagnosis not present

## 2014-04-19 DIAGNOSIS — F32A Depression, unspecified: Secondary | ICD-10-CM

## 2014-04-19 DIAGNOSIS — E785 Hyperlipidemia, unspecified: Secondary | ICD-10-CM

## 2014-04-19 NOTE — Progress Notes (Addendum)
Weekly rad txs left chest, 8/35 completed, slight erythema  Skin intact,  using radiaplex , no c/o pain except mild shoulders arthriti 4/10 scale at present, s pain stated,patient wearing 2 liters oxygen n/c 100%, coughs up clear sputum at times, sob with exertion, appetite good, no c/o difficulty swallowing, asked to have MD to write to [facility] facilty to give patient his pain medication prior to leaving  That facility to coming for his daily radiation treatments, hard to ly on flat table 2:00 PM'

## 2014-04-19 NOTE — Progress Notes (Signed)
Patient ID: [name] [name], male   DOB: [birth year], 80 y.o.   MRN: [id number]  Provider:  Rexene Edison. Mariea Clonts, D.O., C.M.D. Location:  [facility]  PCP: [name], MD  Code Status: DNR  No Known Allergies  Chief Complaint  Patient presents with  . New Evaluation    new admission    HPI: 80 y.o. male with h/o Non Hodgkin's lymphoma x 2, COPD, CAD, PAD s/p aortobifemoral bypass, DMII, GI bleeding, and, most recently, LUL carcinoma of the lung for which he is now receiving weekly chemotherapy with carboplatin/paclitaxel and radiation treatments (had one today).  His next chemo is 6/29.  He is here for therapy while getting his lung cancer treatments.  He is doing well so far w/o any complaints.    ROS: Review of Systems  Constitutional: Negative for fever.  Eyes: Negative for blurred vision.  Respiratory: Positive for shortness of breath. Negative for cough.        Improving  Cardiovascular: Negative for chest pain and leg swelling.  Gastrointestinal: Negative for abdominal pain, constipation, blood in stool and melena.       Had GI bleed in past  Genitourinary: Positive for frequency. Negative for dysuria.       Notes frequency since foley removed after he was hospitalized  Musculoskeletal: Negative for falls and myalgias.  Skin: Negative for rash.  Neurological: Positive for weakness. Negative for dizziness, loss of consciousness and headaches.  Endo/Heme/Allergies: Bruises/bleeds easily.  Psychiatric/Behavioral: Positive for depression.       Denies at present, but in his history    Past Medical History  Diagnosis Date  . Peripheral arterial disease     s/p Ao Bifem Bypass  . Diabetes mellitus without complication   . CAD (coronary artery disease)     By  CT Scan  . Hyperlipidemia   . Hypertension   . Non Hodgkin's lymphoma 2000, 2010  . Peripheral neuropathy   . COPD (chronic obstructive pulmonary disease)   . History of lower GI bleeding   .  Substance abuse   . Lung cancer, upper lobe     left lung  . AAA (abdominal aortic aneurysm)    Past Surgical History  Procedure Laterality Date  . Fracture surgery  2009    Left ankle  . Pr vein bypass graft,aorto-fem-pop  2005    Right common femoral to BK popliteal BPG  . Pr vein bypass graft,aorto-fem-pop  01-27-05    Revision of Right fem-pop BPG  . Cardiac catheterization  1990's  . Video bronchoscopy Bilateral 03/14/2014    Procedure: VIDEO BRONCHOSCOPY WITH FLUORO;  Surgeon: Rigoberto Noel, MD;  Location: [place];  Service: Cardiopulmonary;  Laterality: Bilateral;  . Nm myoview ltd  11/2013    Lexiscan.  EF 60%, small fixed inferoapical defect - ? artifact  . Transthoracic echocardiogram  11/2013    EF 60-65%, Gr 1 DD, Mod AS, Sever MAC - no MS.      Social History:   reports that he quit smoking about 10 years ago. His smoking use included Cigarettes. He has a 50 pack-year smoking history. He has never used smokeless tobacco. He reports that he does not drink alcohol or use illicit drugs.  Family History  Problem Relation Age of Onset  . Cancer Mother     Colon  . Heart attack Mother   . Stroke Father   . Hyperlipidemia Father   . Hyperlipidemia Sister   . Hyperlipidemia Brother   .  Hyperlipidemia Daughter   . Hypertension Son     Medications: Patient's Medications  New Prescriptions   No medications on file  Previous Medications   ACETAMINOPHEN (TYLENOL ARTHRITIS PAIN PO)    Take 650 mg by mouth 2 (two) times daily.    ASPIRIN 81 MG TABLET    Take 160 mg by mouth 2 (two) times daily.   CINNAMON PO    Take 1,000 mg by mouth 2 (two) times daily.   DULOXETINE (CYMBALTA) 30 MG CAPSULE    Take 30 mg by mouth daily.   FENOFIBRATE MICRONIZED (LOFIBRA) 200 MG CAPSULE    Take 200 mg by mouth daily before breakfast.   FINASTERIDE (PROSCAR) 5 MG TABLET    Take 5 mg by mouth daily.   FUROSEMIDE (LASIX) 20 MG TABLET    Take 1 tablet (20 mg total) by mouth daily as needed  for fluid or edema.   GABAPENTIN (NEURONTIN) 600 MG TABLET    Take 600 mg by mouth 3 (three) times daily.    GLUCOSAMINE-CHONDROITIN 500-400 MG TABLET    Take 1 tablet by mouth 2 (two) times daily.   HYALURONATE SODIUM (RADIAPLEXRX) GEL    Apply 1 application topically 2 (two) times daily.   INSULIN ASPART (NOVOLOG) 100 UNIT/ML INJECTION    Inject 3 Units into the skin 3 (three) times daily with meals.   INSULIN GLARGINE (LANTUS) 100 UNIT/ML INJECTION    Inject 0.22 mLs (22 Units total) into the skin at bedtime.   IPRATROPIUM-ALBUTEROL (COMBIVENT) 20-100 MCG/ACT AERS RESPIMAT    Inhale 1 puff into the lungs every 6 (six) hours. As needed for wheezing-   IPRATROPIUM-ALBUTEROL (DUONEB) 0.5-2.5 (3) MG/3ML SOLN    Take 3 mLs by nebulization 3 (three) times daily.   LIDOCAINE-PRILOCAINE (EMLA) CREAM    Apply 1 application topically as needed. Apply 1-2 hours prior to stick and cover with plastic wrap   METOPROLOL TARTRATE (LOPRESSOR) 25 MG TABLET    Take 1 tablet (25 mg total) by mouth 2 (two) times daily.   NITROGLYCERIN (NITROLINGUAL) 0.4 MG/SPRAY SPRAY    Place 1 spray under the tongue every 5 (five) minutes x 3 doses as needed for chest pain.   OXYCODONE-ACETAMINOPHEN (PERCOCET/ROXICET) 5-325 MG PER TABLET    Take 1 tablet by mouth every 4 (four) hours as needed for severe pain.   PROCHLORPERAZINE (COMPAZINE) 5 MG TABLET    Take 1 tablet (5 mg total) by mouth every 6 (six) hours as needed for nausea or vomiting.   ROSUVASTATIN (CRESTOR) 10 MG TABLET    Take 10 mg by mouth at bedtime.    TAMSULOSIN HCL (FLOMAX) 0.4 MG CAPS    Take 0.4 mg by mouth daily.   ULTRA-THIN II SHORT PEN NEEDLE 31G X 8 MM MISC      Modified Medications   No medications on file  Discontinued Medications   DOXYCYCLINE (VIBRA-TABS) 100 MG TABLET       HUMALOG MIX 75/25 KWIKPEN (75-25) 100 UNIT/ML KWIKPEN       INSULIN ASPART (NOVOLOG) 100 UNIT/ML INJECTION    Inject 0-20 Units into the skin 3 (three) times daily with meals.     METFORMIN (GLUCOPHAGE) 1000 MG TABLET       PREDNISONE (DELTASONE) 10 MG TABLET         Physical Exam: Filed Vitals:   04/19/14 1041  BP: 105/68  Pulse: 88  Temp: 97.9 F (36.6 C)  Resp: 22  Height: 5\' 11"  (1.803 m)  Weight: 202 lb (91.627 kg)  SpO2: 86%  Physical Exam  Constitutional: He is oriented to person, place, and time. He appears well-developed and well-nourished. No distress.  HENT:  Head: Normocephalic and atraumatic.  Right Ear: External ear normal.  Left Ear: External ear normal.  Nose: Nose normal.  Mouth/Throat: Oropharynx is clear and moist. No oropharyngeal exudate.  Eyes: Conjunctivae and EOM are normal. Pupils are equal, round, and reactive to light.  Cardiovascular: Normal rate and regular rhythm.   Pulmonary/Chest: Effort normal. No respiratory distress. He has wheezes.  Left upper lung field  Abdominal: Soft. Bowel sounds are normal. He exhibits no distension and no mass. There is no tenderness.  Musculoskeletal: Normal range of motion. He exhibits no edema and no tenderness.  Neurological: He is alert and oriented to person, place, and time. No cranial nerve deficit.  Some difficulty finding words and remembering dates, slow speech, but appropriate  Skin:  Red cheeks;  Has blue marks on his chest for XRT; large ecchymoses of right antecubital fossa;  Has portacath right upper chest  Psychiatric: He has a normal mood and affect. His behavior is normal. Judgment and thought content normal.    Labs reviewed: Basic Metabolic Panel:  Recent Labs  04/14/14 0525 04/15/14 0525 04/16/14 0508  NA 132* 128* 133*  K 4.3 4.2 4.6  CL 92* 89* 94*  CO2 30 28 28   GLUCOSE 252* 249* 257*  BUN 19 19 19   CREATININE 0.75 0.69 0.76  CALCIUM 9.1 9.0 9.8  MG 1.5 1.3* 1.4*   Liver Function Tests:  Recent Labs  04/04/14 1158 04/13/14 0315 04/14/14 0525  AST 13 15 13   ALT 17 16 11   ALKPHOS 39* 52 53  BILITOT 0.80 1.2 1.3*  PROT 6.1* 6.2 5.9*  ALBUMIN  2.9* 2.7* 2.6*  CBC:  Recent Labs  04/10/14 1145  04/13/14 0315  04/15/14 0525 04/16/14 0508 04/18/14 0854  WBC 7.3  < > 6.3  < > 5.4 5.3 4.7  NEUTROABS 5.3  --  4.3  --   --   --  2.8  HGB 11.0*  < > 12.2*  < > 11.6* 11.8* 12.8*  HCT 34.3*  < > 37.9*  < > 36.3* 36.9* 39.0  MCV 87.5  < > 87.3  < > 87.3 87.4 86.5  PLT 166  < > 156  < > 156 157 165  < > = values in this interval not displayed. Cardiac Enzymes:  Recent Labs  03/21/14 2352 03/22/14 0447 04/10/14 1145  TROPONINI <0.30 <0.30 0.55*  CBG:  Recent Labs  04/15/14 1649 04/16/14 0755 04/16/14 1215  GLUCAP 190* 232* 343*   Imaging and Procedures: CXR 03/14/14:  Consolidative masslike density in left perihilar region concerning for soft tissue mass until proven otherwise.  Contrast chest CT recommended.  CT chest 03/14/14:   1. No evidence of pulmonary embolism.  2. However, there is an apparent left upper lobe perihilar mass with complete occlusion of the left upper lobe bronchus, complete atelectasis of the left upper lobe, and marked narrowing of several left-sided pulmonary artery branches. Findings are highly suspicious for primary bronchogenic malignancy, and correlation with PET-CT and/or bronchoscopy for diagnostic and staging purposes is strongly recommended in the near future.  3. Atherosclerosis, including left main and 3 vessel coronary artery disease. Assessment for potential risk factor modification, dietary therapy or pharmacologic therapy may be warranted, if clinically indicated.  4. There are calcifications of the aortic valve and mitral annulus. Echocardiographic  correlation for evaluation of potential valvular dysfunction may be warranted if clinically indicated.  5. Additional incidental findings, as above.  These results were called by telephone at the time of interpretation on 03/14/2014 at 3:13 AM to Dr. Elyn Peers, who verbally acknowledged these results.  CT head 03/21/14:  No acute intracranial  abnormality, mild cerebral atrophy.  Assessment/Plan 1. Malignant neoplasm of upper lobe of left lung -diagnosed about one month ago -is now on XRT and chemotherapy -here for therapy for strengthening while on chemo  2. DIABETES MELLITUS, TYPE II, ON INSULIN -cont lantus 22 units and novolog 3 units ac meals, monitor cbgs ac + hs -may need additional novolog due to steroids pre-chemo and had previously had a sliding scale in addition to his 3 units--will need to review at routine visit or if noted to have highs  3. Depression -his pastor was visiting him when I arrived today -cont cymbalta 30mg --consider increasing dose if his mood goes downhill, but he was in good spirits today, joking some with me  4. LYMPHOMA, HX OF -NHL s/p two previous rounds of chemo   5. HYPERLIPIDEMIA -cont crestor and fenofibrate for lipid control  6. NEUROPATHY -cont gabapentin--likely from his chemo, but could also have some component of diabetic neuropathy, as well  7. CAD -s/p cath in the past, cont secondary prevention of MI with baby asa, statin, lopressor  8. Aortic stenosis -had recent syncopal episode, echo showed normal EF, grade 1 diastolic dysfunction, valve area 1.84 cm^2, vmax 1.61cm^2  Functional status:  Independent in ADLs, ambulatory  Family/ staff Communication: seen with unit supervisor  Labs/tests ordered:  Cbc, cmp in 1 week

## 2014-04-19 NOTE — Telephone Encounter (Signed)
[place] and spoke to Candis Shine' nurse.  Advised her that Onofre needs to take 1 tablet of percocet an hour before his radiation treatments to help with his shoulder pain.  Olivia Mackie verbalized agreement and said she will make sure that he takes a percocet 1 hour before radiation.

## 2014-04-19 NOTE — Telephone Encounter (Signed)
Reports he is doing well with no adverse effect from chemotherapy. Says PAC was "great". Reminded him to ask nurse if he needs nausea med and to put his EMLA cream on for next chemo before he leaves. He understands and agrees.

## 2014-04-19 NOTE — Progress Notes (Signed)
Weekly Management Note Current Dose:14.4 Gy  Projected Dose:63 Gy   Narrative:  The patient presents for routine under treatment assessment.  CBCT/MVCT images/Port film x-rays were reviewed.  The chart was checked. Would like pain med before tx to prevent back pain. Needs order. No swallowing issues. Accompanied by son.   Physical Findings:  Unchanged  Vitals:  Filed Vitals:   04/19/14 1354  BP: 109/64  Pulse: 72  Temp: 97.5 F (36.4 C)  Resp: 20   Weight:  Wt Readings from Last 3 Encounters:  04/19/14 203 lb 14.4 oz (92.488 kg)  04/19/14 202 lb (91.627 kg)  04/18/14 203 lb 4.8 oz (92.216 kg)   Lab Results  Component Value Date   WBC 4.7 04/18/2014   HGB 12.8* 04/18/2014   HCT 39.0 04/18/2014   MCV 86.5 04/18/2014   PLT 165 04/18/2014   Lab Results  Component Value Date   CREATININE 0.76 04/16/2014   BUN 19 04/16/2014   NA 133* 04/16/2014   K 4.6 04/16/2014   CL 94* 04/16/2014   CO2 28 04/16/2014     Impression:  The patient is tolerating radiation  Plan:  Continue treatment as planned. Wrote order for oxycodone 1 hour prior to RT.

## 2014-04-20 ENCOUNTER — Ambulatory Visit: Admission: RE | Admit: 2014-04-20 | Payer: Medicare Other | Source: Ambulatory Visit

## 2014-04-21 ENCOUNTER — Ambulatory Visit
Admission: RE | Admit: 2014-04-21 | Discharge: 2014-04-21 | Disposition: A | Discharge status: Home / Self Care | Payer: Medicare Other | Source: Ambulatory Visit | Attending: Radiation Oncology | Admitting: Radiation Oncology

## 2014-04-21 ENCOUNTER — Encounter: Payer: Medicare Other | Admitting: Radiation Oncology

## 2014-04-21 DIAGNOSIS — Z510 Encounter for antineoplastic radiation therapy: Secondary | ICD-10-CM | POA: Diagnosis not present

## 2014-04-22 ENCOUNTER — Telehealth: Payer: Self-pay | Admitting: Oncology

## 2014-04-22 ENCOUNTER — Other Ambulatory Visit: Payer: Self-pay | Admitting: *Deleted

## 2014-04-22 ENCOUNTER — Ambulatory Visit
Admission: RE | Admit: 2014-04-22 | Discharge: 2014-04-22 | Disposition: A | Discharge status: Home / Self Care | Payer: Medicare Other | Source: Ambulatory Visit | Attending: Radiation Oncology | Admitting: Radiation Oncology

## 2014-04-22 DIAGNOSIS — C3412 Malignant neoplasm of upper lobe, left bronchus or lung: Secondary | ICD-10-CM | POA: Insufficient documentation

## 2014-04-22 DIAGNOSIS — C8590 Non-Hodgkin lymphoma, unspecified, unspecified site: Secondary | ICD-10-CM

## 2014-04-22 DIAGNOSIS — Z510 Encounter for antineoplastic radiation therapy: Secondary | ICD-10-CM | POA: Diagnosis not present

## 2014-04-22 NOTE — Telephone Encounter (Signed)
Gave pt appt to [facility] for 6/29 lab,and chemo

## 2014-04-24 ENCOUNTER — Other Ambulatory Visit: Payer: Self-pay | Admitting: Oncology

## 2014-04-25 ENCOUNTER — Ambulatory Visit
Admission: RE | Admit: 2014-04-25 | Discharge: 2014-04-25 | Disposition: A | Discharge status: Home / Self Care | Payer: Medicare Other | Source: Ambulatory Visit | Attending: Radiation Oncology | Admitting: Radiation Oncology

## 2014-04-25 ENCOUNTER — Ambulatory Visit (HOSPITAL_BASED_OUTPATIENT_CLINIC_OR_DEPARTMENT_OTHER): Payer: Medicare Other

## 2014-04-25 ENCOUNTER — Other Ambulatory Visit (HOSPITAL_BASED_OUTPATIENT_CLINIC_OR_DEPARTMENT_OTHER): Payer: Medicare Other

## 2014-04-25 VITALS — BP 96/63 | HR 75 | Temp 96.4°F | Resp 20

## 2014-04-25 DIAGNOSIS — C3412 Malignant neoplasm of upper lobe, left bronchus or lung: Secondary | ICD-10-CM

## 2014-04-25 DIAGNOSIS — C8590 Non-Hodgkin lymphoma, unspecified, unspecified site: Secondary | ICD-10-CM

## 2014-04-25 DIAGNOSIS — Z510 Encounter for antineoplastic radiation therapy: Secondary | ICD-10-CM | POA: Diagnosis not present

## 2014-04-25 DIAGNOSIS — C3410 Malignant neoplasm of upper lobe, unspecified bronchus or lung: Secondary | ICD-10-CM

## 2014-04-25 DIAGNOSIS — Z5111 Encounter for antineoplastic chemotherapy: Secondary | ICD-10-CM

## 2014-04-25 LAB — CBC WITH DIFFERENTIAL/PLATELET
BASO%: 0.9 % (ref 0.0–2.0)
BASOS ABS: 0 10*3/uL (ref 0.0–0.1)
EOS ABS: 0.1 10*3/uL (ref 0.0–0.5)
EOS%: 2.7 % (ref 0.0–7.0)
HEMATOCRIT: 35.6 % — AB (ref 38.4–49.9)
HGB: 11.6 g/dL — ABNORMAL LOW (ref 13.0–17.1)
LYMPH%: 17.9 % (ref 14.0–49.0)
MCH: 28.1 pg (ref 27.2–33.4)
MCHC: 32.5 g/dL (ref 32.0–36.0)
MCV: 86.5 fL (ref 79.3–98.0)
MONO#: 0.5 10*3/uL (ref 0.1–0.9)
MONO%: 13.4 % (ref 0.0–14.0)
NEUT%: 65.1 % (ref 39.0–75.0)
NEUTROS ABS: 2.3 10*3/uL (ref 1.5–6.5)
PLATELETS: 221 10*3/uL (ref 140–400)
RBC: 4.12 10*6/uL — ABNORMAL LOW (ref 4.20–5.82)
RDW: 15.5 % — ABNORMAL HIGH (ref 11.0–14.6)
WBC: 3.6 10*3/uL — ABNORMAL LOW (ref 4.0–10.3)
lymph#: 0.6 10*3/uL — ABNORMAL LOW (ref 0.9–3.3)

## 2014-04-25 MED ORDER — DEXAMETHASONE SODIUM PHOSPHATE 10 MG/ML IJ SOLN
INTRAMUSCULAR | Status: AC
  Filled 2014-04-25: qty 1

## 2014-04-25 MED ORDER — SODIUM CHLORIDE 0.9 % IV SOLN
214.6000 mg | Freq: Once | INTRAVENOUS | Status: AC
  Administered 2014-04-25: 210 mg via INTRAVENOUS
  Filled 2014-04-25: qty 21

## 2014-04-25 MED ORDER — SODIUM CHLORIDE 0.9 % IJ SOLN
10.0000 mL | INTRAMUSCULAR | Status: DC | PRN
  Administered 2014-04-25: 10 mL
  Filled 2014-04-25: qty 10

## 2014-04-25 MED ORDER — DIPHENHYDRAMINE HCL 50 MG/ML IJ SOLN
INTRAMUSCULAR | Status: AC
  Filled 2014-04-25: qty 1

## 2014-04-25 MED ORDER — DIPHENHYDRAMINE HCL 50 MG/ML IJ SOLN
25.0000 mg | Freq: Once | INTRAMUSCULAR | Status: AC
  Administered 2014-04-25: 25 mg via INTRAVENOUS

## 2014-04-25 MED ORDER — FAMOTIDINE IN NACL 20-0.9 MG/50ML-% IV SOLN
INTRAVENOUS | Status: AC
  Filled 2014-04-25: qty 50

## 2014-04-25 MED ORDER — HEPARIN SOD (PORK) LOCK FLUSH 100 UNIT/ML IV SOLN
500.0000 [IU] | Freq: Once | INTRAVENOUS | Status: AC | PRN
  Administered 2014-04-25: 500 [IU]
  Filled 2014-04-25: qty 5

## 2014-04-25 MED ORDER — DEXAMETHASONE SODIUM PHOSPHATE 10 MG/ML IJ SOLN
10.0000 mg | Freq: Once | INTRAMUSCULAR | Status: AC
  Administered 2014-04-25: 10 mg via INTRAVENOUS

## 2014-04-25 MED ORDER — SODIUM CHLORIDE 0.9 % IV SOLN
Freq: Once | INTRAVENOUS | Status: AC
  Administered 2014-04-25: 10:00:00 via INTRAVENOUS

## 2014-04-25 MED ORDER — FAMOTIDINE IN NACL 20-0.9 MG/50ML-% IV SOLN
20.0000 mg | Freq: Once | INTRAVENOUS | Status: AC
  Administered 2014-04-25: 20 mg via INTRAVENOUS

## 2014-04-25 MED ORDER — ONDANSETRON 16 MG/50ML IVPB (CHCC)
INTRAVENOUS | Status: AC
  Filled 2014-04-25: qty 16

## 2014-04-25 MED ORDER — DEXTROSE 5 % IV SOLN
45.0000 mg/m2 | Freq: Once | INTRAVENOUS | Status: AC
  Administered 2014-04-25: 102 mg via INTRAVENOUS
  Filled 2014-04-25: qty 17

## 2014-04-25 MED ORDER — ONDANSETRON 16 MG/50ML IVPB (CHCC)
16.0000 mg | Freq: Once | INTRAVENOUS | Status: AC
  Administered 2014-04-25: 16 mg via INTRAVENOUS

## 2014-04-25 NOTE — Progress Notes (Signed)
Per Dr. Benay Spice, okay to treat today with bmet results from 04/16/14.

## 2014-04-25 NOTE — Patient Instructions (Signed)
[facility] Discharge Instructions for Patients Receiving Chemotherapy  Today you received the following chemotherapy agents Taxol, Carboplatin.  To help prevent nausea and vomiting after your treatment, we encourage you to take your nausea medication as prescribed.   If you develop nausea and vomiting that is not controlled by your nausea medication, call the clinic.   BELOW ARE SYMPTOMS THAT SHOULD BE REPORTED IMMEDIATELY:  *FEVER GREATER THAN 100.5 F  *CHILLS WITH OR WITHOUT FEVER  NAUSEA AND VOMITING THAT IS NOT CONTROLLED WITH YOUR NAUSEA MEDICATION  *UNUSUAL SHORTNESS OF BREATH  *UNUSUAL BRUISING OR BLEEDING  TENDERNESS IN MOUTH AND THROAT WITH OR WITHOUT PRESENCE OF ULCERS  *URINARY PROBLEMS  *BOWEL PROBLEMS  UNUSUAL RASH Items with * indicate a potential emergency and should be followed up as soon as possible.  Feel free to call the clinic you have any questions or concerns. The clinic phone number is [phone].

## 2014-04-26 ENCOUNTER — Ambulatory Visit
Admission: RE | Admit: 2014-04-26 | Discharge: 2014-04-26 | Disposition: A | Discharge status: Home / Self Care | Payer: Medicare Other | Source: Ambulatory Visit | Attending: Radiation Oncology | Admitting: Radiation Oncology

## 2014-04-26 ENCOUNTER — Encounter: Payer: Self-pay | Admitting: Internal Medicine

## 2014-04-26 ENCOUNTER — Non-Acute Institutional Stay (SKILLED_NURSING_FACILITY): Payer: Medicare Other | Admitting: Internal Medicine

## 2014-04-26 ENCOUNTER — Encounter: Payer: Self-pay | Admitting: Radiation Oncology

## 2014-04-26 VITALS — BP 105/56 | HR 72 | Temp 97.5°F | Resp 20 | Ht 71.0 in | Wt 207.6 lb

## 2014-04-26 DIAGNOSIS — C3410 Malignant neoplasm of upper lobe, unspecified bronchus or lung: Secondary | ICD-10-CM

## 2014-04-26 DIAGNOSIS — E1165 Type 2 diabetes mellitus with hyperglycemia: Principal | ICD-10-CM

## 2014-04-26 DIAGNOSIS — Z510 Encounter for antineoplastic radiation therapy: Secondary | ICD-10-CM | POA: Diagnosis not present

## 2014-04-26 DIAGNOSIS — IMO0001 Reserved for inherently not codable concepts without codable children: Secondary | ICD-10-CM | POA: Insufficient documentation

## 2014-04-26 DIAGNOSIS — C3412 Malignant neoplasm of upper lobe, left bronchus or lung: Secondary | ICD-10-CM

## 2014-04-26 LAB — AFB CULTURE WITH SMEAR (NOT AT ARMC): Acid Fast Smear: NONE SEEN

## 2014-04-26 MED ORDER — INSULIN ASPART 100 UNIT/ML ~~LOC~~ SOLN: 5.0000 [IU] | Freq: Three times a day (TID) | SUBCUTANEOUS | Status: DC

## 2014-04-26 NOTE — Progress Notes (Signed)
[name] [name] has completed 12 fractions to his left chest.  He denies pain.  He has been taking 1 percocet before treatments for his right shoulder pain.  He denies any trouble swallowing or sore throat. He reports an occasional cough and sometimes brings up "a spot of blood."  He also sees blood sometimes when he blows his nose.  He reports that his shortness of breath with activity is getting better.  His oxygen saturation on 2l of oxygen is 100%.  He reports fatigue after physical therapy sessions.  He is currently living at [facility].  He had chemotherapy yesterday.

## 2014-04-26 NOTE — Progress Notes (Signed)
  Radiation Oncology         [phone] ________________________________  Name: [name] [name] MRN: [id number]  Date: 04/26/2014  DOB: 1933/07/13  Weekly Radiation Therapy Management  DIAGNOSIS: Squamous cell carcinoma of the left lung presenting with left upper lobe bronchial obstruction and collapse   Current Dose: 21.6 Gy     Planned Dose:  63 Gy  Narrative . . . . . . . . The patient presents for routine under treatment assessment.                                   The patient is without complaint. He says his breathing is better with exertion. Patient is on supplemental oxygen at this time.  He denies any swallowing problems.                                 Set-up films were reviewed.                                 The chart was checked. Physical Findings. . .  height is 5\' 11"  (1.803 m) and weight is 207 lb 9.6 oz (94.167 kg). His oral temperature is 97.5 F (36.4 C). His blood pressure is 105/56 and his pulse is 72. His respiration is 20 and oxygen saturation is 100%. . Weight essentially stable.  The lungs are clear. The heart has a regular rhythm and rate. Impression . . . . . . . The patient is tolerating radiation. Plan . . . . . . . . . . . . Continue treatment as planned.  ________________________________   Blair Promise, PhD, MD

## 2014-04-26 NOTE — Progress Notes (Signed)
Patient ID: [name] [name], male   DOB: 1933/03/17, 80 y.o.   MRN: [id number]  Location:  [facility] Provider:  Rexene Edison. Mariea Clonts, D.O., C.M.D.  Code Status:  DNR   Chief Complaint  Patient presents with  . Acute Visit    to review CBGs    HPI:  80 yo male with h/o NHL, DMII, depression here for short term rehab while receiving his chemotherapy and XRT for lung cancer.  He gets steroids with his treatments and his CBGs have consistently been running in the 200s-400s, especially high in the evenings with lantus 22 units qhs and novolog 3 units with meals.    Review of Systems:  Review of Systems  Constitutional: Positive for weight loss and malaise/fatigue.  Eyes: Negative for blurred vision.  Respiratory: Positive for cough and shortness of breath.   Cardiovascular: Positive for leg swelling. Negative for chest pain.  Gastrointestinal: Negative for abdominal pain.  Genitourinary: Negative for dysuria.  Musculoskeletal: Positive for falls.  Neurological: Positive for weakness. Negative for dizziness and headaches.  Psychiatric/Behavioral: Positive for depression and memory loss.    Medications: Patient's Medications  New Prescriptions   No medications on file  Previous Medications   ACETAMINOPHEN (TYLENOL ARTHRITIS PAIN PO)    Take 650 mg by mouth 2 (two) times daily.    ASPIRIN 81 MG TABLET    Take 160 mg by mouth 2 (two) times daily.   CINNAMON PO    Take 1,000 mg by mouth 2 (two) times daily.   DULOXETINE (CYMBALTA) 30 MG CAPSULE    Take 30 mg by mouth daily.   FENOFIBRATE MICRONIZED (LOFIBRA) 200 MG CAPSULE    Take 200 mg by mouth daily before breakfast.   FINASTERIDE (PROSCAR) 5 MG TABLET    Take 5 mg by mouth daily.   FUROSEMIDE (LASIX) 20 MG TABLET    Take 1 tablet (20 mg total) by mouth daily as needed for fluid or edema.   GABAPENTIN (NEURONTIN) 600 MG TABLET    Take 600 mg by mouth 3 (three) times daily.    GLUCOSAMINE-CHONDROITIN 500-400 MG TABLET     Take 1 tablet by mouth 2 (two) times daily.   HYALURONATE SODIUM (RADIAPLEXRX) GEL    Apply 1 application topically 2 (two) times daily.   INSULIN GLARGINE (LANTUS) 100 UNIT/ML INJECTION    Inject 0.22 mLs (22 Units total) into the skin at bedtime.   IPRATROPIUM-ALBUTEROL (COMBIVENT) 20-100 MCG/ACT AERS RESPIMAT    Inhale 1 puff into the lungs every 6 (six) hours. As needed for wheezing-   IPRATROPIUM-ALBUTEROL (DUONEB) 0.5-2.5 (3) MG/3ML SOLN    Take 3 mLs by nebulization 3 (three) times daily.   LIDOCAINE-PRILOCAINE (EMLA) CREAM    Apply 1 application topically as needed. Apply 1-2 hours prior to stick and cover with plastic wrap   METOPROLOL TARTRATE (LOPRESSOR) 25 MG TABLET    Take 1 tablet (25 mg total) by mouth 2 (two) times daily.   NITROGLYCERIN (NITROLINGUAL) 0.4 MG/SPRAY SPRAY    Place 1 spray under the tongue every 5 (five) minutes x 3 doses as needed for chest pain.   OXYCODONE-ACETAMINOPHEN (PERCOCET/ROXICET) 5-325 MG PER TABLET    Take 1 tablet by mouth every 4 (four) hours as needed for severe pain.   PROCHLORPERAZINE (COMPAZINE) 5 MG TABLET    Take 1 tablet (5 mg total) by mouth every 6 (six) hours as needed for nausea or vomiting.   ROSUVASTATIN (CRESTOR) 10 MG TABLET  Take 10 mg by mouth at bedtime.    TAMSULOSIN HCL (FLOMAX) 0.4 MG CAPS    Take 0.4 mg by mouth daily.   ULTRA-THIN II SHORT PEN NEEDLE 31G X 8 MM MISC      Modified Medications   Modified Medication Previous Medication   INSULIN ASPART (NOVOLOG) 100 UNIT/ML INJECTION insulin aspart (NOVOLOG) 100 UNIT/ML injection      Inject 5 Units into the skin 3 (three) times daily with meals.    Inject 3 Units into the skin 3 (three) times daily with meals.  Discontinued Medications   No medications on file    Physical Exam: Filed Vitals:   04/26/14 1045  BP: 88/48  Pulse: 84  Temp: 97.5 F (36.4 C)  Resp: 18  Height: 5\' 11"  (1.803 m)  Weight: 200 lb (90.719 kg)  SpO2: 92%   Physical Exam  Constitutional: No  distress.  Cardiovascular: Normal rate, regular rhythm, normal heart sounds and intact distal pulses.   Pulmonary/Chest: He has wheezes.  rhonchi  Abdominal: Soft. Bowel sounds are normal. He exhibits no distension and no mass. There is no tenderness.  Neurological: He is alert.     Labs reviewed: Basic Metabolic Panel:  Recent Labs  04/14/14 0525 04/15/14 0525 04/16/14 0508  NA 132* 128* 133*  K 4.3 4.2 4.6  CL 92* 89* 94*  CO2 30 28 28   GLUCOSE 252* 249* 257*  BUN 19 19 19   CREATININE 0.75 0.69 0.76  CALCIUM 9.1 9.0 9.8  MG 1.5 1.3* 1.4*    Liver Function Tests:  Recent Labs  04/04/14 1158 04/13/14 0315 04/14/14 0525  AST 13 15 13   ALT 17 16 11   ALKPHOS 39* 52 53  BILITOT 0.80 1.2 1.3*  PROT 6.1* 6.2 5.9*  ALBUMIN 2.9* 2.7* 2.6*    CBC:  Recent Labs  04/13/14 0315  04/16/14 0508 04/18/14 0854 04/25/14 0952  WBC 6.3  < > 5.3 4.7 3.6*  NEUTROABS 4.3  --   --  2.8 2.3  HGB 12.2*  < > 11.8* 12.8* 11.6*  HCT 37.9*  < > 36.9* 39.0 35.6*  MCV 87.3  < > 87.4 86.5 86.5  PLT 156  < > 157 165 221  < > = values in this interval not displayed. hba1c was 9.3 on 6/17  Assessment/Plan 1. Diabetes mellitus type 2, uncontrolled, without complications -increase novolog to 5 units with meals, cont lantus 22 units  2. Malignant neoplasm of upper lobe of left lung -continues his treatments and therapy  Family/ staff Communication: seen with unit supervisor  Goals of care: here for rehab with goal to return home, getting chemo and XRT, code status DNR  Labs/tests ordered:  Has upcoming cbc, cmp 04/27/14

## 2014-04-27 ENCOUNTER — Telehealth: Payer: Self-pay | Admitting: *Deleted

## 2014-04-27 ENCOUNTER — Ambulatory Visit
Admission: RE | Admit: 2014-04-27 | Discharge: 2014-04-27 | Disposition: A | Discharge status: Home / Self Care | Payer: Medicare Other | Source: Ambulatory Visit | Attending: Radiation Oncology | Admitting: Radiation Oncology

## 2014-04-27 ENCOUNTER — Encounter: Payer: Self-pay | Admitting: Radiation Oncology

## 2014-04-27 VITALS — BP 97/52 | HR 74 | Resp 16

## 2014-04-27 DIAGNOSIS — Z510 Encounter for antineoplastic radiation therapy: Secondary | ICD-10-CM | POA: Diagnosis not present

## 2014-04-27 DIAGNOSIS — C3412 Malignant neoplasm of upper lobe, left bronchus or lung: Secondary | ICD-10-CM

## 2014-04-27 NOTE — Progress Notes (Signed)
Weekly Management Note Current Dose:23.4 Gy  Projected Dose:63 Gy   Narrative:  Coughed up dark black clots last night and today. On aspirin. No lightheadedness. No bright red blood. Hgb and platelets stable earlier this week. Son in [place]/daughter wanted him to be checked out today.   Physical Findings:  Alert. No distress. In a wheelchair.  Vitals:  Filed Vitals:   04/27/14 1358  BP: 97/52  Pulse: 74  Resp: 16   Weight:  Wt Readings from Last 3 Encounters:  04/26/14 207 lb 9.6 oz (94.167 kg)  04/26/14 200 lb (90.719 kg)  04/19/14 203 lb 14.4 oz (92.488 kg)   Lab Results  Component Value Date   WBC 3.6* 04/25/2014   HGB 11.6* 04/25/2014   HCT 35.6* 04/25/2014   MCV 86.5 04/25/2014   PLT 221 04/25/2014   Lab Results  Component Value Date   CREATININE 0.76 04/16/2014   BUN 19 04/16/2014   NA 133* 04/16/2014   K 4.6 04/16/2014   CL 94* 04/16/2014   CO2 28 04/16/2014     Impression:  The patient is tolerating radiation.  Plan:  Continue treatment as planned. We discussed that his bronchus basically dead ended into tumor so he may very well be coughing up clots. We discussed that bright red blood in amounts as large as a handful would be cause for concern and warranted a 911 call. We discussed that with his age and medical comorbidities if he was having large amounts of hemoptysis, he would likely not be a candidate for life saving measures at that time if he survived. He and his son in law understood this and agreed to observation for now.  I asked him to elt [place] know if it got worse.  I did not stop his aspirin due to his cardiac history.

## 2014-04-27 NOTE — Telephone Encounter (Signed)
Walk in form completed at 1310 received reads "chofing blood".  Patient has RT appointment for RT to chest at 1315.  Called [facility] at 1330 and [name] [name] reports he is on the machine.  Notified of t he complaint of coughing blood.  [name] [name] will notify a radiation oncologist provider of his complaint.  Patient was seen here on 04-18-2014 and received C2 carboplatin/taxol on 04-25-2014.  Currently resides at a nursing facility.

## 2014-04-27 NOTE — Progress Notes (Signed)
Patient requesting to be seen by a physician because hemoptysis started last night. Patient reports coughing up "chunks" of bright red blood since last night. Reports increased shortness of breath causing him to increase his portable oxygen from 2 liters to 3. Denies difficulty swallowing. Denies headache, dizziness, nausea, vomiting, night sweats or weight loss. Faint hyperpigmentation without desquamation center of chest noted.

## 2014-04-28 ENCOUNTER — Ambulatory Visit
Admission: RE | Admit: 2014-04-28 | Discharge: 2014-04-28 | Disposition: A | Discharge status: Home / Self Care | Payer: Medicare Other | Source: Ambulatory Visit | Attending: Radiation Oncology | Admitting: Radiation Oncology

## 2014-04-28 DIAGNOSIS — Z510 Encounter for antineoplastic radiation therapy: Secondary | ICD-10-CM | POA: Diagnosis not present

## 2014-04-28 DIAGNOSIS — C3412 Malignant neoplasm of upper lobe, left bronchus or lung: Secondary | ICD-10-CM

## 2014-04-28 MED ORDER — RADIAPLEXRX EX GEL
Freq: Once | CUTANEOUS | Status: AC
  Administered 2014-04-28: 09:00:00 via TOPICAL

## 2014-04-30 ENCOUNTER — Other Ambulatory Visit: Payer: Self-pay | Admitting: Oncology

## 2014-05-02 ENCOUNTER — Ambulatory Visit
Admission: RE | Admit: 2014-05-02 | Discharge: 2014-05-02 | Disposition: A | Discharge status: Home / Self Care | Payer: Medicare Other | Source: Ambulatory Visit | Attending: Radiation Oncology | Admitting: Radiation Oncology

## 2014-05-02 ENCOUNTER — Ambulatory Visit (HOSPITAL_BASED_OUTPATIENT_CLINIC_OR_DEPARTMENT_OTHER): Payer: Medicare Other

## 2014-05-02 ENCOUNTER — Other Ambulatory Visit (HOSPITAL_BASED_OUTPATIENT_CLINIC_OR_DEPARTMENT_OTHER): Payer: Medicare Other

## 2014-05-02 ENCOUNTER — Ambulatory Visit (HOSPITAL_BASED_OUTPATIENT_CLINIC_OR_DEPARTMENT_OTHER): Payer: Medicare Other | Admitting: Nurse Practitioner

## 2014-05-02 ENCOUNTER — Ambulatory Visit: Payer: Medicare Other

## 2014-05-02 VITALS — BP 102/58 | HR 80 | Temp 97.4°F | Resp 18 | Ht 71.0 in | Wt 200.9 lb

## 2014-05-02 DIAGNOSIS — Z510 Encounter for antineoplastic radiation therapy: Secondary | ICD-10-CM | POA: Diagnosis not present

## 2014-05-02 DIAGNOSIS — C3412 Malignant neoplasm of upper lobe, left bronchus or lung: Secondary | ICD-10-CM

## 2014-05-02 DIAGNOSIS — Z5111 Encounter for antineoplastic chemotherapy: Secondary | ICD-10-CM

## 2014-05-02 DIAGNOSIS — C3410 Malignant neoplasm of upper lobe, unspecified bronchus or lung: Secondary | ICD-10-CM

## 2014-05-02 DIAGNOSIS — G589 Mononeuropathy, unspecified: Secondary | ICD-10-CM

## 2014-05-02 DIAGNOSIS — Z87898 Personal history of other specified conditions: Secondary | ICD-10-CM

## 2014-05-02 DIAGNOSIS — N189 Chronic kidney disease, unspecified: Secondary | ICD-10-CM

## 2014-05-02 LAB — CBC WITH DIFFERENTIAL/PLATELET
BASO%: 0.6 % (ref 0.0–2.0)
Basophils Absolute: 0 10*3/uL (ref 0.0–0.1)
EOS%: 2.2 % (ref 0.0–7.0)
Eosinophils Absolute: 0.1 10*3/uL (ref 0.0–0.5)
HCT: 37.1 % — ABNORMAL LOW (ref 38.4–49.9)
HGB: 12 g/dL — ABNORMAL LOW (ref 13.0–17.1)
LYMPH%: 29.8 % (ref 14.0–49.0)
MCH: 27.8 pg (ref 27.2–33.4)
MCHC: 32.3 g/dL (ref 32.0–36.0)
MCV: 85.9 fL (ref 79.3–98.0)
MONO#: 0.4 10*3/uL (ref 0.1–0.9)
MONO%: 12.7 % (ref 0.0–14.0)
NEUT#: 1.7 10*3/uL (ref 1.5–6.5)
NEUT%: 54.7 % (ref 39.0–75.0)
PLATELETS: 163 10*3/uL (ref 140–400)
RBC: 4.32 10*6/uL (ref 4.20–5.82)
RDW: 15.2 % — AB (ref 11.0–14.6)
WBC: 3.2 10*3/uL — ABNORMAL LOW (ref 4.0–10.3)
lymph#: 0.9 10*3/uL (ref 0.9–3.3)

## 2014-05-02 LAB — COMPREHENSIVE METABOLIC PANEL (CC13)
ALBUMIN: 3.1 g/dL — AB (ref 3.5–5.0)
ALK PHOS: 51 U/L (ref 40–150)
ALT: 18 U/L (ref 0–55)
AST: 17 U/L (ref 5–34)
Anion Gap: 10 mEq/L (ref 3–11)
BILIRUBIN TOTAL: 0.74 mg/dL (ref 0.20–1.20)
BUN: 11.3 mg/dL (ref 7.0–26.0)
CO2: 25 mEq/L (ref 22–29)
Calcium: 10.2 mg/dL (ref 8.4–10.4)
Chloride: 100 mEq/L (ref 98–109)
Creatinine: 1.1 mg/dL (ref 0.7–1.3)
Glucose: 215 mg/dl — ABNORMAL HIGH (ref 70–140)
POTASSIUM: 4 meq/L (ref 3.5–5.1)
SODIUM: 136 meq/L (ref 136–145)
TOTAL PROTEIN: 7 g/dL (ref 6.4–8.3)

## 2014-05-02 MED ORDER — SODIUM CHLORIDE 0.9 % IV SOLN
214.6000 mg | Freq: Once | INTRAVENOUS | Status: AC
  Administered 2014-05-02: 210 mg via INTRAVENOUS
  Filled 2014-05-02: qty 21

## 2014-05-02 MED ORDER — PACLITAXEL CHEMO INJECTION 300 MG/50ML
45.0000 mg/m2 | Freq: Once | INTRAVENOUS | Status: AC
  Administered 2014-05-02: 102 mg via INTRAVENOUS
  Filled 2014-05-02: qty 17

## 2014-05-02 MED ORDER — SODIUM CHLORIDE 0.9 % IJ SOLN
10.0000 mL | INTRAMUSCULAR | Status: DC | PRN
  Administered 2014-05-02: 10 mL
  Filled 2014-05-02: qty 10

## 2014-05-02 MED ORDER — FAMOTIDINE IN NACL 20-0.9 MG/50ML-% IV SOLN
INTRAVENOUS | Status: AC
  Filled 2014-05-02: qty 50

## 2014-05-02 MED ORDER — FAMOTIDINE IN NACL 20-0.9 MG/50ML-% IV SOLN
20.0000 mg | Freq: Once | INTRAVENOUS | Status: AC
  Administered 2014-05-02: 20 mg via INTRAVENOUS

## 2014-05-02 MED ORDER — ONDANSETRON 16 MG/50ML IVPB (CHCC)
16.0000 mg | Freq: Once | INTRAVENOUS | Status: AC
  Administered 2014-05-02: 16 mg via INTRAVENOUS

## 2014-05-02 MED ORDER — SODIUM CHLORIDE 0.9 % IV SOLN
Freq: Once | INTRAVENOUS | Status: AC
  Administered 2014-05-02: 11:00:00 via INTRAVENOUS

## 2014-05-02 MED ORDER — DEXAMETHASONE SODIUM PHOSPHATE 10 MG/ML IJ SOLN
INTRAMUSCULAR | Status: AC
  Filled 2014-05-02: qty 1

## 2014-05-02 MED ORDER — DIPHENHYDRAMINE HCL 50 MG/ML IJ SOLN
INTRAMUSCULAR | Status: AC
  Filled 2014-05-02: qty 1

## 2014-05-02 MED ORDER — DEXAMETHASONE SODIUM PHOSPHATE 10 MG/ML IJ SOLN
10.0000 mg | Freq: Once | INTRAMUSCULAR | Status: AC
  Administered 2014-05-02: 10 mg via INTRAVENOUS

## 2014-05-02 MED ORDER — DIPHENHYDRAMINE HCL 50 MG/ML IJ SOLN
25.0000 mg | Freq: Once | INTRAMUSCULAR | Status: AC
  Administered 2014-05-02: 25 mg via INTRAVENOUS

## 2014-05-02 MED ORDER — ONDANSETRON 16 MG/50ML IVPB (CHCC)
INTRAVENOUS | Status: AC
  Filled 2014-05-02: qty 16

## 2014-05-02 MED ORDER — HEPARIN SOD (PORK) LOCK FLUSH 100 UNIT/ML IV SOLN
500.0000 [IU] | Freq: Once | INTRAVENOUS | Status: AC | PRN
  Administered 2014-05-02: 500 [IU]
  Filled 2014-05-02: qty 5

## 2014-05-02 NOTE — Progress Notes (Signed)
  [name] [name] OFFICE PROGRESS NOTE   Diagnosis:  Non-small cell lung cancer.  INTERVAL HISTORY:   [name] [name] returns as scheduled. Weekly Taxol/carboplatin was initiated 04/18/2014. He denies nausea/vomiting. No mouth sores. He has intermittent loose stools. No numbness in his hands or feet. He notes tingling in his feet at bedtime. He has a good appetite. He denies shortness of breath. No fever. He continues of oxygen at 3 L per minute. Last week he had 2 or 3 episodes of a small amount of blood mixed with phlegm.  Objective:  Vital signs in last 24 hours:  Blood pressure 102/58, pulse 80, temperature 97.4 F (36.3 C), temperature source Oral, resp. rate 18, height 5\' 11"  (1.803 m), weight 200 lb 14.4 oz (91.128 kg).    HEENT: No thrush or ulcerations. Resp: Lungs with expiratory wheezes. No respiratory distress. Cardio: Regular cardiac rhythm. GI: Abdomen soft and nontender. No organomegaly. Vascular: No leg edema. Calves nontender.  Port-A-Cath site without erythema.     Lab Results:  Lab Results  Component Value Date   WBC 3.2* 05/02/2014   HGB 12.0* 05/02/2014   HCT 37.1* 05/02/2014   MCV 85.9 05/02/2014   PLT 163 05/02/2014   NEUTROABS 1.7 05/02/2014    Imaging:  No results found.  Medications: I have reviewed the patient's current medications.  Assessment/Plan: 1. 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  Initiation of chest radiation 04/05/2014. Initiation of weekly Taxol/carboplatin chemotherapy 04/18/2014. 2. Admission 04/10/2014 with acute CHF and acute coronary syndrome, status post a cardiac catheterization 04/10/2014, clinical improvement with medical therapy.  3. COPD  4. Chronic renal failure  5. Diabetes  6. Neuropathy  7. History of non-Hodgkin's lymphoma      Disposition: [name] [name] appears stable. He continues radiation. Plan to continue weekly Taxol/carboplatin. We will see him in followup in 2 weeks. He will contact the office in the interim with any problems. He understands to contact the office with increased hemoptysis.  Plan reviewed with Dr. Benay Spice.    Ned Card ANP/GNP-BC   05/02/2014  11:06 AM

## 2014-05-03 ENCOUNTER — Ambulatory Visit
Admission: RE | Admit: 2014-05-03 | Discharge: 2014-05-03 | Disposition: A | Discharge status: Home / Self Care | Payer: Medicare Other | Source: Ambulatory Visit | Attending: Radiation Oncology | Admitting: Radiation Oncology

## 2014-05-03 ENCOUNTER — Encounter: Payer: Self-pay | Admitting: Radiation Oncology

## 2014-05-03 ENCOUNTER — Ambulatory Visit: Payer: Medicare Other

## 2014-05-03 VITALS — BP 112/61 | HR 63 | Resp 20 | Wt 202.8 lb

## 2014-05-03 DIAGNOSIS — Z510 Encounter for antineoplastic radiation therapy: Secondary | ICD-10-CM | POA: Diagnosis not present

## 2014-05-03 DIAGNOSIS — C3412 Malignant neoplasm of upper lobe, left bronchus or lung: Secondary | ICD-10-CM

## 2014-05-03 NOTE — Progress Notes (Signed)
  Radiation Oncology         [phone]) [phone] ________________________________  Name: [name] [name] MRN: [id number]  Date: 05/03/2014  DOB: 28-Sep-1933  Weekly Radiation Therapy Management  Lung cancer, upper lobe    Primary site: Lung   Staging method: AJCC 7th Edition   Clinical: Stage Unknown (TX, N1, M0)    Summary: Stage Unknown (TX, N1, M0)   Current Dose: 28.8 Gy     Planned Dose:  63 Gy  Narrative . . . . . . . . The patient presents for routine under treatment assessment.                                   The patient is without complaint. No swallowing difficulties or pain with swallowing. Patient is on 3 L of oxygen. He is ambulatory. Received radiosensitizing chemotherapy yesterday..                                 Set-up films were reviewed.                                 The chart was checked. Physical Findings. . .  weight is 202 lb 12.8 oz (91.989 kg). His blood pressure is 112/61 and his pulse is 63. His respiration is 20 and oxygen saturation is 95%. . Weight essentially stable.  The lungs are clear. The heart has a regular rhythm and rate.  Impression . . . . . . . The patient is tolerating radiation. Plan . . . . . . . . . . . . Continue treatment as planned.  ________________________________   Blair Promise, PhD, MD

## 2014-05-03 NOTE — Progress Notes (Signed)
Continuous oxygen therapy 3 liter via nasal cannula and portable tank. Patient reports that he is no longer coughing up chunks of blood. No skin changes noted to chest or upper back. Reports using radiaplex bid as directed. Vitals and weight stable. Denies pain.

## 2014-05-04 ENCOUNTER — Ambulatory Visit
Admission: RE | Admit: 2014-05-04 | Discharge: 2014-05-04 | Disposition: A | Discharge status: Home / Self Care | Payer: Medicare Other | Source: Ambulatory Visit | Attending: Radiation Oncology | Admitting: Radiation Oncology

## 2014-05-04 ENCOUNTER — Telehealth: Payer: Self-pay | Admitting: Oncology

## 2014-05-04 ENCOUNTER — Ambulatory Visit: Payer: Medicare Other

## 2014-05-04 DIAGNOSIS — Z510 Encounter for antineoplastic radiation therapy: Secondary | ICD-10-CM | POA: Diagnosis not present

## 2014-05-04 NOTE — Telephone Encounter (Signed)
lvm for pt regarding to July and Aug appt...advised pt to pick up new sched b4 or after radiation

## 2014-05-05 ENCOUNTER — Ambulatory Visit
Admission: RE | Admit: 2014-05-05 | Discharge: 2014-05-05 | Disposition: A | Discharge status: Home / Self Care | Payer: Medicare Other | Source: Ambulatory Visit | Attending: Radiation Oncology | Admitting: Radiation Oncology

## 2014-05-05 DIAGNOSIS — Z510 Encounter for antineoplastic radiation therapy: Secondary | ICD-10-CM | POA: Diagnosis not present

## 2014-05-06 ENCOUNTER — Non-Acute Institutional Stay (SKILLED_NURSING_FACILITY): Payer: Medicare Other | Admitting: Internal Medicine

## 2014-05-06 ENCOUNTER — Ambulatory Visit
Admission: RE | Admit: 2014-05-06 | Discharge: 2014-05-06 | Disposition: A | Discharge status: Home / Self Care | Payer: Medicare Other | Source: Ambulatory Visit | Attending: Radiation Oncology | Admitting: Radiation Oncology

## 2014-05-06 DIAGNOSIS — Z510 Encounter for antineoplastic radiation therapy: Secondary | ICD-10-CM | POA: Diagnosis not present

## 2014-05-06 DIAGNOSIS — R0602 Shortness of breath: Secondary | ICD-10-CM

## 2014-05-06 DIAGNOSIS — C3410 Malignant neoplasm of upper lobe, unspecified bronchus or lung: Secondary | ICD-10-CM

## 2014-05-06 DIAGNOSIS — E118 Type 2 diabetes mellitus with unspecified complications: Secondary | ICD-10-CM

## 2014-05-06 DIAGNOSIS — I2510 Atherosclerotic heart disease of native coronary artery without angina pectoris: Secondary | ICD-10-CM

## 2014-05-06 DIAGNOSIS — E1165 Type 2 diabetes mellitus with hyperglycemia: Secondary | ICD-10-CM

## 2014-05-06 DIAGNOSIS — IMO0002 Reserved for concepts with insufficient information to code with codable children: Secondary | ICD-10-CM

## 2014-05-06 DIAGNOSIS — E782 Mixed hyperlipidemia: Secondary | ICD-10-CM

## 2014-05-06 NOTE — Progress Notes (Signed)
Patient ID: [name] [name], male   DOB: Jun 03, 1933, 80 y.o.   MRN: [id number]   Location:  [facility]  PCP: [name], MD  Code Status: DNR  No Known Allergies  Chief Complaint  Patient presents with  . Discharge Note    HPI:  80 y/o male patiet with h/o Non Hodgkin's lymphoma x 2, COPD, CAD, PAD s/p aortobifemoral bypass, DMII, GI bleeding, and left upper lobe carcinoma of the lung on chemotherapy with carboplatin/paclitaxel and radiation treatments was here for short term rehabilitation. He has worked well with therapy team and is stable to be discharged home now. He continues to be on oxygen. He is getting around without any assistive device. Pain under control with current regimen. Marland Kitchen He going to radiation today.   ROS: Constitutional: Negative for fever.  Eyes: Positive for blurred vision. States he need new eyeglasses.  Respiratory: Positive for shortness of breath with exertion. Negative for cough.   Cardiovascular: Negative for chest pain and leg swelling.  Gastrointestinal: Negative for abdominal pain, constipation, blood in stool and melena.  Genitourinary: Positive for frequency. Negative for dysuria.   Musculoskeletal: Negative for falls and myalgias.  Skin: Negative for rash.  Neurological: Negative for dizziness, loss of consciousness and headaches.  Psychiatric/Behavioral: denie depression at this time   Past Medical History  Diagnosis Date  . Peripheral arterial disease     s/p Ao Bifem Bypass  . Diabetes mellitus without complication   . CAD (coronary artery disease)     By  CT Scan  . Hyperlipidemia   . Hypertension   . Non Hodgkin's lymphoma 2000, 2010  . Peripheral neuropathy   . COPD (chronic obstructive pulmonary disease)   . History of lower GI bleeding   . Substance abuse   . Lung cancer, upper lobe     left lung  . AAA (abdominal aortic aneurysm)    Past Surgical History  Procedure Laterality Date  . Fracture surgery  2009      Left ankle  . Pr vein bypass graft,aorto-fem-pop  2005    Right common femoral to BK popliteal BPG  . Pr vein bypass graft,aorto-fem-pop  01-27-05    Revision of Right fem-pop BPG  . Cardiac catheterization  1990's  . Video bronchoscopy Bilateral 03/14/2014    Procedure: VIDEO BRONCHOSCOPY WITH FLUORO;  Surgeon: Rigoberto Noel, MD;  Location: [place];  Service: Cardiopulmonary;  Laterality: Bilateral;  . Nm myoview ltd  11/2013    Lexiscan.  EF 60%, small fixed inferoapical defect - ? artifact  . Transthoracic echocardiogram  11/2013    EF 60-65%, Gr 1 DD, Mod AS, Sever MAC - no MS.      Social History:   reports that he quit smoking about 10 years ago. His smoking use included Cigarettes. He has a 50 pack-year smoking history. He has never used smokeless tobacco. He reports that he does not drink alcohol or use illicit drugs.  Family History  Problem Relation Age of Onset  . Cancer Mother     Colon  . Heart attack Mother   . Stroke Father   . Hyperlipidemia Father   . Hyperlipidemia Sister   . Hyperlipidemia Brother   . Hyperlipidemia Daughter   . Hypertension Son    Current Outpatient Prescriptions on File Prior to Visit  Medication Sig Dispense Refill  . Acetaminophen (TYLENOL ARTHRITIS PAIN PO) Take 650 mg by mouth 2 (two) times daily.       Marland Kitchen  aspirin 81 MG tablet Take 160 mg by mouth 2 (two) times daily.      Marland Kitchen CINNAMON PO Take 1,000 mg by mouth 2 (two) times daily.      . DULoxetine (CYMBALTA) 30 MG capsule Take 30 mg by mouth daily.      . fenofibrate micronized (LOFIBRA) 200 MG capsule Take 200 mg by mouth daily before breakfast.      . finasteride (PROSCAR) 5 MG tablet Take 5 mg by mouth daily.      . furosemide (LASIX) 20 MG tablet Take 1 tablet (20 mg total) by mouth daily as needed for fluid or edema.  30 tablet    . gabapentin (NEURONTIN) 600 MG tablet Take 600 mg by mouth 3 (three) times daily.       Marland Kitchen glucosamine-chondroitin 500-400 MG tablet Take 1 tablet by  mouth 2 (two) times daily.      . hyaluronate sodium (RADIAPLEXRX) GEL Apply 1 application topically 2 (two) times daily.      . insulin aspart (NOVOLOG) 100 UNIT/ML injection Inject 5 Units into the skin 3 (three) times daily with meals.  10 mL  11  . insulin glargine (LANTUS) 100 UNIT/ML injection Inject 0.22 mLs (22 Units total) into the skin at bedtime.  10 mL  11  . Ipratropium-Albuterol (COMBIVENT) 20-100 MCG/ACT AERS respimat Inhale 1 puff into the lungs every 6 (six) hours. As needed for wheezing-  1 Inhaler  0  . ipratropium-albuterol (DUONEB) 0.5-2.5 (3) MG/3ML SOLN Take 3 mLs by nebulization 3 (three) times daily.  360 mL  6  . lidocaine-prilocaine (EMLA) cream Apply 1 application topically as needed. Apply 1-2 hours prior to stick and cover with plastic wrap  30 g  5  . metoprolol tartrate (LOPRESSOR) 25 MG tablet Take 1 tablet (25 mg total) by mouth 2 (two) times daily.  30 tablet  0  . nitroGLYCERIN (NITROLINGUAL) 0.4 MG/SPRAY spray Place 1 spray under the tongue every 5 (five) minutes x 3 doses as needed for chest pain.  12 g  12  . oxyCODONE-acetaminophen (PERCOCET/ROXICET) 5-325 MG per tablet Take 1 tablet by mouth every 4 (four) hours as needed for severe pain.  30 tablet  0  . prochlorperazine (COMPAZINE) 5 MG tablet Take 1 tablet (5 mg total) by mouth every 6 (six) hours as needed for nausea or vomiting.  60 tablet  1  . rosuvastatin (CRESTOR) 10 MG tablet Take 10 mg by mouth at bedtime.       . Tamsulosin HCl (FLOMAX) 0.4 MG CAPS Take 0.4 mg by mouth daily.      Marland Kitchen ULTRA-THIN II SHORT PEN NEEDLE 31G X 8 MM MISC        No current facility-administered medications on file prior to visit.   Physical Exam: Filed Vitals:   05/06/14 1214  BP: 102/53  Pulse: 83  Temp: 97.3 F (36.3 C)  Resp: 16  Height: 5\' 11"  (1.803 m)  Weight: 202 lb (91.627 kg)  SpO2: 98%   Constitutional: He appears well-developed and well-nourished and in no acute distress HEENT:  Normal Cardiovascular: Normal rate and regular rhythm.  2/6 systolic murmur Pulmonary/Chest: Effort normal. No respiratory distress. Wheezing present Abdominal: Soft. Bowel sounds are normal. He exhibits no distension and no mass. There is no tenderness.  Musculoskeletal: Normal range of motion. He exhibits no edema and no tenderness.  Neurological: He is alert and oriented to person, place, and time. No cranial focal deficit.  Skin: warm and  dry. Palpable Porta Cath right upper chest. No signs of infection on outside Psychiatric: He has a normal mood and affect. His behavior is normal. Judgment and thought content normal.   Labs reviewed:  Basic Metabolic Panel:  Recent Labs  04/14/14 0525 04/15/14 0525 04/16/14 0508 05/02/14 0936  NA 132* 128* 133* 136  K 4.3 4.2 4.6 4.0  CL 92* 89* 94*  --   CO2 30 28 28 25   GLUCOSE 252* 249* 257* 215*  BUN 19 19 19  11.3  CREATININE 0.75 0.69 0.76 1.1  CALCIUM 9.1 9.0 9.8 10.2  MG 1.5 1.3* 1.4*  --    Liver Function Tests:  Recent Labs  04/13/14 0315 04/14/14 0525 05/02/14 0936  AST 15 13 17   ALT 16 11 18   ALKPHOS 52 53 51  BILITOT 1.2 1.3* 0.74  PROT 6.2 5.9* 7.0  ALBUMIN 2.7* 2.6* 3.1*  CBC:  Recent Labs  04/18/14 0854 04/25/14 0952 05/02/14 0936  WBC 4.7 3.6* 3.2*  NEUTROABS 2.8 2.3 1.7  HGB 12.8* 11.6* 12.0*  HCT 39.0 35.6* 37.1*  MCV 86.5 86.5 85.9  PLT 165 221 163   04/13/14: A1C 9.3  Assessment/Plan   80 y/o male patient is stable to be discharged home. Ambulates independently. No home health services required at present. Script for oxygen via nasal canula provided. Does not need any other DME equipments. 30 days script on medications provided. Aspirin 81 mg daily and cinnamon supplement to be obtained OTC. Script for glucometer, lancets, test strips, syringes and needle provided. Patient to follow with PCP in 1-2 weeks post discharge  1. Malignant neoplasm of upper lobe of left lung is on XRT and chemotherapy.  Continue f/u with oncology. Continue bronchodilator treatment and o2  2. DIABETES MELLITUS, TYPE II, ON INSULIN continue lantus 22 units and novolog 5 u premeal. Last a1c 9.3 on 04/14/15. Monitor cbg. To follow with pcp   3. Depression Stable, continue cymbalta 30mg   4. HYPERLIPIDEMIA Stable. Continue crestor and fenofibrate for lipid control.   5. NEUROPATHY Persists. Continue gabapentin--likely from his chemo, but could also have some component of diabetic neuropathy, as well  6. CAD s/p cath in the past, continue secondary prevention of MI with baby asa, statin,and metoprolol.   7. Exertional Dyspnea Is going home with 2L of continuous oxygen therapy. Continue Combivent PRN for wheezes  Plan of care discuss with patient and nursing staff. Patient and nursing staff verbalize understanding and agree with plan of care

## 2014-05-06 NOTE — Progress Notes (Signed)
Results sent to [facility].

## 2014-05-08 ENCOUNTER — Other Ambulatory Visit: Payer: Self-pay | Admitting: Oncology

## 2014-05-09 ENCOUNTER — Ambulatory Visit (HOSPITAL_BASED_OUTPATIENT_CLINIC_OR_DEPARTMENT_OTHER): Payer: Medicare Other

## 2014-05-09 ENCOUNTER — Other Ambulatory Visit (HOSPITAL_BASED_OUTPATIENT_CLINIC_OR_DEPARTMENT_OTHER): Payer: Medicare Other

## 2014-05-09 ENCOUNTER — Ambulatory Visit
Admission: RE | Admit: 2014-05-09 | Discharge: 2014-05-09 | Disposition: A | Discharge status: Home / Self Care | Payer: Medicare Other | Source: Ambulatory Visit | Attending: Radiation Oncology | Admitting: Radiation Oncology

## 2014-05-09 VITALS — BP 94/56 | HR 69 | Temp 97.5°F | Resp 18

## 2014-05-09 DIAGNOSIS — Z5111 Encounter for antineoplastic chemotherapy: Secondary | ICD-10-CM

## 2014-05-09 DIAGNOSIS — Z510 Encounter for antineoplastic radiation therapy: Secondary | ICD-10-CM | POA: Diagnosis not present

## 2014-05-09 DIAGNOSIS — C3410 Malignant neoplasm of upper lobe, unspecified bronchus or lung: Secondary | ICD-10-CM

## 2014-05-09 LAB — CBC WITH DIFFERENTIAL/PLATELET
BASO%: 1 % (ref 0.0–2.0)
BASOS ABS: 0 10*3/uL (ref 0.0–0.1)
EOS ABS: 0 10*3/uL (ref 0.0–0.5)
EOS%: 0.6 % (ref 0.0–7.0)
HCT: 36 % — ABNORMAL LOW (ref 38.4–49.9)
HGB: 11.7 g/dL — ABNORMAL LOW (ref 13.0–17.1)
LYMPH%: 16.5 % (ref 14.0–49.0)
MCH: 28.5 pg (ref 27.2–33.4)
MCHC: 32.4 g/dL (ref 32.0–36.0)
MCV: 88 fL (ref 79.3–98.0)
MONO#: 0.6 10*3/uL (ref 0.1–0.9)
MONO%: 16.2 % — ABNORMAL HIGH (ref 0.0–14.0)
NEUT%: 65.7 % (ref 39.0–75.0)
NEUTROS ABS: 2.3 10*3/uL (ref 1.5–6.5)
PLATELETS: 200 10*3/uL (ref 140–400)
RBC: 4.09 10*6/uL — AB (ref 4.20–5.82)
RDW: 16.1 % — ABNORMAL HIGH (ref 11.0–14.6)
WBC: 3.5 10*3/uL — ABNORMAL LOW (ref 4.0–10.3)
lymph#: 0.6 10*3/uL — ABNORMAL LOW (ref 0.9–3.3)

## 2014-05-09 LAB — COMPREHENSIVE METABOLIC PANEL (CC13)
ALT: 19 U/L (ref 0–55)
AST: 18 U/L (ref 5–34)
Albumin: 3 g/dL — ABNORMAL LOW (ref 3.5–5.0)
Alkaline Phosphatase: 50 U/L (ref 40–150)
Anion Gap: 10 mEq/L (ref 3–11)
BILIRUBIN TOTAL: 0.82 mg/dL (ref 0.20–1.20)
BUN: 15.2 mg/dL (ref 7.0–26.0)
CO2: 23 mEq/L (ref 22–29)
Calcium: 9 mg/dL (ref 8.4–10.4)
Chloride: 103 mEq/L (ref 98–109)
Creatinine: 1.1 mg/dL (ref 0.7–1.3)
GLUCOSE: 273 mg/dL — AB (ref 70–140)
Potassium: 3.7 mEq/L (ref 3.5–5.1)
Sodium: 136 mEq/L (ref 136–145)
Total Protein: 6.8 g/dL (ref 6.4–8.3)

## 2014-05-09 MED ORDER — DEXAMETHASONE SODIUM PHOSPHATE 10 MG/ML IJ SOLN
INTRAMUSCULAR | Status: AC
  Filled 2014-05-09: qty 1

## 2014-05-09 MED ORDER — FAMOTIDINE IN NACL 20-0.9 MG/50ML-% IV SOLN
INTRAVENOUS | Status: AC
  Filled 2014-05-09: qty 50

## 2014-05-09 MED ORDER — ONDANSETRON 16 MG/50ML IVPB (CHCC)
16.0000 mg | Freq: Once | INTRAVENOUS | Status: AC
  Administered 2014-05-09: 16 mg via INTRAVENOUS

## 2014-05-09 MED ORDER — SODIUM CHLORIDE 0.9 % IV SOLN
Freq: Once | INTRAVENOUS | Status: AC
  Administered 2014-05-09: 10:00:00 via INTRAVENOUS

## 2014-05-09 MED ORDER — SODIUM CHLORIDE 0.9 % IJ SOLN
10.0000 mL | INTRAMUSCULAR | Status: DC | PRN
  Administered 2014-05-09: 10 mL
  Filled 2014-05-09: qty 10

## 2014-05-09 MED ORDER — ONDANSETRON 16 MG/50ML IVPB (CHCC)
INTRAVENOUS | Status: AC
  Filled 2014-05-09: qty 16

## 2014-05-09 MED ORDER — PACLITAXEL CHEMO INJECTION 300 MG/50ML
45.0000 mg/m2 | Freq: Once | INTRAVENOUS | Status: AC
  Administered 2014-05-09: 102 mg via INTRAVENOUS
  Filled 2014-05-09: qty 17

## 2014-05-09 MED ORDER — FAMOTIDINE IN NACL 20-0.9 MG/50ML-% IV SOLN
20.0000 mg | Freq: Once | INTRAVENOUS | Status: AC
  Administered 2014-05-09: 20 mg via INTRAVENOUS

## 2014-05-09 MED ORDER — DEXAMETHASONE SODIUM PHOSPHATE 10 MG/ML IJ SOLN
10.0000 mg | Freq: Once | INTRAMUSCULAR | Status: AC
  Administered 2014-05-09: 10 mg via INTRAVENOUS

## 2014-05-09 MED ORDER — CARBOPLATIN CHEMO INJECTION 450 MG/45ML
214.6000 mg | Freq: Once | INTRAVENOUS | Status: AC
  Administered 2014-05-09: 210 mg via INTRAVENOUS
  Filled 2014-05-09: qty 21

## 2014-05-09 MED ORDER — DIPHENHYDRAMINE HCL 50 MG/ML IJ SOLN
25.0000 mg | Freq: Once | INTRAMUSCULAR | Status: AC
  Administered 2014-05-09: 25 mg via INTRAVENOUS

## 2014-05-09 MED ORDER — HEPARIN SOD (PORK) LOCK FLUSH 100 UNIT/ML IV SOLN
500.0000 [IU] | Freq: Once | INTRAVENOUS | Status: AC | PRN
  Administered 2014-05-09: 500 [IU]
  Filled 2014-05-09: qty 5

## 2014-05-09 MED ORDER — DIPHENHYDRAMINE HCL 50 MG/ML IJ SOLN
INTRAMUSCULAR | Status: AC
  Filled 2014-05-09: qty 1

## 2014-05-09 NOTE — Patient Instructions (Signed)
[facility] Discharge Instructions for Patients Receiving Chemotherapy  Today you received the following chemotherapy agents Taxol and Carboplatin.  To help prevent nausea and vomiting after your treatment, we encourage you to take your nausea medication.   If you develop nausea and vomiting that is not controlled by your nausea medication, call the clinic.   BELOW ARE SYMPTOMS THAT SHOULD BE REPORTED IMMEDIATELY:  *FEVER GREATER THAN 100.5 F  *CHILLS WITH OR WITHOUT FEVER  NAUSEA AND VOMITING THAT IS NOT CONTROLLED WITH YOUR NAUSEA MEDICATION  *UNUSUAL SHORTNESS OF BREATH  *UNUSUAL BRUISING OR BLEEDING  TENDERNESS IN MOUTH AND THROAT WITH OR WITHOUT PRESENCE OF ULCERS  *URINARY PROBLEMS  *BOWEL PROBLEMS  UNUSUAL RASH Items with * indicate a potential emergency and should be followed up as soon as possible.  Feel free to call the clinic you have any questions or concerns. The clinic phone number is [phone].    

## 2014-05-10 ENCOUNTER — Ambulatory Visit
Admission: RE | Admit: 2014-05-10 | Discharge: 2014-05-10 | Disposition: A | Discharge status: Home / Self Care | Payer: Medicare Other | Source: Ambulatory Visit | Attending: Radiation Oncology | Admitting: Radiation Oncology

## 2014-05-10 ENCOUNTER — Telehealth: Payer: Self-pay | Admitting: *Deleted

## 2014-05-10 ENCOUNTER — Encounter: Payer: Self-pay | Admitting: Radiation Oncology

## 2014-05-10 VITALS — BP 98/76 | HR 91 | Temp 97.4°F | Ht 71.0 in | Wt 201.1 lb

## 2014-05-10 DIAGNOSIS — Z510 Encounter for antineoplastic radiation therapy: Secondary | ICD-10-CM | POA: Diagnosis not present

## 2014-05-10 DIAGNOSIS — C3492 Malignant neoplasm of unspecified part of left bronchus or lung: Secondary | ICD-10-CM

## 2014-05-10 NOTE — Telephone Encounter (Signed)
Message copied by Wardell Heath on Tue May 10, 2014 10:15 AM ------      Message from: Ned Card K      Created: Tue May 10, 2014  9:00 AM       Please call him and see how blood sugar is today.      ----- Message -----         From: [facility] Interface         Sent: 05/09/2014   9:08 AM           To: Owens Shark, NP                   ------

## 2014-05-10 NOTE — Telephone Encounter (Signed)
Call from pt's son reporting blood sugar today was "in the 260's" Pt missed insulin dose yesterday because he didn't have needles to fit his injector. They have since found them. Sees PCP 7/24. Recommended they make PCP aware of blood sugars. He voiced understanding.

## 2014-05-10 NOTE — Progress Notes (Signed)
  Radiation Oncology         [phone] ________________________________  Name: [name] [name] MRN: [id number]  Date: 05/10/2014  DOB: Sep 22, 1933  Weekly Radiation Therapy Management  Current Dose: 37.8 Gy     Planned Dose:  63 Gy  Narrative . . . . . . . . The patient presents for routine under treatment assessment.                                   The patient is without complaint. He denies any pain with swallowing or difficulty swallowing. He continues on 2 L of oxygen is comfortable with this level. He feels his breathing is a little better and felt good this past weekend                                 Set-up films were reviewed.                                 The chart was checked. Physical Findings. . .  height is 5\' 11"  (1.803 m) and weight is 201 lb 1.6 oz (91.218 kg). His oral temperature is 97.4 F (36.3 C). His blood pressure is 98/76 and his pulse is 91. His oxygen saturation is 99%. . Weight essentially stable.  No significant changes. No wheezing noted on exam today Impression . . . . . . . The patient is tolerating radiation. Plan . . . . . . . . . . . . Continue treatment as planned.  ________________________________   Blair Promise, PhD, MD

## 2014-05-10 NOTE — Telephone Encounter (Signed)
Call from pt's son- returning our call. He will find out what blood sugar was this AM and call back.

## 2014-05-10 NOTE — Telephone Encounter (Signed)
Called and informed patient's son Nicole Kindred) to contact patient's PCP and inform him of blood sugar.  Patient's son Nicole Kindred) verbalized understanding.

## 2014-05-10 NOTE — Telephone Encounter (Signed)
Left  Message for patient to call back

## 2014-05-10 NOTE — Telephone Encounter (Signed)
Message copied by Wardell Heath on Tue May 10, 2014  2:54 PM ------      Message from: Jenison, Yardley K      Created: Tue May 10, 2014  9:00 AM       Please call him and see how blood sugar is today.      ----- Message -----         From: [facility] Interface         Sent: 05/09/2014   9:08 AM           To: Owens Shark, NP                   ------

## 2014-05-10 NOTE — Progress Notes (Signed)
[name] [name] had completed 21 fractions to his left chest.  He reports pain in his right shoulder from putting his arms up during treatment.  He takes percocet as needed for the pain.  He reports he is now living with his ex wife.  He reports feeling like his food gets "stuck" in his chest if he takes big bites.  He denies a sore throat or issues swallowing.  He denies an increase in coughing.  He denies hemoptysis.  He reports shortness of breath with activity.  His oxygen saturation on 2 l of oxygen is 99%.  He had chemotherapy on Monday.  The skin on his left chest and left back is pink.  He is using radiaplex twice a day.

## 2014-05-10 NOTE — Telephone Encounter (Signed)
Error

## 2014-05-11 ENCOUNTER — Ambulatory Visit
Admission: RE | Admit: 2014-05-11 | Discharge: 2014-05-11 | Disposition: A | Discharge status: Home / Self Care | Payer: Medicare Other | Source: Ambulatory Visit | Attending: Radiation Oncology | Admitting: Radiation Oncology

## 2014-05-11 DIAGNOSIS — Z510 Encounter for antineoplastic radiation therapy: Secondary | ICD-10-CM | POA: Diagnosis not present

## 2014-05-12 ENCOUNTER — Ambulatory Visit
Admission: RE | Admit: 2014-05-12 | Discharge: 2014-05-12 | Disposition: A | Discharge status: Home / Self Care | Payer: Medicare Other | Source: Ambulatory Visit | Attending: Radiation Oncology | Admitting: Radiation Oncology

## 2014-05-12 DIAGNOSIS — Z510 Encounter for antineoplastic radiation therapy: Secondary | ICD-10-CM | POA: Diagnosis not present

## 2014-05-13 ENCOUNTER — Emergency Department (HOSPITAL_COMMUNITY)
Admission: EM | Admit: 2014-05-13 | Discharge: 2014-05-14 | Disposition: A | Discharge status: Home / Self Care | Payer: Medicare Other | Attending: Emergency Medicine | Admitting: Emergency Medicine

## 2014-05-13 ENCOUNTER — Ambulatory Visit
Admission: RE | Admit: 2014-05-13 | Discharge: 2014-05-13 | Disposition: A | Discharge status: Home / Self Care | Payer: Medicare Other | Source: Ambulatory Visit | Attending: Radiation Oncology | Admitting: Radiation Oncology

## 2014-05-13 ENCOUNTER — Emergency Department (HOSPITAL_COMMUNITY): Payer: Medicare Other

## 2014-05-13 ENCOUNTER — Encounter (HOSPITAL_COMMUNITY): Payer: Self-pay | Admitting: Emergency Medicine

## 2014-05-13 DIAGNOSIS — K2289 Other specified disease of esophagus: Secondary | ICD-10-CM

## 2014-05-13 DIAGNOSIS — E119 Type 2 diabetes mellitus without complications: Secondary | ICD-10-CM | POA: Insufficient documentation

## 2014-05-13 DIAGNOSIS — I10 Essential (primary) hypertension: Secondary | ICD-10-CM | POA: Insufficient documentation

## 2014-05-13 DIAGNOSIS — Z7982 Long term (current) use of aspirin: Secondary | ICD-10-CM | POA: Insufficient documentation

## 2014-05-13 DIAGNOSIS — Z951 Presence of aortocoronary bypass graft: Secondary | ICD-10-CM | POA: Insufficient documentation

## 2014-05-13 DIAGNOSIS — Z79899 Other long term (current) drug therapy: Secondary | ICD-10-CM | POA: Insufficient documentation

## 2014-05-13 DIAGNOSIS — Z794 Long term (current) use of insulin: Secondary | ICD-10-CM | POA: Insufficient documentation

## 2014-05-13 DIAGNOSIS — I4891 Unspecified atrial fibrillation: Secondary | ICD-10-CM | POA: Insufficient documentation

## 2014-05-13 DIAGNOSIS — G609 Hereditary and idiopathic neuropathy, unspecified: Secondary | ICD-10-CM | POA: Insufficient documentation

## 2014-05-13 DIAGNOSIS — I2510 Atherosclerotic heart disease of native coronary artery without angina pectoris: Secondary | ICD-10-CM | POA: Insufficient documentation

## 2014-05-13 DIAGNOSIS — Z87898 Personal history of other specified conditions: Secondary | ICD-10-CM | POA: Insufficient documentation

## 2014-05-13 DIAGNOSIS — J449 Chronic obstructive pulmonary disease, unspecified: Secondary | ICD-10-CM | POA: Insufficient documentation

## 2014-05-13 DIAGNOSIS — Z9889 Other specified postprocedural states: Secondary | ICD-10-CM | POA: Insufficient documentation

## 2014-05-13 DIAGNOSIS — Z510 Encounter for antineoplastic radiation therapy: Secondary | ICD-10-CM | POA: Diagnosis not present

## 2014-05-13 DIAGNOSIS — R0789 Other chest pain: Secondary | ICD-10-CM

## 2014-05-13 DIAGNOSIS — Z85118 Personal history of other malignant neoplasm of bronchus and lung: Secondary | ICD-10-CM | POA: Insufficient documentation

## 2014-05-13 DIAGNOSIS — K228 Other specified diseases of esophagus: Secondary | ICD-10-CM

## 2014-05-13 DIAGNOSIS — I480 Paroxysmal atrial fibrillation: Secondary | ICD-10-CM

## 2014-05-13 DIAGNOSIS — Z8719 Personal history of other diseases of the digestive system: Secondary | ICD-10-CM | POA: Insufficient documentation

## 2014-05-13 DIAGNOSIS — E785 Hyperlipidemia, unspecified: Secondary | ICD-10-CM | POA: Insufficient documentation

## 2014-05-13 DIAGNOSIS — Z87891 Personal history of nicotine dependence: Secondary | ICD-10-CM | POA: Insufficient documentation

## 2014-05-13 DIAGNOSIS — J4489 Other specified chronic obstructive pulmonary disease: Secondary | ICD-10-CM | POA: Insufficient documentation

## 2014-05-13 DIAGNOSIS — I252 Old myocardial infarction: Secondary | ICD-10-CM | POA: Insufficient documentation

## 2014-05-13 HISTORY — DX: Acute myocardial infarction, unspecified: I21.9

## 2014-05-13 LAB — PROTIME-INR
INR: 1.06 (ref 0.00–1.49)
PROTHROMBIN TIME: 13.8 s (ref 11.6–15.2)

## 2014-05-13 LAB — BASIC METABOLIC PANEL
ANION GAP: 16 — AB (ref 5–15)
BUN: 11 mg/dL (ref 6–23)
CO2: 25 meq/L (ref 19–32)
CREATININE: 0.71 mg/dL (ref 0.50–1.35)
Calcium: 8.2 mg/dL — ABNORMAL LOW (ref 8.4–10.5)
Chloride: 95 mEq/L — ABNORMAL LOW (ref 96–112)
GFR calc Af Amer: >90 mL/min (ref >90)
GFR calc non Af Amer: 87 mL/min — ABNORMAL LOW (ref >90)
Glucose, Bld: 223 mg/dL — ABNORMAL HIGH (ref 70–99)
Potassium: 3.5 mEq/L — ABNORMAL LOW (ref 3.7–5.3)
SODIUM: 136 meq/L — AB (ref 137–147)

## 2014-05-13 LAB — I-STAT TROPONIN, ED: TROPONIN I, POC: 0.02 ng/mL (ref 0.00–0.08)

## 2014-05-13 LAB — PRO B NATRIURETIC PEPTIDE: PRO B NATRI PEPTIDE: 1118 pg/mL — AB (ref 0–450)

## 2014-05-13 MED ORDER — SODIUM CHLORIDE 0.9 % IV BOLUS (SEPSIS)
1000.0000 mL | Freq: Once | INTRAVENOUS | Status: AC
  Administered 2014-05-13: 500 mL via INTRAVENOUS

## 2014-05-13 NOTE — ED Notes (Signed)
Pt c/o chest pain starting at 6pm. Rated it 2/10 in middle of chest non radiating. with EMS. 70/52 P152. CBG 238. 20G  In right wrist. Given 540ml bolus of LR. Last BP 101/57 P-148 97% on 2L. Wears 2L continuous at home. Clear lung sound in upper right and wheezing in left upper lobe. Has port in right upper chest. 325mg  asprin given.

## 2014-05-14 LAB — I-STAT TROPONIN, ED: Troponin i, poc: 0.03 ng/mL (ref 0.00–0.08)

## 2014-05-14 LAB — CBC
HCT: 30.2 % — ABNORMAL LOW (ref 39.0–52.0)
Hemoglobin: 9.8 g/dL — ABNORMAL LOW (ref 13.0–17.0)
MCH: 28.1 pg (ref 26.0–34.0)
MCHC: 32.5 g/dL (ref 30.0–36.0)
MCV: 86.5 fL (ref 78.0–100.0)
Platelets: 112 10*3/uL — ABNORMAL LOW (ref 150–400)
RBC: 3.49 MIL/uL — AB (ref 4.22–5.81)
RDW: 16 % — ABNORMAL HIGH (ref 11.5–15.5)
WBC: 2.1 10*3/uL — AB (ref 4.0–10.5)

## 2014-05-14 MED ORDER — ONDANSETRON HCL 4 MG/2ML IJ SOLN
4.0000 mg | Freq: Once | INTRAMUSCULAR | Status: AC
  Administered 2014-05-14: 4 mg via INTRAVENOUS
  Filled 2014-05-14: qty 2

## 2014-05-14 MED ORDER — SUCRALFATE 1 G PO TABS: 1.0000 g | ORAL_TABLET | Freq: Four times a day (QID) | ORAL | Status: AC

## 2014-05-14 NOTE — Discharge Instructions (Signed)
Please follow up with your cardiologist to discuss your atrial fibrillation, medications, and other changes to your current cardiac health.  Return to the ER for worsening condition or new concerning symptoms   Atrial Fibrillation Atrial fibrillation is a condition that causes your heart to beat irregularly. It may also cause your heart to beat faster than normal. Atrial fibrillation can prevent your heart from pumping blood normally. It increases your risk of stroke and heart problems. HOME CARE  Take medications as told by your doctor.  Only take medications that your doctor says are safe. Some medications can make the condition worse or happen again.  If blood thinners were prescribed by your doctor, take them exactly as told. Too much can cause bleeding. Too little and you will not have the needed protection against stroke and other problems.  Perform blood tests at home if told by your doctor.  Perform blood tests exactly as told by your doctor.  Do not drink alcohol.  Do not drink beverages with caffeine such as coffee, soda, and some teas.  Maintain a healthy weight.  Do not use diet pills unless your doctor says they are safe. They may make heart problems worse.  Follow diet instructions as told by your doctor.  Exercise regularly as told by your doctor.  Keep all follow-up appointments. GET HELP RIGHT AWAY IF:   You have chest or belly (abdominal) pain.  You feel sick to your stomach (nauseous)  You suddenly have swollen feet and ankles.  You feel dizzy.  You face, arms, or legs feel numb or weak.  There is a change in your vision or speech.  You notice a change in the speed, rhythm, or strength of your heartbeat.  You suddenly begin peeing (urinating) more often.  You get tired more easily when moving or exercising. MAKE SURE YOU:   Understand these instructions.  Will watch your condition.  Will get help right away if you are not doing well or get  worse. Document Released: 07/23/2008 Document Revised: 02/08/2013 Document Reviewed: 11/24/2012 [facility] Patient Information 2015 [place], [place]. This information is not intended to replace advice given to you by your health care provider. Make sure you discuss any questions you have with your health care provider.  Chest Pain Observation It is often hard to give a specific diagnosis for the cause of chest pain. Among other possibilities your symptoms might be caused by inadequate oxygen delivery to your heart (angina). Angina that is not treated or evaluated can lead to a heart attack (myocardial infarction) or death. Blood tests, electrocardiograms, and X-rays may have been done to help determine a possible cause of your chest pain. After evaluation and observation, your health care provider has determined that it is unlikely your pain was caused by an unstable condition that requires hospitalization. However, a full evaluation of your pain may need to be completed, with additional diagnostic testing as directed. It is very important to keep your follow-up appointments. Not keeping your follow-up appointments could result in permanent heart damage, disability, or death. If there is any problem keeping your follow-up appointments, you must call your health care provider. HOME CARE INSTRUCTIONS  Due to the slight chance that your pain could be angina, it is important to follow your health care provider's treatment plan and also maintain a healthy lifestyle:  Maintain or work toward achieving a healthy weight.  Stay physically active and exercise regularly.  Decrease your salt intake.  Eat a balanced, healthy diet. Talk  to a dietitian to learn about heart-healthy foods.  Increase your fiber intake by including whole grains, vegetables, fruits, and nuts in your diet.  Avoid situations that cause stress, anger, or depression.  Take medicines as advised by your health care provider. Report any  side effects to your health care provider. Do not stop medicines or adjust the dosages on your own.  Quit smoking. Do not use nicotine patches or gum until you check with your health care provider.  Keep your blood pressure, blood sugar, and cholesterol levels within normal limits.  Limit alcohol intake to no more than 1 drink per day for women who are not pregnant and 2 drinks per day for men.  Do not abuse drugs. SEEK IMMEDIATE MEDICAL CARE IF: You have severe chest pain or pressure which may include symptoms such as:  You feel pain or pressure in your arms, neck, jaw, or back.  You have severe back or abdominal pain, feel sick to your stomach (nauseous), or throw up (vomit).  You are sweating profusely.  You are having a fast or irregular heartbeat.  You feel short of breath while at rest.  You notice increasing shortness of breath during rest, sleep, or with activity.  You have chest pain that does not get better after rest or after taking your usual medicine.  You wake from sleep with chest pain.  You are unable to sleep because you cannot breathe.  You develop a frequent cough or you are coughing up blood.  You feel dizzy, faint, or experience extreme fatigue.  You develop severe weakness, dizziness, fainting, or chills. Any of these symptoms may represent a serious problem that is an emergency. Do not wait to see if the symptoms will go away. Call your local emergency services (911 in the [place] Do not drive yourself to the hospital. MAKE SURE YOU:  Understand these instructions.  Will watch your condition.  Will get help right away if you are not doing well or get worse. Document Released: 11/16/2010 Document Revised: 10/19/2013 Document Reviewed: 04/15/2013 [facility] Patient Information 2015 [place], [place]. This information is not intended to replace advice given to you by your health care provider. Make sure you discuss any questions you have with your health  care provider.

## 2014-05-14 NOTE — ED Provider Notes (Addendum)
CSN: [id number]     Arrival date & time 05/13/14  2214 History   First MD Initiated Contact with Patient 05/13/14 2304     Chief Complaint  Patient presents with  . Chest Pain     (Consider location/radiation/quality/duration/timing/severity/associated sxs/prior Treatment) HPI 80 year old male presents to emergency room with complaint of chest pain.  Patient reports he has had chest pain throughout the day.  Chest pain started this morning after breakfast when he tried to be.  He reports the pain as central nonradiating.  Pain worsened tonight with dinner after having mashed potatoes.  He should reports pain has since resolved.  Patient initially hypotensive and tachycardic with A. fib with RVR upon arrival.  He has history of similar in past.  Past patient is status post an NSTEMI last month.  At that time he had a  heart catheterization showing diffuse disease, and plan was for medical management at that time.  Patient is currently undergoing treatment for non-Hodgkin's lymphoma with daily radiation and chemotherapy once a week.  No fevers or chills.  No nausea or vomiting.  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 Past Medical History  Diagnosis Date  . Peripheral arterial disease     s/p Ao Bifem Bypass  . Diabetes mellitus without complication   . CAD (coronary artery disease)     By  CT Scan  . Hyperlipidemia   . Hypertension   . Non Hodgkin's lymphoma 2000, 2010  . Peripheral neuropathy   . COPD (chronic obstructive pulmonary disease)   . History of lower GI bleeding   . Substance abuse   . Lung cancer, upper lobe     left lung  . AAA (abdominal aortic aneurysm)   . MI (myocardial infarction) 04/10/14   Past Surgical History  Procedure Laterality Date  . Fracture surgery  2009    Left ankle  . Pr vein bypass graft,aorto-fem-pop  2005    Right common femoral to BK popliteal BPG  .  Pr vein bypass graft,aorto-fem-pop  01-27-05    Revision of Right fem-pop BPG  . Cardiac catheterization  1990's  . Video bronchoscopy Bilateral 03/14/2014    Procedure: VIDEO BRONCHOSCOPY WITH FLUORO;  Surgeon: Rigoberto Noel, MD;  Location: [facility];  Service: Cardiopulmonary;  Laterality: Bilateral;  . Nm myoview ltd  11/2013    Lexiscan.  EF 60%, small fixed inferoapical defect - ? artifact  . Transthoracic echocardiogram  11/2013    EF 60-65%, Gr 1 DD, Mod AS, Sever MAC - no MS.      Family History  Problem Relation Age of Onset  . Cancer Mother     Colon  . Heart attack Mother   . Stroke Father   . Hyperlipidemia Father   . Hyperlipidemia Sister   . Hyperlipidemia Brother   . Hyperlipidemia Daughter   . Hypertension Son    History  Substance Use Topics  . Smoking status: Former Smoker -- 1.00 packs/day for 50 years    Types: Cigarettes    Quit date: 10/29/2003  . Smokeless tobacco: Never Used  . Alcohol Use: No    Review of Systems  See History of Present Illness; otherwise all other systems are reviewed and negative   Allergies  Review of patient's allergies indicates no known allergies.  Home Medications   Prior to Admission medications   Medication Sig Start Date End Date Taking?  Authorizing Provider  Acetaminophen (TYLENOL ARTHRITIS PAIN PO) Take 650 mg by mouth daily as needed (pain).    Yes Historical Provider, MD  aspirin 81 MG tablet Take 81 mg by mouth 2 (two) times daily.    Yes Historical Provider, MD  diltiazem (CARDIZEM CD) 180 MG 24 hr capsule Take 180 mg by mouth daily.   Yes Historical Provider, MD  DULoxetine (CYMBALTA) 30 MG capsule Take 30 mg by mouth daily. 04/27/13  Yes Historical Provider, MD  fenofibrate micronized (LOFIBRA) 200 MG capsule Take 200 mg by mouth daily before breakfast.   Yes Historical Provider, MD  finasteride (PROSCAR) 5 MG tablet Take 5 mg by mouth daily.   Yes Historical Provider, MD  gabapentin (NEURONTIN) 600 MG tablet Take  600 mg by mouth 3 (three) times daily.    Yes Historical Provider, MD  Glucosamine-Chondroit-Vit C-Mn (GLUCOSAMINE CHONDR 1500 COMPLX) CAPS Take 1 capsule by mouth daily.   Yes Historical Provider, MD  hyaluronate sodium (RADIAPLEXRX) GEL Apply 1 application topically 2 (two) times daily.   Yes Historical Provider, MD  insulin aspart (NOVOLOG) 100 UNIT/ML injection Inject 5 Units into the skin 3 (three) times daily with meals. 04/26/14  Yes Tiffany L Reed, DO  insulin glargine (LANTUS) 100 UNIT/ML injection Inject 0.22 mLs (22 Units total) into the skin at bedtime. 04/16/14  Yes Costin Karlyne Greenspan, MD  lidocaine-prilocaine (EMLA) cream Apply 1 application topically as needed. Apply 1-2 hours prior to stick and cover with plastic wrap 04/08/14  Yes Ladell Pier, MD  lisinopril (PRINIVIL,ZESTRIL) 20 MG tablet Take 20 mg by mouth 2 (two) times daily.   Yes Historical Provider, MD  metoprolol tartrate (LOPRESSOR) 25 MG tablet Take 1 tablet (25 mg total) by mouth 2 (two) times daily. 04/16/14  Yes Costin Karlyne Greenspan, MD  oxyCODONE-acetaminophen (PERCOCET/ROXICET) 5-325 MG per tablet Take 1 tablet by mouth every 4 (four) hours as needed for severe pain. 04/16/14  Yes Costin Karlyne Greenspan, MD  pioglitazone (ACTOS) 30 MG tablet Take 30 mg by mouth daily.   Yes Historical Provider, MD  prochlorperazine (COMPAZINE) 5 MG tablet Take 1 tablet (5 mg total) by mouth every 6 (six) hours as needed for nausea or vomiting. 04/08/14  Yes Ladell Pier, MD  rosuvastatin (CRESTOR) 10 MG tablet Take 10 mg by mouth at bedtime.    Yes Historical Provider, MD  Tamsulosin HCl (FLOMAX) 0.4 MG CAPS Take 0.4 mg by mouth daily.   Yes Historical Provider, MD   BP 91/54  Pulse 92  Temp(Src) 98 F (36.7 C) (Oral)  Resp 20  Ht 5\' 11"  (1.803 m)  Wt 200 lb (90.719 kg)  BMI 27.91 kg/m2  SpO2 100% Physical Exam  Nursing note and vitals reviewed. Constitutional: He is oriented to person, place, and time. He appears well-developed and  well-nourished. No distress.  HENT:  Head: Normocephalic and atraumatic.  Right Ear: External ear normal.  Left Ear: External ear normal.  Nose: Nose normal.  Mouth/Throat: Oropharynx is clear and moist.  Eyes: Conjunctivae and EOM are normal. Pupils are equal, round, and reactive to light.  Neck: Normal range of motion. Neck supple. No JVD present. No tracheal deviation present. No thyromegaly present.  Cardiovascular: Normal rate, regular rhythm, normal heart sounds and intact distal pulses.  Exam reveals no gallop and no friction rub.   No murmur heard. Pulmonary/Chest: Effort normal and breath sounds normal. No stridor. No respiratory distress. He has no wheezes. He has no rales. He exhibits  no tenderness.  Abdominal: Soft. Bowel sounds are normal. He exhibits no distension and no mass. There is no tenderness. There is no rebound and no guarding.  Musculoskeletal: Normal range of motion. He exhibits no edema and no tenderness.  Lymphadenopathy:    He has no cervical adenopathy.  Neurological: He is alert and oriented to person, place, and time. He exhibits normal muscle tone. Coordination normal.  Skin: Skin is warm and dry. No rash noted. No erythema. No pallor.  Psychiatric: He has a normal mood and affect. His behavior is normal. Judgment and thought content normal.    ED Course  Procedures (including critical care time) Labs Review Labs Reviewed  CBC - Abnormal; Notable for the following:    WBC 2.1 (*)    RBC 3.49 (*)    Hemoglobin 9.8 (*)    HCT 30.2 (*)    RDW 16.0 (*)    Platelets 112 (*)    All other components within normal limits  BASIC METABOLIC PANEL - Abnormal; Notable for the following:    Sodium 136 (*)    Potassium 3.5 (*)    Chloride 95 (*)    Glucose, Bld 223 (*)    Calcium 8.2 (*)    GFR calc non Af Amer 87 (*)    Anion gap 16 (*)    All other components within normal limits  PRO B NATRIURETIC PEPTIDE - Abnormal; Notable for the following:    Pro B  Natriuretic peptide (BNP) 1118.0 (*)    All other components within normal limits  PROTIME-INR  I-STAT TROPOININ, ED  Randolm Idol, ED    Imaging Review Dg Chest Port 1 View  05/13/2014   CLINICAL DATA:  Substernal chest pain.  EXAM: PORTABLE CHEST - 1 VIEW  COMPARISON:  Chest radiograph performed 04/10/2014  FINDINGS: The lungs are well-aerated. The patient's left upper lobe perihilar mass is similar in appearance to prior studies. There is likely persistent underlying opacification of the left upper lobe, difficult to fully characterize on the provided image. Superimposed mild left basilar pneumonia cannot be excluded. There is no evidence of pleural effusion or pneumothorax.  The cardiomediastinal silhouette is within normal limits. No acute osseous abnormalities are seen. A right-sided chest port is noted ending about the distal SVC.  IMPRESSION: Known left upper lobe perihilar mass is similar in appearance to prior studies, though difficult to fully assess. Likely persistent underlying opacification of the left upper lobe. Cannot exclude superimposed mild left basilar pneumonia; would correlate for associated symptoms.   Electronically Signed   By: Garald Balding M.D.   On: 05/13/2014 22:56     EKG Interpretation   Date/Time:  Friday May 13 2014 22:20:15 EDT Ventricular Rate:  133 PR Interval:    QRS Duration: 123 QT Interval:  357 QTC Calculation: 531 R Axis:   -91 Text Interpretation:  Atrial fibrillation Nonspecific IVCD with LAD LVH  with secondary repolarization abnormality Anterior Q waves, possibly due  to LVH afib with rvr No significant change since last tracing Confirmed by  Anatole Apollo  MD, Avondre Richens ([id number]) on 05/13/2014 11:11:00 PM        Date: 05/15/2014  Rate: 83  Rhythm: normal sinus rhythm  QRS Axis: normal  Intervals: normal  ST/T Wave abnormalities: normal  Conduction Disutrbances:nonspecific intraventricular conduction delay  Narrative Interpretation:   Old EKG  Reviewed: changes noted   MDM   Final diagnoses:  Atypical chest pain  Esophageal pain  Paroxysmal a-fib    80 year old  male with chest pain, A. fib with RVR hypotension now all resolved.  The pain came on related to eating today, patient has been instructed that he will have some esophagitis issues due to his radiation.  Patient has history of paroxysmal A. fib, not a anticoagulant addition a candidate.  We'll touch base with cardiology for their input.    2:44 AM D/w Dr Lou Cal regarding pt's afib.  He recommends f/u with cardiology in clinic, medication reconciliation, possible holter monitor.   Kalman Drape, MD 05/14/14 [place], MD 05/14/14 [phone]

## 2014-05-14 NOTE — ED Notes (Signed)
Pt pick up by ptar

## 2014-05-16 ENCOUNTER — Ambulatory Visit: Payer: Medicare Other

## 2014-05-16 ENCOUNTER — Other Ambulatory Visit (HOSPITAL_BASED_OUTPATIENT_CLINIC_OR_DEPARTMENT_OTHER): Payer: Medicare Other

## 2014-05-16 ENCOUNTER — Telehealth: Payer: Self-pay | Admitting: *Deleted

## 2014-05-16 ENCOUNTER — Ambulatory Visit
Admission: RE | Admit: 2014-05-16 | Discharge: 2014-05-16 | Disposition: A | Discharge status: Home / Self Care | Payer: Medicare Other | Source: Ambulatory Visit | Attending: Radiation Oncology | Admitting: Radiation Oncology

## 2014-05-16 ENCOUNTER — Telehealth: Payer: Self-pay | Admitting: Oncology

## 2014-05-16 ENCOUNTER — Ambulatory Visit (HOSPITAL_BASED_OUTPATIENT_CLINIC_OR_DEPARTMENT_OTHER): Payer: Medicare Other

## 2014-05-16 ENCOUNTER — Ambulatory Visit (HOSPITAL_BASED_OUTPATIENT_CLINIC_OR_DEPARTMENT_OTHER): Payer: Medicare Other | Admitting: Nurse Practitioner

## 2014-05-16 VITALS — BP 101/51 | HR 92 | Resp 17 | Ht 71.0 in | Wt 192.2 lb

## 2014-05-16 DIAGNOSIS — I4891 Unspecified atrial fibrillation: Secondary | ICD-10-CM

## 2014-05-16 DIAGNOSIS — N189 Chronic kidney disease, unspecified: Secondary | ICD-10-CM

## 2014-05-16 DIAGNOSIS — C3410 Malignant neoplasm of upper lobe, unspecified bronchus or lung: Secondary | ICD-10-CM

## 2014-05-16 DIAGNOSIS — J449 Chronic obstructive pulmonary disease, unspecified: Secondary | ICD-10-CM

## 2014-05-16 DIAGNOSIS — Z510 Encounter for antineoplastic radiation therapy: Secondary | ICD-10-CM | POA: Diagnosis not present

## 2014-05-16 DIAGNOSIS — B370 Candidal stomatitis: Secondary | ICD-10-CM

## 2014-05-16 LAB — COMPREHENSIVE METABOLIC PANEL (CC13)
ALT: 21 U/L (ref 0–55)
AST: 26 U/L (ref 5–34)
Albumin: 3.1 g/dL — ABNORMAL LOW (ref 3.5–5.0)
Alkaline Phosphatase: 49 U/L (ref 40–150)
Anion Gap: 13 mEq/L — ABNORMAL HIGH (ref 3–11)
BILIRUBIN TOTAL: 0.94 mg/dL (ref 0.20–1.20)
BUN: 7.9 mg/dL (ref 7.0–26.0)
CALCIUM: 9.8 mg/dL (ref 8.4–10.4)
CO2: 24 meq/L (ref 22–29)
CREATININE: 0.9 mg/dL (ref 0.7–1.3)
Chloride: 100 mEq/L (ref 98–109)
Glucose: 224 mg/dl — ABNORMAL HIGH (ref 70–140)
Potassium: 3.3 mEq/L — ABNORMAL LOW (ref 3.5–5.1)
Sodium: 137 mEq/L (ref 136–145)
Total Protein: 6.7 g/dL (ref 6.4–8.3)

## 2014-05-16 LAB — CBC WITH DIFFERENTIAL/PLATELET
BASO%: 0.6 % (ref 0.0–2.0)
BASOS ABS: 0 10*3/uL (ref 0.0–0.1)
EOS%: 0.3 % (ref 0.0–7.0)
Eosinophils Absolute: 0 10*3/uL (ref 0.0–0.5)
HEMATOCRIT: 37.1 % — AB (ref 38.4–49.9)
HEMOGLOBIN: 12.1 g/dL — AB (ref 13.0–17.1)
LYMPH%: 21.6 % (ref 14.0–49.0)
MCH: 28.2 pg (ref 27.2–33.4)
MCHC: 32.6 g/dL (ref 32.0–36.0)
MCV: 86.5 fL (ref 79.3–98.0)
MONO#: 0.4 10*3/uL (ref 0.1–0.9)
MONO%: 12 % (ref 0.0–14.0)
NEUT#: 2.2 10*3/uL (ref 1.5–6.5)
NEUT%: 65.5 % (ref 39.0–75.0)
PLATELETS: 134 10*3/uL — AB (ref 140–400)
RBC: 4.29 10*6/uL (ref 4.20–5.82)
RDW: 15.8 % — ABNORMAL HIGH (ref 11.0–14.6)
WBC: 3.3 10*3/uL — ABNORMAL LOW (ref 4.0–10.3)
lymph#: 0.7 10*3/uL — ABNORMAL LOW (ref 0.9–3.3)

## 2014-05-16 MED ORDER — FLUCONAZOLE 100 MG PO TABS
100.0000 mg | ORAL_TABLET | Freq: Every day | ORAL | Status: DC
Start: 2014-05-16 — End: 2014-05-19

## 2014-05-16 MED ORDER — SODIUM CHLORIDE 0.9 % IV SOLN
Freq: Once | INTRAVENOUS | Status: AC
  Administered 2014-05-16: 11:00:00 via INTRAVENOUS
  Filled 2014-05-16: qty 1000

## 2014-05-16 MED ORDER — HEPARIN SOD (PORK) LOCK FLUSH 100 UNIT/ML IV SOLN
500.0000 [IU] | Freq: Once | INTRAVENOUS | Status: AC
  Administered 2014-05-16: 500 [IU] via INTRAVENOUS
  Filled 2014-05-16: qty 5

## 2014-05-16 MED ORDER — SODIUM CHLORIDE 0.9 % IJ SOLN
10.0000 mL | INTRAMUSCULAR | Status: DC | PRN
  Administered 2014-05-16: 10 mL via INTRAVENOUS
  Filled 2014-05-16: qty 10

## 2014-05-16 NOTE — Telephone Encounter (Signed)
Informed patient's granddaughter to hold patient's  Lisinopril.  Per [name] [name]. [name].  Patient's Granddaughter verbalized understanding.

## 2014-05-16 NOTE — Progress Notes (Addendum)
  [facility] OFFICE PROGRESS NOTE   Diagnosis:  Non-small cell lung cancer.  INTERVAL HISTORY:   [name] returns as scheduled. He continues radiation and weekly Taxol/carboplatin. He completed week 4 of the chemotherapy on 05/09/2014. He was seen in the emergency Department on 05/13/2014 for evaluation of chest pain felt to be due to esophagitis. He was started on Carafate. He was found to be in rapid atrial fibrillation. Followup was recommended with cardiology.  He denies further chest pain. He is tolerating soft foods without difficulty. He has periodic dry heaves. No significant nausea/vomiting. He denies any mouth sores. He estimates 3-4 loose stools a day. No further hemoptysis. He continues oxygen at 2 L per minute. He feels more short of breath.    Objective:  Vital signs in last 24 hours:  Blood pressure 101/51, pulse 92, temperature 0 F (-17.8 C), temperature source Oral, resp. rate 17, height 5\' 11"  (1.803 m), weight 192 lb 3.2 oz (87.181 kg), SpO2 97.00%.  Repeat heart rate 120.  HEENT: White coating over tongue. Resp: Lungs clear. Cardio: Irregularly irregular. GI: Abdomen soft and nontender. No hepatomegaly. Vascular: No leg edema.  Skin: Decreased skin turgor.    Lab Results:  Lab Results  Component Value Date   WBC 3.3* 05/16/2014   HGB 12.1* 05/16/2014   HCT 37.1* 05/16/2014   MCV 86.5 05/16/2014   PLT 134* 05/16/2014   NEUTROABS 2.2 05/16/2014    Imaging:  No results found.  Medications: I have reviewed the patient's current medications.  Assessment/Plan: 1. 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  Initiation of chest radiation 04/05/2014.  Initiation of weekly Taxol/carboplatin chemotherapy 04/18/2014. 2. Admission 04/10/2014 with acute CHF and acute coronary  syndrome, status post a cardiac catheterization 04/10/2014, clinical improvement with medical therapy.  3. COPD  4. Chronic renal failure  5. Diabetes  6. Neuropathy  7. History of non-Hodgkin's lymphoma  8. Atrial fibrillation with RVR. 9. status post evaluation in the emergency department 05/13/2014 for chest pain. Question radiation esophagitis. 10. Oral candidiasis. He will complete a 5 day course of Diflucan.   Disposition: [name] was recently seen in the emergency department with chest pain. Question radiation esophagitis. The pain is better. He continues Carafate.  He is likely dehydrated. He is in rapid atrial fibrillation and blood pressure is decreased. He was instructed to hold lisinopril. We are holding chemotherapy today. He will receive IV fluids with normal saline with 20 mEq of potassium per liter over 4 hours today and tomorrow. We will see him in followup on 05/19/2014.  Patient seen with Dr. Benay Spice. 25 minutes were spent face-to-face at today's visit with the majority of the time involved in counseling/coordination of care.  Ned Card ANP/GNP-BC   05/16/2014  10:14 AM  This was a shared visit with Ned Card. [name] was interviewed and examined.  He presents with failure to thrive. He is most likely dehydrated. He will receive intravenous fluids today and again on 05/17/2014. He will return for an office visit 05/19/2014. Chemotherapy will be held this week.  Julieanne Manson, M.D.

## 2014-05-16 NOTE — Patient Instructions (Signed)
Dehydration, Adult Dehydration is when you lose more fluids from the body than you take in. Vital organs like the kidneys, brain, and heart cannot function without a proper amount of fluids and salt. Any loss of fluids from the body can cause dehydration.  CAUSES   Vomiting.  Diarrhea.  Excessive sweating.  Excessive urine output.  Fever. SYMPTOMS  Mild dehydration  Thirst.  Dry lips.  Slightly dry mouth. Moderate dehydration  Very dry mouth.  Sunken eyes.  Skin does not bounce back quickly when lightly pinched and released.  Dark urine and decreased urine production.  Decreased tear production.  Headache. Severe dehydration  Very dry mouth.  Extreme thirst.  Rapid, weak pulse (more than 100 beats per minute at rest).  Cold hands and feet.  Not able to sweat in spite of heat and temperature.  Rapid breathing.  Blue lips.  Confusion and lethargy.  Difficulty being awakened.  Minimal urine production.  No tears. DIAGNOSIS  Your caregiver will diagnose dehydration based on your symptoms and your exam. Blood and urine tests will help confirm the diagnosis. The diagnostic evaluation should also identify the cause of dehydration. TREATMENT  Treatment of mild or moderate dehydration can often be done at home by increasing the amount of fluids that you drink. It is best to drink small amounts of fluid more often. Drinking too much at one time can make vomiting worse. Refer to the home care instructions below. Severe dehydration needs to be treated at the hospital where you will probably be given intravenous (IV) fluids that contain water and electrolytes. HOME CARE INSTRUCTIONS   Ask your caregiver about specific rehydration instructions.  Drink enough fluids to keep your urine clear or pale yellow.  Drink small amounts frequently if you have nausea and vomiting.  Eat as you normally do.  Avoid:  Foods or drinks high in sugar.  Carbonated  drinks.  Juice.  Extremely hot or cold fluids.  Drinks with caffeine.  Fatty, greasy foods.  Alcohol.  Tobacco.  Overeating.  Gelatin desserts.  Wash your hands well to avoid spreading bacteria and viruses.  Only take over-the-counter or prescription medicines for pain, discomfort, or fever as directed by your caregiver.  Ask your caregiver if you should continue all prescribed and over-the-counter medicines.  Keep all follow-up appointments with your caregiver. SEEK MEDICAL CARE IF:  You have abdominal pain and it increases or stays in one area (localizes).  You have a rash, stiff neck, or severe headache.  You are irritable, sleepy, or difficult to awaken.  You are weak, dizzy, or extremely thirsty. SEEK IMMEDIATE MEDICAL CARE IF:   You are unable to keep fluids down or you get worse despite treatment.  You have frequent episodes of vomiting or diarrhea.  You have blood or green matter (bile) in your vomit.  You have blood in your stool or your stool looks black and tarry.  You have not urinated in 6 to 8 hours, or you have only urinated a small amount of very dark urine.  You have a fever.  You faint. MAKE SURE YOU:   Understand these instructions.  Will watch your condition.  Will get help right away if you are not doing well or get worse. Document Released: 10/14/2005 Document Revised: 01/06/2012 Document Reviewed: 06/03/2011 [employer] Patient Information 2015 [employer]. This information is not intended to replace advice given to you by your health care provider. Make sure you discuss any questions you have with your health care   provider.  

## 2014-05-16 NOTE — Telephone Encounter (Signed)
gv adn printed appt scehd adna vs for pt for July...Marland Kitchenper Dr. Fransisco Hertz appt on 7.23

## 2014-05-16 NOTE — Telephone Encounter (Signed)
Error

## 2014-05-17 ENCOUNTER — Other Ambulatory Visit: Payer: Self-pay | Admitting: Nurse Practitioner

## 2014-05-17 ENCOUNTER — Ambulatory Visit
Admission: RE | Admit: 2014-05-17 | Discharge: 2014-05-17 | Disposition: A | Discharge status: Home / Self Care | Payer: Medicare Other | Source: Ambulatory Visit | Attending: Radiation Oncology | Admitting: Radiation Oncology

## 2014-05-17 ENCOUNTER — Ambulatory Visit (HOSPITAL_BASED_OUTPATIENT_CLINIC_OR_DEPARTMENT_OTHER): Payer: Medicare Other

## 2014-05-17 ENCOUNTER — Encounter: Payer: Self-pay | Admitting: Radiation Oncology

## 2014-05-17 VITALS — BP 117/70 | HR 85 | Temp 97.3°F | Ht 71.0 in | Wt 195.5 lb

## 2014-05-17 VITALS — BP 105/59 | HR 94 | Temp 98.0°F | Resp 18

## 2014-05-17 DIAGNOSIS — Z510 Encounter for antineoplastic radiation therapy: Secondary | ICD-10-CM | POA: Diagnosis not present

## 2014-05-17 DIAGNOSIS — C3410 Malignant neoplasm of upper lobe, unspecified bronchus or lung: Secondary | ICD-10-CM

## 2014-05-17 DIAGNOSIS — I4891 Unspecified atrial fibrillation: Secondary | ICD-10-CM

## 2014-05-17 DIAGNOSIS — C3412 Malignant neoplasm of upper lobe, left bronchus or lung: Secondary | ICD-10-CM

## 2014-05-17 MED ORDER — SODIUM CHLORIDE 0.9 % IV SOLN
Freq: Once | INTRAVENOUS | Status: DC
  Administered 2014-05-17: 12:00:00 via INTRAVENOUS
  Filled 2014-05-17: qty 1000

## 2014-05-17 MED ORDER — HEPARIN SOD (PORK) LOCK FLUSH 100 UNIT/ML IV SOLN
500.0000 [IU] | Freq: Once | INTRAVENOUS | Status: AC
  Administered 2014-05-17: 500 [IU] via INTRAVENOUS
  Filled 2014-05-17: qty 5

## 2014-05-17 MED ORDER — SODIUM CHLORIDE 0.9 % IJ SOLN
10.0000 mL | INTRAMUSCULAR | Status: DC | PRN
  Administered 2014-05-17 (×2): 10 mL via INTRAVENOUS
  Filled 2014-05-17: qty 10

## 2014-05-17 NOTE — Progress Notes (Signed)
  Radiation Oncology         [phone] ________________________________  Name: [name] [name] MRN: [id number]  Date: 05/17/2014  DOB: 09-Jun-1933  Weekly Radiation Therapy Management  Lung cancer, upper lobe   Primary site: Lung   Staging method: AJCC 7th Edition   Clinical: Stage Unknown    Summary: Stage Unknown (TX, N1, M0)   Current Dose: 46.8 Gy     Planned Dose:  63 Gy  Narrative . . . . . . . . The patient presents for routine under treatment assessment.                                   The patient is without complaint. He is feeling much better after receiving IV fluids.  Chemotherapy was canceled yesterday.                                 Set-up films were reviewed.                                 The chart was checked. Physical Findings. . .  height is 5\' 11"  (1.803 m) and weight is 195 lb 8 oz (88.678 kg). His oral temperature is 97.3 F (36.3 C). His blood pressure is 117/70 and his pulse is 85. His oxygen saturation is 100%. . The lungs are clear. The heart has a regular rhythm and rate at this time.   Impression . . . . . . . The patient is tolerating radiation. Plan . . . . . . . . . . . . Continue treatment as planned.  ________________________________   Blair Promise, PhD, MD

## 2014-05-17 NOTE — Progress Notes (Signed)
Patient states he has had two episodes of diarrhea this morning. Patient states he hasn't taken anything to help relieve the symptoms. Patient instructed to take imodium if diarrhea continues, and if he doesn't get relief after taking imodium he is to call our office. Patient verbalized understanding to the above plan.

## 2014-05-17 NOTE — Patient Instructions (Signed)

## 2014-05-17 NOTE — Progress Notes (Signed)
[name] [name] has completed 28 fractions to his left chest.  He denies pain.  He reports that food is getting stuck in his mid chest when he eats.  He is taking Carafate.  He takes an occasional percocet.  He is getting IV fluids - NS with 20 Meq of potassium at 250 ml/hr - through his right chest port.  He reports he felt bad yesterday and his chemotherapy was canceled.  He reports an occasional cough.  He denies hemoptysis and reports having nose bleeds from his oxygen.  He is using 2l of oxygen through a nasal canula.  He went to the ER on Friday and had AFIB.  His skin is pink on his left chest.  He is using radiaplex gel.

## 2014-05-18 ENCOUNTER — Ambulatory Visit
Admission: RE | Admit: 2014-05-18 | Discharge: 2014-05-18 | Disposition: A | Discharge status: Home / Self Care | Payer: Medicare Other | Source: Ambulatory Visit | Attending: Radiation Oncology | Admitting: Radiation Oncology

## 2014-05-18 DIAGNOSIS — Z510 Encounter for antineoplastic radiation therapy: Secondary | ICD-10-CM | POA: Diagnosis not present

## 2014-05-19 ENCOUNTER — Ambulatory Visit
Admission: RE | Admit: 2014-05-19 | Discharge: 2014-05-19 | Disposition: A | Discharge status: Home / Self Care | Payer: Medicare Other | Source: Ambulatory Visit | Attending: Radiation Oncology | Admitting: Radiation Oncology

## 2014-05-19 ENCOUNTER — Encounter: Payer: Self-pay | Admitting: Nurse Practitioner

## 2014-05-19 ENCOUNTER — Ambulatory Visit (HOSPITAL_BASED_OUTPATIENT_CLINIC_OR_DEPARTMENT_OTHER): Payer: Medicare Other | Admitting: Nurse Practitioner

## 2014-05-19 VITALS — BP 110/68 | HR 98 | Temp 98.5°F | Resp 18 | Ht 71.0 in | Wt 195.0 lb

## 2014-05-19 DIAGNOSIS — C3410 Malignant neoplasm of upper lobe, unspecified bronchus or lung: Secondary | ICD-10-CM

## 2014-05-19 DIAGNOSIS — B370 Candidal stomatitis: Secondary | ICD-10-CM | POA: Insufficient documentation

## 2014-05-19 DIAGNOSIS — C3412 Malignant neoplasm of upper lobe, left bronchus or lung: Secondary | ICD-10-CM

## 2014-05-19 DIAGNOSIS — Z510 Encounter for antineoplastic radiation therapy: Secondary | ICD-10-CM | POA: Diagnosis not present

## 2014-05-19 MED ORDER — FLUCONAZOLE 100 MG PO TABS: 100.0000 mg | ORAL_TABLET | Freq: Every day | ORAL | Status: DC

## 2014-05-19 NOTE — Assessment & Plan Note (Signed)
Patient was also diagnosed with oral and he is this week as well.  He was prescribed a five-day course of oral Diflucan;  but both patient and his family state that he has not picked up his medication from the pharmacy yet.  He continues to take Carafate his previously directed for probable issues with mild radiation esophagitis as well.  Patient continues with some very mild Candidus is to his posterior tongue.  Didn't re-prescribe oral Diflucan for the patient to take to a new pharmacy which is most closer for the patient and much more convenient.  Patient continues to maintain that he is not dehydrated-and refuses any IV fluid rehydration today.  Advised patient to let us know if he feels dehydrated; we will be glad to give him some IV fluids prior to the weekend.

## 2014-05-19 NOTE — Progress Notes (Signed)
[facility]   Chief Complaint  Patient presents with  . Follow-up    HPI: [name] [name] 80 y.o. male diagnosed with lung cancer; and is currently undergoing both radiation therapy and Taxol/carboplatin chemotherapy regimen.  His chemotherapy was held this past Monday, 05/16/2014 due to issues with both atrial fib and dehydration.  Patient does have a cardiology followup planned.  Patient was also diagnosed with thrush; and prescribed Diflucan orally.  Patient states he has not picked up his prescription from his pharmacy yet; stating that his pharmacy is located a good distance from his home.  He is here today for followup.  Patient states that his thrush issues are slowly resolving; and that he does not feel dehydrated today.  He continues to deny any chest pain/chest pressure/shortness of breath.  He does remain on 2 L via nasal cannula O2 on a 24/7 basis.  He denies any recent fever or chills.  CURRENT THERAPY: Upcoming Treatment Dates - LUNG Carboplatin / Paclitaxel + XRT q7d Days with orders from any treatment category:  No upcoming days in selected categories.   Past Medical History  Diagnosis Date  . Peripheral arterial disease     s/p Ao Bifem Bypass  . Diabetes mellitus without complication   . CAD (coronary artery disease)     By  CT Scan  . Hyperlipidemia   . Hypertension   . Non Hodgkin's lymphoma 2000, 2010  . Peripheral neuropathy   . COPD (chronic obstructive pulmonary disease)   . History of lower GI bleeding   . Substance abuse   . Lung cancer, upper lobe     left lung  . AAA (abdominal aortic aneurysm)   . MI (myocardial infarction) 04/10/14    Past Surgical History  Procedure Laterality Date  . Fracture surgery  2009    Left ankle  . Pr vein bypass graft,aorto-fem-pop  2005    Right common femoral to BK popliteal BPG  . Pr vein bypass graft,aorto-fem-pop  01-27-05    Revision of Right fem-pop BPG  . Cardiac catheterization  1990's  .  Video bronchoscopy Bilateral 03/14/2014    Procedure: VIDEO BRONCHOSCOPY WITH FLUORO;  Surgeon: Rigoberto Noel, MD;  Location: [facility];  Service: Cardiopulmonary;  Laterality: Bilateral;  . Nm myoview ltd  11/2013    Lexiscan.  EF 60%, small fixed inferoapical defect - ? artifact  . Transthoracic echocardiogram  11/2013    EF 60-65%, Gr 1 DD, Mod AS, Sever MAC - no MS.       has DM (diabetes mellitus) type II controlled, neurological manifestation; HYPERLIPIDEMIA; NEUROPATHY; HYPERTENSION; CAD; HEMORRHOIDS; OTH&UNSPEC NONINFECTIOUS GASTROENTERITIS&COLITIS; DIVERTICULOSIS OF COLON; CHOLELITHIASIS; DIARRHEA; LYMPHOMA, HX OF; COLONIC POLYPS, HYPERPLASTIC, HX OF; PAD (peripheral artery disease); Aftercare following surgery of the circulatory system, NEC; Cardiac murmur, previously undiagnosed; AAA (abdominal aortic aneurysm) without rupture; Aortic stenosis; Syncope and collapse; COPD with exacerbation; Non Hodgkin's lymphoma; Lung mass; Syncope; Hypoglycemic reaction; Lung cancer, upper lobe; NSTEMI (non-ST elevated myocardial infarction); Squamous cell carcinoma lung; Depression; Malignant neoplasm of upper lobe of left lung; Diabetes mellitus type 2, uncontrolled, without complications; and Oral candidiasis on his problem list.     has No Known Allergies.    Medication List       This list is accurate as of: 05/19/14  5:07 PM.  Always use your most recent med list.               aspirin 81 MG tablet  Take  81 mg by mouth 2 (two) times daily.     diltiazem 180 MG 24 hr capsule  Commonly known as:  CARDIZEM CD  Take 180 mg by mouth daily.     DULoxetine 30 MG capsule  Commonly known as:  CYMBALTA  Take 30 mg by mouth daily.     fenofibrate micronized 200 MG capsule  Commonly known as:  LOFIBRA  Take 200 mg by mouth daily before breakfast.     finasteride 5 MG tablet  Commonly known as:  PROSCAR  Take 5 mg by mouth daily.     fluconazole 100 MG tablet  Commonly known as:   DIFLUCAN  Take 1 tablet (100 mg total) by mouth daily.     gabapentin 600 MG tablet  Commonly known as:  NEURONTIN  Take 600 mg by mouth 3 (three) times daily.     GLUCOSAMINE CHONDR 1500 COMPLX Caps  Take 1 capsule by mouth daily.     hyaluronate sodium Gel  Apply 1 application topically 2 (two) times daily.     insulin aspart 100 UNIT/ML injection  Commonly known as:  novoLOG  Inject 5 Units into the skin 3 (three) times daily with meals.     insulin glargine 100 UNIT/ML injection  Commonly known as:  LANTUS  Inject 0.22 mLs (22 Units total) into the skin at bedtime.     lidocaine-prilocaine cream  Commonly known as:  EMLA  Apply 1 application topically as needed. Apply 1-2 hours prior to stick and cover with plastic wrap     lisinopril 20 MG tablet  Commonly known as:  PRINIVIL,ZESTRIL  Take 20 mg by mouth 2 (two) times daily.     metoprolol tartrate 25 MG tablet  Commonly known as:  LOPRESSOR  Take 1 tablet (25 mg total) by mouth 2 (two) times daily.     oxyCODONE-acetaminophen 5-325 MG per tablet  Commonly known as:  PERCOCET/ROXICET  Take 1 tablet by mouth every 4 (four) hours as needed for severe pain.     pioglitazone 30 MG tablet  Commonly known as:  ACTOS  Take 30 mg by mouth daily.     prochlorperazine 5 MG tablet  Commonly known as:  COMPAZINE  Take 1 tablet (5 mg total) by mouth every 6 (six) hours as needed for nausea or vomiting.     rosuvastatin 10 MG tablet  Commonly known as:  CRESTOR  Take 10 mg by mouth at bedtime.     sucralfate 1 G tablet  Commonly known as:  CARAFATE  Take 1 tablet (1 g total) by mouth 4 (four) times daily.     tamsulosin 0.4 MG Caps capsule  Commonly known as:  FLOMAX  Take 0.4 mg by mouth daily.     TYLENOL ARTHRITIS PAIN PO  Take 650 mg by mouth daily as needed (pain).          PHYSICAL EXAMINATION  Blood pressure 110/68, pulse 98, temperature 98.5 F (36.9 C), temperature source Oral, resp. rate 18, height  5\' 11"  (1.803 m), weight 195 lb (88.451 kg).  GENERAL:alert, no distress, well nourished and well developed SKIN: skin color, texture, turgor are normal, no rashes or significant lesions HEAD: Normocephalic, No masses, lesions, tenderness or abnormalities EYES: PERRLA, Conjunctiva are pink and non-injected OROPHARYNX:no exudate, no erythema and mild white coating to posterior tongue only.  No oral lesions noted.  Patient appears to be managing all secretions very well.  NECK: supple, no adenopathy, no bruits, no JVD  LYMPH:  no palpable lymphadenopathy BREAST:not examined LUNGS: negative findings:  chest symmetric with normal A/P diameter, normal respiratory rate and rhythm, lungs clear to auscultation HEART: regular rate & rhythm, no murmurs and no gallops ABDOMEN:abdomen soft, non-tender, normal bowel sounds and no masses or organomegaly BACK: No CVA tenderness, Range of motion is normal EXTREMITIES:no edema, no clubbing, no cyanosis  NEURO: alert & oriented x 3 with fluent speech, gait normal  ASSESSMENT/PLAN:    Lung cancer, upper lobe  Assessment & Plan Patient is currently undergoing weekly Taxol/carboplatin chemotherapy and radiation therapy.  Patient's chemotherapy was held Monday, 05/16/2014 do to multiple issues which included rapid atrial fibrillation, hypertension, and dehydration.  The patient received IV fluid rehydration on 05/16/2014.  Does appear that he is feeling much better today on followup.  He has plans to return this coming Monday, 05/23/2014 to resume his chemotherapy as previously directed.   Oral candidiasis  Assessment & Plan Patient was also diagnosed with oral and he is this week as well.  He was prescribed a five-day course of oral Diflucan;  but both patient and his family state that he has not picked up his medication from the pharmacy yet.  He continues to take Carafate his previously directed for probable issues with mild radiation esophagitis as well.   Patient continues with some very mild Candidus is to his posterior tongue.  Didn't re-prescribe oral Diflucan for the patient to take to a new pharmacy which is most closer for the patient and much more convenient.  Patient continues to maintain that he is not dehydrated-and refuses any IV fluid rehydration today.  Advised patient to let us know if he feels dehydrated; we will be glad to give him some IV fluids prior to the weekend.     Patient stated understanding of all instructions; and was in agreement with this plan of care. The patient knows to call the clinic with any problems, questions or concerns.   Review/collaboration with Dr. Benay Spice regarding all aspects of patient's visit today.   Total time spent with patient was 25 minutes;  with greater than 75 percent of that time spent in face to face counseling regarding his symptoms, thrush treatment, and plan for chemotherapy next week, and coordination of care and follow up.  Disclaimer: This note was dictated with voice recognition software. Similar sounding words can inadvertently be transcribed and may not be corrected upon review.   Drue Second, NP 05/19/2014

## 2014-05-19 NOTE — Assessment & Plan Note (Addendum)
Patient is currently undergoing weekly Taxol/carboplatin chemotherapy and radiation therapy.  Patient's chemotherapy was held Monday, 05/16/2014 do to multiple issues which included rapid atrial fibrillation, hypertension, and dehydration.  The patient received IV fluid rehydration on 05/16/2014.  Does appear that he is feeling much better today on followup.  He has plans to return this coming Monday, 05/23/2014 to resume his chemotherapy as previously directed.

## 2014-05-20 ENCOUNTER — Ambulatory Visit
Admission: RE | Admit: 2014-05-20 | Discharge: 2014-05-20 | Disposition: A | Discharge status: Home / Self Care | Payer: Medicare Other | Source: Ambulatory Visit | Attending: Radiation Oncology | Admitting: Radiation Oncology

## 2014-05-20 ENCOUNTER — Ambulatory Visit (HOSPITAL_BASED_OUTPATIENT_CLINIC_OR_DEPARTMENT_OTHER): Payer: Medicare Other

## 2014-05-20 VITALS — BP 112/71 | HR 90 | Temp 97.0°F | Resp 18

## 2014-05-20 DIAGNOSIS — Z510 Encounter for antineoplastic radiation therapy: Secondary | ICD-10-CM | POA: Diagnosis not present

## 2014-05-20 DIAGNOSIS — C3412 Malignant neoplasm of upper lobe, left bronchus or lung: Secondary | ICD-10-CM

## 2014-05-20 DIAGNOSIS — E860 Dehydration: Secondary | ICD-10-CM

## 2014-05-20 DIAGNOSIS — C3410 Malignant neoplasm of upper lobe, unspecified bronchus or lung: Secondary | ICD-10-CM

## 2014-05-20 MED ORDER — HEPARIN SOD (PORK) LOCK FLUSH 100 UNIT/ML IV SOLN
500.0000 [IU] | Freq: Once | INTRAVENOUS | Status: AC
  Administered 2014-05-20: 500 [IU] via INTRAVENOUS
  Filled 2014-05-20: qty 5

## 2014-05-20 MED ORDER — SODIUM CHLORIDE 0.9 % IJ SOLN
10.0000 mL | INTRAMUSCULAR | Status: DC | PRN
  Administered 2014-05-20: 10 mL via INTRAVENOUS
  Filled 2014-05-20: qty 10

## 2014-05-20 MED ORDER — SODIUM CHLORIDE 0.9 % IV SOLN
Freq: Once | INTRAVENOUS | Status: AC
  Administered 2014-05-20: 12:00:00 via INTRAVENOUS

## 2014-05-20 MED ORDER — SODIUM CHLORIDE 0.9 % IV SOLN: Freq: Once | INTRAVENOUS | Status: AC

## 2014-05-20 NOTE — Patient Instructions (Signed)
Dehydration, Adult Dehydration is when you lose more fluids from the body than you take in. Vital organs like the kidneys, brain, and heart cannot function without a proper amount of fluids and salt. Any loss of fluids from the body can cause dehydration.  CAUSES   Vomiting.  Diarrhea.  Excessive sweating.  Excessive urine output.  Fever. SYMPTOMS  Mild dehydration  Thirst.  Dry lips.  Slightly dry mouth. Moderate dehydration  Very dry mouth.  Sunken eyes.  Skin does not bounce back quickly when lightly pinched and released.  Dark urine and decreased urine production.  Decreased tear production.  Headache. Severe dehydration  Very dry mouth.  Extreme thirst.  Rapid, weak pulse (more than 100 beats per minute at rest).  Cold hands and feet.  Not able to sweat in spite of heat and temperature.  Rapid breathing.  Blue lips.  Confusion and lethargy.  Difficulty being awakened.  Minimal urine production.  No tears. DIAGNOSIS  Your caregiver will diagnose dehydration based on your symptoms and your exam. Blood and urine tests will help confirm the diagnosis. The diagnostic evaluation should also identify the cause of dehydration. TREATMENT  Treatment of mild or moderate dehydration can often be done at home by increasing the amount of fluids that you drink. It is best to drink small amounts of fluid more often. Drinking too much at one time can make vomiting worse. Refer to the home care instructions below. Severe dehydration needs to be treated at the hospital where you will probably be given intravenous (IV) fluids that contain water and electrolytes. HOME CARE INSTRUCTIONS   Ask your caregiver about specific rehydration instructions.  Drink enough fluids to keep your urine clear or pale yellow.  Drink small amounts frequently if you have nausea and vomiting.  Eat as you normally do.  Avoid:  Foods or drinks high in sugar.  Carbonated  drinks.  Juice.  Extremely hot or cold fluids.  Drinks with caffeine.  Fatty, greasy foods.  Alcohol.  Tobacco.  Overeating.  Gelatin desserts.  Wash your hands well to avoid spreading bacteria and viruses.  Only take over-the-counter or prescription medicines for pain, discomfort, or fever as directed by your caregiver.  Ask your caregiver if you should continue all prescribed and over-the-counter medicines.  Keep all follow-up appointments with your caregiver. SEEK MEDICAL CARE IF:  You have abdominal pain and it increases or stays in one area (localizes).  You have a rash, stiff neck, or severe headache.  You are irritable, sleepy, or difficult to awaken.  You are weak, dizzy, or extremely thirsty. SEEK IMMEDIATE MEDICAL CARE IF:   You are unable to keep fluids down or you get worse despite treatment.  You have frequent episodes of vomiting or diarrhea.  You have blood or green matter (bile) in your vomit.  You have blood in your stool or your stool looks black and tarry.  You have not urinated in 6 to 8 hours, or you have only urinated a small amount of very dark urine.  You have a fever.  You faint. MAKE SURE YOU:   Understand these instructions.  Will watch your condition.  Will get help right away if you are not doing well or get worse. Document Released: 10/14/2005 Document Revised: 01/06/2012 Document Reviewed: 06/03/2011 [employer] Patient Information 2015 [employer]. This information is not intended to replace advice given to you by your health care provider. Make sure you discuss any questions you have with your health care   provider.  

## 2014-05-20 NOTE — Addendum Note (Signed)
Addended by: Drue Second R on: 05/20/2014 12:38 PM   Modules accepted: Orders

## 2014-05-23 ENCOUNTER — Other Ambulatory Visit (HOSPITAL_BASED_OUTPATIENT_CLINIC_OR_DEPARTMENT_OTHER): Payer: Medicare Other

## 2014-05-23 ENCOUNTER — Ambulatory Visit (HOSPITAL_BASED_OUTPATIENT_CLINIC_OR_DEPARTMENT_OTHER): Payer: Medicare Other

## 2014-05-23 ENCOUNTER — Other Ambulatory Visit: Payer: Self-pay | Admitting: Oncology

## 2014-05-23 ENCOUNTER — Ambulatory Visit (HOSPITAL_BASED_OUTPATIENT_CLINIC_OR_DEPARTMENT_OTHER): Payer: Medicare Other | Admitting: Nurse Practitioner

## 2014-05-23 ENCOUNTER — Ambulatory Visit
Admission: RE | Admit: 2014-05-23 | Discharge: 2014-05-23 | Disposition: A | Discharge status: Home / Self Care | Payer: Medicare Other | Source: Ambulatory Visit | Attending: Radiation Oncology | Admitting: Radiation Oncology

## 2014-05-23 ENCOUNTER — Ambulatory Visit: Payer: Medicare Other

## 2014-05-23 VITALS — BP 104/61 | HR 101 | Temp 97.6°F | Resp 17 | Ht 71.0 in | Wt 198.9 lb

## 2014-05-23 DIAGNOSIS — N189 Chronic kidney disease, unspecified: Secondary | ICD-10-CM

## 2014-05-23 DIAGNOSIS — Z87898 Personal history of other specified conditions: Secondary | ICD-10-CM

## 2014-05-23 DIAGNOSIS — C3410 Malignant neoplasm of upper lobe, unspecified bronchus or lung: Secondary | ICD-10-CM

## 2014-05-23 DIAGNOSIS — J449 Chronic obstructive pulmonary disease, unspecified: Secondary | ICD-10-CM

## 2014-05-23 DIAGNOSIS — Z5111 Encounter for antineoplastic chemotherapy: Secondary | ICD-10-CM

## 2014-05-23 DIAGNOSIS — C3412 Malignant neoplasm of upper lobe, left bronchus or lung: Secondary | ICD-10-CM

## 2014-05-23 DIAGNOSIS — G589 Mononeuropathy, unspecified: Secondary | ICD-10-CM

## 2014-05-23 DIAGNOSIS — E119 Type 2 diabetes mellitus without complications: Secondary | ICD-10-CM

## 2014-05-23 DIAGNOSIS — I4891 Unspecified atrial fibrillation: Secondary | ICD-10-CM

## 2014-05-23 DIAGNOSIS — Z510 Encounter for antineoplastic radiation therapy: Secondary | ICD-10-CM | POA: Diagnosis not present

## 2014-05-23 LAB — COMPREHENSIVE METABOLIC PANEL (CC13)
ALK PHOS: 58 U/L (ref 40–150)
ALT: 22 U/L (ref 0–55)
ANION GAP: 9 meq/L (ref 3–11)
AST: 21 U/L (ref 5–34)
Albumin: 2.8 g/dL — ABNORMAL LOW (ref 3.5–5.0)
BILIRUBIN TOTAL: 0.68 mg/dL (ref 0.20–1.20)
BUN: 4.8 mg/dL — AB (ref 7.0–26.0)
CO2: 25 meq/L (ref 22–29)
CREATININE: 0.8 mg/dL (ref 0.7–1.3)
Calcium: 9 mg/dL (ref 8.4–10.4)
Chloride: 106 mEq/L (ref 98–109)
GLUCOSE: 234 mg/dL — AB (ref 70–140)
Potassium: 3.4 mEq/L — ABNORMAL LOW (ref 3.5–5.1)
Sodium: 140 mEq/L (ref 136–145)
Total Protein: 6 g/dL — ABNORMAL LOW (ref 6.4–8.3)

## 2014-05-23 LAB — CBC WITH DIFFERENTIAL/PLATELET
BASO%: 0.8 % (ref 0.0–2.0)
BASOS ABS: 0 10*3/uL (ref 0.0–0.1)
EOS ABS: 0 10*3/uL (ref 0.0–0.5)
EOS%: 0.8 % (ref 0.0–7.0)
HEMATOCRIT: 31.9 % — AB (ref 38.4–49.9)
HEMOGLOBIN: 10.3 g/dL — AB (ref 13.0–17.1)
LYMPH#: 0.6 10*3/uL — AB (ref 0.9–3.3)
LYMPH%: 16.5 % (ref 14.0–49.0)
MCH: 28.6 pg (ref 27.2–33.4)
MCHC: 32.4 g/dL (ref 32.0–36.0)
MCV: 88.5 fL (ref 79.3–98.0)
MONO#: 0.7 10*3/uL (ref 0.1–0.9)
MONO%: 19.8 % — ABNORMAL HIGH (ref 0.0–14.0)
NEUT%: 62.1 % (ref 39.0–75.0)
NEUTROS ABS: 2.3 10*3/uL (ref 1.5–6.5)
Platelets: 125 10*3/uL — ABNORMAL LOW (ref 140–400)
RBC: 3.6 10*6/uL — ABNORMAL LOW (ref 4.20–5.82)
RDW: 18.5 % — AB (ref 11.0–14.6)
WBC: 3.8 10*3/uL — ABNORMAL LOW (ref 4.0–10.3)

## 2014-05-23 MED ORDER — DIPHENHYDRAMINE HCL 50 MG/ML IJ SOLN
25.0000 mg | Freq: Once | INTRAMUSCULAR | Status: AC
  Administered 2014-05-23: 25 mg via INTRAVENOUS

## 2014-05-23 MED ORDER — ONDANSETRON 16 MG/50ML IVPB (CHCC)
16.0000 mg | Freq: Once | INTRAVENOUS | Status: AC
  Administered 2014-05-23: 16 mg via INTRAVENOUS

## 2014-05-23 MED ORDER — HEPARIN SOD (PORK) LOCK FLUSH 100 UNIT/ML IV SOLN
500.0000 [IU] | Freq: Once | INTRAVENOUS | Status: AC | PRN
  Administered 2014-05-23: 500 [IU]
  Filled 2014-05-23: qty 5

## 2014-05-23 MED ORDER — DEXAMETHASONE SODIUM PHOSPHATE 10 MG/ML IJ SOLN
10.0000 mg | Freq: Once | INTRAMUSCULAR | Status: AC
  Administered 2014-05-23: 10 mg via INTRAVENOUS

## 2014-05-23 MED ORDER — FAMOTIDINE IN NACL 20-0.9 MG/50ML-% IV SOLN
INTRAVENOUS | Status: AC
  Filled 2014-05-23: qty 50

## 2014-05-23 MED ORDER — FAMOTIDINE IN NACL 20-0.9 MG/50ML-% IV SOLN
20.0000 mg | Freq: Once | INTRAVENOUS | Status: AC
  Administered 2014-05-23: 20 mg via INTRAVENOUS

## 2014-05-23 MED ORDER — PACLITAXEL CHEMO INJECTION 300 MG/50ML
45.0000 mg/m2 | Freq: Once | INTRAVENOUS | Status: AC
  Administered 2014-05-23: 102 mg via INTRAVENOUS
  Filled 2014-05-23: qty 17

## 2014-05-23 MED ORDER — DEXAMETHASONE SODIUM PHOSPHATE 10 MG/ML IJ SOLN
INTRAMUSCULAR | Status: AC
  Filled 2014-05-23: qty 1

## 2014-05-23 MED ORDER — SODIUM CHLORIDE 0.9 % IV SOLN
214.6000 mg | Freq: Once | INTRAVENOUS | Status: AC
  Administered 2014-05-23: 210 mg via INTRAVENOUS
  Filled 2014-05-23: qty 21

## 2014-05-23 MED ORDER — SODIUM CHLORIDE 0.9 % IJ SOLN
10.0000 mL | INTRAMUSCULAR | Status: DC | PRN
  Administered 2014-05-23: 10 mL
  Filled 2014-05-23: qty 10

## 2014-05-23 MED ORDER — SODIUM CHLORIDE 0.9 % IV SOLN
Freq: Once | INTRAVENOUS | Status: AC
  Administered 2014-05-23: 14:00:00 via INTRAVENOUS

## 2014-05-23 MED ORDER — DIPHENHYDRAMINE HCL 50 MG/ML IJ SOLN
INTRAMUSCULAR | Status: AC
  Filled 2014-05-23: qty 1

## 2014-05-23 MED ORDER — ONDANSETRON 16 MG/50ML IVPB (CHCC)
INTRAVENOUS | Status: AC
  Filled 2014-05-23: qty 16

## 2014-05-23 NOTE — Progress Notes (Signed)
  [facility] OFFICE PROGRESS NOTE   Diagnosis:  Non-small cell lung cancer.  INTERVAL HISTORY:   [name] returns as scheduled. He is feeling much better after receiving IV fluids last week. He reports a good appetite. He is having intermittent loose stools. He has periodic dyspnea. No hemoptysis. He denies dysphagia. He has periodic odynophagia. He has stable mild tingling in the toes which predated chemotherapy.  Objective:  Vital signs in last 24 hours:  Blood pressure 104/61, pulse 101, temperature 97.6 F (36.4 C), temperature source Oral, resp. rate 17, height 5\' 11"  (1.803 m), weight 198 lb 14.4 oz (90.22 kg), SpO2 99.00%, peak flow 2 L/min.    HEENT: No thrush or ulcerations. Resp: Few expiratory wheezes right upper lung field. No respiratory distress. Cardio: Slightly irregular. Rate within normal limits. GI: Abdomen soft and nontender. No hepatomegaly. Vascular: Trace lower leg edema bilaterally right slightly greater than left. Calves nontender.   Port-A-Cath site without erythema.    Lab Results:  Lab Results  Component Value Date   WBC 3.8* 05/23/2014   HGB 10.3* 05/23/2014   HCT 31.9* 05/23/2014   MCV 88.5 05/23/2014   PLT 125* 05/23/2014   NEUTROABS 2.3 05/23/2014    Imaging:  No results found.  Medications: I have reviewed the patient's current medications.  Assessment/Plan: 1. 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  Initiation of chest radiation 04/05/2014.  Initiation of weekly Taxol/carboplatin chemotherapy 04/18/2014. 2. Admission 04/10/2014 with acute CHF and acute coronary syndrome, status post a cardiac catheterization 04/10/2014, clinical improvement with medical therapy.  3. COPD  4. Chronic renal failure  5. Diabetes  6. Neuropathy  7. History of  non-Hodgkin's lymphoma  8. Atrial fibrillation.  9. Status post evaluation in the emergency department 05/13/2014 for chest pain. Question radiation esophagitis.  10. Oral candidiasis. He completed a 5 day course of Diflucan.    Disposition: [name] appears improved. He continues radiation. Plan to proceed with weekly Taxol/carboplatin today as scheduled. He will return for a followup visit in one week. He will contact the office in the interim with any problems.  Plan reviewed with Dr. Benay Spice.    Ned Card ANP/GNP-BC   05/23/2014  1:52 PM

## 2014-05-23 NOTE — Patient Instructions (Signed)
[facility] Discharge Instructions for Patients Receiving Chemotherapy  Today you received the following chemotherapy agents: taxol, carboplatin   To help prevent nausea and vomiting after your treatment, we encourage you to take your nausea medication as prescribed.    If you develop nausea and vomiting that is not controlled by your nausea medication, call the clinic.   BELOW ARE SYMPTOMS THAT SHOULD BE REPORTED IMMEDIATELY:  *FEVER GREATER THAN 100.5 F  *CHILLS WITH OR WITHOUT FEVER  NAUSEA AND VOMITING THAT IS NOT CONTROLLED WITH YOUR NAUSEA MEDICATION  *UNUSUAL SHORTNESS OF BREATH  *UNUSUAL BRUISING OR BLEEDING  TENDERNESS IN MOUTH AND THROAT WITH OR WITHOUT PRESENCE OF ULCERS  *URINARY PROBLEMS  *BOWEL PROBLEMS  UNUSUAL RASH Items with * indicate a potential emergency and should be followed up as soon as possible.  Feel free to call the clinic you have any questions or concerns. The clinic phone number is [phone].

## 2014-05-24 ENCOUNTER — Ambulatory Visit: Payer: Medicare Other

## 2014-05-24 ENCOUNTER — Ambulatory Visit
Admission: RE | Admit: 2014-05-24 | Discharge: 2014-05-24 | Disposition: A | Discharge status: Home / Self Care | Payer: Medicare Other | Source: Ambulatory Visit | Attending: Radiation Oncology | Admitting: Radiation Oncology

## 2014-05-24 ENCOUNTER — Other Ambulatory Visit: Payer: Self-pay | Admitting: *Deleted

## 2014-05-24 VITALS — BP 86/62 | HR 93 | Temp 97.5°F | Ht 71.0 in | Wt 204.6 lb

## 2014-05-24 DIAGNOSIS — Z510 Encounter for antineoplastic radiation therapy: Secondary | ICD-10-CM | POA: Diagnosis not present

## 2014-05-24 DIAGNOSIS — C3412 Malignant neoplasm of upper lobe, left bronchus or lung: Secondary | ICD-10-CM

## 2014-05-24 NOTE — Progress Notes (Signed)
[name] [name] has completed 31 fractions to his left chest.  He denies pain.  He denies a sore throat and pain with swallowing.  He reports that he is careful not to swallow air when he eats and has not had pain with eating.  He is taking Carafate.  He had chemotherapy yesterday.  He reports an occasional cough.  He denies hemoptysis.  He reports having nose bleeds in the morning.  He reports fatigue.  He denies an increase in shortness of breath.  He is using 2 l of oxygen and had an oxygen saturation of 99% today.  His bp was low today at 85/62 and 86/62 when retaken.  He is on metoprolol and cardizem.  The skin on his left chest is pink and his left upper back has hyperpigmentation.  He is using radiaplex gel.

## 2014-05-24 NOTE — Progress Notes (Signed)
  Radiation Oncology         [phone] ________________________________  Name: [name] [name] MRN: [id number]  Date: 05/24/2014  DOB: 07-29-1933  Weekly Radiation Therapy Management  Current Dose: 55.8 Gy     Planned Dose:  63 Gy  Narrative . . . . . . . . The patient presents for routine under treatment assessment.                                   The patient is without complaint. No swallowing difficulties. Patient is feeling better after receiving IV fluids last week                                 Set-up films were reviewed.                                 The chart was checked. Physical Findings. . .  height is 5\' 11"  (1.803 m) and weight is 204 lb 9.6 oz (92.806 kg). His oral temperature is 97.5 F (36.4 C). His blood pressure is 86/62 and his pulse is 93. His oxygen saturation is 99%. . The lungs are clear. The heart has a regular rhythm and rate. Impression . . . . . . . The patient is tolerating radiation. Plan . . . . . . . . . . . . Continue treatment as planned.  ________________________________   Blair Promise, PhD, MD

## 2014-05-25 ENCOUNTER — Ambulatory Visit: Payer: Medicare Other

## 2014-05-25 ENCOUNTER — Ambulatory Visit
Admission: RE | Admit: 2014-05-25 | Discharge: 2014-05-25 | Disposition: A | Discharge status: Home / Self Care | Payer: Medicare Other | Source: Ambulatory Visit | Attending: Radiation Oncology | Admitting: Radiation Oncology

## 2014-05-25 DIAGNOSIS — Z510 Encounter for antineoplastic radiation therapy: Secondary | ICD-10-CM | POA: Diagnosis not present

## 2014-05-26 ENCOUNTER — Ambulatory Visit: Payer: Medicare Other

## 2014-05-26 ENCOUNTER — Ambulatory Visit
Admission: RE | Admit: 2014-05-26 | Discharge: 2014-05-26 | Disposition: A | Discharge status: Home / Self Care | Payer: Medicare Other | Source: Ambulatory Visit | Attending: Radiation Oncology | Admitting: Radiation Oncology

## 2014-05-26 DIAGNOSIS — Z510 Encounter for antineoplastic radiation therapy: Secondary | ICD-10-CM | POA: Diagnosis not present

## 2014-05-27 ENCOUNTER — Ambulatory Visit
Admission: RE | Admit: 2014-05-27 | Discharge: 2014-05-27 | Disposition: A | Discharge status: Home / Self Care | Payer: Medicare Other | Source: Ambulatory Visit | Attending: Radiation Oncology | Admitting: Radiation Oncology

## 2014-05-27 ENCOUNTER — Ambulatory Visit: Payer: Medicare Other

## 2014-05-27 DIAGNOSIS — Z510 Encounter for antineoplastic radiation therapy: Secondary | ICD-10-CM | POA: Diagnosis not present

## 2014-05-28 ENCOUNTER — Telehealth: Payer: Self-pay | Admitting: Oncology

## 2014-05-28 NOTE — Telephone Encounter (Signed)
lvm for pt regarding to added lab on 8.3

## 2014-05-29 ENCOUNTER — Other Ambulatory Visit: Payer: Self-pay | Admitting: Oncology

## 2014-05-30 ENCOUNTER — Ambulatory Visit: Payer: Medicare Other

## 2014-05-30 ENCOUNTER — Ambulatory Visit (HOSPITAL_BASED_OUTPATIENT_CLINIC_OR_DEPARTMENT_OTHER): Payer: Medicare Other | Admitting: Oncology

## 2014-05-30 ENCOUNTER — Ambulatory Visit
Admission: RE | Admit: 2014-05-30 | Discharge: 2014-05-30 | Disposition: A | Discharge status: Home / Self Care | Payer: Medicare Other | Source: Ambulatory Visit | Attending: Radiation Oncology | Admitting: Radiation Oncology

## 2014-05-30 ENCOUNTER — Ambulatory Visit (HOSPITAL_BASED_OUTPATIENT_CLINIC_OR_DEPARTMENT_OTHER): Payer: Medicare Other

## 2014-05-30 ENCOUNTER — Other Ambulatory Visit (HOSPITAL_BASED_OUTPATIENT_CLINIC_OR_DEPARTMENT_OTHER): Payer: Medicare Other

## 2014-05-30 ENCOUNTER — Telehealth: Payer: Self-pay | Admitting: Nurse Practitioner

## 2014-05-30 VITALS — BP 92/53 | HR 75 | Temp 97.4°F | Resp 18 | Ht 71.0 in | Wt 198.9 lb

## 2014-05-30 DIAGNOSIS — C3412 Malignant neoplasm of upper lobe, left bronchus or lung: Secondary | ICD-10-CM

## 2014-05-30 DIAGNOSIS — Z510 Encounter for antineoplastic radiation therapy: Secondary | ICD-10-CM | POA: Diagnosis not present

## 2014-05-30 DIAGNOSIS — C3410 Malignant neoplasm of upper lobe, unspecified bronchus or lung: Secondary | ICD-10-CM

## 2014-05-30 DIAGNOSIS — G589 Mononeuropathy, unspecified: Secondary | ICD-10-CM

## 2014-05-30 DIAGNOSIS — D6959 Other secondary thrombocytopenia: Secondary | ICD-10-CM

## 2014-05-30 DIAGNOSIS — I4891 Unspecified atrial fibrillation: Secondary | ICD-10-CM

## 2014-05-30 DIAGNOSIS — N189 Chronic kidney disease, unspecified: Secondary | ICD-10-CM

## 2014-05-30 DIAGNOSIS — Z5111 Encounter for antineoplastic chemotherapy: Secondary | ICD-10-CM

## 2014-05-30 LAB — CBC WITH DIFFERENTIAL/PLATELET
BASO%: 0.7 % (ref 0.0–2.0)
BASOS ABS: 0 10*3/uL (ref 0.0–0.1)
EOS%: 1.3 % (ref 0.0–7.0)
Eosinophils Absolute: 0 10*3/uL (ref 0.0–0.5)
HEMATOCRIT: 30.2 % — AB (ref 38.4–49.9)
HGB: 9.8 g/dL — ABNORMAL LOW (ref 13.0–17.1)
LYMPH%: 17.6 % (ref 14.0–49.0)
MCH: 28.6 pg (ref 27.2–33.4)
MCHC: 32.5 g/dL (ref 32.0–36.0)
MCV: 88 fL (ref 79.3–98.0)
MONO#: 0.3 10*3/uL (ref 0.1–0.9)
MONO%: 8.8 % (ref 0.0–14.0)
NEUT#: 2.2 10*3/uL (ref 1.5–6.5)
NEUT%: 71.6 % (ref 39.0–75.0)
PLATELETS: 91 10*3/uL — AB (ref 140–400)
RBC: 3.43 10*6/uL — ABNORMAL LOW (ref 4.20–5.82)
RDW: 17.2 % — ABNORMAL HIGH (ref 11.0–14.6)
WBC: 3.1 10*3/uL — AB (ref 4.0–10.3)
lymph#: 0.5 10*3/uL — ABNORMAL LOW (ref 0.9–3.3)
nRBC: 0 % (ref 0–0)

## 2014-05-30 MED ORDER — DIPHENHYDRAMINE HCL 50 MG/ML IJ SOLN
INTRAMUSCULAR | Status: AC
  Filled 2014-05-30: qty 1

## 2014-05-30 MED ORDER — HEPARIN SOD (PORK) LOCK FLUSH 100 UNIT/ML IV SOLN
500.0000 [IU] | Freq: Once | INTRAVENOUS | Status: AC | PRN
  Administered 2014-05-30: 500 [IU]
  Filled 2014-05-30: qty 5

## 2014-05-30 MED ORDER — FAMOTIDINE IN NACL 20-0.9 MG/50ML-% IV SOLN
20.0000 mg | Freq: Once | INTRAVENOUS | Status: AC
  Administered 2014-05-30: 20 mg via INTRAVENOUS

## 2014-05-30 MED ORDER — PACLITAXEL CHEMO INJECTION 300 MG/50ML
45.0000 mg/m2 | Freq: Once | INTRAVENOUS | Status: AC
  Administered 2014-05-30: 102 mg via INTRAVENOUS
  Filled 2014-05-30: qty 17

## 2014-05-30 MED ORDER — ONDANSETRON 16 MG/50ML IVPB (CHCC)
INTRAVENOUS | Status: AC
  Filled 2014-05-30: qty 16

## 2014-05-30 MED ORDER — SODIUM CHLORIDE 0.9 % IV SOLN
150.0000 mg | Freq: Once | INTRAVENOUS | Status: AC
  Administered 2014-05-30: 150 mg via INTRAVENOUS
  Filled 2014-05-30: qty 15

## 2014-05-30 MED ORDER — DEXAMETHASONE SODIUM PHOSPHATE 10 MG/ML IJ SOLN
10.0000 mg | Freq: Once | INTRAMUSCULAR | Status: AC
  Administered 2014-05-30: 10 mg via INTRAVENOUS

## 2014-05-30 MED ORDER — FAMOTIDINE IN NACL 20-0.9 MG/50ML-% IV SOLN
INTRAVENOUS | Status: AC
  Filled 2014-05-30: qty 50

## 2014-05-30 MED ORDER — DEXAMETHASONE SODIUM PHOSPHATE 10 MG/ML IJ SOLN
INTRAMUSCULAR | Status: AC
  Filled 2014-05-30: qty 1

## 2014-05-30 MED ORDER — SODIUM CHLORIDE 0.9 % IJ SOLN
10.0000 mL | INTRAMUSCULAR | Status: DC | PRN
  Administered 2014-05-30: 10 mL
  Filled 2014-05-30: qty 10

## 2014-05-30 MED ORDER — ONDANSETRON 16 MG/50ML IVPB (CHCC)
16.0000 mg | Freq: Once | INTRAVENOUS | Status: AC
  Administered 2014-05-30: 16 mg via INTRAVENOUS

## 2014-05-30 MED ORDER — DIPHENHYDRAMINE HCL 50 MG/ML IJ SOLN
25.0000 mg | Freq: Once | INTRAMUSCULAR | Status: AC
  Administered 2014-05-30: 25 mg via INTRAVENOUS

## 2014-05-30 MED ORDER — SODIUM CHLORIDE 0.9 % IV SOLN
Freq: Once | INTRAVENOUS | Status: AC
  Administered 2014-05-30: 12:00:00 via INTRAVENOUS

## 2014-05-30 NOTE — Progress Notes (Signed)
  [facility] OFFICE PROGRESS NOTE   Diagnosis: Non-small cell lung cancer  INTERVAL HISTORY:   [name] [name] returns as scheduled. He completed another febrile with Taxol/carboplatin on 05/23/2014. No neuropathy symptoms. Stable dyspnea. He reports solid dysphagia. No pain.  Objective:  Vital signs in last 24 hours:  Blood pressure 92/53, pulse 75, temperature 97.4 F (36.3 C), temperature source Oral, resp. rate 18, height 5\' 11"  (1.803 m), weight 198 lb 14.4 oz (90.22 kg), SpO2 96.00%.    HEENT: No thrush or ulcer Resp: Bronchial sounds at the left upper posterior chest, no respiratory distress Cardio: Regular rate and rhythm GI: No hepatomegaly Vascular: The right lower leg is larger than the left side with trace ankle edema bilaterally  Skin: Radiation erythema at the left upper back   Portacath/PICC-without erythema  Lab Results:  Lab Results  Component Value Date   WBC 3.1* 05/30/2014   HGB 9.8* 05/30/2014   HCT 30.2* 05/30/2014   MCV 88.0 05/30/2014   PLT 91* 05/30/2014   NEUTROABS 2.2 05/30/2014    Medications: I have reviewed the patient's current medications.  Assessment/Plan: 1. 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  Initiation of chest radiation 04/05/2014.  Initiation of weekly Taxol/carboplatin chemotherapy 04/18/2014. 2. Admission 04/10/2014 with acute CHF and acute coronary syndrome, status post a cardiac catheterization 04/10/2014, clinical improvement with medical therapy.  3. COPD  4. Chronic renal failure  5. Diabetes  6. Neuropathy  7. History of non-Hodgkin's lymphoma  8. Atrial fibrillation.  9. Status post evaluation in the emergency department 05/13/2014 for chest pain. Question radiation esophagitis.  10. Oral candidiasis. He completed a 5 day course of Diflucan.    11. Thrombocytopenia secondary to chemotherapy-anus to contact us for bruising or bleeding. We does reduce the carboplatin with chemotherapy 05/30/2014.   Disposition:  [name] [name] appears stable. He will complete radiation and a final treatment with Taxol/carboplatin today. We dose reduced the carboplatin. He will return for an office visit 06/21/2014. We will plan for a restaging CT after the next office visit. He will use the Carafate as a slurry to see if this helps the dysphagia.  Betsy Coder, MD  05/30/2014  12:05 PM

## 2014-05-30 NOTE — Progress Notes (Signed)
Per Dr. Benay Spice: Treat despite PLT 91. Carboplatin dose reduced. Treatment RN made aware.

## 2014-05-30 NOTE — Patient Instructions (Signed)
[facility] Discharge Instructions for Patients Receiving Chemotherapy  Today you received the following chemotherapy agents :  Taxol, Carboplatin.  To help prevent nausea and vomiting after your treatment, we encourage you to take your nausea medication as instructed by your physician.   If you develop nausea and vomiting that is not controlled by your nausea medication, call the clinic.   BELOW ARE SYMPTOMS THAT SHOULD BE REPORTED IMMEDIATELY:  *FEVER GREATER THAN 100.5 F  *CHILLS WITH OR WITHOUT FEVER  NAUSEA AND VOMITING THAT IS NOT CONTROLLED WITH YOUR NAUSEA MEDICATION  *UNUSUAL SHORTNESS OF BREATH  *UNUSUAL BRUISING OR BLEEDING  TENDERNESS IN MOUTH AND THROAT WITH OR WITHOUT PRESENCE OF ULCERS  *URINARY PROBLEMS  *BOWEL PROBLEMS  UNUSUAL RASH Items with * indicate a potential emergency and should be followed up as soon as possible.  Feel free to call the clinic you have any questions or concerns. The clinic phone number is [phone].

## 2014-05-30 NOTE — Progress Notes (Signed)
Patient has completed 35 of 35 treatments of radiation to left chest.Denies pain.shortness of breath greater on exertion.to continue application of cream to red itchy rash on posterior back.To follow up with Dr.Kinard in one month.vitals are stable.oxygen sats 96% on 2 liters.

## 2014-05-30 NOTE — Progress Notes (Signed)
Pt saw Dr. Benay Spice today prior to chemo.  Proceed with chemo as planned with low platelet count, and dose reduced as per md.

## 2014-05-30 NOTE — Telephone Encounter (Signed)
Pt confirmed labs/ov per 08/03 POF, gave pt AVS....KJ

## 2014-05-30 NOTE — Progress Notes (Signed)
   Weekly Management Note  outpatient   ICD-9-CM  1. Malignant neoplasm of upper lobe of left lung 162.3   Completed Radiotherapy. Total Dose:  63Gy   Narrative:  The patient presents for routine under treatment assessment on last day of radiotherapy.  CBCT/MVCT images/Port film x-rays were reviewed.  The chart was checked. No new complaints.  Physical Findings:    Vitals with Age-Percentiles 05/30/2014  Length 514.6 cm  Systolic 92  Diastolic 53  Pulse 75  Respiration 18  Weight 90.22 kg  BMI 27.8  VISIT REPORT    Adequate airflow in lungs b/l. No wheezes. Oral cavity - no thrush.  Impression:  The patient has tolerated radiotherapy.  Plan:  Routine follow-up in one month.   ________________________________   Eppie Gibson, M.D.

## 2014-06-02 ENCOUNTER — Encounter: Payer: Self-pay | Admitting: Cardiovascular Disease

## 2014-06-02 ENCOUNTER — Ambulatory Visit (INDEPENDENT_AMBULATORY_CARE_PROVIDER_SITE_OTHER): Payer: Medicare Other | Admitting: Cardiovascular Disease

## 2014-06-02 VITALS — BP 92/64 | HR 66 | Resp 16 | Ht 71.0 in | Wt 202.2 lb

## 2014-06-02 DIAGNOSIS — I359 Nonrheumatic aortic valve disorder, unspecified: Secondary | ICD-10-CM

## 2014-06-02 DIAGNOSIS — I350 Nonrheumatic aortic (valve) stenosis: Secondary | ICD-10-CM

## 2014-06-02 DIAGNOSIS — I2510 Atherosclerotic heart disease of native coronary artery without angina pectoris: Secondary | ICD-10-CM

## 2014-06-02 MED ORDER — METOPROLOL TARTRATE 25 MG PO TABS: 12.5000 mg | ORAL_TABLET | Freq: Two times a day (BID) | ORAL | Status: DC

## 2014-06-02 NOTE — Patient Instructions (Signed)
Decrease Metoprolol to 1/2 tablet twice a day (12.5mg  twice a day.).  Dr. Sallyanne Kuster recommends that you schedule a follow-up appointment in: 3 months.

## 2014-06-02 NOTE — Progress Notes (Signed)
Patient ID: [name] [name], male   DOB: 1933-06-16, 80 y.o.   MRN: [id number]     Reason for office visit Angina pectoris, hypertension, PVD  [name]. [name] [name] returns for routine followup. He continues to have a remarkably positive outlook while undergoing treatment for lung cancer.  He has exertional chest pain that seems to respond to nitroglycerin, with fairly benign findings on nuclear scintigraphy and was receiving medical therapy. Catheterization was performed on 04/10/2014 when he presented with acute heart failure. He was found to have 70% ostial left main and 80% distal left main stenosis and lengthy calcified 50% proximal LAD stenosis as well as extensive stenoses in the mid right and distal right coronary artery. Because of the complexity of disease and the lung cancer diagnosis, he was continued on medical therapy. He developed brief paroxysmal atrial fibrillation with rapid ventricular response while in the hospital that resolved spontaneously. He was started on metoprolol but it was felt that anticoagulation was not indicated. He continues on aspirin. Heart failure has been well managed with a low dose of diuretic. His EKG shows a nonspecific intraventricular conduction delay, not quite a left bundle branch block.  He has well-established PAD with previous right femoral-popliteal bypass procedures. He has treated hypertension and hyperlipidemia as well as COPD. He had a large B-cell lymphoma primarily presenting as a mesenteric and retroperitoneal lymphadenopathy mass in 2010 and was diagnosed with squamous cell carcinoma of the left lung in May of 2015. He is receiving Taxol/carboplatinum chemotherapy and radiation therapy.   No Known Allergies  Current Outpatient Prescriptions  Medication Sig Dispense Refill  . aspirin 81 MG tablet Take 81 mg by mouth 2 (two) times daily.       Marland Kitchen diltiazem (CARDIZEM CD) 180 MG 24 hr capsule Take 180 mg by mouth daily.      . DULoxetine (CYMBALTA) 30 MG  capsule Take 30 mg by mouth daily.      . fenofibrate micronized (LOFIBRA) 200 MG capsule Take 200 mg by mouth daily before breakfast.      . finasteride (PROSCAR) 5 MG tablet Take 5 mg by mouth daily.      Marland Kitchen gabapentin (NEURONTIN) 600 MG tablet Take 600 mg by mouth 3 (three) times daily.       . Glucosamine-Chondroit-Vit C-Mn (GLUCOSAMINE CHONDR 1500 COMPLX) CAPS Take 1 capsule by mouth daily.      . hyaluronate sodium (RADIAPLEXRX) GEL Apply 1 application topically 2 (two) times daily.      . insulin aspart (NOVOLOG) 100 UNIT/ML injection Inject 5 Units into the skin 3 (three) times daily with meals.  10 mL  11  . insulin glargine (LANTUS) 100 UNIT/ML injection Inject 0.22 mLs (22 Units total) into the skin at bedtime.  10 mL  11  . lidocaine-prilocaine (EMLA) cream Apply 1 application topically as needed. Apply 1-2 hours prior to stick and cover with plastic wrap  30 g  5  . metoprolol tartrate (LOPRESSOR) 25 MG tablet Take 0.5 tablets (12.5 mg total) by mouth 2 (two) times daily.  30 tablet  0  . oxyCODONE-acetaminophen (PERCOCET/ROXICET) 5-325 MG per tablet Take 1 tablet by mouth every 4 (four) hours as needed for severe pain.  30 tablet  0  . pioglitazone (ACTOS) 30 MG tablet Take 30 mg by mouth daily.      . prochlorperazine (COMPAZINE) 5 MG tablet Take 1 tablet (5 mg total) by mouth every 6 (six) hours as needed for nausea or vomiting.  [place]  tablet  1  . rosuvastatin (CRESTOR) 10 MG tablet Take 10 mg by mouth at bedtime.       . sucralfate (CARAFATE) 1 G tablet Take 1 tablet (1 g total) by mouth 4 (four) times daily.  30 tablet  0  . Tamsulosin HCl (FLOMAX) 0.4 MG CAPS Take 0.4 mg by mouth daily.       No current facility-administered medications for this visit.   Facility-Administered Medications Ordered in Other Visits  Medication Dose Route Frequency Provider Last Rate Last Dose  . 0.9 %  sodium chloride infusion   Intravenous Once Drue Second, NP        Past Medical History    Diagnosis Date  . Peripheral arterial disease     s/p Ao Bifem Bypass  . Diabetes mellitus without complication   . CAD (coronary artery disease)     By  CT Scan  . Hyperlipidemia   . Hypertension   . Non Hodgkin's lymphoma 2000, 2010  . Peripheral neuropathy   . COPD (chronic obstructive pulmonary disease)   . History of lower GI bleeding   . Substance abuse   . Lung cancer, upper lobe     left lung  . AAA (abdominal aortic aneurysm)   . MI (myocardial infarction) 04/10/14    Past Surgical History  Procedure Laterality Date  . Fracture surgery  2009    Left ankle  . Pr vein bypass graft,aorto-fem-pop  2005    Right common femoral to BK popliteal BPG  . Pr vein bypass graft,aorto-fem-pop  01-27-05    Revision of Right fem-pop BPG  . Cardiac catheterization  1990's  . Video bronchoscopy Bilateral 03/14/2014    Procedure: VIDEO BRONCHOSCOPY WITH FLUORO;  Surgeon: Rigoberto Noel, MD;  Location: [facility];  Service: Cardiopulmonary;  Laterality: Bilateral;  . Nm myoview ltd  11/2013    Lexiscan.  EF 60%, small fixed inferoapical defect - ? artifact  . Transthoracic echocardiogram  11/2013    EF 60-65%, Gr 1 DD, Mod AS, Sever MAC - no MS.       Family History  Problem Relation Age of Onset  . Cancer Mother     Colon  . Heart attack Mother   . Stroke Father   . Hyperlipidemia Father   . Hyperlipidemia Sister   . Hyperlipidemia Brother   . Hyperlipidemia Daughter   . Hypertension Son     History   Social History  . Marital Status: Divorced    Spouse Name: N/A    Number of Children: 2  . Years of Education: N/A   Occupational History  . retired Administrator    Social History Main Topics  . Smoking status: Former Smoker -- 1.00 packs/day for 50 years    Types: Cigarettes    Quit date: 10/29/2003  . Smokeless tobacco: Never Used  . Alcohol Use: No  . Drug Use: No  . Sexual Activity: No   Other Topics Concern  . Not on file   Social History Narrative  . No  narrative on file    Review of systems: Functional class II exertional dyspnea, atypical chest discomfort The patient specifically denies dyspnea at rest, orthopnea, paroxysmal nocturnal dyspnea, syncope, palpitations, focal neurological deficits, intermittent claudication, lower extremity edema, unexplained weight gain, cough, hemoptysis or wheezing.  The patient also denies abdominal pain, nausea, vomiting, dysphagia, diarrhea, constipation, polyuria, polydipsia, dysuria, hematuria, frequency, urgency, abnormal bleeding or bruising, fever, chills, unexpected weight changes, mood swings, change in  skin or hair texture, change in voice quality, auditory or visual problems, allergic reactions or rashes, new musculoskeletal complaints other than usual "aches and pains".  PHYSICAL EXAM BP 92/64  Pulse 66  Resp 16  Ht 5\' 11"  (1.803 m)  Wt 202 lb 3.2 oz (91.717 kg)  BMI 28.21 kg/m2 General: Alert, oriented x3, no distress  Head: no evidence of trauma, PERRL, EOMI, no exophtalmos or lid lag, no myxedema, no xanthelasma; normal ears, nose and oropharynx  Neck: normal jugular venous pulsations and no hepatojugular reflux; brisk carotid pulses without delay and no carotid bruits  Chest: Diminished breath sounds at the left upper lung, localized wheezing over the left posterior lung field., no signs of consolidation by percussion or palpation, normal fremitus, symmetrical and full respiratory excursions  Cardiovascular: normal position and quality of the apical impulse, regular rhythm, normal first and second heart sounds, no rubs or gallops, 3/6 holosystolic murmur at apex, radiating towards base, but especially towards axilla.  Abdomen: no tenderness or distention, no masses by palpation, no abnormal pulsatility or arterial bruits, normal bowel sounds, no hepatosplenomegaly  Extremities: no clubbing, cyanosis or edema; 2+ radial, ulnar and brachial pulses bilaterally; 2+ right femoral, posterior tibial  and dorsalis pedis pulses; 2+ left femoral, posterior tibial and dorsalis pedis pulses; no subclavian or femoral bruits  Neurological: grossly nonfocal  unexplained weight gain, cough, hemoptysis or wheezing.   EKG: Sinus rhythm, incomplete left bundle branch block, inverted T waves in 1 and aVL, unchanged   Lipid Panel     Component Value Date/Time   CHOL 126 04/13/2014 0315   TRIG 157* 04/13/2014 0315   HDL 72 04/13/2014 0315   CHOLHDL 1.8 04/13/2014 0315   VLDL 31 04/13/2014 0315   LDLCALC 23 04/13/2014 0315    BMET    Component Value Date/Time   NA 140 05/23/2014 1015   NA 136* 05/13/2014 2240   K 3.4* 05/23/2014 1015   K 3.5* 05/13/2014 2240   CL 95* 05/13/2014 2240   CO2 25 05/23/2014 1015   CO2 25 05/13/2014 2240   GLUCOSE 234* 05/23/2014 1015   GLUCOSE 223* 05/13/2014 2240   BUN 4.8* 05/23/2014 1015   BUN 11 05/13/2014 2240   CREATININE 0.8 05/23/2014 1015   CREATININE 0.71 05/13/2014 2240   CALCIUM 9.0 05/23/2014 1015   CALCIUM 8.2* 05/13/2014 2240   GFRNONAA 87* 05/13/2014 2240   GFRAA >90 05/13/2014 2240     ASSESSMENT AND PLAN  [name] [name] has severe multivessel CAD , but is not a good candidate for revascularization due to his comorbidities.  As he continues to lose weight his blood pressure has become lower. His diltiazem was stopped in the hospital. He is on a relatively low dose of metoprolol. His angina is currently not a big issue. He has substantial problems with hesitancy and incomplete bladder and seeing, so he probably needs to continue taking Flomax.   Decrease metoprolol to half of the current dos. Add Ranexa if angina recurs.  Patient Instructions  Decrease Metoprolol to 1/2 tablet twice a day (12.5mg  twice a day.).  Dr. Sallyanne Kuster recommends that you schedule a follow-up appointment in: 3 months.    \ Meds ordered this encounter  Medications  . metoprolol tartrate (LOPRESSOR) 25 MG tablet    Sig: Take 0.5 tablets (12.5 mg total) by mouth 2 (two) times daily.      Dispense:  30 tablet    Refill:  0    Ashe Graybeal  Sanda Klein, MD,  [facility] [phone] office [phone] pager

## 2014-06-05 ENCOUNTER — Encounter: Payer: Self-pay | Admitting: Cardiovascular Disease

## 2014-06-08 ENCOUNTER — Encounter: Payer: Self-pay | Admitting: Radiation Oncology

## 2014-06-08 ENCOUNTER — Other Ambulatory Visit (HOSPITAL_BASED_OUTPATIENT_CLINIC_OR_DEPARTMENT_OTHER): Payer: Medicare Other

## 2014-06-08 DIAGNOSIS — D6959 Other secondary thrombocytopenia: Secondary | ICD-10-CM

## 2014-06-08 DIAGNOSIS — C3412 Malignant neoplasm of upper lobe, left bronchus or lung: Secondary | ICD-10-CM

## 2014-06-08 DIAGNOSIS — C3410 Malignant neoplasm of upper lobe, unspecified bronchus or lung: Secondary | ICD-10-CM

## 2014-06-08 LAB — CBC WITH DIFFERENTIAL/PLATELET
BASO%: 0.9 % (ref 0.0–2.0)
Basophils Absolute: 0 10*3/uL (ref 0.0–0.1)
EOS%: 1 % (ref 0.0–7.0)
Eosinophils Absolute: 0 10*3/uL (ref 0.0–0.5)
HCT: 29.2 % — ABNORMAL LOW (ref 38.4–49.9)
HGB: 9.5 g/dL — ABNORMAL LOW (ref 13.0–17.1)
LYMPH%: 22.4 % (ref 14.0–49.0)
MCH: 29.5 pg (ref 27.2–33.4)
MCHC: 32.6 g/dL (ref 32.0–36.0)
MCV: 90.6 fL (ref 79.3–98.0)
MONO#: 0.5 10*3/uL (ref 0.1–0.9)
MONO%: 23.9 % — AB (ref 0.0–14.0)
NEUT#: 1.2 10*3/uL — ABNORMAL LOW (ref 1.5–6.5)
NEUT%: 51.8 % (ref 39.0–75.0)
Platelets: 151 10*3/uL (ref 140–400)
RBC: 3.23 10*6/uL — ABNORMAL LOW (ref 4.20–5.82)
RDW: 19.2 % — ABNORMAL HIGH (ref 11.0–14.6)
WBC: 2.3 10*3/uL — ABNORMAL LOW (ref 4.0–10.3)
lymph#: 0.5 10*3/uL — ABNORMAL LOW (ref 0.9–3.3)

## 2014-06-08 NOTE — Progress Notes (Signed)
  Radiation Oncology         [phone] ________________________________  Name: [name] [name] MRN: [id number]  Date: 06/08/2014  DOB: [birth year]  End of Treatment Note  Diagnosis:    :Squamous cell carcinoma of the left lung presenting with left upper lobe bronchial obstruction and collapse    Indication for treatment:   Definitive treatment along with radiosensitizing chemotherapy     Radiation treatment dates:   June 9 through August 3  Site/dose:   Left upper chest region 63 gray in 35 fractions  Beams/energy:   3-D conformal with 5 custom shaped beams  Narrative: The patient tolerated radiation treatment relatively well.  He did experience some fatigue during the course of his treatments. His breathing overall seemed to improve. He had questionable esophageal symptoms towards end of his therapy.  Plan: The patient has completed radiation treatment. The patient will return to radiation oncology clinic for routine followup in one month. I advised them to call or return sooner if they have any questions or concerns related to their recovery or treatment.  -----------------------------------  Blair Promise, PhD, MD

## 2014-06-21 ENCOUNTER — Other Ambulatory Visit (HOSPITAL_BASED_OUTPATIENT_CLINIC_OR_DEPARTMENT_OTHER): Payer: Medicare Other

## 2014-06-21 ENCOUNTER — Ambulatory Visit (HOSPITAL_BASED_OUTPATIENT_CLINIC_OR_DEPARTMENT_OTHER): Payer: Medicare Other | Admitting: Nurse Practitioner

## 2014-06-21 ENCOUNTER — Telehealth: Payer: Self-pay | Admitting: Oncology

## 2014-06-21 VITALS — BP 100/58 | HR 77 | Temp 97.4°F | Resp 20 | Ht 71.0 in | Wt 206.9 lb

## 2014-06-21 DIAGNOSIS — C3412 Malignant neoplasm of upper lobe, left bronchus or lung: Secondary | ICD-10-CM

## 2014-06-21 DIAGNOSIS — C3410 Malignant neoplasm of upper lobe, unspecified bronchus or lung: Secondary | ICD-10-CM

## 2014-06-21 DIAGNOSIS — G5790 Unspecified mononeuropathy of unspecified lower limb: Secondary | ICD-10-CM

## 2014-06-21 LAB — CBC WITH DIFFERENTIAL/PLATELET
BASO%: 0.7 % (ref 0.0–2.0)
Basophils Absolute: 0 10*3/uL (ref 0.0–0.1)
EOS ABS: 0 10*3/uL (ref 0.0–0.5)
EOS%: 0.6 % (ref 0.0–7.0)
HCT: 29.4 % — ABNORMAL LOW (ref 38.4–49.9)
HGB: 9.4 g/dL — ABNORMAL LOW (ref 13.0–17.1)
LYMPH#: 0.7 10*3/uL — AB (ref 0.9–3.3)
LYMPH%: 15.7 % (ref 14.0–49.0)
MCH: 30 pg (ref 27.2–33.4)
MCHC: 32.1 g/dL (ref 32.0–36.0)
MCV: 93.3 fL (ref 79.3–98.0)
MONO#: 0.7 10*3/uL (ref 0.1–0.9)
MONO%: 15.7 % — ABNORMAL HIGH (ref 0.0–14.0)
NEUT%: 67.3 % (ref 39.0–75.0)
NEUTROS ABS: 3.1 10*3/uL (ref 1.5–6.5)
Platelets: 162 10*3/uL (ref 140–400)
RBC: 3.15 10*6/uL — AB (ref 4.20–5.82)
RDW: 19.9 % — AB (ref 11.0–14.6)
WBC: 4.6 10*3/uL (ref 4.0–10.3)

## 2014-06-21 NOTE — Telephone Encounter (Signed)
gv and pritned appt sched and avs for pt for Sept..Marland KitchenMarland Kitchen

## 2014-06-21 NOTE — Progress Notes (Signed)
  [place] OFFICE PROGRESS NOTE   Diagnosis:  Non-small cell lung cancer.  INTERVAL HISTORY:   Mr. Monie returns as scheduled. He completed the final radiation treatment and Taxol/carboplatin on 05/30/2014. He denies dysphagia and odynophagia. Appetite varies. Energy level is poor. He denies nausea/vomiting. He has stable dyspnea. No fever. He has an occasional cough. No hemoptysis. He has tingling in his toes which predated the chemotherapy.  Objective:  Vital signs in last 24 hours:  Blood pressure 100/58, pulse 77, temperature 97.4 F (36.3 C), temperature source Oral, resp. rate 20, height 5\' 11"  (1.803 m), weight 206 lb 14.4 oz (93.849 kg).    HEENT: No thrush or ulcers. Resp: Inspiratory rhonchi at the left lung base. No respiratory distress. Cardio: Regular rate and rhythm, occasional premature beat. GI: Abdomen soft and nontender. No organomegaly. Vascular: Pitting edema at the lower legs bilaterally right greater than left. Skin: Resolving radiation dermatitis left upper back.  Port-A-Cath site without erythema.    Lab Results:  Lab Results  Component Value Date   WBC 4.6 06/21/2014   HGB 9.4* 06/21/2014   HCT 29.4* 06/21/2014   MCV 93.3 06/21/2014   PLT 162 06/21/2014   NEUTROABS 3.1 06/21/2014    Imaging:  No results found.  Medications: I have reviewed the patient's current medications.  Assessment/Plan: Non-small cell lung cancer-left upper lobe squamous cell carcinoma  CT chest 03/14/2014 consistent with a left upper lobe mass, left hilar lymphadenopathy, and left upper lobe atelectasis, staging PET scan pending.  Initiation of chest radiation 04/05/2014. Radiation completed 05/30/2014. Initiation of weekly Taxol/carboplatin chemotherapy 04/18/2014. Final weekly Taxol/carboplatin given 05/30/2014. 2. Admission 04/10/2014 with acute CHF and acute coronary syndrome, status post a cardiac catheterization 04/10/2014, clinical improvement with  medical therapy.  3. COPD  4. Chronic renal failure  5. Diabetes  6. Neuropathy  7. History of non-Hodgkin's lymphoma  8. Atrial fibrillation.  9. Status post evaluation in the emergency department 05/13/2014 for chest pain. Question radiation esophagitis.  10. Oral candidiasis. He completed a 5 day course of Diflucan.  11. History of thrombocytopenia secondary to chemotherapy. The carboplatin was dose reduced on 05/30/2014.     Disposition: Mr. Geil appears stable. He completed the course of radiation/chemotherapy 05/30/2014. Dr. Benay Spice recommends a restaging CT scan of the chest approximately 6 weeks from completion of treatment. He will return for a followup visit one to 2 days after the CT scan to review the results.     Ned Card ANP/GNP-BC   06/21/2014  12:14 PM

## 2014-06-30 ENCOUNTER — Other Ambulatory Visit: Payer: Self-pay | Admitting: *Deleted

## 2014-06-30 MED ORDER — OXYCODONE-ACETAMINOPHEN 5-325 MG PO TABS: ORAL_TABLET | ORAL | Status: AC

## 2014-06-30 NOTE — Telephone Encounter (Signed)
[facility] 

## 2014-07-01 ENCOUNTER — Encounter: Payer: Self-pay | Admitting: Internal Medicine

## 2014-07-01 ENCOUNTER — Non-Acute Institutional Stay (SKILLED_NURSING_FACILITY): Payer: Medicare Other | Admitting: Internal Medicine

## 2014-07-01 DIAGNOSIS — F32A Depression, unspecified: Secondary | ICD-10-CM

## 2014-07-01 DIAGNOSIS — I509 Heart failure, unspecified: Secondary | ICD-10-CM

## 2014-07-01 DIAGNOSIS — N139 Obstructive and reflux uropathy, unspecified: Secondary | ICD-10-CM

## 2014-07-01 DIAGNOSIS — I10 Essential (primary) hypertension: Secondary | ICD-10-CM

## 2014-07-01 DIAGNOSIS — K219 Gastro-esophageal reflux disease without esophagitis: Secondary | ICD-10-CM

## 2014-07-01 DIAGNOSIS — R5381 Other malaise: Secondary | ICD-10-CM

## 2014-07-01 DIAGNOSIS — F3289 Other specified depressive episodes: Secondary | ICD-10-CM

## 2014-07-01 DIAGNOSIS — E785 Hyperlipidemia, unspecified: Secondary | ICD-10-CM

## 2014-07-01 DIAGNOSIS — I4891 Unspecified atrial fibrillation: Secondary | ICD-10-CM

## 2014-07-01 DIAGNOSIS — I5033 Acute on chronic diastolic (congestive) heart failure: Secondary | ICD-10-CM

## 2014-07-01 DIAGNOSIS — F329 Major depressive disorder, single episode, unspecified: Secondary | ICD-10-CM

## 2014-07-01 DIAGNOSIS — E1149 Type 2 diabetes mellitus with other diabetic neurological complication: Secondary | ICD-10-CM

## 2014-07-01 DIAGNOSIS — J189 Pneumonia, unspecified organism: Secondary | ICD-10-CM

## 2014-07-01 NOTE — Progress Notes (Signed)
Patient ID: [name] [name], male   DOB: 14-Jul-1933, 80 y.o.   MRN: [id number]     Facility: [facility]   PCP: [name], MD  Code Status: full code  No Known Allergies  Chief Complaint: new admission  HPI:  80 y/o male patient is here for STR after hospital admission from 06/23/14-06/29/14 with acute respiratory failure in setting of pulmonary edema and pneumonia. He also had afib. He required amiodarone drip. He has lung cancer, CHF, HLD, HTN and CAD He is seen in his room today He feels congested and has hoarse voice. Mentions the leg swelling has improved Has not been able to urinate and required in and out cath yesterday Today he had a foley inserted and is urinating fine Has been working with therapy team. Feels fair in terms of his energy  Review of Systems:  Constitutional: Negative for fever, chills. Positive for malaise/fatigue  HENT: Negative for hearing loss and sore throat.   Eyes: Negative for eye pain, blurred vision.  Respiratory: Negative for wheezing.  has lung cancer, on chronic oxygen and has cough with clear phlegm Cardiovascular: Negative for chest pain, palpitations, orthopnea. has leg swelling.  Gastrointestinal: Negative for heartburn, nausea, vomiting, abdominal pain, diarrhea and constipation.  Musculoskeletal: Negative for falls Skin: Negative for itching and rash.  Neurological: Negative for dizziness    Past Medical History  Diagnosis Date  . Peripheral arterial disease     s/p Ao Bifem Bypass  . Diabetes mellitus without complication   . CAD (coronary artery disease)     By  CT Scan  . Hyperlipidemia   . Hypertension   . Non Hodgkin's lymphoma 2000, 2010  . Peripheral neuropathy   . COPD (chronic obstructive pulmonary disease)   . History of lower GI bleeding   . Substance abuse   . Lung cancer, upper lobe     left lung  . AAA (abdominal aortic aneurysm)   . MI (myocardial infarction) 04/10/14   Past Surgical  History  Procedure Laterality Date  . Fracture surgery  2009    Left ankle  . Pr vein bypass graft,aorto-fem-pop  2005    Right common femoral to BK popliteal BPG  . Pr vein bypass graft,aorto-fem-pop  01-27-05    Revision of Right fem-pop BPG  . Cardiac catheterization  1990's  . Video bronchoscopy Bilateral 03/14/2014    Procedure: VIDEO BRONCHOSCOPY WITH FLUORO;  Surgeon: Rigoberto Noel, MD;  Location: [place] [facility];  Service: Cardiopulmonary;  Laterality: Bilateral;  . Nm myoview ltd  11/2013    Lexiscan.  EF 60%, small fixed inferoapical defect - ? artifact  . Transthoracic echocardiogram  11/2013    EF 60-65%, Gr 1 DD, Mod AS, Sever MAC - no MS.      Social History:   reports that he quit smoking about 10 years ago. His smoking use included Cigarettes. He has a 50 pack-year smoking history. He has never used smokeless tobacco. He reports that he does not drink alcohol or use illicit drugs.  Family History  Problem Relation Age of Onset  . Cancer Mother     Colon  . Heart attack Mother   . Stroke Father   . Hyperlipidemia Father   . Hyperlipidemia Sister   . Hyperlipidemia Brother   . Hyperlipidemia Daughter   . Hypertension Son     Medications: Patient's Medications  New Prescriptions   No medications on file  Previous Medications   AMIODARONE (PACERONE) 200 MG  TABLET    Take 200 mg by mouth 2 (two) times daily.   ASPIRIN 81 MG TABLET    Take 81 mg by mouth 2 (two) times daily.    DILTIAZEM (CARDIZEM CD) 180 MG 24 HR CAPSULE    Take 180 mg by mouth daily.   DULOXETINE (CYMBALTA) 30 MG CAPSULE    Take 30 mg by mouth daily.   FENOFIBRATE MICRONIZED (LOFIBRA) 200 MG CAPSULE    Take 200 mg by mouth daily before breakfast.   FINASTERIDE (PROSCAR) 5 MG TABLET    Take 5 mg by mouth daily.   GABAPENTIN (NEURONTIN) 600 MG TABLET    Take 600 mg by mouth 3 (three) times daily.    GLUCOSAMINE-CHONDROIT-VIT C-MN (GLUCOSAMINE CHONDR 1500 COMPLX) CAPS    Take 1 capsule by mouth daily.    HYALURONATE SODIUM (RADIAPLEXRX) GEL    Apply 1 application topically 2 (two) times daily.   INSULIN ASPART (NOVOLOG) 100 UNIT/ML INJECTION    Inject 5 Units into the skin 3 (three) times daily with meals.   INSULIN GLARGINE (LANTUS) 100 UNIT/ML INJECTION    Inject 0.22 mLs (22 Units total) into the skin at bedtime.   LEVOFLOXACIN (LEVAQUIN) 750 MG TABLET    Take 750 mg by mouth daily.   METOPROLOL TARTRATE (LOPRESSOR) 25 MG TABLET    Take 0.5 tablets (12.5 mg total) by mouth 2 (two) times daily.   OXYCODONE-ACETAMINOPHEN (PERCOCET/ROXICET) 5-325 MG PER TABLET    Take one tablet by mouth every 4 hours as needed for pain   PIOGLITAZONE (ACTOS) 30 MG TABLET    Take 30 mg by mouth daily.   PROCHLORPERAZINE (COMPAZINE) 5 MG TABLET    Take 1 tablet (5 mg total) by mouth every 6 (six) hours as needed for nausea or vomiting.   ROSUVASTATIN (CRESTOR) 10 MG TABLET    Take 10 mg by mouth at bedtime.    SUCRALFATE (CARAFATE) 1 G TABLET    Take 1 tablet (1 g total) by mouth 4 (four) times daily.   TAMSULOSIN HCL (FLOMAX) 0.4 MG CAPS    Take 0.4 mg by mouth daily.  Modified Medications   No medications on file  Discontinued Medications   LIDOCAINE-PRILOCAINE (EMLA) CREAM    Apply 1 application topically as needed. Apply 1-2 hours prior to stick and cover with plastic wrap     Physical Exam: Filed Vitals:   07/01/14 1411  BP: 113/85  Pulse: 84  Temp: 98 F (36.7 C)  Resp: 18  Height: 5\' 11"  (1.803 m)  Weight: 215 lb (97.523 kg)  SpO2: 97%    General- elderly male in no acute distress Head- atraumatic, normocephalic Eyes- no pallor, no icterus, no discharge Neck- no lymphadenopathy Throat- moist mucus membrane, normal oropharynx Cardiovascular- irregular heart rate, no murmur, normal distal pulses, has porta cath in place and site clean on right chest Respiratory- bilateral decreased air entry left > right, no wheeze, rhonchi or crackles. On o2 3l/min. No use of accessory muscles Abdomen-  bowel sounds present, soft, non tender, no CVA tenderness, has a foley in place Musculoskeletal- able to move all 4 extremities, leg edema 2+ Neurological- no focal deficit, aao x 3 Skin- warm and dry Psychiatry- normal mood and affect  Labs reviewed: Basic Metabolic Panel:  Recent Labs  04/14/14 0525 04/15/14 0525 04/16/14 0508  05/13/14 2240 05/16/14 0934 05/23/14 1015  NA 132* 128* 133*  < > 136* 137 140  K 4.3 4.2 4.6  < > 3.5*  3.3* 3.4*  CL 92* 89* 94*  --  95*  --   --   CO2 30 28 28   < > 25 24 25   GLUCOSE 252* 249* 257*  < > 223* 224* 234*  BUN 19 19 19   < > 11 7.9 4.8*  CREATININE 0.75 0.69 0.76  < > 0.71 0.9 0.8  CALCIUM 9.1 9.0 9.8  < > 8.2* 9.8 9.0  MG 1.5 1.3* 1.4*  --   --   --   --   < > = values in this interval not displayed. Liver Function Tests:  Recent Labs  05/09/14 0857 05/16/14 0934 05/23/14 1015  AST 18 26 21   ALT 19 21 22   ALKPHOS 50 49 58  BILITOT 0.82 0.94 0.68  PROT 6.8 6.7 6.0*  ALBUMIN 3.0* 3.1* 2.8*   No results found for this basename: LIPASE, AMYLASE,  in the last 8760 hours No results found for this basename: AMMONIA,  in the last 8760 hours CBC:  Recent Labs  05/30/14 0916 06/08/14 0929 06/21/14 1037  WBC 3.1* 2.3* 4.6  NEUTROABS 2.2 1.2* 3.1  HGB 9.8* 9.5* 9.4*  HCT 30.2* 29.2* 29.4*  MCV 88.0 90.6 93.3  PLT 91* 151 162   Cardiac Enzymes:  Recent Labs  03/21/14 2352 03/22/14 0447 04/10/14 1145  TROPONINI <0.30 <0.30 0.55*   BNP: No components found with this basename: POCBNP,  CBG:  Recent Labs  04/15/14 1649 04/16/14 0755 04/16/14 1215  GLUCAP 190* 232* 343*    06/29/14 na 132, k 3.7, bun 25, cr 1.3, glu 98, ca 8.6  Assessment/Plan  Physical deconditioning Will have him work with physical therapy and occupational therapy team to help with gait training and muscle strengthening exercises.fall precautions. Skin care. Encourage to be out of bed.   chf Recent exacerbation, with his congestion and  edema, monitor closely. Monitor weight daily. Continue metoprolol tartae 12.5 mg bid. If has weight gain, consider diuretics  Pneumonia Will complete his antibiotics on 07/04/14. Has lung cancer increasing risk for post obstruction pneumonia, monitor clinically  afib Rate currently controlled. Continue amiodarone 200 mg bid, aspiirn   Hyperlipidemia Continue crestor 10 mg qhs and fenofibrate  Depression Stable, continue duloxetine 30 mg daily  DM Monitor cbg, continue lantus 22 u qhs and premeal novolog and pioglitazone  Urinary obstruction On review of hospital records has been on med for bph and this was held in hospital, this could have contributed to his obstruction in outflow, has foley at present, monitor output. Continue tamsulosin and reintroduce finateride at 5 mg daily  gerd Continue sucralfate for now   Family/ staff Communication: reviewed care plan with patient and nursing supervisor   Goals of care: short term rehabilitation    Labs/tests ordered: cbc, cmp    Blanchie Serve, MD  [place] [phone] (Monday-Friday 8 am - 5 pm) [phone] (afterhours)

## 2014-07-02 ENCOUNTER — Other Ambulatory Visit: Payer: Self-pay | Admitting: Internal Medicine

## 2014-07-02 LAB — BASIC METABOLIC PANEL
BUN: 18 mg/dL (ref 6–23)
CO2: 31 mEq/L (ref 19–32)
Calcium: 8.2 mg/dL — ABNORMAL LOW (ref 8.4–10.5)
Chloride: 96 mEq/L (ref 96–112)
Creat: 1.13 mg/dL (ref 0.50–1.35)
Glucose, Bld: 82 mg/dL (ref 70–99)
Potassium: 3.1 mEq/L — ABNORMAL LOW (ref 3.5–5.3)
Sodium: 135 mEq/L (ref 135–145)

## 2014-07-05 ENCOUNTER — Non-Acute Institutional Stay (SKILLED_NURSING_FACILITY): Payer: Medicare Other | Admitting: Internal Medicine

## 2014-07-05 ENCOUNTER — Encounter: Payer: Self-pay | Admitting: Internal Medicine

## 2014-07-05 DIAGNOSIS — I509 Heart failure, unspecified: Secondary | ICD-10-CM

## 2014-07-05 DIAGNOSIS — I5033 Acute on chronic diastolic (congestive) heart failure: Secondary | ICD-10-CM

## 2014-07-05 NOTE — Progress Notes (Signed)
Patient ID: [name] [name], male   DOB: 1932-12-14, 80 y.o.   MRN: [id number]  Location:  [facility] Provider:  Rexene Edison. Mariea Clonts, D.O., C.M.D.  Code Status:  Full code  Chief Complaint  Patient presents with  . Acute Visit    increased edema, family concerned, was put on kcl friday when labs returned    HPI:  80 yo white male with lung cancer, chf, hyperlipidemia, htn, and CAD here for rehab after his last hospitalization 8/27-9/2 with acute respiratory failure from pulmonary edema and pneumonia.  He was in afib and was started on amiodarone.    I saw him acutely due to increased peripheral edema.  He was started on potassium last friday when his labs returned with hypokalemia.  Just a 2 day course.  His weight is up to 214 from 210 since Saturday (today is Tuesday).  He is not on diuretics at all at this time.    He also has been having urinary retention and a catheter was placed.    Review of Systems:  Review of Systems  Constitutional: Positive for malaise/fatigue. Negative for fever.  HENT: Negative for congestion.   Eyes: Negative for blurred vision.  Respiratory: Positive for cough and shortness of breath. Negative for sputum production.   Cardiovascular: Positive for leg swelling. Negative for chest pain.  Gastrointestinal: Negative for abdominal pain.  Genitourinary: Negative for dysuria and urgency.       Urinary retention, foley in place  Musculoskeletal: Negative for falls.  Neurological: Positive for dizziness and weakness. Negative for loss of consciousness.  Endo/Heme/Allergies: Bruises/bleeds easily.  Psychiatric/Behavioral:       Some confusion intermittently    Medications: Patient's Medications  New Prescriptions   No medications on file  Previous Medications   AMIODARONE (PACERONE) 200 MG TABLET    Take 200 mg by mouth 2 (two) times daily.   ASPIRIN 81 MG TABLET    Take 81 mg by mouth 2 (two) times daily.    DILTIAZEM (CARDIZEM CD) 180 MG  24 HR CAPSULE    Take 180 mg by mouth daily.   DULOXETINE (CYMBALTA) 30 MG CAPSULE    Take 30 mg by mouth daily.   FENOFIBRATE MICRONIZED (LOFIBRA) 200 MG CAPSULE    Take 200 mg by mouth daily before breakfast.   FINASTERIDE (PROSCAR) 5 MG TABLET    Take 5 mg by mouth daily.   GABAPENTIN (NEURONTIN) 600 MG TABLET    Take 600 mg by mouth 3 (three) times daily.    GLUCOSAMINE-CHONDROIT-VIT C-MN (GLUCOSAMINE CHONDR 1500 COMPLX) CAPS    Take 1 capsule by mouth daily.   HYALURONATE SODIUM (RADIAPLEXRX) GEL    Apply 1 application topically 2 (two) times daily.   INSULIN ASPART (NOVOLOG) 100 UNIT/ML INJECTION    Inject 5 Units into the skin 3 (three) times daily with meals.   INSULIN GLARGINE (LANTUS) 100 UNIT/ML INJECTION    Inject 0.22 mLs (22 Units total) into the skin at bedtime.   LEVOFLOXACIN (LEVAQUIN) 750 MG TABLET    Take 750 mg by mouth daily.   METOPROLOL TARTRATE (LOPRESSOR) 25 MG TABLET    Take 0.5 tablets (12.5 mg total) by mouth 2 (two) times daily.   OXYCODONE-ACETAMINOPHEN (PERCOCET/ROXICET) 5-325 MG PER TABLET    Take one tablet by mouth every 4 hours as needed for pain   PIOGLITAZONE (ACTOS) 30 MG TABLET    Take 30 mg by mouth daily.   PROCHLORPERAZINE (COMPAZINE) 5 MG TABLET  Take 1 tablet (5 mg total) by mouth every 6 (six) hours as needed for nausea or vomiting.   ROSUVASTATIN (CRESTOR) 10 MG TABLET    Take 10 mg by mouth at bedtime.    SUCRALFATE (CARAFATE) 1 G TABLET    Take 1 tablet (1 g total) by mouth 4 (four) times daily.   TAMSULOSIN HCL (FLOMAX) 0.4 MG CAPS    Take 0.4 mg by mouth daily.  Modified Medications   No medications on file  Discontinued Medications   No medications on file    Physical Exam: Filed Vitals:   07/05/14 1239  BP: 105/55  Pulse: 77  Temp: 97.4 F (36.3 C)  Resp: 20  Height: 5\' 11"  (1.803 m)  Weight: 210 lb (95.255 kg)  SpO2: 96%  Physical Exam  Constitutional: No distress.  Cardiovascular: Normal rate and regular rhythm.     Pulmonary/Chest:  Coarse wet rhonchi and rales throughout; diminished at bases  Abdominal: Soft. Bowel sounds are normal.  Genitourinary:  Foley with yellow urine  Musculoskeletal: Normal range of motion.  Neurological: He is alert.  Skin: Skin is warm and dry.    Labs reviewed: Basic Metabolic Panel:  Recent Labs  04/14/14 0525 04/15/14 0525 04/16/14 0508  05/13/14 2240 05/16/14 0934 05/23/14 1015 07/02/14 0815  NA 132* 128* 133*  < > 136* 137 140 135  K 4.3 4.2 4.6  < > 3.5* 3.3* 3.4* 3.1*  CL 92* 89* 94*  --  95*  --   --  96  CO2 30 28 28   < > 25 24 25 31   GLUCOSE 252* 249* 257*  < > 223* 224* 234* 82  BUN 19 19 19   < > 11 7.9 4.8* 18  CREATININE 0.75 0.69 0.76  < > 0.71 0.9 0.8 1.13  CALCIUM 9.1 9.0 9.8  < > 8.2* 9.8 9.0 8.2*  MG 1.5 1.3* 1.4*  --   --   --   --   --   < > = values in this interval not displayed.  Liver Function Tests:  Recent Labs  05/09/14 0857 05/16/14 0934 05/23/14 1015  AST 18 26 21   ALT 19 21 22   ALKPHOS 50 49 58  BILITOT 0.82 0.94 0.68  PROT 6.8 6.7 6.0*  ALBUMIN 3.0* 3.1* 2.8*    CBC:  Recent Labs  05/30/14 0916 06/08/14 0929 06/21/14 1037  WBC 3.1* 2.3* 4.6  NEUTROABS 2.2 1.2* 3.1  HGB 9.8* 9.5* 9.4*  HCT 30.2* 29.2* 29.4*  MCV 88.0 90.6 93.3  PLT 91* 151 162   Pending labs  Assessment/Plan 1. Acute on chronic diastolic congestive heart failure Add daily weights with call parameters Not on any diuretics right now Await bmp to return for day, and if renal function stable, will start on lasix and potassium  Family/ staff Communication: seen with DNS  Goals of care: short term rehab and return home  Labs/tests ordered:  bmp

## 2014-07-06 ENCOUNTER — Ambulatory Visit
Admission: RE | Admit: 2014-07-06 | Discharge: 2014-07-06 | Disposition: A | Discharge status: Home / Self Care | Payer: Medicare Other | Source: Ambulatory Visit | Attending: Radiation Oncology | Admitting: Radiation Oncology

## 2014-07-06 ENCOUNTER — Telehealth: Payer: Self-pay | Admitting: Oncology

## 2014-07-06 ENCOUNTER — Encounter: Payer: Self-pay | Admitting: Radiation Oncology

## 2014-07-06 VITALS — BP 95/60 | HR 119 | Temp 97.4°F | Resp 20 | Ht 71.0 in | Wt 217.0 lb

## 2014-07-06 DIAGNOSIS — C3412 Malignant neoplasm of upper lobe, left bronchus or lung: Secondary | ICD-10-CM

## 2014-07-06 NOTE — Telephone Encounter (Signed)
Called [name] at [facility].  She wanted to reschedule [name] [name]' appointment with Dr. [name] for this afternoon.  Advised her that he can come in at 3:00 this afternoon.  [name] verbalized agreement.

## 2014-07-06 NOTE — Progress Notes (Signed)
Radiation Oncology         [phone] ________________________________  Name: [name] [name] MRN: [id number]  Date: 07/06/2014  DOB: Jan 22, 1933  Follow-Up Visit Note  CC: [name], MD  [name], MD  Diagnosis: Squamous cell carcinoma of the left lung presenting with left upper lobe bronchial obstruction and collapse     Interval Since Last Radiation:  1  months  Narrative:  The patient returns today for routine follow-up.  He is residing in the [facility] at this time. He has developed some hoarseness. He denies any pain within the chest area significant cough or hemoptysis. He denies any swallowing difficulties. He is on 3 L of oxygen by nasal cannula. Patient reports dyspnea with exertion. He did recently have a Foley catheter placed.                              ALLERGIES:  has No Known Allergies.  Meds: Current Outpatient Prescriptions  Medication Sig Dispense Refill  . amiodarone (PACERONE) 200 MG tablet Take 200 mg by mouth 2 (two) times daily.      Marland Kitchen aspirin 81 MG tablet Take 81 mg by mouth 2 (two) times daily.       . DULoxetine (CYMBALTA) 30 MG capsule Take 30 mg by mouth daily.      . fenofibrate micronized (LOFIBRA) 200 MG capsule Take 200 mg by mouth daily before breakfast.      . finasteride (PROSCAR) 5 MG tablet Take 5 mg by mouth daily.      . furosemide (LASIX) 40 MG tablet Take 40 mg by mouth daily.      Marland Kitchen gabapentin (NEURONTIN) 600 MG tablet Take 600 mg by mouth 3 (three) times daily.       . Glucosamine-Chondroit-Vit C-Mn (GLUCOSAMINE CHONDR 1500 COMPLX) CAPS Take 1 capsule by mouth daily.      . insulin aspart (NOVOLOG) 100 UNIT/ML injection Inject 5 Units into the skin 3 (three) times daily with meals.  10 mL  11  . insulin glargine (LANTUS) 100 UNIT/ML injection Inject 0.22 mLs (22 Units total) into the skin at bedtime.  10 mL  11  . metoprolol tartrate (LOPRESSOR) 25 MG tablet Take 0.5 tablets (12.5 mg total) by mouth 2 (two) times daily.   30 tablet  0  . oxyCODONE-acetaminophen (PERCOCET/ROXICET) 5-325 MG per tablet Take one tablet by mouth every 4 hours as needed for pain  180 tablet  0  . pioglitazone (ACTOS) 30 MG tablet Take 30 mg by mouth daily.      . potassium chloride (K-DUR) 10 MEQ tablet Take 20 mEq by mouth daily.      . prochlorperazine (COMPAZINE) 5 MG tablet Take 1 tablet (5 mg total) by mouth every 6 (six) hours as needed for nausea or vomiting.  60 tablet  1  . rosuvastatin (CRESTOR) 10 MG tablet Take 10 mg by mouth at bedtime.       . sucralfate (CARAFATE) 1 G tablet Take 1 tablet (1 g total) by mouth 4 (four) times daily.  30 tablet  0  . Tamsulosin HCl (FLOMAX) 0.4 MG CAPS Take 0.4 mg by mouth daily.      Marland Kitchen diltiazem (CARDIZEM CD) 180 MG 24 hr capsule Take 180 mg by mouth daily.      . hyaluronate sodium (RADIAPLEXRX) GEL Apply 1 application topically 2 (two) times daily.      Marland Kitchen levofloxacin (  LEVAQUIN) 750 MG tablet Take 750 mg by mouth daily.       No current facility-administered medications for this encounter.   Facility-Administered Medications Ordered in Other Encounters  Medication Dose Route Frequency Provider Last Rate Last Dose  . 0.9 %  sodium chloride infusion   Intravenous Once Drue Second, NP        Physical Findings: The patient is in no acute distress. Patient is alert and oriented.  height is 5\' 11"  (1.803 m) and weight is 217 lb (98.431 kg). His oral temperature is 97.4 F (36.3 C). His blood pressure is 95/60 and his pulse is 119. His respiration is 20 and oxygen saturation is 100%. .  The lungs are clear. The heart has a regular rhythm with increased rate.  Lab Findings: Lab Results  Component Value Date   WBC 4.6 06/21/2014   HGB 9.4* 06/21/2014   HCT 29.4* 06/21/2014   MCV 93.3 06/21/2014   PLT 162 06/21/2014      Radiographic Findings: No results found.  Impression:  Clinically stable  Plan:  When necessary followup in radiation oncology. The patient will be seen by  medical oncology later this month with a chest CT scan ordered at that time.  ____________________________________ Blair Promise, MD

## 2014-07-06 NOTE — Progress Notes (Signed)
[name] [name] here for follow up after treatment to his left lung.  He is currently living at [facility].  He reports being there for about 2 weeks.He denies pain, sore throat and hemoptysis.  He does cough occasionally.  He reports shortness of breath with walking distances.  He is using 3 l of oxygen via nasal canula.  His voice is hoarse which he reports is from using a CPAP.  He is no longer using the CPAP at night.  He also has a foley catheter draining clear, yellow urine.  His skin is intact in the treatment area.

## 2014-07-06 NOTE — Addendum Note (Signed)
Encounter addended by: Blair Promise, MD on: 07/06/2014  4:58 PM<BR>     Documentation filed: Follow-up Section, LOS Section, Problem List, Visit Diagnoses

## 2014-07-08 ENCOUNTER — Other Ambulatory Visit: Payer: Self-pay | Admitting: Internal Medicine

## 2014-07-08 LAB — CBC WITH DIFFERENTIAL/PLATELET
HCT: 29.8 % — ABNORMAL LOW (ref 39.0–52.0)
Hemoglobin: 9.6 g/dL — ABNORMAL LOW (ref 13.0–17.0)
Lymphocytes Relative: 29 % (ref 12–46)
Lymphs Abs: 1.5 10*3/uL (ref 0.7–4.0)
MCH: 30.8 pg (ref 26.0–34.0)
MCHC: 32.1 g/dL (ref 30.0–36.0)
MCV: 96 fL (ref 78.0–100.0)
Monocytes Absolute: 0.5 10*3/uL (ref 0.1–1.0)
Monocytes Relative: 10 % (ref 3–12)
Neutro Abs: 3.2 10*3/uL (ref 1.7–7.7)
Neutrophils Relative %: 62 % (ref 43–77)
Platelets: 123 10*3/uL — ABNORMAL LOW (ref 150–400)
RBC: 3.11 MIL/uL — ABNORMAL LOW (ref 4.22–5.81)
RDW: 19.8 % — ABNORMAL HIGH (ref 11.5–15.5)
WBC: 5.2 10*3/uL (ref 4.0–10.5)

## 2014-07-08 LAB — COMPREHENSIVE METABOLIC PANEL
ALT: 45 U/L (ref 0–53)
AST: 27 U/L (ref 0–37)
Albumin: 2.9 g/dL — ABNORMAL LOW (ref 3.5–5.2)
Alkaline Phosphatase: 46 U/L (ref 39–117)
BUN: 17 mg/dL (ref 6–23)
CO2: 35 mEq/L — ABNORMAL HIGH (ref 19–32)
Calcium: 9.2 mg/dL (ref 8.4–10.5)
Chloride: 96 mEq/L (ref 96–112)
Creat: 1.1 mg/dL (ref 0.50–1.35)
Glucose, Bld: 80 mg/dL (ref 70–99)
Potassium: 4.1 mEq/L (ref 3.5–5.3)
Sodium: 137 mEq/L (ref 135–145)
Total Bilirubin: 0.7 mg/dL (ref 0.2–1.2)
Total Protein: 5.7 g/dL — ABNORMAL LOW (ref 6.0–8.3)

## 2014-07-09 ENCOUNTER — Other Ambulatory Visit: Payer: Self-pay | Admitting: Internal Medicine

## 2014-07-09 LAB — CBC WITH DIFFERENTIAL/PLATELET
Basophils Absolute: 0 10*3/uL (ref 0.0–0.1)
Basophils Relative: 0 % (ref 0–1)
Eosinophils Absolute: 0.1 10*3/uL (ref 0.0–0.7)
Eosinophils Relative: 1 % (ref 0–5)
HCT: 31.5 % — ABNORMAL LOW (ref 39.0–52.0)
Hemoglobin: 10 g/dL — ABNORMAL LOW (ref 13.0–17.0)
Lymphocytes Relative: 14 % (ref 12–46)
Lymphs Abs: 0.9 10*3/uL (ref 0.7–4.0)
MCH: 30.1 pg (ref 26.0–34.0)
MCHC: 31.7 g/dL (ref 30.0–36.0)
MCV: 94.9 fL (ref 78.0–100.0)
Monocytes Absolute: 1 10*3/uL (ref 0.1–1.0)
Monocytes Relative: 15 % — ABNORMAL HIGH (ref 3–12)
Neutro Abs: 4.7 10*3/uL (ref 1.7–7.7)
Neutrophils Relative %: 70 % (ref 43–77)
Platelets: 142 10*3/uL — ABNORMAL LOW (ref 150–400)
RBC: 3.32 MIL/uL — ABNORMAL LOW (ref 4.22–5.81)
RDW: 18.1 % — ABNORMAL HIGH (ref 11.5–15.5)
WBC: 6.7 10*3/uL (ref 4.0–10.5)

## 2014-07-11 ENCOUNTER — Ambulatory Visit (HOSPITAL_COMMUNITY)
Admission: RE | Admit: 2014-07-11 | Discharge: 2014-07-11 | Disposition: A | Discharge status: Home / Self Care | Payer: Medicare Other | Source: Ambulatory Visit | Attending: Nurse Practitioner | Admitting: Nurse Practitioner

## 2014-07-11 ENCOUNTER — Encounter (HOSPITAL_COMMUNITY): Payer: Self-pay

## 2014-07-11 ENCOUNTER — Other Ambulatory Visit (HOSPITAL_BASED_OUTPATIENT_CLINIC_OR_DEPARTMENT_OTHER): Payer: Medicare Other

## 2014-07-11 ENCOUNTER — Ambulatory Visit (HOSPITAL_BASED_OUTPATIENT_CLINIC_OR_DEPARTMENT_OTHER): Payer: Medicare Other

## 2014-07-11 DIAGNOSIS — C3410 Malignant neoplasm of upper lobe, unspecified bronchus or lung: Secondary | ICD-10-CM

## 2014-07-11 DIAGNOSIS — C3412 Malignant neoplasm of upper lobe, left bronchus or lung: Secondary | ICD-10-CM

## 2014-07-11 DIAGNOSIS — Z95828 Presence of other vascular implants and grafts: Secondary | ICD-10-CM

## 2014-07-11 LAB — BASIC METABOLIC PANEL (CC13)
Anion Gap: 13 mEq/L — ABNORMAL HIGH (ref 3–11)
BUN: 44.1 mg/dL — ABNORMAL HIGH (ref 7.0–26.0)
CHLORIDE: 94 meq/L — AB (ref 98–109)
CO2: 23 meq/L (ref 22–29)
CREATININE: 1.6 mg/dL — AB (ref 0.7–1.3)
Calcium: 9.1 mg/dL (ref 8.4–10.4)
Glucose: 60 mg/dl — ABNORMAL LOW (ref 70–140)
POTASSIUM: 4.3 meq/L (ref 3.5–5.1)
SODIUM: 130 meq/L — AB (ref 136–145)

## 2014-07-11 MED ORDER — IOHEXOL 300 MG/ML  SOLN
80.0000 mL | Freq: Once | INTRAMUSCULAR | Status: AC | PRN
  Administered 2014-07-11: 80 mL via INTRAVENOUS

## 2014-07-11 MED ORDER — SODIUM CHLORIDE 0.9 % IJ SOLN
10.0000 mL | INTRAMUSCULAR | Status: DC | PRN
  Administered 2014-07-11: 10 mL via INTRAVENOUS
  Filled 2014-07-11: qty 10

## 2014-07-11 NOTE — Patient Instructions (Signed)

## 2014-07-12 ENCOUNTER — Other Ambulatory Visit (HOSPITAL_BASED_OUTPATIENT_CLINIC_OR_DEPARTMENT_OTHER): Payer: Medicare Other

## 2014-07-12 ENCOUNTER — Ambulatory Visit (HOSPITAL_BASED_OUTPATIENT_CLINIC_OR_DEPARTMENT_OTHER): Payer: Medicare Other | Admitting: Oncology

## 2014-07-12 ENCOUNTER — Non-Acute Institutional Stay (SKILLED_NURSING_FACILITY): Payer: Medicare Other | Admitting: Internal Medicine

## 2014-07-12 ENCOUNTER — Telehealth: Payer: Self-pay | Admitting: Oncology

## 2014-07-12 ENCOUNTER — Encounter: Payer: Self-pay | Admitting: Internal Medicine

## 2014-07-12 VITALS — BP 101/54 | HR 90 | Temp 97.5°F | Resp 18 | Ht 71.0 in | Wt 220.2 lb

## 2014-07-12 DIAGNOSIS — C3412 Malignant neoplasm of upper lobe, left bronchus or lung: Secondary | ICD-10-CM

## 2014-07-12 DIAGNOSIS — J90 Pleural effusion, not elsewhere classified: Secondary | ICD-10-CM

## 2014-07-12 DIAGNOSIS — J449 Chronic obstructive pulmonary disease, unspecified: Secondary | ICD-10-CM

## 2014-07-12 DIAGNOSIS — C3410 Malignant neoplasm of upper lobe, unspecified bronchus or lung: Secondary | ICD-10-CM

## 2014-07-12 DIAGNOSIS — E1165 Type 2 diabetes mellitus with hyperglycemia: Secondary | ICD-10-CM

## 2014-07-12 DIAGNOSIS — N189 Chronic kidney disease, unspecified: Secondary | ICD-10-CM

## 2014-07-12 DIAGNOSIS — I509 Heart failure, unspecified: Secondary | ICD-10-CM

## 2014-07-12 DIAGNOSIS — I5033 Acute on chronic diastolic (congestive) heart failure: Secondary | ICD-10-CM

## 2014-07-12 DIAGNOSIS — R609 Edema, unspecified: Secondary | ICD-10-CM

## 2014-07-12 DIAGNOSIS — E1149 Type 2 diabetes mellitus with other diabetic neurological complication: Secondary | ICD-10-CM

## 2014-07-12 DIAGNOSIS — IMO0002 Reserved for concepts with insufficient information to code with codable children: Secondary | ICD-10-CM

## 2014-07-12 LAB — BASIC METABOLIC PANEL (CC13)
ANION GAP: 11 meq/L (ref 3–11)
BUN: 35.7 mg/dL — ABNORMAL HIGH (ref 7.0–26.0)
CO2: 27 mEq/L (ref 22–29)
Calcium: 9 mg/dL (ref 8.4–10.4)
Chloride: 94 mEq/L — ABNORMAL LOW (ref 98–109)
Creatinine: 1.2 mg/dL (ref 0.7–1.3)
GLUCOSE: 65 mg/dL — AB (ref 70–140)
POTASSIUM: 3.8 meq/L (ref 3.5–5.1)
SODIUM: 132 meq/L — AB (ref 136–145)

## 2014-07-12 MED ORDER — POTASSIUM CHLORIDE ER 20 MEQ PO TBCR: 40.0000 meq | EXTENDED_RELEASE_TABLET | Freq: Every day | ORAL | Status: AC

## 2014-07-12 MED ORDER — LINAGLIPTIN 5 MG PO TABS: 5.0000 mg | ORAL_TABLET | Freq: Every day | ORAL | Status: AC

## 2014-07-12 NOTE — Progress Notes (Signed)
Patient ID: [name] [name], male   DOB: 10-19-1933, 80 y.o.   MRN: [id number]   Location:  [facility] [place] [name] [name], D.O.  Code Status: full code  No Known Allergies  Chief Complaint  Patient presents with  . Acute Visit    f/u on CXR from 9/11 with CHF with left basilar airspace diseaes and small left pleural effusion    HPI: Patient is a 80 y.o. with h/o lung cancer, chronic diastolic chf, diabetes with neurologic complications that's been poorly controlled lately when receiving cancer treatments seen for an acute visit due to CHF findings on his CXR. Had 11 lb weight gain since 8/6, new effusion on cxr, edema, increased dyspnea Difficult to tell how much due to his lung cancer and treatments and how much is truly from chf No improvement in weight with going from 20mg  to 40mg  of lasix  Review of Systems:  Review of Systems  Constitutional: Positive for malaise/fatigue. Negative for fever.  HENT: Negative for congestion.   Respiratory: Positive for cough, sputum production, shortness of breath and wheezing.   Cardiovascular: Positive for orthopnea, leg swelling and PND. Negative for chest pain and palpitations.  Gastrointestinal: Negative for abdominal pain.  Genitourinary: Negative for dysuria.  Musculoskeletal: Negative for falls.  Neurological: Positive for dizziness and weakness.  Endo/Heme/Allergies: Bruises/bleeds easily.  Psychiatric/Behavioral: Positive for memory loss.       Intermittently confused (delirium)     Past Medical History  Diagnosis Date  . Peripheral arterial disease     s/p Ao Bifem Bypass  . Diabetes mellitus without complication   . CAD (coronary artery disease)     By  CT Scan  . Hyperlipidemia   . Hypertension   . Non Hodgkin's lymphoma 2000, 2010  . Peripheral neuropathy   . COPD (chronic obstructive pulmonary disease)   . History of lower GI bleeding   . Substance abuse   . Lung cancer, upper lobe     left lung  . AAA  (abdominal aortic aneurysm)   . MI (myocardial infarction) 04/10/14    Past Surgical History  Procedure Laterality Date  . Fracture surgery  2009    Left ankle  . Pr vein bypass graft,aorto-fem-pop  2005    Right common femoral to BK popliteal BPG  . Pr vein bypass graft,aorto-fem-pop  01-27-05    Revision of Right fem-pop BPG  . Cardiac catheterization  1990's  . Video bronchoscopy Bilateral 03/14/2014    Procedure: VIDEO BRONCHOSCOPY WITH FLUORO;  Surgeon: Rigoberto Noel, MD;  Location: [place];  Service: Cardiopulmonary;  Laterality: Bilateral;  . Nm myoview ltd  11/2013    Lexiscan.  EF 60%, small fixed inferoapical defect - ? artifact  . Transthoracic echocardiogram  11/2013    EF 60-65%, Gr 1 DD, Mod AS, Sever MAC - no MS.       Social History:   reports that he quit smoking about 10 years ago. His smoking use included Cigarettes. He has a 50 pack-year smoking history. He has never used smokeless tobacco. He reports that he does not drink alcohol or use illicit drugs.  Family History  Problem Relation Age of Onset  . Cancer Mother     Colon  . Heart attack Mother   . Stroke Father   . Hyperlipidemia Father   . Hyperlipidemia Sister   . Hyperlipidemia Brother   . Hyperlipidemia Daughter   . Hypertension Son     Medications: Patient's Medications  New Prescriptions   LINAGLIPTIN (TRADJENTA) 5 MG TABS TABLET    Take 1 tablet (5 mg total) by mouth daily. With largest meal  Previous Medications   AMIODARONE (PACERONE) 200 MG TABLET    Take 200 mg by mouth 2 (two) times daily.   ASPIRIN 81 MG TABLET    Take 81 mg by mouth 2 (two) times daily.    DULOXETINE (CYMBALTA) 30 MG CAPSULE    Take 30 mg by mouth daily.   FENOFIBRATE MICRONIZED (LOFIBRA) 200 MG CAPSULE    Take 200 mg by mouth daily before breakfast.   FINASTERIDE (PROSCAR) 5 MG TABLET    Take 5 mg by mouth daily.   FUROSEMIDE (LASIX) 40 MG TABLET    Take 40 mg by mouth daily.   GABAPENTIN (NEURONTIN) 600 MG TABLET     Take 600 mg by mouth 3 (three) times daily.    GLUCOSAMINE-CHONDROIT-VIT C-MN (GLUCOSAMINE CHONDR 1500 COMPLX) CAPS    Take 1 capsule by mouth daily.   HYALURONATE SODIUM (RADIAPLEXRX) GEL    Apply 1 application topically 2 (two) times daily.   INSULIN ASPART (NOVOLOG) 100 UNIT/ML INJECTION    Inject 5 Units into the skin 3 (three) times daily with meals.   INSULIN GLARGINE (LANTUS) 100 UNIT/ML INJECTION    Inject 0.22 mLs (22 Units total) into the skin at bedtime.   METOPROLOL TARTRATE (LOPRESSOR) 25 MG TABLET    Take 0.5 tablets (12.5 mg total) by mouth 2 (two) times daily.   OXYCODONE-ACETAMINOPHEN (PERCOCET/ROXICET) 5-325 MG PER TABLET    Take one tablet by mouth every 4 hours as needed for pain   POTASSIUM CHLORIDE (K-DUR) 10 MEQ TABLET    Take 20 mEq by mouth daily.   PROCHLORPERAZINE (COMPAZINE) 5 MG TABLET    Take 1 tablet (5 mg total) by mouth every 6 (six) hours as needed for nausea or vomiting.   ROSUVASTATIN (CRESTOR) 10 MG TABLET    Take 10 mg by mouth at bedtime.    SUCRALFATE (CARAFATE) 1 G TABLET    Take 1 tablet (1 g total) by mouth 4 (four) times daily.   TAMSULOSIN HCL (FLOMAX) 0.4 MG CAPS    Take 0.4 mg by mouth daily.  Modified Medications   No medications on file  Discontinued Medications   DILTIAZEM (CARDIZEM CD) 180 MG 24 HR CAPSULE    Take 180 mg by mouth daily.   LEVOFLOXACIN (LEVAQUIN) 750 MG TABLET    Take 750 mg by mouth daily.   PIOGLITAZONE (ACTOS) 30 MG TABLET    Take 30 mg by mouth daily.     Physical Exam: Filed Vitals:   07/12/14 1044  BP: 126/76  Pulse: 84  Temp: 97.6 F (36.4 C)  Resp: 18  Height: 5\' 11"  (1.803 m)  Weight: 213 lb (96.616 kg)  SpO2: 98%  Physical Exam  Constitutional: No distress.  Cardiovascular: Normal rate, regular rhythm, normal heart sounds and intact distal pulses.   Bilateral 2+ pitting edema  Pulmonary/Chest: He has wheezes. He has rales.  Increased effort to breathe  Abdominal: Soft. Bowel sounds are normal. He  exhibits no distension and no mass. There is no tenderness.  Musculoskeletal: Normal range of motion.  Neurological: He is alert.  Skin: Skin is warm and dry.    Labs reviewed: Basic Metabolic Panel:  Recent Labs  03/21/14 1456 03/21/14 1835  04/14/14 0525 04/15/14 0525 04/16/14 0508  05/13/14 2240  07/02/14 0815 07/08/14 0525 07/11/14 1354  NA 137  --   < >  132* 128* 133*  < > 136*  < > 135 137 130*  K 3.9  --   < > 4.3 4.2 4.6  < > 3.5*  < > 3.1* 4.1 4.3  CL 99  --   < > 92* 89* 94*  --  95*  --  96 96  --   CO2 25  --   < > 30 28 28   < > 25  < > 31 35* 23  GLUCOSE 52*  --   < > 252* 249* 257*  < > 223*  < > 82 80 60*  BUN 14  --   < > 19 19 19   < > 11  < > 18 17 44.1*  CREATININE 1.07  --   < > 0.75 0.69 0.76  < > 0.71  < > 1.13 1.10 1.6*  CALCIUM 10.1  --   < > 9.1 9.0 9.8  < > 8.2*  < > 8.2* 9.2 9.1  MG  --   --   < > 1.5 1.3* 1.4*  --   --   --   --   --   --   TSH  --  0.801  --   --   --   --   --   --   --   --   --   --   < > = values in this interval not displayed. Liver Function Tests:  Recent Labs  05/16/14 0934 05/23/14 1015 07/08/14 0525  AST 26 21 27   ALT 21 22 45  ALKPHOS 49 58 46  BILITOT 0.94 0.68 0.7  PROT 6.7 6.0* 5.7*  ALBUMIN 3.1* 2.8* 2.9*   No results found for this basename: LIPASE, AMYLASE,  in the last 8760 hours No results found for this basename: AMMONIA,  in the last 8760 hours CBC:  Recent Labs  06/21/14 1037 07/08/14 0525 07/09/14 0740  WBC 4.6 5.2 6.7  NEUTROABS 3.1 3.2 4.7  HGB 9.4* 9.6* 10.0*  HCT 29.4* 29.8* 31.5*  MCV 93.3 96.0 94.9  PLT 162 123* 142*   Lipid Panel:  Recent Labs  04/13/14 0315  CHOL 126  HDL 72  LDLCALC 23  TRIG 157*  CHOLHDL 1.8   Lab Results  Component Value Date   HGBA1C 9.3* 04/13/2014    Past Procedures: CXR 07/08/14:  CHF with left basilar airspace disease with small left pleural effusion  Assessment/Plan 1. Acute on chronic diastolic CHF (congestive heart failure), NYHA class  2 d/c pioglitazone (could be contributing to his chf) Increase lasix to 40mg  po bid for 3 days and kcl 37meq to bid for 3 days then return to daily dosing of both F/u bmp in 3 days Foley is in place so output can be monitored Cont O2 at 3L [place], but can increase if sats less than 90% - potassium chloride 20 MEQ TBCR; Take 40 mEq by mouth daily.  Dispense: 30 tablet; Refill: 3  2. Type 2 diabetes mellitus with neurological manifestations, uncontrolled d/c pioglitazone (could be contributing to his chf) Start tradjenta in place of pioglitazone May need to adjust lantus and meal coverage if tradjenta does not cover him well enough - linagliptin (TRADJENTA) 5 MG TABS tablet; Take 1 tablet (5 mg total) by mouth daily. With largest meal  Dispense: 30 tablet; Refill: 3  3. Malignant neoplasm of upper lobe of left lung Dr. Gearldine Shown note from today reviewed:  CT chest 03/14/2014 consistent with a left upper lobe  mass, left hilar lymphadenopathy, and left upper lobe atelectasis, staging PET scan pending.   Initiation of chest radiation 04/05/2014. Radiation completed 05/30/2014.   Initiation of weekly Taxol/carboplatin chemotherapy 04/18/2014. Final weekly Taxol/carboplatin given 05/30/2014.  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 Benay Spice 9/30  Labs/tests ordered:  bmp in 3 days after completes extra diuretic therapy--if wt not trending down, dyspnea worsening, renal function worsening, may need to return to hospital

## 2014-07-12 NOTE — Progress Notes (Signed)
[facility] [facility] OFFICE PROGRESS NOTE   Diagnosis: Lung cancer  INTERVAL HISTORY:   He returns as scheduled. He reports hoarseness for the past 2 weeks. Stable dyspnea. He is maintained on a 3 L nasal cannula. He has noted increased leg swelling. [name] is now living in a nursing facility. A Foley catheter is in place.  Objective:  Vital signs in last 24 hours:  Blood pressure 101/54, pulse 90, temperature 97.5 F (36.4 C), temperature source Oral, resp. rate 18, height 5\' 11"  (1.803 m), weight 220 lb 3.2 oz (99.882 kg).    HEENT: No thrush Lymphatics: No cervical, supraclavicular, axillary, or inguinal nodes Resp: Decreased breath sounds at the right lower chest, no respiratory distress, good air movement bilaterally Cardio: Regular rate and rhythm GI: No hepatomegaly Vascular: 2+ pitting edema below the knee bilaterally   Portacath/PICC-without erythema  Lab Results:  Lab Results  Component Value Date   WBC 6.7 07/09/2014   HGB 10.0* 07/09/2014   HCT 31.5* 07/09/2014   MCV 94.9 07/09/2014   PLT 142* 07/09/2014   NEUTROABS 4.7 07/09/2014   BUN 44.1, creatinine 1.6 on 07/11/2014   Imaging:  Ct Chest W Contrast  07/11/2014   CLINICAL DATA:  Followup lung cancer.  EXAM: CT CHEST WITH CONTRAST  TECHNIQUE: Multidetector CT imaging of the chest was performed during intravenous contrast administration.  CONTRAST:  43mL OMNIPAQUE IOHEXOL 300 MG/ML  SOLN  COMPARISON:  03/14/2014  FINDINGS: Mediastinum: There is moderate cardiac enlargement. Calcified atherosclerotic disease involves the thoracic aorta as well as the left main coronary artery, LAD, left circumflex and RCA coronary arteries. No pericardial effusions. No right paratracheal, sub- carinal or right hilar adenopathy identified.  Lungs/Pleura: There is a moderate left pleural effusion which is new from previous exam. Small right pleural effusion is also new from the previous study. Loculated fluid is identified  along the oblique fissure of the right lung. The left upper lobe paramediastinal mass measures 3.6 x 8.8 x 8.8 cm. Previously this measured 6.6 x 10.2 x 9.0 cm.  Upper Abdomen: The visualized portions of the adrenal glands are normal. The visualized portions of the spleen and pancreas are also unremarkable. There is a stone within the gallbladder which measures 1.7 cm. No focal liver abnormality identified.  Musculoskeletal: Review of the visualized bony structures shows no aggressive lytic or sclerotic bone lesion.  IMPRESSION: 1. Interval decrease in size of left upper lobe perihilar/perimediastinal mass compatible with response to therapy.  2. Bilateral pleural effusions, left greater than right. New from previous exam.  3. Atherosclerotic disease including multi vessel coronary artery calcifications.   Electronically Signed   By: Kerby Moors M.D.   On: 07/11/2014 17:16    Medications: I have reviewed the patient's current medications.  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  Initiation of chest radiation 04/05/2014. Radiation completed 05/30/2014.  Initiation of weekly Taxol/carboplatin chemotherapy 04/18/2014. Final weekly Taxol/carboplatin given 05/30/2014. Restaging CT 07/11/2014 with a decrease in the left upper lobe mass, new bilateral pleural effusions 2. Admission 04/10/2014 with acute CHF and acute coronary syndrome, status post a cardiac catheterization 04/10/2014, clinical improvement with medical therapy.  3. COPD  4. Chronic renal failure  5. Diabetes  6. Neuropathy  7. History of non-Hodgkin's lymphoma  8. Atrial fibrillation.  9. Status post evaluation in the emergency  department 05/13/2014 for chest pain. Question radiation esophagitis.  10. Oral candidiasis. He completed a 5 day  course of Diflucan.  11. History of thrombocytopenia secondary to chemotherapy. The carboplatin was dose reduced on 05/30/2014.  12. Hoarseness-potentially related to recurrent laryngeal nerve involvement with tumor  Disposition:  [name] has completed concurrent chemotherapy and radiation for treatment of non-small cell lung cancer. The restaging CT reveals a decrease in the left upper lung mass. I am concerned the hoarseness is related to tumor involving the recurrent laryngeal nerve. We will make an ENT referral if the hoarseness persists. He has developed bilateral pleural effusions and increased lower extremity edema. This is most likely related to CHF in the setting of COPD and chronic renal failure. We repeated a chemistry panel today. I recommend an evaluation of his volume status by the nursing home physician or Dr. Joylene Draft.  He will return for an office visit here 07/27/2014.  Betsy Coder, MD  07/12/2014  1:14 PM

## 2014-07-12 NOTE — Telephone Encounter (Signed)
Pt confirmed labs/ov per 09/15 POF, gave pt AVS..Marland KitchenKJ

## 2014-07-14 ENCOUNTER — Other Ambulatory Visit: Payer: Self-pay | Admitting: Internal Medicine

## 2014-07-14 ENCOUNTER — Non-Acute Institutional Stay (SKILLED_NURSING_FACILITY): Payer: Medicare Other | Admitting: Internal Medicine

## 2014-07-14 DIAGNOSIS — N481 Balanitis: Secondary | ICD-10-CM

## 2014-07-14 DIAGNOSIS — E1149 Type 2 diabetes mellitus with other diabetic neurological complication: Secondary | ICD-10-CM

## 2014-07-14 DIAGNOSIS — N476 Balanoposthitis: Secondary | ICD-10-CM

## 2014-07-14 NOTE — Progress Notes (Signed)
Patient ID: [name] [name], male   DOB: 08/16/1933, 80 y.o.   MRN: [id number]    Facility: [facility]  Chief Complaint  Patient presents with  . Acute Visit    swollen penis, penile drainage   No Known Allergies  HPI Patient is seen today with complaints of his penis being swollen since Tuesday 07/12/14. He complaints of pain and discomfort. He has been on chronic foley catheter. Today the staff noticed the swelling and redness while cleaning him and removed the foley. He has been able to urinate x 3 since removal of foley on his own. Denies any dysuria but has pain in his penile area. Denies groin pain  ROS No fever or chills No blood in urine No redness or swelling elsewhere Denies chest pain or dyspnea Has history of DM, CAD, lung cancer, copd among others   Past Medical History  Diagnosis Date  . Peripheral arterial disease     s/p Ao Bifem Bypass  . Diabetes mellitus without complication   . CAD (coronary artery disease)     By  CT Scan  . Hyperlipidemia   . Hypertension   . Non Hodgkin's lymphoma 2000, 2010  . Peripheral neuropathy   . COPD (chronic obstructive pulmonary disease)   . History of lower GI bleeding   . Substance abuse   . Lung cancer, upper lobe     left lung  . AAA (abdominal aortic aneurysm)   . MI (myocardial infarction) 04/10/14   Medication reviewed. See [facility]  Physical exam BP 111/52  Pulse 58  Temp(Src) 98 F (36.7 C)  Resp 18  Ht 5\' 11"  (1.803 m)  Wt 217 lb (98.431 kg)  BMI 30.28 kg/m2  SpO2 97%  General- elderly male in no acute distress Lungs- poor air entry, no wheeze or rhonchi, on o2 Genitalia-  Erythematous penile shaft and scrotal area, penile foreskin is retracted and swollen with glans penis is also swollen, moisture present but no skin breakdown, purulent penile discharge, collected swab and sent for culture, no inguinal lymphadenopathy, no testicular pain Musculoskeletal- can move all 4 extremities, 1+  bilateral leg edema  Labs 07/08/14 wbc 5.2, hb 9.6, hct 29.8, na 137, k 4.1, bun 17, cr 1.10, alb 2.9 07/09/14 wbc 6.7, hb 10, hct 31.5, plt 70  Lab Results  Component Value Date   HGBA1C 9.3* 04/13/2014   Assessment/plan  Balanitis inflamed and swollen glans penis with purulent penile drainage suggestive of balanitis Treat with flagyl 500 mg bid for 10 days and levofloxacin 750 mg daily for 10 days to provide both aerobic and anaerobic coverage along with pseudomonas coverage given that he has diabetes F/u on culture report Monitor for urinary symptoms Send u/a with culture  DM type 2 He has hx of DM Continue tradjenta and lantus 22 u with SSI novolog Check a1c to assess for need of medication adjustment

## 2014-07-15 ENCOUNTER — Other Ambulatory Visit: Payer: Self-pay | Admitting: Internal Medicine

## 2014-07-15 LAB — BASIC METABOLIC PANEL
BUN: 19 mg/dL (ref 6–23)
CO2: 27 mEq/L (ref 19–32)
Calcium: 8.7 mg/dL (ref 8.4–10.5)
Chloride: 93 mEq/L — ABNORMAL LOW (ref 96–112)
Creat: 1 mg/dL (ref 0.50–1.35)
Glucose, Bld: 126 mg/dL — ABNORMAL HIGH (ref 70–99)
Potassium: 3.7 mEq/L (ref 3.5–5.3)
Sodium: 133 mEq/L — ABNORMAL LOW (ref 135–145)

## 2014-07-15 LAB — HEMOGLOBIN A1C
Hgb A1c MFr Bld: 7.7 % — ABNORMAL HIGH (ref <5.7)
Mean Plasma Glucose: 174 mg/dL — ABNORMAL HIGH (ref <117)

## 2014-07-17 LAB — CULTURE, ROUTINE-ABSCESS: GRAM STAIN: NONE SEEN

## 2014-07-23 ENCOUNTER — Encounter (HOSPITAL_COMMUNITY): Payer: Self-pay | Admitting: Emergency Medicine

## 2014-07-23 ENCOUNTER — Emergency Department (HOSPITAL_COMMUNITY): Payer: Medicare Other

## 2014-07-23 ENCOUNTER — Inpatient Hospital Stay (HOSPITAL_COMMUNITY)
Admission: EM | Admit: 2014-07-23 | Discharge: 2014-08-02 | DRG: 291 | Disposition: A | Discharge status: Skilled Nursing Facility | Payer: Medicare Other | Attending: Internal Medicine | Admitting: Internal Medicine

## 2014-07-23 DIAGNOSIS — Z85118 Personal history of other malignant neoplasm of bronchus and lung: Secondary | ICD-10-CM

## 2014-07-23 DIAGNOSIS — F329 Major depressive disorder, single episode, unspecified: Secondary | ICD-10-CM | POA: Diagnosis present

## 2014-07-23 DIAGNOSIS — Z823 Family history of stroke: Secondary | ICD-10-CM | POA: Diagnosis not present

## 2014-07-23 DIAGNOSIS — Z794 Long term (current) use of insulin: Secondary | ICD-10-CM

## 2014-07-23 DIAGNOSIS — I2510 Atherosclerotic heart disease of native coronary artery without angina pectoris: Secondary | ICD-10-CM | POA: Diagnosis present

## 2014-07-23 DIAGNOSIS — I10 Essential (primary) hypertension: Secondary | ICD-10-CM | POA: Diagnosis present

## 2014-07-23 DIAGNOSIS — K219 Gastro-esophageal reflux disease without esophagitis: Secondary | ICD-10-CM | POA: Diagnosis present

## 2014-07-23 DIAGNOSIS — Z923 Personal history of irradiation: Secondary | ICD-10-CM

## 2014-07-23 DIAGNOSIS — Z87891 Personal history of nicotine dependence: Secondary | ICD-10-CM | POA: Diagnosis not present

## 2014-07-23 DIAGNOSIS — J9620 Acute and chronic respiratory failure, unspecified whether with hypoxia or hypercapnia: Secondary | ICD-10-CM

## 2014-07-23 DIAGNOSIS — J9621 Acute and chronic respiratory failure with hypoxia: Secondary | ICD-10-CM | POA: Diagnosis present

## 2014-07-23 DIAGNOSIS — Z8249 Family history of ischemic heart disease and other diseases of the circulatory system: Secondary | ICD-10-CM | POA: Diagnosis not present

## 2014-07-23 DIAGNOSIS — N400 Enlarged prostate without lower urinary tract symptoms: Secondary | ICD-10-CM | POA: Diagnosis present

## 2014-07-23 DIAGNOSIS — Z8572 Personal history of non-Hodgkin lymphomas: Secondary | ICD-10-CM

## 2014-07-23 DIAGNOSIS — Z79899 Other long term (current) drug therapy: Secondary | ICD-10-CM

## 2014-07-23 DIAGNOSIS — J449 Chronic obstructive pulmonary disease, unspecified: Secondary | ICD-10-CM | POA: Diagnosis present

## 2014-07-23 DIAGNOSIS — I739 Peripheral vascular disease, unspecified: Secondary | ICD-10-CM | POA: Diagnosis present

## 2014-07-23 DIAGNOSIS — R918 Other nonspecific abnormal finding of lung field: Secondary | ICD-10-CM

## 2014-07-23 DIAGNOSIS — I252 Old myocardial infarction: Secondary | ICD-10-CM | POA: Diagnosis not present

## 2014-07-23 DIAGNOSIS — D6959 Other secondary thrombocytopenia: Secondary | ICD-10-CM | POA: Diagnosis present

## 2014-07-23 DIAGNOSIS — T451X5A Adverse effect of antineoplastic and immunosuppressive drugs, initial encounter: Secondary | ICD-10-CM | POA: Diagnosis present

## 2014-07-23 DIAGNOSIS — E1142 Type 2 diabetes mellitus with diabetic polyneuropathy: Secondary | ICD-10-CM | POA: Diagnosis present

## 2014-07-23 DIAGNOSIS — R0602 Shortness of breath: Secondary | ICD-10-CM | POA: Diagnosis present

## 2014-07-23 DIAGNOSIS — I509 Heart failure, unspecified: Secondary | ICD-10-CM

## 2014-07-23 DIAGNOSIS — IMO0001 Reserved for inherently not codable concepts without codable children: Secondary | ICD-10-CM

## 2014-07-23 DIAGNOSIS — C3412 Malignant neoplasm of upper lobe, left bronchus or lung: Secondary | ICD-10-CM

## 2014-07-23 DIAGNOSIS — I5043 Acute on chronic combined systolic (congestive) and diastolic (congestive) heart failure: Principal | ICD-10-CM | POA: Diagnosis present

## 2014-07-23 DIAGNOSIS — R0902 Hypoxemia: Secondary | ICD-10-CM

## 2014-07-23 DIAGNOSIS — B370 Candidal stomatitis: Secondary | ICD-10-CM | POA: Diagnosis not present

## 2014-07-23 DIAGNOSIS — J9611 Chronic respiratory failure with hypoxia: Secondary | ICD-10-CM

## 2014-07-23 DIAGNOSIS — F32A Depression, unspecified: Secondary | ICD-10-CM

## 2014-07-23 DIAGNOSIS — J90 Pleural effusion, not elsewhere classified: Secondary | ICD-10-CM

## 2014-07-23 DIAGNOSIS — I480 Paroxysmal atrial fibrillation: Secondary | ICD-10-CM | POA: Diagnosis present

## 2014-07-23 DIAGNOSIS — C3490 Malignant neoplasm of unspecified part of unspecified bronchus or lung: Secondary | ICD-10-CM | POA: Diagnosis present

## 2014-07-23 DIAGNOSIS — E1165 Type 2 diabetes mellitus with hyperglycemia: Secondary | ICD-10-CM

## 2014-07-23 DIAGNOSIS — Z7982 Long term (current) use of aspirin: Secondary | ICD-10-CM | POA: Diagnosis not present

## 2014-07-23 DIAGNOSIS — J9610 Chronic respiratory failure, unspecified whether with hypoxia or hypercapnia: Secondary | ICD-10-CM | POA: Diagnosis present

## 2014-07-23 DIAGNOSIS — J439 Emphysema, unspecified: Secondary | ICD-10-CM

## 2014-07-23 DIAGNOSIS — E785 Hyperlipidemia, unspecified: Secondary | ICD-10-CM | POA: Diagnosis present

## 2014-07-23 DIAGNOSIS — E1149 Type 2 diabetes mellitus with other diabetic neurological complication: Secondary | ICD-10-CM | POA: Diagnosis present

## 2014-07-23 DIAGNOSIS — I5033 Acute on chronic diastolic (congestive) heart failure: Secondary | ICD-10-CM | POA: Diagnosis present

## 2014-07-23 DIAGNOSIS — I350 Nonrheumatic aortic (valve) stenosis: Secondary | ICD-10-CM

## 2014-07-23 LAB — CBC WITH DIFFERENTIAL/PLATELET
BASOS PCT: 1 % (ref 0–1)
Basophils Absolute: 0 10*3/uL (ref 0.0–0.1)
EOS PCT: 1 % (ref 0–5)
Eosinophils Absolute: 0 10*3/uL (ref 0.0–0.7)
HCT: 29.7 % — ABNORMAL LOW (ref 39.0–52.0)
HEMOGLOBIN: 9.5 g/dL — AB (ref 13.0–17.0)
Lymphocytes Relative: 15 % (ref 12–46)
Lymphs Abs: 0.5 10*3/uL — ABNORMAL LOW (ref 0.7–4.0)
MCH: 29.7 pg (ref 26.0–34.0)
MCHC: 32 g/dL (ref 30.0–36.0)
MCV: 92.8 fL (ref 78.0–100.0)
MONO ABS: 0.8 10*3/uL (ref 0.1–1.0)
Monocytes Relative: 22 % — ABNORMAL HIGH (ref 3–12)
Neutro Abs: 2.2 10*3/uL (ref 1.7–7.7)
Neutrophils Relative %: 61 % (ref 43–77)
Platelets: 146 10*3/uL — ABNORMAL LOW (ref 150–400)
RBC: 3.2 MIL/uL — ABNORMAL LOW (ref 4.22–5.81)
RDW: 16.9 % — ABNORMAL HIGH (ref 11.5–15.5)
WBC: 3.5 10*3/uL — ABNORMAL LOW (ref 4.0–10.5)

## 2014-07-23 LAB — COMPREHENSIVE METABOLIC PANEL
ALBUMIN: 2.6 g/dL — AB (ref 3.5–5.2)
ALT: 19 U/L (ref 0–53)
ANION GAP: 10 (ref 5–15)
AST: 23 U/L (ref 0–37)
Alkaline Phosphatase: 39 U/L (ref 39–117)
BUN: 13 mg/dL (ref 6–23)
CO2: 31 mEq/L (ref 19–32)
CREATININE: 0.9 mg/dL (ref 0.50–1.35)
Calcium: 8.6 mg/dL (ref 8.4–10.5)
Chloride: 95 mEq/L — ABNORMAL LOW (ref 96–112)
GFR calc Af Amer: >90 mL/min (ref >90)
GFR calc non Af Amer: 79 mL/min — ABNORMAL LOW (ref >90)
Glucose, Bld: 136 mg/dL — ABNORMAL HIGH (ref 70–99)
Potassium: 3.8 mEq/L (ref 3.7–5.3)
Sodium: 136 mEq/L — ABNORMAL LOW (ref 137–147)
TOTAL PROTEIN: 5.8 g/dL — AB (ref 6.0–8.3)
Total Bilirubin: 0.7 mg/dL (ref 0.3–1.2)

## 2014-07-23 LAB — GLUCOSE, CAPILLARY: GLUCOSE-CAPILLARY: 210 mg/dL — AB (ref 70–99)

## 2014-07-23 LAB — PRO B NATRIURETIC PEPTIDE: Pro B Natriuretic peptide (BNP): 21437 pg/mL — ABNORMAL HIGH (ref 0–450)

## 2014-07-23 LAB — MRSA PCR SCREENING: MRSA BY PCR: POSITIVE — AB

## 2014-07-23 LAB — TROPONIN I: Troponin I: <0.3 ng/mL (ref <0.30)

## 2014-07-23 MED ORDER — LEVALBUTEROL HCL 0.63 MG/3ML IN NEBU: 0.6300 mg | INHALATION_SOLUTION | Freq: Four times a day (QID) | RESPIRATORY_TRACT | Status: DC | PRN

## 2014-07-23 MED ORDER — LINAGLIPTIN 5 MG PO TABS
5.0000 mg | ORAL_TABLET | Freq: Every day | ORAL | Status: DC
  Administered 2014-07-23 – 2014-07-31 (×9): 5 mg via ORAL
  Filled 2014-07-23 (×11): qty 1

## 2014-07-23 MED ORDER — MUPIROCIN 2 % EX OINT
1.0000 "application " | TOPICAL_OINTMENT | Freq: Two times a day (BID) | CUTANEOUS | Status: AC
  Administered 2014-07-24 – 2014-07-27 (×8): 1 via NASAL
  Filled 2014-07-23: qty 22

## 2014-07-23 MED ORDER — ATORVASTATIN CALCIUM 40 MG PO TABS
40.0000 mg | ORAL_TABLET | Freq: Every day | ORAL | Status: DC
  Administered 2014-07-23 – 2014-08-02 (×11): 40 mg via ORAL
  Filled 2014-07-23 (×11): qty 1

## 2014-07-23 MED ORDER — SODIUM CHLORIDE 0.9 % IJ SOLN
10.0000 mL | INTRAMUSCULAR | Status: DC | PRN
  Administered 2014-07-24 – 2014-07-28 (×6): 10 mL

## 2014-07-23 MED ORDER — AMIODARONE HCL 200 MG PO TABS
200.0000 mg | ORAL_TABLET | Freq: Two times a day (BID) | ORAL | Status: DC
  Administered 2014-07-23 – 2014-08-02 (×21): 200 mg via ORAL
  Filled 2014-07-23 (×22): qty 1

## 2014-07-23 MED ORDER — FUROSEMIDE 10 MG/ML IJ SOLN
40.0000 mg | Freq: Two times a day (BID) | INTRAMUSCULAR | Status: DC
  Administered 2014-07-23 – 2014-07-26 (×6): 40 mg via INTRAVENOUS
  Filled 2014-07-23 (×6): qty 4

## 2014-07-23 MED ORDER — ONDANSETRON HCL 4 MG/2ML IJ SOLN
4.0000 mg | Freq: Four times a day (QID) | INTRAMUSCULAR | Status: DC | PRN
Start: 2014-07-23 — End: 2014-08-02

## 2014-07-23 MED ORDER — SUCRALFATE 1 G PO TABS
1.0000 g | ORAL_TABLET | Freq: Four times a day (QID) | ORAL | Status: DC
  Administered 2014-07-23 – 2014-08-02 (×41): 1 g via ORAL
  Filled 2014-07-23 (×44): qty 1

## 2014-07-23 MED ORDER — ALBUTEROL SULFATE (2.5 MG/3ML) 0.083% IN NEBU
5.0000 mg | INHALATION_SOLUTION | Freq: Once | RESPIRATORY_TRACT | Status: AC
  Administered 2014-07-23: 5 mg via RESPIRATORY_TRACT
  Filled 2014-07-23: qty 6

## 2014-07-23 MED ORDER — ENOXAPARIN SODIUM 40 MG/0.4ML ~~LOC~~ SOLN
40.0000 mg | SUBCUTANEOUS | Status: DC
  Administered 2014-07-23 – 2014-08-02 (×11): 40 mg via SUBCUTANEOUS
  Filled 2014-07-23 (×11): qty 0.4

## 2014-07-23 MED ORDER — OXYCODONE-ACETAMINOPHEN 5-325 MG PO TABS
1.0000 | ORAL_TABLET | Freq: Four times a day (QID) | ORAL | Status: DC | PRN
  Filled 2014-07-23: qty 2

## 2014-07-23 MED ORDER — MAGNESIUM HYDROXIDE 400 MG/5ML PO SUSP: 30.0000 mL | Freq: Every day | ORAL | Status: DC | PRN

## 2014-07-23 MED ORDER — ALUM & MAG HYDROXIDE-SIMETH 200-200-20 MG/5ML PO SUSP
30.0000 mL | Freq: Four times a day (QID) | ORAL | Status: DC | PRN
  Administered 2014-07-23: 30 mL via ORAL
  Filled 2014-07-23: qty 30

## 2014-07-23 MED ORDER — INSULIN GLARGINE 100 UNIT/ML ~~LOC~~ SOLN
22.0000 [IU] | Freq: Every day | SUBCUTANEOUS | Status: DC
  Administered 2014-07-23 – 2014-08-01 (×10): 22 [IU] via SUBCUTANEOUS
  Filled 2014-07-23 (×11): qty 0.22

## 2014-07-23 MED ORDER — CITALOPRAM HYDROBROMIDE 20 MG PO TABS
20.0000 mg | ORAL_TABLET | Freq: Every day | ORAL | Status: DC
  Administered 2014-07-23 – 2014-08-02 (×11): 20 mg via ORAL
  Filled 2014-07-23 (×11): qty 1

## 2014-07-23 MED ORDER — CHLORHEXIDINE GLUCONATE CLOTH 2 % EX PADS
6.0000 | MEDICATED_PAD | Freq: Every day | CUTANEOUS | Status: AC
  Administered 2014-07-24 – 2014-07-28 (×4): 6 via TOPICAL

## 2014-07-23 MED ORDER — ASPIRIN 81 MG PO TABS
81.0000 mg | ORAL_TABLET | Freq: Two times a day (BID) | ORAL | Status: DC
  Administered 2014-07-23: 81 mg via ORAL
  Filled 2014-07-23 (×3): qty 1

## 2014-07-23 MED ORDER — SODIUM CHLORIDE 0.9 % IJ SOLN
3.0000 mL | Freq: Two times a day (BID) | INTRAMUSCULAR | Status: DC
  Administered 2014-07-23 – 2014-07-29 (×8): 3 mL via INTRAVENOUS
  Administered 2014-07-30: 21:00:00 via INTRAVENOUS

## 2014-07-23 MED ORDER — POTASSIUM CHLORIDE ER 10 MEQ PO TBCR
40.0000 meq | EXTENDED_RELEASE_TABLET | Freq: Every day | ORAL | Status: DC
  Administered 2014-07-23 – 2014-08-02 (×11): 40 meq via ORAL
  Filled 2014-07-23 (×11): qty 4

## 2014-07-23 MED ORDER — ACETAMINOPHEN 325 MG PO TABS
650.0000 mg | ORAL_TABLET | Freq: Four times a day (QID) | ORAL | Status: DC | PRN
  Administered 2014-07-27: 650 mg via ORAL
  Filled 2014-07-23: qty 2

## 2014-07-23 MED ORDER — FUROSEMIDE 10 MG/ML IJ SOLN
40.0000 mg | Freq: Once | INTRAMUSCULAR | Status: AC
  Administered 2014-07-23: 40 mg via INTRAVENOUS
  Filled 2014-07-23: qty 4

## 2014-07-23 MED ORDER — INSULIN ASPART 100 UNIT/ML ~~LOC~~ SOLN
5.0000 [IU] | Freq: Two times a day (BID) | SUBCUTANEOUS | Status: DC
  Administered 2014-07-23 – 2014-07-24 (×3): 5 [IU] via SUBCUTANEOUS

## 2014-07-23 MED ORDER — TAMSULOSIN HCL 0.4 MG PO CAPS
0.4000 mg | ORAL_CAPSULE | Freq: Every day | ORAL | Status: DC
  Administered 2014-07-23: 0.4 mg via ORAL
  Filled 2014-07-23 (×2): qty 1

## 2014-07-23 MED ORDER — FINASTERIDE 5 MG PO TABS
5.0000 mg | ORAL_TABLET | Freq: Every day | ORAL | Status: DC
  Administered 2014-07-23 – 2014-08-02 (×11): 5 mg via ORAL
  Filled 2014-07-23 (×11): qty 1

## 2014-07-23 MED ORDER — ACETAMINOPHEN 650 MG RE SUPP: 650.0000 mg | Freq: Four times a day (QID) | RECTAL | Status: DC | PRN

## 2014-07-23 MED ORDER — ASPIRIN 81 MG PO CHEW
81.0000 mg | CHEWABLE_TABLET | Freq: Two times a day (BID) | ORAL | Status: DC
  Administered 2014-07-23 – 2014-08-02 (×20): 81 mg via ORAL
  Filled 2014-07-23 (×19): qty 1

## 2014-07-23 MED ORDER — GABAPENTIN 600 MG PO TABS
600.0000 mg | ORAL_TABLET | Freq: Two times a day (BID) | ORAL | Status: DC
  Administered 2014-07-23 – 2014-08-02 (×21): 600 mg via ORAL
  Filled 2014-07-23 (×22): qty 1

## 2014-07-23 MED ORDER — DOCUSATE SODIUM 100 MG PO CAPS
100.0000 mg | ORAL_CAPSULE | Freq: Two times a day (BID) | ORAL | Status: DC
  Administered 2014-07-23 – 2014-08-01 (×14): 100 mg via ORAL
  Filled 2014-07-23 (×22): qty 1

## 2014-07-23 MED ORDER — ONDANSETRON HCL 4 MG PO TABS: 4.0000 mg | ORAL_TABLET | Freq: Four times a day (QID) | ORAL | Status: DC | PRN

## 2014-07-23 MED ORDER — CETYLPYRIDINIUM CHLORIDE 0.05 % MT LIQD
7.0000 mL | Freq: Two times a day (BID) | OROMUCOSAL | Status: DC
  Administered 2014-07-24 – 2014-08-02 (×19): 7 mL via OROMUCOSAL

## 2014-07-23 NOTE — H&P (Signed)
[facility] History and Physical  [name] [name]:[id number] DOB: 04-25-1933 DOA: 07/23/2014  Referring physician: EDP PCP: [name], MD   Chief Complaint: shortness of breath  HPI: [name] [name] is a 80 y.o. male  With h/o diastolic HF, lung cancer s/p chemoradiation, chronic respiratory failure presents to ED from SNF with dyspnea starting last night. Had recent hospitalization at [facility] for CHF exacerbation, then went to SNF at [facility]. Weight 224 lbs, up from 193 lbs in July.  CXR shows left pleural effusion. Present on CT scan earlier this month (with interval decrease in size of left upper lobe perihilar/perimediastinal mass compatible with response to therapy). proBNP 21,000 (1000 in July). Echo 6/15 EF 25-05%, grade 1 diastolic dysfunction. C/o worsening leg edema. Denies CP, palpitations.   Review of Systems:  Systems reviewed. As above. Otherwise negative.    Past Medical History  Diagnosis Date  . Peripheral arterial disease     s/p Ao Bifem Bypass  . Diabetes mellitus without complication   . CAD (coronary artery disease)     By  CT Scan  . Hyperlipidemia   . Hypertension   . Non Hodgkin's lymphoma 2000, 2010  . Peripheral neuropathy   . COPD (chronic obstructive pulmonary disease)   . History of lower GI bleeding   . Substance abuse   . Lung cancer, upper lobe     left lung  . AAA (abdominal aortic aneurysm)   . MI (myocardial infarction) 04/10/14   Past Surgical History  Procedure Laterality Date  . Fracture surgery  2009    Left ankle  . Pr vein bypass graft,aorto-fem-pop  2005    Right common femoral to BK popliteal BPG  . Pr vein bypass graft,aorto-fem-pop  01-27-05    Revision of Right fem-pop BPG  . Cardiac catheterization  1990's  . Video bronchoscopy Bilateral 03/14/2014    Procedure: VIDEO BRONCHOSCOPY WITH FLUORO;  Surgeon: Rigoberto Noel, MD;  Location: [facility];  Service: Cardiopulmonary;  Laterality: Bilateral;    . Nm myoview ltd  11/2013    Lexiscan.  EF 60%, small fixed inferoapical defect - ? artifact  . Transthoracic echocardiogram  11/2013    EF 60-65%, Gr 1 DD, Mod AS, Sever MAC - no MS.      Social History:  reports that he quit smoking about 10 years ago. His smoking use included Cigarettes. He has a 50 pack-year smoking history. He has never used smokeless tobacco. He reports that he does not drink alcohol or use illicit drugs.  No Known Allergies  Family History  Problem Relation Age of Onset  . Cancer Mother     Colon  . Heart attack Mother   . Stroke Father   . Hyperlipidemia Father   . Hyperlipidemia Sister   . Hyperlipidemia Brother   . Hyperlipidemia Daughter   . Hypertension Son      Prior to Admission medications   Medication Sig Start Date End Date Taking? Authorizing Provider  amiodarone (PACERONE) 200 MG tablet Take 200 mg by mouth 2 (two) times daily.   Yes Historical Provider, MD  aspirin 81 MG tablet Take 81 mg by mouth 2 (two) times daily.    Yes Historical Provider, MD  atorvastatin (LIPITOR) 40 MG tablet Take 40 mg by mouth daily.   Yes Historical Provider, MD  citalopram (CELEXA) 20 MG tablet Take 20 mg by mouth daily.   Yes Historical Provider, MD  finasteride (PROSCAR) 5 MG tablet Take  5 mg by mouth daily.   Yes Historical Provider, MD  furosemide (LASIX) 40 MG tablet Take 40 mg by mouth daily.   Yes Historical Provider, MD  gabapentin (NEURONTIN) 600 MG tablet Take 600 mg by mouth 2 (two) times daily.    Yes Historical Provider, MD  Glucosamine-Chondroit-Vit C-Mn (GLUCOSAMINE CHONDR 1500 COMPLX) CAPS Take 1 capsule by mouth daily.   Yes Historical Provider, MD  insulin aspart (NOVOLOG) 100 UNIT/ML injection Inject 5 Units into the skin 2 (two) times daily.   Yes Historical Provider, MD  insulin glargine (LANTUS) 100 UNIT/ML injection Inject 0.22 mLs (22 Units total) into the skin at bedtime. 04/16/14  Yes Costin Karlyne Greenspan, MD  linagliptin (TRADJENTA) 5 MG TABS  tablet Take 1 tablet (5 mg total) by mouth daily. With largest meal 07/12/14  Yes Tiffany L Reed, DO  metoprolol tartrate (LOPRESSOR) 25 MG tablet Take 0.5 tablets (12.5 mg total) by mouth 2 (two) times daily. 06/02/14  Yes Mihai Croitoru, MD  oxyCODONE-acetaminophen (PERCOCET/ROXICET) 5-325 MG per tablet Take one tablet by mouth every 4 hours as needed for pain 06/30/14  Yes Tiffany L Reed, DO  potassium chloride 20 MEQ TBCR Take 40 mEq by mouth daily. 07/12/14  Yes Tiffany L Reed, DO  sucralfate (CARAFATE) 1 G tablet Take 1 tablet (1 g total) by mouth 4 (four) times daily. 05/14/14  Yes Kalman Drape, MD  Tamsulosin HCl (FLOMAX) 0.4 MG CAPS Take 0.4 mg by mouth daily.   Yes Historical Provider, MD   Physical Exam: Filed Vitals:   07/23/14 1019 07/23/14 1100 07/23/14 1130 07/23/14 1200  BP: 102/63 98/71 98/58  93/44  Pulse: 82 85 87 105  Temp:      TempSrc:      Resp: 15 15 16 24   Height:      Weight:      SpO2: 99% 100% 98% 99%    Wt Readings from Last 3 Encounters:  07/23/14 101.606 kg (224 lb)  07/14/14 98.431 kg (217 lb)  07/12/14 99.882 kg (220 lb 3.2 oz)    BP 95/65  Pulse 89  Temp(Src) 97.3 F (36.3 C) (Oral)  Resp 20  Ht 5' 11.5" (1.816 m)  Wt 101.606 kg (224 lb)  BMI 30.81 kg/m2  SpO2 99%  General Appearance:    Alert, cooperative, no distress, appears stated age  Head:    Normocephalic, without obvious abnormality, atraumatic  Eyes:    PERRL, conjunctiva/corneas clear, EOM's intact,   Nose:   Nares normal, septum midline, mucosa normal, no drainage    or sinus tenderness  Throat:   Lips, mucosa, and tongue normal; teeth and gums normal  Neck:   Supple, JVD present. No carotid bruits, no LA  Back:     Symmetric, no curvature, ROM normal, no CVA tenderness  Lungs:     Diminished at bases. No WRR, dull to percussion L>R base  Chest Wall:    No tenderness or deformity   Heart:    Regular rate and rhythm, S1 and S2 normal, no murmur, rub   or gallop  Abdomen:     Soft,  non-tender, bowel sounds active   Genitalia:    No edema  Rectal:    deferred  Extremities:   3+ edema no CC. No ulcers  Pulses:   2+ and symmetric all extremities  Skin:   Skin color, texture, turgor normal, no rashes or lesions  Lymph nodes:   Cervical, supraclavicular, and axillary nodes normal  Neurologic:  CNII-XII intact, normal strength, sensation and reflexes    throughout             Psych: normal affect  Labs on Admission:  Basic Metabolic Panel:  Recent Labs Lab 07/23/14 0955  NA 136*  K 3.8  CL 95*  CO2 31  GLUCOSE 136*  BUN 13  CREATININE 0.90  CALCIUM 8.6   Liver Function Tests:  Recent Labs Lab 07/23/14 0955  AST 23  ALT 19  ALKPHOS 39  BILITOT 0.7  PROT 5.8*  ALBUMIN 2.6*   No results found for this basename: LIPASE, AMYLASE,  in the last 168 hours No results found for this basename: AMMONIA,  in the last 168 hours CBC:  Recent Labs Lab 07/23/14 0955  WBC 3.5*  NEUTROABS 2.2  HGB 9.5*  HCT 29.7*  MCV 92.8  PLT 146*   Cardiac Enzymes:  Recent Labs Lab 07/23/14 0955  TROPONINI <0.30    BNP (last 3 results)  Recent Labs  04/10/14 1145 05/13/14 2240 07/23/14 0955  PROBNP 5244.0* 1118.0* 21437.0*   CBG: No results found for this basename: GLUCAP,  in the last 168 hours  Radiological Exams on Admission: Dg Chest 2 View  07/23/2014   CLINICAL DATA:  Shortness of breath.  EXAM: CHEST  2 VIEW  COMPARISON:  CT 07/11/2014.  Chest x-ray  06/25/2014.  FINDINGS: Prior port catheter in good anatomic position. Mediastinum and stable. Heart size stable. Left upper lobe mass with left upper lobe atelectasis again noted. Left-sided pleural effusion again noted. Small right pleural effusion. No acute bony abnormality.  IMPRESSION: 1. Left upper lobe mass with left upper lobe atelectasis appears stable. 2. Prominent left pleural effusion.  Small right pleural effusion. 3. Power port catheter in stable position.   Electronically Signed   By:  Marcello Moores  Register   On: 07/23/2014 10:47    EKG: Sinus rhythm Atrial premature complex Nonspecific IVCD with LAD Consider anterior infarct  Assessment/Plan Principal Problem:   Acute on chronic diastolic congestive heart failure: IV lasix. Blood pressure borderline low, so hold coreg for now. Tele. Recent admisison to [place] for same. Will request records Active Problems: Left greater than right pleural effusion: may improve with diuresis. Hold off on thoracentesis for now. If remains despite adequate diuresis, may consider thoracentesis in a few days. Chronic respiratory failure: cont oxygen   DM (diabetes mellitus) type II controlled, neurological manifestation continue outpatient regimen   CAD   Squamous cell carcinoma lung: last CT shows response to treatment Hoarseness. Per Dr. Carin Hock last note, concern about recurrent laryngeal nerve involvement with tumor. Needs outpt referral to ENT   COPD (chronic obstructive pulmonary disease) stable Deconditioning: continue PT   Code Status: partial code: DNI Family Communication: daughter at bedside Disposition Plan: back to SNF  Time spent: 37 min  [facility] Pager [phone]

## 2014-07-23 NOTE — Progress Notes (Signed)
The patient's MRSA swab came back positive.

## 2014-07-23 NOTE — ED Notes (Signed)
Patient returned from X-ray 

## 2014-07-23 NOTE — ED Notes (Signed)
IV team paged for [facility] access.

## 2014-07-23 NOTE — ED Notes (Signed)
Assisted with urinal; with this minimal amount of activity, patient got very short of breath. Sats on 2L dropped to 84%, turned up to 3L and with rest recovered to 94%; unable to talk while recovering from activity. Currently able to talk and is not in any distress. Notified Dr. Stark Jock.

## 2014-07-23 NOTE — ED Provider Notes (Signed)
CSN: [id number]     Arrival date & time 07/23/14  4034 History   First MD Initiated Contact with Patient 07/23/14 [phone]     Chief Complaint  Patient presents with  . Shortness of Breath     (Consider location/radiation/quality/duration/timing/severity/associated sxs/prior Treatment) HPI Comments: Patient is a 80 year old male with extensive past medical history including diabetes, coronary artery disease, hypertension, lung CA, CHF. He presents today with complaints of shortness of breath that began this morning. He normally wears 2 L of oxygen by nasal cannula, however this morning he has to turn it up to 4 to get some relief. He denies to me he is having any chest pain, fever, productive cough. He is a resident of [facility]. He was given Lasix prior to being transferred here.  Patient is a 80 y.o. male presenting with shortness of breath. The history is provided by the patient.  Shortness of Breath Severity:  Moderate Onset quality:  Sudden Duration:  3 hours Timing:  Constant Progression:  Unchanged Chronicity:  New Relieved by:  Nothing Worsened by:  Nothing tried Ineffective treatments:  None tried Associated symptoms: no chest pain, no cough and no fever     Past Medical History  Diagnosis Date  . Peripheral arterial disease     s/p Ao Bifem Bypass  . Diabetes mellitus without complication   . CAD (coronary artery disease)     By  CT Scan  . Hyperlipidemia   . Hypertension   . Non Hodgkin's lymphoma 2000, 2010  . Peripheral neuropathy   . COPD (chronic obstructive pulmonary disease)   . History of lower GI bleeding   . Substance abuse   . Lung cancer, upper lobe     left lung  . AAA (abdominal aortic aneurysm)   . MI (myocardial infarction) 04/10/14   Past Surgical History  Procedure Laterality Date  . Fracture surgery  2009    Left ankle  . Pr vein bypass graft,aorto-fem-pop  2005    Right common femoral to BK popliteal BPG  . Pr vein bypass  graft,aorto-fem-pop  01-27-05    Revision of Right fem-pop BPG  . Cardiac catheterization  1990's  . Video bronchoscopy Bilateral 03/14/2014    Procedure: VIDEO BRONCHOSCOPY WITH FLUORO;  Surgeon: Rigoberto Noel, MD;  Location: [facility];  Service: Cardiopulmonary;  Laterality: Bilateral;  . Nm myoview ltd  11/2013    Lexiscan.  EF 60%, small fixed inferoapical defect - ? artifact  . Transthoracic echocardiogram  11/2013    EF 60-65%, Gr 1 DD, Mod AS, Sever MAC - no MS.      Family History  Problem Relation Age of Onset  . Cancer Mother     Colon  . Heart attack Mother   . Stroke Father   . Hyperlipidemia Father   . Hyperlipidemia Sister   . Hyperlipidemia Brother   . Hyperlipidemia Daughter   . Hypertension Son    History  Substance Use Topics  . Smoking status: Former Smoker -- 1.00 packs/day for 50 years    Types: Cigarettes    Quit date: 10/29/2003  . Smokeless tobacco: Never Used  . Alcohol Use: No    Review of Systems  Constitutional: Negative for fever.  Respiratory: Positive for shortness of breath. Negative for cough.   Cardiovascular: Negative for chest pain.  All other systems reviewed and are negative.     Allergies  Review of patient's allergies indicates no known allergies.  Home Medications   Prior  to Admission medications   Medication Sig Start Date End Date Taking? Authorizing Provider  amiodarone (PACERONE) 200 MG tablet Take 200 mg by mouth 2 (two) times daily.    Historical Provider, MD  aspirin 81 MG tablet Take 81 mg by mouth 2 (two) times daily.     Historical Provider, MD  DULoxetine (CYMBALTA) 30 MG capsule Take 30 mg by mouth daily. 04/27/13   Historical Provider, MD  fenofibrate micronized (LOFIBRA) 200 MG capsule Take 200 mg by mouth daily before breakfast.    Historical Provider, MD  finasteride (PROSCAR) 5 MG tablet Take 5 mg by mouth daily.    Historical Provider, MD  furosemide (LASIX) 40 MG tablet Take 40 mg by mouth daily.    Historical  Provider, MD  gabapentin (NEURONTIN) 600 MG tablet Take 600 mg by mouth 3 (three) times daily.     Historical Provider, MD  Glucosamine-Chondroit-Vit C-Mn (GLUCOSAMINE CHONDR 1500 COMPLX) CAPS Take 1 capsule by mouth daily.    Historical Provider, MD  hyaluronate sodium (RADIAPLEXRX) GEL Apply 1 application topically 2 (two) times daily.    Historical Provider, MD  insulin aspart (NOVOLOG) 100 UNIT/ML injection Inject 5 Units into the skin 3 (three) times daily with meals. 04/26/14   Tiffany L Reed, DO  insulin glargine (LANTUS) 100 UNIT/ML injection Inject 0.22 mLs (22 Units total) into the skin at bedtime. 04/16/14   Costin Karlyne Greenspan, MD  linagliptin (TRADJENTA) 5 MG TABS tablet Take 1 tablet (5 mg total) by mouth daily. With largest meal 07/12/14   Tiffany L Reed, DO  metoprolol tartrate (LOPRESSOR) 25 MG tablet Take 0.5 tablets (12.5 mg total) by mouth 2 (two) times daily. 06/02/14   Mihai Croitoru, MD  oxyCODONE-acetaminophen (PERCOCET/ROXICET) 5-325 MG per tablet Take one tablet by mouth every 4 hours as needed for pain 06/30/14   Tiffany L Reed, DO  potassium chloride 20 MEQ TBCR Take 40 mEq by mouth daily. 07/12/14   Tiffany L Reed, DO  prochlorperazine (COMPAZINE) 5 MG tablet Take 1 tablet (5 mg total) by mouth every 6 (six) hours as needed for nausea or vomiting. 04/08/14   Ladell Pier, MD  rosuvastatin (CRESTOR) 10 MG tablet Take 10 mg by mouth at bedtime.     Historical Provider, MD  sucralfate (CARAFATE) 1 G tablet Take 1 tablet (1 g total) by mouth 4 (four) times daily. 05/14/14   Kalman Drape, MD  Tamsulosin HCl (FLOMAX) 0.4 MG CAPS Take 0.4 mg by mouth daily.    Historical Provider, MD   BP 93/56  Pulse 85  Temp(Src) 97.5 F (36.4 C) (Oral)  Resp 16  Ht 5' 11.5" (1.816 m)  Wt 224 lb (101.606 kg)  BMI 30.81 kg/m2  SpO2 96% Physical Exam  Nursing note and vitals reviewed. Constitutional: He is oriented to person, place, and time. He appears well-developed and well-nourished. No  distress.  Patient is an elderly male in no acute distress. He is awake, alert, and appropriate.  HENT:  Head: Normocephalic and atraumatic.  Mouth/Throat: Oropharynx is clear and moist.  Neck: Normal range of motion. Neck supple.  Cardiovascular: Normal rate, regular rhythm and normal heart sounds.   No murmur heard. Pulmonary/Chest: Effort normal. He has no wheezes. He has rales.  There are slight rales in the bases bilaterally.  Abdominal: Soft. Bowel sounds are normal. He exhibits no distension. There is no tenderness.  Musculoskeletal: Normal range of motion. He exhibits edema.  There is 3+ bilateral lower extremity edema  present.  Lymphadenopathy:    He has no cervical adenopathy.  Neurological: He is alert and oriented to person, place, and time.  Skin: Skin is warm and dry. He is not diaphoretic.    ED Course  Procedures (including critical care time) Labs Review Labs Reviewed  CBC WITH DIFFERENTIAL  COMPREHENSIVE METABOLIC PANEL  TROPONIN I  PRO B NATRIURETIC PEPTIDE    Imaging Review No results found.   EKG Interpretation   Date/Time:  Saturday July 23 2014 10:17:58 EDT Ventricular Rate:  84 PR Interval:  200 QRS Duration: 142 QT Interval:  429 QTC Calculation: 507 R Axis:   -40 Text Interpretation:  Sinus rhythm Atrial premature complex Nonspecific  IVCD with LAD Consider anterior infarct Confirmed by DELOS  MD, Palin Tristan  ([id number]) on 07/23/2014 10:23:19 AM      MDM   Final diagnoses:  None    Patient is a 80 year old male with extensive past medical history. He was recently diagnosed with lung cancer and was treated with radiation and chemotherapy. He has a known left-sided pleural effusion which appears to have grown in size from prior studies. He becomes hypoxic with movement and exertion. He is also found to have an elevated BNP which raises my concern for an exacerbation of CHF.  I have discussed the case with Dr.Gudena from oncology who feels  as though his acute worsening is related to CHF and recommends admission to the hospitalist. I've spoken with Dr. Conley Canal who agrees to admit. He has been somewhat hypotensive with blood pressures in the 58N systolic. We have agreed to administer Lasix by IV to attempt to liberate some fluid. He has been noted to have an approximate 30 pound weight gain over the past several weeks.    Veryl Speak, MD 07/23/14 [phone]

## 2014-07-23 NOTE — ED Notes (Signed)
Patient transported to X-ray 

## 2014-07-23 NOTE — ED Notes (Signed)
Per EMS: called to [facility] for low O2 sats of 80% despite increasing oxygen to 4L (on O2 24/7 at 2L). Nurse at [facility] gave Lasix prior to EMS arrival. On EMS arrival, he was 85% on room air, and 96% on 2L in route. Patient reports waking up feeling short of breath this morning. States still feels like difficulty catching his breath.

## 2014-07-24 DIAGNOSIS — J438 Other emphysema: Secondary | ICD-10-CM

## 2014-07-24 DIAGNOSIS — IMO0001 Reserved for inherently not codable concepts without codable children: Secondary | ICD-10-CM

## 2014-07-24 DIAGNOSIS — I10 Essential (primary) hypertension: Secondary | ICD-10-CM

## 2014-07-24 DIAGNOSIS — F3289 Other specified depressive episodes: Secondary | ICD-10-CM

## 2014-07-24 DIAGNOSIS — E1165 Type 2 diabetes mellitus with hyperglycemia: Secondary | ICD-10-CM

## 2014-07-24 DIAGNOSIS — E785 Hyperlipidemia, unspecified: Secondary | ICD-10-CM

## 2014-07-24 DIAGNOSIS — F329 Major depressive disorder, single episode, unspecified: Secondary | ICD-10-CM

## 2014-07-24 DIAGNOSIS — J9620 Acute and chronic respiratory failure, unspecified whether with hypoxia or hypercapnia: Secondary | ICD-10-CM

## 2014-07-24 LAB — GLUCOSE, CAPILLARY
GLUCOSE-CAPILLARY: 135 mg/dL — AB (ref 70–99)
GLUCOSE-CAPILLARY: 191 mg/dL — AB (ref 70–99)
Glucose-Capillary: 72 mg/dL (ref 70–99)
Glucose-Capillary: 115 mg/dL — ABNORMAL HIGH (ref 70–99)

## 2014-07-24 LAB — BASIC METABOLIC PANEL
Anion gap: 10 (ref 5–15)
BUN: 16 mg/dL (ref 6–23)
CHLORIDE: 95 meq/L — AB (ref 96–112)
CO2: 33 meq/L — AB (ref 19–32)
Calcium: 8.7 mg/dL (ref 8.4–10.5)
Creatinine, Ser: 1.08 mg/dL (ref 0.50–1.35)
GFR calc Af Amer: 73 mL/min — ABNORMAL LOW (ref >90)
GFR calc non Af Amer: 63 mL/min — ABNORMAL LOW (ref >90)
GLUCOSE: 62 mg/dL — AB (ref 70–99)
POTASSIUM: 3.8 meq/L (ref 3.7–5.3)
SODIUM: 138 meq/L (ref 137–147)

## 2014-07-24 MED ORDER — TAMSULOSIN HCL 0.4 MG PO CAPS
0.4000 mg | ORAL_CAPSULE | Freq: Every day | ORAL | Status: DC
  Administered 2014-07-24 – 2014-08-01 (×9): 0.4 mg via ORAL
  Filled 2014-07-24 (×10): qty 1

## 2014-07-24 NOTE — Progress Notes (Signed)
Utilization Review Completed.   Shadrick Senne, RN, BSN Nurse Case Manager  

## 2014-07-24 NOTE — Evaluation (Signed)
Physical Therapy Evaluation Patient Details Name: [name] [name] MRN: [id number] DOB: June 07, 1933 Today's Date: 07/24/2014   History of Present Illness  With h/o diastolic HF, lung cancer s/p chemoradiation, chronic respiratory failure presents to ED from SNF with dyspnea starting last night. Had recent hospitalization at [facility] for CHF exacerbation, then went to SNF at [facility]. Weight 224 lbs, up from 193 lbs in July.  CXR shows left pleural effusion. Present on CT scan earlier this month (with interval decrease in size of left upper lobe perihilar/perimediastinal mass compatible with response to therapy). proBNP 21,000 (1000 in July). Echo 6/15 EF 53-29%, grade 1 diastolic dysfunction. C/o worsening leg edema.  Clinical Impression   Pt admitted with above. Pt currently with functional limitations due to the deficits listed below (see PT Problem List).  Pt will benefit from skilled PT to increase their independence and safety with mobility to allow discharge to the venue listed below.   RE: dc planning; pt was admitted from SNF where he was recieving PT, and seems to have responded well; Overall he is walking well, gait pattern WNL, his deficits are in O2 need and decr activity tolerance/DOE; Would like to have OT consult for assessment of activity tol with ADLs, to aide in decision making and to answer the question -- could pt safely dc home?     Follow Up Recommendations SNF to continue rehab (dc home with HHPT and HHRN is reasonable -- would family be able to assist with meal prep/energy conservation?)    Equipment Recommendations  None recommended by PT (I believe pt has a rollator)    Recommendations for Other Services OT consult (for energy conservation)     Precautions / Restrictions Precautions Precaution Comments: moniotro O2 sats with activity      Mobility  Bed Mobility Overal bed mobility: Modified Independent             General bed mobility  comments: noted pt used rails  Transfers Overall transfer level: Modified independent Equipment used: None             General transfer comment: no difficulty standing from low bed  Ambulation/Gait Ambulation/Gait assistance: Supervision Ambulation Distance (Feet): 100 Feet Assistive device: None Gait Pattern/deviations: Step-through pattern;WFL(Within Functional Limits) Gait velocity: slowed   General Gait Details: Overall moving well; gait pattern at or close to WNL; cues to self-monitor for activity tolerance; pt naturally took standing rest break, supporting self at desk at nurse's station; Tended to desat with walking (See O2 sat qualifying note)  Stairs            Wheelchair Mobility    Modified Rankin (Stroke Patients Only)       Balance Overall balance assessment: No apparent balance deficits (not formally assessed)                                           Pertinent Vitals/Pain Pain Assessment: No/denies pain See other PT note of this date     Home Living Family/patient expects to be discharged to:: Skilled nursing facility Living Arrangements: Other (Comment)               Additional Comments: Pt admitted to this acute stay from SNF where he was recieving rehab    Prior Function Level of Independence:  (prior to admission to [facility] 3 months ago)  Hand Dominance        Extremity/Trunk Assessment   Upper Extremity Assessment: Overall WFL for tasks assessed           Lower Extremity Assessment: Overall WFL for tasks assessed         Communication   Communication: No difficulties;Other (comment) (noted difficulty speaking iwth exertion)  Cognition Arousal/Alertness: Awake/alert Behavior During Therapy: WFL for tasks assessed/performed Overall Cognitive Status: Within Functional Limits for tasks assessed                      General Comments      Exercises         Assessment/Plan    PT Assessment Patient needs continued PT services  PT Diagnosis Other (comment) (Decreased functional capacity/decr cardiopulm status)   PT Problem List Decreased activity tolerance;Cardiopulmonary status limiting activity;Decreased knowledge of precautions  PT Treatment Interventions DME instruction;Gait training;Functional mobility training;Therapeutic activities;Patient/family education   PT Goals (Current goals can be found in the Care Plan section) Acute Rehab PT Goals Patient Stated Goal: did not state, but agreeable to amb PT Goal Formulation: With patient Time For Goal Achievement: 08/07/14 Potential to Achieve Goals: Good    Frequency Min 3X/week   Barriers to discharge Decreased caregiver support For home dc, pt must be completely independent, and he is close; my main concern is his medical status/ oxygen support needs, and will he be able to safely monitor himself for activity tolerance at home alone.    Co-evaluation               End of Session Equipment Utilized During Treatment: Oxygen Activity Tolerance: Patient tolerated treatment well;Other (comment) (limited by decr O2 sats) Patient left: in chair;with call bell/phone within reach Nurse Communication: Mobility status;Other (comment) (O2 sat needs)         Time: 0962-8366 PT Time Calculation (min): 23 min   Charges:   PT Evaluation $Initial PT Evaluation Tier I: 1 Procedure PT Treatments $Gait Training: 8-22 mins   PT G Codes:          Roney Marion Hamff 07/24/2014, 10:50 AM  Roney Marion, PT  Acute Rehabilitation Services Pager [phone] Office [phone]

## 2014-07-24 NOTE — Progress Notes (Signed)
[facility] PROGRESS NOTE Interim History: 80 y.o. male With h/o diastolic HF, lung cancer s/p chemoradiation, chronic respiratory failure presents to ED from SNF with dyspnea starting last night. Had recent hospitalization at [facility] for CHF exacerbation, then went to SNF at [facility]. Weight 224 lbs, up from 193 lbs in July. CXR shows left pleural effusion and vascular congestion. Also with increase Le edema.   Filed Weights   07/23/14 0922 07/24/14 0440  Weight: 101.606 kg (224 lb) 98.3 kg (216 lb 11.4 oz)        Intake/Output Summary (Last 24 hours) at 07/24/14 1345 Last data filed at 07/24/14 0441  Gross per 24 hour  Intake    640 ml  Output   1700 ml  Net  -1060 ml     Assessment/Plan: 1-Acute on chronic diastolic congestive heart failure:  -continue daily weights, low sodium diet and strict intake and output -continue IV lasix -apply TED hoses -already on b-blockers and nitrates (holding first one due to acute exacerbation and low BP)  2-acute on chronic resp failure: due to #1. Treatment as mentioned above  3-COPD (chronic obstructive pulmonary disease): stable. -continue home medication regimen and oxygen supplementation  4-Squamous cell carcinoma lung: continue follow up with oncology as an outpatient  5-CAD: troponin are neg -no CP -continue ASA and statins -once BP improved will resume coreg  6-DM (diabetes mellitus) type II controlled, neurological manifestation: continue SSI  7-HLD: continue statins  8-BPH: continue proscar and flomax  9-depression: continue celexa.    Code Status: Partial (DNI) Family Communication: no family at bedside Disposition Plan: back to SNF for rehab once volume is controlled and breathing improved.   Consultants:  None   Procedures: ECHO: 04/11/14 - Left ventricle: The cavity size was normal. Wall thickness was increased in a pattern of mild LVH. Systolic function was normal. The  estimated ejection fraction was in the range of 50% to 55%. Wall motion was normal; there were no regional wall motion abnormalities. Doppler parameters are consistent with abnormal left ventricular relaxation (grade 1 diastolic dysfunction). - Aortic valve: There was mild stenosis. Valve area (VTI): 1.84 cm^2. Valve area (Vmax): 1.61 cm^2. - Mitral valve: Calcified annulus. There was mild regurgitation.   Antibiotics:  None   HPI/Subjective: No fever. Feeling that breathing is better. Still with signs of fluid overload (positive JVD, increased Le edema and crackels on exam)  Objective: Filed Vitals:   07/23/14 1432 07/23/14 1759 07/23/14 2108 07/24/14 0440  BP:  95/65 96/64 97/59   Pulse:  89  69  Temp: 97.3 F (36.3 C) 97.3 F (36.3 C) 98.1 F (36.7 C) 98.3 F (36.8 C)  TempSrc: Oral Oral Oral Oral  Resp:  20 20 20   Height:      Weight:    98.3 kg (216 lb 11.4 oz)  SpO2:  99% 96% 95%     Exam:  General: Alert, awake, oriented x3, in no acute distress. Reports breathing is better.  HEENT: No bruits, no goiter. Mild JVD Heart: Regular rate; positive SEM, no rubs or gallops. 2++ Le edema bilaterally Lungs: decreased BS bibasilar and positive crackles; no wheezing Abdomen: Soft, nontender, nondistended, positive bowel sounds.  Neuro: Grossly intact, nonfocal.   Data Reviewed: Basic Metabolic Panel:  Recent Labs Lab 07/23/14 0955 07/24/14 0434  NA 136* 138  K 3.8 3.8  CL 95* 95*  CO2 31 33*  GLUCOSE 136* 62*  BUN 13 16  CREATININE 0.90 1.08  CALCIUM  8.6 8.7   Liver Function Tests:  Recent Labs Lab 07/23/14 0955  AST 23  ALT 19  ALKPHOS 39  BILITOT 0.7  PROT 5.8*  ALBUMIN 2.6*   CBC:  Recent Labs Lab 07/23/14 0955  WBC 3.5*  NEUTROABS 2.2  HGB 9.5*  HCT 29.7*  MCV 92.8  PLT 146*   Cardiac Enzymes:  Recent Labs Lab 07/23/14 0955  TROPONINI <0.30   BNP (last 3 results)  Recent Labs  04/10/14 1145 05/13/14 2240 07/23/14 0955    PROBNP 5244.0* 1118.0* 21437.0*   CBG:  Recent Labs Lab 07/23/14 2105 07/24/14 0605 07/24/14 1119  GLUCAP 210* 72 115*    Recent Results (from the past 240 hour(s))  CULTURE, ROUTINE-ABSCESS     Status: None   Collection Time    07/14/14  3:00 PM      Result Value Ref Range Status   Culture     Final   Value: Abundant METHICILLIN RESISTANT STAPHYLOCOCCUS AUREUS   Gram Stain Rare   Final   Gram Stain WBC present-predominately PMN   Final   Gram Stain No Squamous Epithelial Cells Seen   Final   Gram Stain Moderate Gram Positive Cocci In Pairs In Clusters   Final   Organism ID, Bacteria METHICILLIN RESISTANT STAPHYLOCOCCUS AUREUS   Final   Comment: Rifampin and Gentamicin should not be used as     single drugs for treatment of Staph infections.     This organism is presumed to be Clindamycin     resistant based on detection of inducible     Clindamycin resistance.  MRSA PCR SCREENING     Status: Abnormal   Collection Time    07/23/14  8:17 PM      Result Value Ref Range Status   MRSA by PCR POSITIVE (*) NEGATIVE Final   Comment:            The GeneXpert MRSA Assay (FDA     approved for NASAL specimens     only), is one component of a     comprehensive MRSA colonization     surveillance program. It is not     intended to diagnose MRSA     infection nor to guide or     monitor treatment for     MRSA infections.     RESULT CALLED TO, READ BACK BY AND VERIFIED WITH:     HARAWAY,J RN [id number] AT 2235 ON [id number] SKEEN,P     Studies: Dg Chest 2 View  07/23/2014   CLINICAL DATA:  Shortness of breath.  EXAM: CHEST  2 VIEW  COMPARISON:  CT 07/11/2014.  Chest x-ray  06/25/2014.  FINDINGS: Prior port catheter in good anatomic position. Mediastinum and stable. Heart size stable. Left upper lobe mass with left upper lobe atelectasis again noted. Left-sided pleural effusion again noted. Small right pleural effusion. No acute bony abnormality.  IMPRESSION: 1. Left upper lobe mass with  left upper lobe atelectasis appears stable. 2. Prominent left pleural effusion.  Small right pleural effusion. 3. Power port catheter in stable position.   Electronically Signed   By: Marcello Moores  Register   On: 07/23/2014 10:47    Scheduled Meds: . amiodarone  200 mg Oral BID  . antiseptic oral rinse  7 mL Mouth Rinse BID  . aspirin  81 mg Oral BID  . atorvastatin  40 mg Oral Daily  . Chlorhexidine Gluconate Cloth  6 each Topical Q0600  . citalopram  20 mg Oral Daily  .  docusate sodium  100 mg Oral BID  . enoxaparin (LOVENOX) injection  40 mg Subcutaneous Q24H  . finasteride  5 mg Oral Daily  . furosemide  40 mg Intravenous BID  . gabapentin  600 mg Oral BID  . insulin aspart  5 Units Subcutaneous BID  . insulin glargine  22 Units Subcutaneous QHS  . linagliptin  5 mg Oral Q supper  . mupirocin ointment  1 application Nasal BID  . potassium chloride  40 mEq Oral Daily  . sodium chloride  3 mL Intravenous Q12H  . sucralfate  1 g Oral QID  . tamsulosin  0.4 mg Oral QPC supper   Time: > 30 minutes   Barton Dubois  [facility] Pager [phone]. If 8PM-8AM, please contact night-coverage at [email or web address], password [facility] 07/24/2014, 1:45 PM  LOS: 1 day

## 2014-07-24 NOTE — Progress Notes (Signed)
Physical Therapy Treatment Note  SATURATION QUALIFICATIONS: (This note is used to comply with regulatory documentation for home oxygen)  Patient Saturations on Room Air at Rest = 90%  Patient Saturations on Room Air while Ambulating = 77%  Patient Saturations on 3-4 Liters of oxygen while Ambulating = 86-90%  Please briefly explain why patient needs home oxygen: Patient requires supplemental oxygen to maintain oxygen saturation at safe, acceptable levels during activity.  Full PT assessment to follow;  Thanks,  Roney Marion, [place]  Acute Rehabilitation Services Pager [phone] Office [phone]

## 2014-07-24 NOTE — Progress Notes (Signed)
The patient's manual BP is 88/54.  The patient states that he is asymptomatic.  Raliegh Ip Schorr was notified.  New orders were given to check his BP again and to notify her of it prior to giving the PO amiodarone.  The RN will carry out the orders.

## 2014-07-25 LAB — BASIC METABOLIC PANEL
ANION GAP: 8 (ref 5–15)
BUN: 18 mg/dL (ref 6–23)
CALCIUM: 8.7 mg/dL (ref 8.4–10.5)
CO2: 34 mEq/L — ABNORMAL HIGH (ref 19–32)
Chloride: 97 mEq/L (ref 96–112)
Creatinine, Ser: 1.08 mg/dL (ref 0.50–1.35)
GFR, EST AFRICAN AMERICAN: 73 mL/min — AB (ref >90)
GFR, EST NON AFRICAN AMERICAN: 63 mL/min — AB (ref >90)
GLUCOSE: 61 mg/dL — AB (ref 70–99)
Potassium: 3.5 mEq/L — ABNORMAL LOW (ref 3.7–5.3)
SODIUM: 139 meq/L (ref 137–147)

## 2014-07-25 LAB — GLUCOSE, CAPILLARY
GLUCOSE-CAPILLARY: 196 mg/dL — AB (ref 70–99)
Glucose-Capillary: 68 mg/dL — ABNORMAL LOW (ref 70–99)
Glucose-Capillary: 145 mg/dL — ABNORMAL HIGH (ref 70–99)
Glucose-Capillary: 182 mg/dL — ABNORMAL HIGH (ref 70–99)

## 2014-07-25 MED ORDER — INSULIN ASPART 100 UNIT/ML ~~LOC~~ SOLN
0.0000 [IU] | Freq: Three times a day (TID) | SUBCUTANEOUS | Status: DC
  Administered 2014-07-26: 1 [IU] via SUBCUTANEOUS
  Administered 2014-07-26: 3 [IU] via SUBCUTANEOUS
  Administered 2014-07-27 – 2014-07-28 (×2): 2 [IU] via SUBCUTANEOUS
  Administered 2014-07-28: 1 [IU] via SUBCUTANEOUS
  Administered 2014-07-29 (×2): 2 [IU] via SUBCUTANEOUS
  Administered 2014-07-30: 1 [IU] via SUBCUTANEOUS
  Administered 2014-07-30: 2 [IU] via SUBCUTANEOUS
  Administered 2014-07-31: 3 [IU] via SUBCUTANEOUS
  Administered 2014-08-02: 2 [IU] via SUBCUTANEOUS
  Administered 2014-08-02: 3 [IU] via SUBCUTANEOUS

## 2014-07-25 MED ORDER — PHENOL 1.4 % MT LIQD
1.0000 | OROMUCOSAL | Status: DC | PRN
  Filled 2014-07-25 (×2): qty 177

## 2014-07-25 MED ORDER — PANTOPRAZOLE SODIUM 40 MG PO TBEC
40.0000 mg | DELAYED_RELEASE_TABLET | Freq: Every day | ORAL | Status: DC
  Administered 2014-07-25 – 2014-08-02 (×9): 40 mg via ORAL
  Filled 2014-07-25 (×9): qty 1

## 2014-07-25 NOTE — Progress Notes (Signed)
Physical Therapy Treatment Patient Details Name: VONN SLIGER MRN: [id number] DOB: 1933/01/25 Today's Date: 07/25/2014    History of Present Illness With h/o diastolic HF, lung cancer s/p chemoradiation, chronic respiratory failure presents to ED from SNF with dyspnea starting last night. Had recent hospitalization at [facility] for CHF exacerbation, then went to SNF at [facility]. Weight 224 lbs, up from 193 lbs in July.  CXR shows left pleural effusion. Present on CT scan earlier this month (with interval decrease in size of left upper lobe perihilar/perimediastinal mass compatible with response to therapy). proBNP 21,000 (1000 in July). Echo 6/15 EF 22-97%, grade 1 diastolic dysfunction. C/o worsening leg edema.    PT Comments    Pt progressing, continues to desat with activity (see notes in gait section), able to verbalize need for rest/recovery  Follow Up Recommendations  Home health PT;SNF (depending on progress/safety)     Equipment Recommendations  None recommended by PT    Recommendations for Other Services       Precautions / Restrictions Precautions Precaution Comments: monitor 02 sats    Mobility  Bed Mobility                  Transfers Overall transfer level: Modified independent Equipment used: None             General transfer comment: no difficulty standing from low bed, no AD  Ambulation/Gait Ambulation/Gait assistance: Supervision;Modified independent (Device/Increase time) Ambulation Distance (Feet): 160 Feet Assistive device: None Gait Pattern/deviations: Step-through pattern;WFL(Within Functional Limits)     General Gait Details: standing rest, PT asked pt to self direct rest periods when needed; pt with standing rest after 80'; sats 92% on 2.5L before amb, 87% on #L during/after, >92% on 2.5 L at rest after activity   Stairs            Wheelchair Mobility    Modified Rankin (Stroke Patients Only)        Balance Overall balance assessment: No apparent balance deficits (not formally assessed)                                  Cognition Arousal/Alertness: Awake/alert Behavior During Therapy: WFL for tasks assessed/performed Overall Cognitive Status: Within Functional Limits for tasks assessed                      Exercises      General Comments General comments (skin integrity, edema, etc.): pt progressing; reviewed energy conservation with regard to amb/mobility; pt states he just "lays down" at home      Pertinent Vitals/Pain Pain Assessment: No/denies pain    Home Living                      Prior Function            PT Goals (current goals can now be found in the care plan section) Acute Rehab PT Goals Patient Stated Goal: return to PLOF Time For Goal Achievement: 08/07/14 Potential to Achieve Goals: Good Progress towards PT goals: Progressing toward goals    Frequency  Min 3X/week    PT Plan Current plan remains appropriate    Co-evaluation             End of Session Equipment Utilized During Treatment: Oxygen Activity Tolerance: Patient tolerated treatment well Patient left: Other (comment);with call bell/phone within reach (EOB)  Time: 1339-1400 PT Time Calculation (min): 21 min  Charges:  $Gait Training: 8-22 mins                    G Codes:      Esti Demello 27-Jul-2014, 2:01 PM

## 2014-07-25 NOTE — Progress Notes (Signed)
Inpatient Diabetes Program Recommendations  AACE/ADA: New Consensus Statement on Inpatient Glycemic Control (2013)  Target Ranges:  Prepandial:   less than 140 mg/dL      Peak postprandial:   less than 180 mg/dL (1-2 hours)      Critically ill patients:  140 - 180 mg/dL   Reason for Assessment: Hypoglycemia  Diabetes history: Type 2 diabetes Outpatient Diabetes medications: Lantus 22 units at HS, Novolog 5 units bid, Tradjenta 5 mg daily Current orders for Inpatient glycemic control: Lantus 22 units at HS, Novolog 5 units bid, Tradjenta 5 mg daily  Results for [name] [name], [name] [name] (MRN [id number]) as of 07/25/2014 13:20  Ref. Range 07/23/2014 21:05 07/24/2014 06:05 07/24/2014 11:19 07/24/2014 16:32 07/24/2014 19:54 07/25/2014 06:46 07/25/2014 10:58  Glucose-Capillary Latest Range: 70-99 mg/dL 210 (H) 72 115 (H) 135 (H) 191 (H) 68 (L) 145 (H)   Results for [name] [name], [name] [name] (MRN [id number]) as of 07/25/2014 13:20  Ref. Range 07/23/2014 09:55 07/24/2014 04:34 07/25/2014 05:35  Glucose Latest Range: 70-99 mg/dL 136 (H) 62 (L) 61 (L)   Note:  Suspect schedule at SNF for Novolog 5 units is at breakfast and supper rather than current schedule 10:00 and 22:00.  CBG dropping when patient received Novolog 5 units as scheduled probably because not being given with a meal.  Food intake seems variable.  Would suggest the following:  Stop regularly scheduled Novolog 5 units bid at 10:00 and 22:00  Start Novolog correction sensitive scale tid  Add meal coverage Novolog 3 units tid with meals when patient's appetite improves-- to be given if patient eats at least 50% and CBG > 80 mg/dl Thank you.  Alix Stowers S. Marcelline Mates, RN, CNS, CDE Inpatient Diabetes Program, team pager [phone]

## 2014-07-25 NOTE — Progress Notes (Signed)
The patient's CBG was 68.  Graham crackers and peanut butter were given to the patient.

## 2014-07-25 NOTE — Progress Notes (Signed)
[facility] PROGRESS NOTE Interim History: 80 y.o. male With h/o diastolic HF, lung cancer s/p chemoradiation, chronic respiratory failure presents to ED from SNF with dyspnea starting last night. Had recent hospitalization at [facility] for CHF exacerbation, then went to SNF at [facility]. Weight 224 lbs, up from 193 lbs in July. CXR shows left pleural effusion and vascular congestion. Also with increase Le edema.   Filed Weights   07/23/14 0922 07/24/14 0440 07/25/14 0422  Weight: 101.606 kg (224 lb) 98.3 kg (216 lb 11.4 oz) 98 kg (216 lb 0.8 oz)        Intake/Output Summary (Last 24 hours) at 07/25/14 1503 Last data filed at 07/25/14 1439  Gross per 24 hour  Intake   1090 ml  Output   1325 ml  Net   -235 ml    Assessment/Plan: 1-Acute on chronic diastolic congestive heart failure:  -continue daily weights, low sodium diet and strict intake and output -continue IV lasix -continue TED hoses -already on b-blockers (holding due to acute exacerbation and low BP) and nitrates (holding due to low BP) -patient is approx 8 pounds lighter since admisison  2-acute on chronic resp failure: due to #1. Treatment as mentioned above  3-COPD (chronic obstructive pulmonary disease): stable. -continue home medication regimen and oxygen supplementation -no wheezing  4-Squamous cell carcinoma lung: continue follow up with oncology as an outpatient  5-CAD: troponin are neg -no CP -continue ASA and statins -once BP improved will resume coreg, continue holding for now  6-DM (diabetes mellitus) type II controlled, neurological manifestation: continue SSI, lantus and linagliptin  -continue neurontin  7-HLD: continue statins  8-BPH: continue proscar and flomax.  9-depression: continue celexa.  10-GERD: continue PPI and sucralfate     Code Status: Partial (DNI) Family Communication: no family at bedside Disposition Plan: back to SNF for rehab once volume is  controlled and breathing improved.   Consultants:  None   Procedures: ECHO: 04/11/14 - Left ventricle: The cavity size was normal. Wall thickness was increased in a pattern of mild LVH. Systolic function was normal. The estimated ejection fraction was in the range of 50% to 55%. Wall motion was normal; there were no regional wall motion abnormalities. Doppler parameters are consistent with abnormal left ventricular relaxation (grade 1 diastolic dysfunction). - Aortic valve: There was mild stenosis. Valve area (VTI): 1.84 cm^2. Valve area (Vmax): 1.61 cm^2. - Mitral valve: Calcified annulus. There was mild regurgitation.  Antibiotics:  None   HPI/Subjective: No fever. Complaining of sore throat and hoarseness. Patient endorses breathing continue improving. No CP.  Objective: Filed Vitals:   07/24/14 1958 07/24/14 2022 07/24/14 2216 07/25/14 0422  BP:  88/54 90/64 108/82  Pulse: 96 100  111  Temp: 97.5 F (36.4 C)   97.5 F (36.4 C)  TempSrc: Oral   Oral  Resp: 20   20  Height:      Weight:    98 kg (216 lb 0.8 oz)  SpO2: 97%   96%     Exam:  General: Alert, awake, oriented x3, in no acute distress. Afebrile. Reports breathing continue to improve. Slightly hoarse this morning   HEENT: No bruits, no goiter. Mild JVD Heart: Regular rate; positive SEM, no rubs or gallops. 2++ Le edema bilaterally Lungs: decreased BS bibasilar and positive crackles; no wheezing Abdomen: Soft, nontender, nondistended, positive bowel sounds.  Neuro: Grossly intact, nonfocal.   Data Reviewed: Basic Metabolic Panel:  Recent Labs Lab 07/23/14 0955 07/24/14 0434  07/25/14 0535  NA 136* 138 139  K 3.8 3.8 3.5*  CL 95* 95* 97  CO2 31 33* 34*  GLUCOSE 136* 62* 61*  BUN 13 16 18   CREATININE 0.90 1.08 1.08  CALCIUM 8.6 8.7 8.7   Liver Function Tests:  Recent Labs Lab 07/23/14 0955  AST 23  ALT 19  ALKPHOS 39  BILITOT 0.7  PROT 5.8*  ALBUMIN 2.6*   CBC:  Recent Labs Lab  07/23/14 0955  WBC 3.5*  NEUTROABS 2.2  HGB 9.5*  HCT 29.7*  MCV 92.8  PLT 146*   Cardiac Enzymes:  Recent Labs Lab 07/23/14 0955  TROPONINI <0.30   BNP (last 3 results)  Recent Labs  04/10/14 1145 05/13/14 2240 07/23/14 0955  PROBNP 5244.0* 1118.0* 21437.0*   CBG:  Recent Labs Lab 07/24/14 1119 07/24/14 1632 07/24/14 1954 07/25/14 0646 07/25/14 1058  GLUCAP 115* 135* 191* 68* 145*    Recent Results (from the past 240 hour(s))  MRSA PCR SCREENING     Status: Abnormal   Collection Time    07/23/14  8:17 PM      Result Value Ref Range Status   MRSA by PCR POSITIVE (*) NEGATIVE Final   Comment:            The GeneXpert MRSA Assay (FDA     approved for NASAL specimens     only), is one component of a     comprehensive MRSA colonization     surveillance program. It is not     intended to diagnose MRSA     infection nor to guide or     monitor treatment for     MRSA infections.     RESULT CALLED TO, READ BACK BY AND VERIFIED WITH:     HARAWAY,J RN [id number] AT 2235 ON [id number] SKEEN,P     Studies: No results found.  Scheduled Meds: . amiodarone  200 mg Oral BID  . antiseptic oral rinse  7 mL Mouth Rinse BID  . aspirin  81 mg Oral BID  . atorvastatin  40 mg Oral Daily  . Chlorhexidine Gluconate Cloth  6 each Topical Q0600  . citalopram  20 mg Oral Daily  . docusate sodium  100 mg Oral BID  . enoxaparin (LOVENOX) injection  40 mg Subcutaneous Q24H  . finasteride  5 mg Oral Daily  . furosemide  40 mg Intravenous BID  . gabapentin  600 mg Oral BID  . insulin aspart  5 Units Subcutaneous BID  . insulin glargine  22 Units Subcutaneous QHS  . linagliptin  5 mg Oral Q supper  . mupirocin ointment  1 application Nasal BID  . potassium chloride  40 mEq Oral Daily  . sodium chloride  3 mL Intravenous Q12H  . sucralfate  1 g Oral QID  . tamsulosin  0.4 mg Oral QPC supper   Time: > 30 minutes   Barton Dubois  [facility] Pager [phone]. If  8PM-8AM, please contact night-coverage at [email or web address], password [facility] 07/25/2014, 3:03 PM  LOS: 2 days

## 2014-07-25 NOTE — Evaluation (Signed)
Occupational Therapy Evaluation Patient Details Name: [name] [name] MRN: [id number] DOB: [birth year] Today's Date: 07/25/2014    History of Present Illness With h/o diastolic HF, lung cancer s/p chemoradiation, chronic respiratory failure presents to ED from SNF with dyspnea starting last night. Had recent hospitalization at [facility] for CHF exacerbation, then went to SNF at [facility]. Weight 224 lbs, up from 193 lbs in July.  CXR shows left pleural effusion. Present on CT scan earlier this month (with interval decrease in size of left upper lobe perihilar/perimediastinal mass compatible with response to therapy). proBNP 21,000 (1000 in July). Echo 6/15 EF 75-10%, grade 1 diastolic dysfunction. C/o worsening leg edema.   Clinical Impression   Pt was in [facility] prior to admission receiving rehab.  Pt currently is functioning at a supervision level in mobility and ADL, primarily to remind him to monitor his fatigue and breath. Educated in energy conservation strategies and breathing techniques.  Will follow acutely.    Follow Up Recommendations  SNF (may be able to return home)    Equipment Recommendations       Recommendations for Other Services       Precautions / Restrictions Precautions Precaution Comments: monitor 02 sats      Mobility Bed Mobility Overal bed mobility: Modified Independent                Transfers Overall transfer level: Modified independent Equipment used: Rolling walker (2 wheeled)             General transfer comment: no difficulty standing from low bed or toilet    Balance                                            ADL Overall ADL's : Needs assistance/impaired Eating/Feeding: Independent;Sitting   Grooming: Supervision/safety;Wash/dry hands;Standing   Upper Body Bathing: Set up;Sitting   Lower Body Bathing: Supervison/ safety;Sit to/from stand   Upper Body Dressing : Set up;Sitting    Lower Body Dressing: Supervision/safety;Sit to/from stand Lower Body Dressing Details (indicate cue type and reason): able to donn socks crossing foot over opposite knee Toilet Transfer: Supervision/safety;Ambulation;Regular Toilet;Grab bars;RW   Toileting- Water quality scientist and Hygiene: Supervision/safety;Sit to/from stand       Functional mobility during ADLs: Supervision/safety;Rolling walker General ADL Comments: instructed in energy conservation and left handout for his review     Vision                     Perception     Praxis      Pertinent Vitals/Pain Pain Assessment: No/denies pain     Hand Dominance Right   Extremity/Trunk Assessment Upper Extremity Assessment Upper Extremity Assessment: Overall WFL for tasks assessed   Lower Extremity Assessment Lower Extremity Assessment: Defer to PT evaluation       Communication Communication Communication: No difficulties;Other (comment) (voice is hoarse)   Cognition Arousal/Alertness: Awake/alert Behavior During Therapy: WFL for tasks assessed/performed Overall Cognitive Status: Within Functional Limits for tasks assessed                     General Comments       Exercises       Shoulder Instructions      Home Living Family/patient expects to be discharged to:: Unsure Living Arrangements:  (pt was in a SNF PTA for rehab)  Available Help at Discharge:  (pt is unsure if he could arrange 24 hour assist) Type of Home: Mobile home Home Access: Ramped entrance     Home Layout: One level     Bathroom Shower/Tub: Occupational psychologist: Standard     Home Equipment: Environmental consultant - 2 wheels;Cane - single point;Wheelchair - manual          Prior Functioning/Environment Level of Independence: Needs assistance (was assisted with ADL in SNF)             OT Diagnosis: Generalized weakness   OT Problem List: Impaired balance (sitting and/or standing);Cardiopulmonary status  limiting activity;Increased edema;Decreased activity tolerance   OT Treatment/Interventions: Self-care/ADL training;Energy conservation;DME and/or AE instruction;Patient/family education;Balance training    OT Goals(Current goals can be found in the care plan section) Acute Rehab OT Goals Patient Stated Goal: return to PLOF OT Goal Formulation: With patient Time For Goal Achievement: 08/01/14 Potential to Achieve Goals: Good ADL Goals Pt Will Perform Grooming: with modified independence;standing Pt Will Perform Lower Body Bathing: with modified independence;sit to/from stand;with adaptive equipment Pt Will Perform Lower Body Dressing: with modified independence;with adaptive equipment;sit to/from stand Pt Will Transfer to Toilet: with modified independence;ambulating;regular height toilet Pt Will Perform Toileting - Clothing Manipulation and hygiene: with modified independence;sit to/from stand Pt Will Perform Tub/Shower Transfer: Shower transfer;with supervision;rolling walker;ambulating;shower seat Additional ADL Goal #1: Pt will utilize energy conservation strategies and monitor fatigue and breathing independently.  OT Frequency: Min 2X/week   Barriers to D/C:            Co-evaluation              End of Session    Activity Tolerance: Patient tolerated treatment well Patient left: in chair;with call bell/phone within reach;with chair alarm set   Time: 2130-8657 OT Time Calculation (min): 37 min Charges:  OT General Charges $OT Visit: 1 Procedure OT Evaluation $Initial OT Evaluation Tier I: 1 Procedure OT Treatments $Self Care/Home Management : 23-37 mins G-Codes:    [name] [name] 07/25/2014, 10:19 AM [phone]

## 2014-07-25 NOTE — Progress Notes (Signed)
[name] [name] was notified about the patient's manual BP of 90/64 at 2230 and the patient received his amiodarone as ordered.

## 2014-07-26 ENCOUNTER — Inpatient Hospital Stay (HOSPITAL_COMMUNITY): Payer: Medicare Other

## 2014-07-26 DIAGNOSIS — C3410 Malignant neoplasm of upper lobe, unspecified bronchus or lung: Secondary | ICD-10-CM

## 2014-07-26 LAB — BASIC METABOLIC PANEL
Anion gap: 9 (ref 5–15)
BUN: 18 mg/dL (ref 6–23)
CHLORIDE: 95 meq/L — AB (ref 96–112)
CO2: 35 meq/L — AB (ref 19–32)
CREATININE: 1.03 mg/dL (ref 0.50–1.35)
Calcium: 8.4 mg/dL (ref 8.4–10.5)
GFR calc Af Amer: 78 mL/min — ABNORMAL LOW (ref >90)
GFR calc non Af Amer: 67 mL/min — ABNORMAL LOW (ref >90)
Glucose, Bld: 88 mg/dL (ref 70–99)
POTASSIUM: 3.6 meq/L — AB (ref 3.7–5.3)
Sodium: 139 mEq/L (ref 137–147)

## 2014-07-26 LAB — GLUCOSE, CAPILLARY
GLUCOSE-CAPILLARY: 81 mg/dL (ref 70–99)
GLUCOSE-CAPILLARY: 81 mg/dL (ref 70–99)
GLUCOSE-CAPILLARY: 141 mg/dL — AB (ref 70–99)
GLUCOSE-CAPILLARY: 104 mg/dL — AB (ref 70–99)
Glucose-Capillary: 207 mg/dL — ABNORMAL HIGH (ref 70–99)

## 2014-07-26 MED ORDER — FUROSEMIDE 10 MG/ML IJ SOLN
40.0000 mg | Freq: Every day | INTRAMUSCULAR | Status: DC
  Administered 2014-07-27 – 2014-07-28 (×2): 40 mg via INTRAVENOUS
  Filled 2014-07-26 (×3): qty 4

## 2014-07-26 MED ORDER — METOLAZONE 2.5 MG PO TABS
2.5000 mg | ORAL_TABLET | Freq: Every day | ORAL | Status: DC
  Administered 2014-07-26 – 2014-08-02 (×8): 2.5 mg via ORAL
  Filled 2014-07-26 (×8): qty 1

## 2014-07-26 NOTE — Progress Notes (Signed)
[facility] PROGRESS NOTE Interim History: 80 y.o. male With h/o diastolic HF, lung cancer s/p chemoradiation, chronic respiratory failure presents to ED from SNF with dyspnea starting last night. Had recent hospitalization at [facility] for CHF exacerbation, then went to SNF at [facility]. Weight 224 lbs, up from 195 lbs at discharge in July. CXR shows left pleural effusion and vascular congestion. Also with increase LE edema.   Filed Weights   07/26/14 0500 07/26/14 0700 07/26/14 0951  Weight: 97.932 kg (215 lb 14.4 oz) 101.5 kg (223 lb 12.3 oz) 97.8 kg (215 lb 9.8 oz)        Intake/Output Summary (Last 24 hours) at 07/26/14 1651 Last data filed at 07/26/14 1356  Gross per 24 hour  Intake   1200 ml  Output    550 ml  Net    650 ml    Assessment/Plan: 1-Acute on chronic diastolic congestive heart failure:  -continue daily weights, low sodium diet and strict intake and output -continue IV lasix 40mg  daily and will add metolazone  -continue TED hoses -already on b-blockers (holding it due to acute exacerbation and low BP) and nitrates (holding due to low BP) -patient is approx 9 pounds lighter since admisison -BP soft and limiting how fast we can diurese him.. -dry weight 195-196.  2-acute on chronic resp failure: due to #1. Treatment as mentioned above  3-COPD (chronic obstructive pulmonary disease): stable. -continue home medication regimen and oxygen supplementation (2-3L at baseline) -no wheezing  4-Squamous cell carcinoma lung: continue follow up with oncology as an outpatient  5-CAD: troponin are neg -no CP -continue ASA and statins -once BP improved will resume coreg, continue holding for now  6-DM (diabetes mellitus) type II controlled, neurological manifestation: continue SSI, lantus and linagliptin  -continue neurontin  7-HLD: continue statins  8-BPH: continue proscar and flomax.  9-depression: continue celexa.  10-GERD: continue  PPI and sucralfate     Code Status: Partial (DNI) Family Communication: no family at bedside Disposition Plan: back to SNF for rehab once volume is controlled and breathing improved.   Consultants:  None   Procedures: ECHO: 04/11/14 - Left ventricle: The cavity size was normal. Wall thickness was increased in a pattern of mild LVH. Systolic function was normal. The estimated ejection fraction was in the range of 50% to 55%. Wall motion was normal; there were no regional wall motion abnormalities. Doppler parameters are consistent with abnormal left ventricular relaxation (grade 1 diastolic dysfunction). - Aortic valve: There was mild stenosis. Valve area (VTI): 1.84 cm^2. Valve area (Vmax): 1.61 cm^2. - Mitral valve: Calcified annulus. There was mild regurgitation.  Antibiotics:  None   HPI/Subjective: No fever. Reports he is feeling better, breathing easier and with legs less swollen. Still hoarse, no sore throat. No CP.  Objective: Filed Vitals:   07/26/14 0500 07/26/14 0700 07/26/14 0951 07/26/14 1408  BP: 87/61 98/58  89/54  Pulse: 92 72  97  Temp: 97.8 F (36.6 C) 98.3 F (36.8 C)  98.3 F (36.8 C)  TempSrc: Oral Oral  Oral  Resp: 20 18  18   Height:      Weight: 97.932 kg (215 lb 14.4 oz) 101.5 kg (223 lb 12.3 oz) 97.8 kg (215 lb 9.8 oz)   SpO2: 99% 92%  92%     Exam: General: Alert, awake, oriented x3, in no acute distress. Afebrile. Continue improving and breathing better. Still hoarse; endorses LE less swollen this morning   HEENT: No bruits, no  goiter. Mild JVD Heart: Regular rate; positive SEM, no rubs or gallops. 2+ LE edema bilaterally Lungs: decreased BS bibasilar and fine crackles; no wheezing Abdomen: Soft, nontender, nondistended, positive bowel sounds.  Neuro: Grossly intact, nonfocal.   Data Reviewed: Basic Metabolic Panel:  Recent Labs Lab 07/23/14 0955 07/24/14 0434 07/25/14 0535 07/26/14 0601  NA 136* 138 139 139  K 3.8 3.8 3.5*  3.6*  CL 95* 95* 97 95*  CO2 31 33* 34* 35*  GLUCOSE 136* 62* 61* 88  BUN 13 16 18 18   CREATININE 0.90 1.08 1.08 1.03  CALCIUM 8.6 8.7 8.7 8.4   Liver Function Tests:  Recent Labs Lab 07/23/14 0955  AST 23  ALT 19  ALKPHOS 39  BILITOT 0.7  PROT 5.8*  ALBUMIN 2.6*   CBC:  Recent Labs Lab 07/23/14 0955  WBC 3.5*  NEUTROABS 2.2  HGB 9.5*  HCT 29.7*  MCV 92.8  PLT 146*   Cardiac Enzymes:  Recent Labs Lab 07/23/14 0955  TROPONINI <0.30   BNP (last 3 results)  Recent Labs  04/10/14 1145 05/13/14 2240 07/23/14 0955  PROBNP 5244.0* 1118.0* 21437.0*   CBG:  Recent Labs Lab 07/25/14 2145 07/26/14 0610 07/26/14 0625 07/26/14 1205 07/26/14 1558  GLUCAP 196* 81 81 141* 207*    Recent Results (from the past 240 hour(s))  MRSA PCR SCREENING     Status: Abnormal   Collection Time    07/23/14  8:17 PM      Result Value Ref Range Status   MRSA by PCR POSITIVE (*) NEGATIVE Final   Comment:            The GeneXpert MRSA Assay (FDA     approved for NASAL specimens     only), is one component of a     comprehensive MRSA colonization     surveillance program. It is not     intended to diagnose MRSA     infection nor to guide or     monitor treatment for     MRSA infections.     RESULT CALLED TO, READ BACK BY AND VERIFIED WITH:     HARAWAY,J RN [id number] AT 2235 ON [id number] SKEEN,P     Studies: Dg Chest 2 View  07/26/2014   CLINICAL DATA:  Difficulty breathing; history of lung carcinoma  EXAM: CHEST  2 VIEW  COMPARISON:  July 23, 2014 chest radiograph and chest CT July 11, 2004  FINDINGS: Port-A-Cath tip is in the superior vena cava. No pneumothorax. The previously noted left upper lobe mass with volume loss persists without appreciable change. There is a moderate effusion on the left. Elsewhere there is generalized interstitial edema. Masses in the right lower lobe seen on recent CT are not well seen by radiography. The heart size is normal. Pulmonary  vascularity is within normal limits. There is atherosclerotic change in the aorta. No bone lesions.  IMPRESSION: Persistent left effusion and generalized interstitial edema. Left upper lobe mass with volume loss persists. Masses in the right lower lobe seen on recent CT are not well seen by radiography. No change in cardiac silhouette. No pneumothorax.   Electronically Signed   By: Lowella Grip M.D.   On: 07/26/2014 09:01    Scheduled Meds: . amiodarone  200 mg Oral BID  . antiseptic oral rinse  7 mL Mouth Rinse BID  . aspirin  81 mg Oral BID  . atorvastatin  40 mg Oral Daily  . Chlorhexidine Gluconate Cloth  6 each  Topical Q0600  . citalopram  20 mg Oral Daily  . docusate sodium  100 mg Oral BID  . enoxaparin (LOVENOX) injection  40 mg Subcutaneous Q24H  . finasteride  5 mg Oral Daily  . [START ON 07/27/2014] furosemide  40 mg Intravenous Daily  . gabapentin  600 mg Oral BID  . insulin aspart  0-9 Units Subcutaneous TID WC  . insulin glargine  22 Units Subcutaneous QHS  . linagliptin  5 mg Oral Q supper  . metolazone  2.5 mg Oral Daily  . mupirocin ointment  1 application Nasal BID  . pantoprazole  40 mg Oral Daily  . potassium chloride  40 mEq Oral Daily  . sodium chloride  3 mL Intravenous Q12H  . sucralfate  1 g Oral QID  . tamsulosin  0.4 mg Oral QPC supper   Time: > 30 minutes   Barton Dubois  [facility] Pager [phone]. If 8PM-8AM, please contact night-coverage at [email or web address], password [facility] 07/26/2014, 4:51 PM  LOS: 3 days

## 2014-07-26 NOTE — Progress Notes (Signed)
Occupational Therapy Treatment Patient Details Name: [name] [name] MRN: [id number] DOB: 1933-09-17 Today's Date: 07/26/2014    History of present illness With h/o diastolic HF, lung cancer s/p chemoradiation, chronic respiratory failure presents to ED from SNF with dyspnea starting last night. Had recent hospitalization at [facility] for CHF exacerbation, then went to SNF at [facility]. Weight 224 lbs, up from 193 lbs in July.  CXR shows left pleural effusion. Present on CT scan earlier this month (with interval decrease in size of left upper lobe perihilar/perimediastinal mass compatible with response to therapy). proBNP 21,000 (1000 in July). Echo 6/15 EF 70-26%, grade 1 diastolic dysfunction. C/o worsening leg edema.   OT comments  Reinforced energy conservation education with pt and reviewed handout information. Pt states he was just up to the bathroom and has already washed up but did agree to mobilize in the room and then transfer to the chair. Donned socks at EOB. Does demonstrate some decreased safety awareness as noted in ADL section including wanting to stand in TED  Hose that are slick on hard floor surface and not managing O2 tubing with decreased awareness of it tangling around furniture. Will follow. He does have some noted SOB with activity on O2. May benefit from short return stay at SNF to increase safety awareness for d/c home alone.   Follow Up Recommendations  SNF    Equipment Recommendations  None recommended by OT    Recommendations for Other Services      Precautions / Restrictions Precautions Precaution Comments: monitor 02 sats       Mobility Bed Mobility                  Transfers Overall transfer level: Modified independent                    Balance                                   ADL       Grooming: Supervision/safety;Standing       Lower Body Bathing: Sitting/lateral leans;Set up (socks)            Toilet Transfer: Supervision/safety (around room and to chair. pt states he was just in bathroom)             General ADL Comments: Pt demonstrates some decreased safety awarenes with wanting to stand in his white TED hose and no gripper socks. Explained that the hard floor is slick in the TED hose and better to wear gripper socks. He also tends to move around with decreased awareness of O2 tubing getting tangled on rails, chair, etc. Needs supervision for safety with completing these functional tasks safely. Discussed energy conservation handout issued by OT previous visit and pt able to state several of the techniques on his own. Reviewed more techniques with pt and also focused on PLB to help when becoming SOB. Note just with around the room functional mobility that pt with 1-2/4 dyspnea. O2 monitor in room not reading well at times and dynap reading with poor signal. O2 finally reading at 90-95% on 3L with EOB activity. After in room activity and brief seated rest, sats 96% on 3l.  Pt states he gave away his tubseat so educatd on option of where to obtain a seat versus he states he may know someone that has a seat.  Vision                     Perception     Praxis      Cognition   Behavior During Therapy: [facility] for tasks assessed/performed Overall Cognitive Status:  (some decrease safety awareness with O2 tubing)                       Extremity/Trunk Assessment               Exercises     Shoulder Instructions       General Comments      Pertinent Vitals/ Pain       Pain Assessment: No/denies pain  Home Living                                          Prior Functioning/Environment              Frequency Min 2X/week     Progress Toward Goals  OT Goals(current goals can now be found in the care plan section)  Progress towards OT goals: Progressing toward goals     Plan Discharge plan remains appropriate     Co-evaluation                 End of Session     Activity Tolerance Patient tolerated treatment well   Patient Left in chair;with call bell/phone within reach   Nurse Communication          Time: 0300-9233 OT Time Calculation (min): 24 min  Charges: OT General Charges $OT Visit: 1 Procedure OT Treatments $Self Care/Home Management : 8-22 mins $Therapeutic Activity: 8-22 mins  Jules Schick [phone] 07/26/2014, 12:09 PM

## 2014-07-26 NOTE — Care Management Note (Addendum)
    Page 1 of 1   07/29/2014     10:56:15 AM CARE MANAGEMENT NOTE 07/29/2014  Patient:  PRAISE, DOLECKI   Account Number:  [id number]  Date Initiated:  07/26/2014  Documentation initiated by:  [facility]  Subjective/Objective Assessment:   80 y.o. male h/o diastolic HF, lung cancer s/p chemoradiation, chronic respiratory failure presents to ED from SNF with dyspnea.// From [facility]     Action/Plan:   IV lasix; possible thoracentesis.// Assist with return to [facility].   Anticipated DC Date:  07/28/2014   Anticipated DC Plan:  SKILLED NURSING FACILITY  In-house referral  Clinical Social Worker         Choice offered to / List presented to:  C-1 Patient        [place] arranged  HH-1 RN  [place].   Status of service:  Completed, signed off Medicare Important Message given?  YES (If response is "NO", the following Medicare IM given date fields will be blank) Date Medicare IM given:  07/26/2014 Medicare IM given by:  [facility] Date Additional Medicare IM given:  07/29/2014 Additional Medicare IM given by:  Birgit Nowling  Discharge Disposition:  [place]  Per UR Regulation:  Reviewed for med. necessity/level of care/duration of stay  If discussed at [place] of Stay Meetings, dates discussed:   07/28/2014    Comments:  07/29/14 1000 Sh[address] Brinegar J. Clydene Laming, RN, BSN, [employer] [phone] Spoke with pt at bedside regarding discharge planning for [facility]. Offered pt list of home health agencies to choose from.  Pt chose [facility] to render services. Janae Sauce, RN of [facility] notified.  No DME needs identified at this time.

## 2014-07-26 NOTE — Plan of Care (Signed)
Problem: Phase I Progression Outcomes Goal: EF % per last Echo/documented,Core Reminder form on chart Outcome: Completed/Met Date Met:  07/26/14 EF 50-55% from ECHO done in 2015

## 2014-07-27 ENCOUNTER — Ambulatory Visit: Payer: Medicare Other | Admitting: Nurse Practitioner

## 2014-07-27 ENCOUNTER — Other Ambulatory Visit: Payer: Self-pay | Admitting: *Deleted

## 2014-07-27 DIAGNOSIS — I5033 Acute on chronic diastolic (congestive) heart failure: Secondary | ICD-10-CM | POA: Diagnosis present

## 2014-07-27 DIAGNOSIS — I059 Rheumatic mitral valve disease, unspecified: Secondary | ICD-10-CM

## 2014-07-27 DIAGNOSIS — C3490 Malignant neoplasm of unspecified part of unspecified bronchus or lung: Secondary | ICD-10-CM

## 2014-07-27 DIAGNOSIS — I739 Peripheral vascular disease, unspecified: Secondary | ICD-10-CM

## 2014-07-27 LAB — GLUCOSE, CAPILLARY
GLUCOSE-CAPILLARY: 187 mg/dL — AB (ref 70–99)
Glucose-Capillary: 85 mg/dL (ref 70–99)
Glucose-Capillary: 104 mg/dL — ABNORMAL HIGH (ref 70–99)
Glucose-Capillary: 177 mg/dL — ABNORMAL HIGH (ref 70–99)

## 2014-07-27 NOTE — Consult Note (Addendum)
Patient ID: TAVIO BIEGEL MRN: [id number] DOB/AGE: 02/11/1933 80 y.o.  Admit date: 07/23/2014 Primary Cardiologist: Croituro Reason for Consultation: CHF  HPI: 80 yo male with history of lung cancer, extensive CAD, PAD, chronic diastolic CHF, DM, HTN, HLD, large B cell lymphoma admitted with volume overload. He has a complex history. He has prior right fem-pop bypass and his PAD is followed in [facility]. He is currently being followed for his squamous cell carcinoma of the left lung having completed XRT and chemotherapy. He is known to have extensive CAD. Cardiac catheterization was performed on 04/10/2014 when he presented with acute heart failure. He was found to have 70% ostial left main and 80% distal left main stenosis and lengthy calcified 50% proximal LAD stenosis as well as extensive stenoses in the mid right and distal right coronary artery. Because of the complexity of disease and the lung cancer diagnosis, he was continued on medical therapy. He was not felt to be a candidate for bypass surgery given his advanced lung cancer. He developed brief paroxysmal atrial fibrillation with rapid ventricular response while in the hospital in June 2015 that resolved spontaneously. He was started on metoprolol but it was felt that anticoagulation was not indicated. He was continued on aspirin. His heart failure has been well managed with a low dose of diuretic.   He is now admitted with volume overload on 07/23/14. BNP over 21,000. Pt c/o SOB, leg edema. He has been diuresed with IV Lasix and has had 7 lb weight loss since admission. Diuresis has been limited somewhat by hypotension. Metolazone has been added today. He has no complaints this am. He notes improvement in LE edema. Breathing is better but still dyspneic when moving around. No chest pain.    Past Medical History  Diagnosis Date  . Peripheral arterial disease     s/p Ao Bifem Bypass  . Diabetes mellitus without complication   . CAD  (coronary artery disease)     By  CT Scan  . Hyperlipidemia   . Hypertension   . Non Hodgkin's lymphoma 2000, 2010  . Peripheral neuropathy   . COPD (chronic obstructive pulmonary disease)   . History of lower GI bleeding   . Substance abuse   . Lung cancer, upper lobe     left lung  . AAA (abdominal aortic aneurysm)   . MI (myocardial infarction) 04/10/14    Family History  Problem Relation Age of Onset  . Cancer Mother     Colon  . Heart attack Mother   . Stroke Father   . Hyperlipidemia Father   . Hyperlipidemia Sister   . Hyperlipidemia Brother   . Hyperlipidemia Daughter   . Hypertension Son     History   Social History  . Marital Status: Divorced    Spouse Name: N/A    Number of Children: 2  . Years of Education: N/A   Occupational History  . retired Administrator    Social History Main Topics  . Smoking status: Former Smoker -- 1.00 packs/day for 50 years    Types: Cigarettes    Quit date: 10/29/2003  . Smokeless tobacco: Never Used  . Alcohol Use: No  . Drug Use: No  . Sexual Activity: No   Other Topics Concern  . Not on file   Social History Narrative  . No narrative on file    Past Surgical History  Procedure Laterality Date  . Fracture surgery  2009    Left  ankle  . Pr vein bypass graft,aorto-fem-pop  2005    Right common femoral to BK popliteal BPG  . Pr vein bypass graft,aorto-fem-pop  01-27-05    Revision of Right fem-pop BPG  . Cardiac catheterization  1990's  . Video bronchoscopy Bilateral 03/14/2014    Procedure: VIDEO BRONCHOSCOPY WITH FLUORO;  Surgeon: Rigoberto Noel, MD;  Location: [facility];  Service: Cardiopulmonary;  Laterality: Bilateral;  . Nm myoview ltd  11/2013    Lexiscan.  EF 60%, small fixed inferoapical defect - ? artifact  . Transthoracic echocardiogram  11/2013    EF 60-65%, Gr 1 DD, Mod AS, Sever MAC - no MS.       No Known Allergies  Hospital Medications:  . amiodarone  200 mg Oral BID  . antiseptic oral rinse  7  mL Mouth Rinse BID  . aspirin  81 mg Oral BID  . atorvastatin  40 mg Oral Daily  . Chlorhexidine Gluconate Cloth  6 each Topical Q0600  . citalopram  20 mg Oral Daily  . docusate sodium  100 mg Oral BID  . enoxaparin (LOVENOX) injection  40 mg Subcutaneous Q24H  . finasteride  5 mg Oral Daily  . furosemide  40 mg Intravenous Daily  . gabapentin  600 mg Oral BID  . insulin aspart  0-9 Units Subcutaneous TID WC  . insulin glargine  22 Units Subcutaneous QHS  . linagliptin  5 mg Oral Q supper  . metolazone  2.5 mg Oral Daily  . mupirocin ointment  1 application Nasal BID  . pantoprazole  40 mg Oral Daily  . potassium chloride  40 mEq Oral Daily  . sodium chloride  3 mL Intravenous Q12H  . sucralfate  1 g Oral QID  . tamsulosin  0.4 mg Oral QPC supper    Review of systems complete and found to be negative unless listed above    Physical Exam: Blood pressure 85/59, pulse 105, temperature 97.5 F (36.4 C), temperature source Oral, resp. rate 14, height 5' 11.5" (1.816 m), weight 217 lb 6.4 oz (98.612 kg), SpO2 100.00%.    General: Well developed, well nourished, NAD  HEENT: OP clear, mucus membranes moist  SKIN: warm, dry. No rashes.  Neuro: No focal deficits  Musculoskeletal: Muscle strength 5/5 all ext  Psychiatric: Mood and affect normal  Neck: + JVD, no carotid bruits, no thyromegaly, no lymphadenopathy.  Lungs:Clear bilaterally, no wheezes, rhonci, crackles  Cardiovascular: Regular rate and rhythm. Systolic murmur noted. No gallops or rubs.  Abdomen:Soft. Bowel sounds present. Non-tender.  Extremities: 1-2+ bilateral lower extremity edema.    Labs:   Lab Results  Component Value Date   WBC 3.5* 07/23/2014   HGB 9.5* 07/23/2014   HCT 29.7* 07/23/2014   MCV 92.8 07/23/2014   PLT 146* 07/23/2014    Recent Labs Lab 07/23/14 0955  07/26/14 0601  NA 136*  < > 139  K 3.8  < > 3.6*  CL 95*  < > 95*  CO2 31  < > 35*  BUN 13  < > 18  CREATININE 0.90  < > 1.03  CALCIUM  8.6  < > 8.4  PROT 5.8*  --   --   BILITOT 0.7  --   --   ALKPHOS 39  --   --   ALT 19  --   --   AST 23  --   --   GLUCOSE 136*  < > 88  < > = values in  this interval not displayed. Lab Results  Component Value Date   TROPONINI <0.30 07/23/2014    Echo 04/11/14:  Left ventricle: The cavity size was normal. Wall thickness was increased in a pattern of mild LVH. Systolic function was normal. The estimated ejection fraction was in the range of 50% to 55%. Wall motion was normal; there were no regional wall motion abnormalities. Doppler parameters are consistent with abnormal left ventricular relaxation (grade 1 diastolic dysfunction). - Aortic valve: There was mild stenosis. Valve area (VTI): 1.84 cm^2. Valve area (Vmax): 1.61 cm^2. - Mitral valve: Calcified annulus. There was mild regurgitation.   Chest x-ray 07/26/14: Persistent left effusion and generalized interstitial edema. Left  upper lobe mass with volume loss persists. Masses in the right lower  lobe seen on recent CT are not well seen by radiography. No change  in cardiac silhouette. No pneumothorax.  EKG: 07/24/14: Sinus, IVCD, PAC  ASSESSMENT AND PLAN:   1. Acute on chronic diastolic CHF: He is diuresing with IV Lasix but hypotension is limiting diuresis. He is still volume overloaded. Agree with addition of metolazone today. We will follow with you and make further recommendations tomorrow. May need to consider further evaluation of left pleural effusion, consider thoracentesis. Echo pending today (post chemotherapy).  2. CAD: Known to have severe left main disease. No good options for PCI or CABG. He is having no chest pain. Troponin negative. Continue medical management.   3. DM: Per primary team  4. HTN: He has been hypotensive with diuresis. Nitrates and beta blocker on hold.   5. Hyperlipidemia: Continue statin  6. Squamous cell lung cancer: Per oncology  7. PAD:  stable   Signed: Curry Dulski 07/27/2014, 9:43 AM

## 2014-07-27 NOTE — Progress Notes (Signed)
[facility] PROGRESS NOTE Interim History: 80 y.o. male With h/o diastolic HF, lung cancer s/p chemoradiation, chronic respiratory failure presents to ED from SNF with dyspnea starting last night. Had recent hospitalization at [facility] for CHF exacerbation, then went to SNF at [facility]. Weight 224 lbs, up from 195 lbs at discharge in July. CXR shows left pleural effusion and vascular congestion. Also with increase LE edema.   Filed Weights   07/26/14 0700 07/26/14 0951 07/27/14 0537  Weight: 101.5 kg (223 lb 12.3 oz) 97.8 kg (215 lb 9.8 oz) 98.612 kg (217 lb 6.4 oz)        Intake/Output Summary (Last 24 hours) at 07/27/14 1612 Last data filed at 07/27/14 1025  Gross per 24 hour  Intake    243 ml  Output    550 ml  Net   -307 ml    Assessment/Plan: 1-Acute on chronic diastolic congestive heart failure:  -continue daily weights, low sodium diet and strict intake and output -continue IV lasix 40mg  daily and metolazone  -continue TED hoses -already on b-blockers (holding it due to acute exacerbation and low BP) and nitrates (holding due to low BP) -BP is borderline and at times in the 80s limiting diureses. Will consult with cardiology to help manage diureses with patient's borderline hypotension.  -dry weight 195-196.  2-acute on chronic resp failure: due to #1. Treatment as mentioned above  3-COPD (chronic obstructive pulmonary disease): stable. -continue home medication regimen and oxygen supplementation (2-3L at baseline) -no wheezing  4-Squamous cell carcinoma lung: continue follow up with oncology as an outpatient  5-CAD: troponin are neg -no CP -continue ASA and statins -once BP improved will resume coreg, continue holding for now  6-DM (diabetes mellitus) type II controlled, neurological manifestation: continue SSI, lantus and linagliptin  -continue neurontin  7-HLD: continue statins  8-BPH: continue proscar and flomax.  9-depression:  continue celexa.  10-GERD: continue PPI and sucralfate     Code Status: Partial (DNI) Family Communication: no family at bedside Disposition Plan: back to SNF for rehab once CHF is compensated.    Consultants:  None   Procedures: ECHO: 04/11/14 - Left ventricle: The cavity size was normal. Wall thickness was increased in a pattern of mild LVH. Systolic function was normal. The estimated ejection fraction was in the range of 50% to 55%. Wall motion was normal; there were no regional wall motion abnormalities. Doppler parameters are consistent with abnormal left ventricular relaxation (grade 1 diastolic dysfunction). - Aortic valve: There was mild stenosis. Valve area (VTI): 1.84 cm^2. Valve area (Vmax): 1.61 cm^2. - Mitral valve: Calcified annulus. There was mild regurgitation.  Antibiotics:  None   HPI/Subjective: Patient states shortness of breath has improved. No chest pain.  Objective: Filed Vitals:   07/27/14 0537 07/27/14 0700 07/27/14 1419 07/27/14 1504  BP: 87/51 85/59 92/62  88/60  Pulse: 95 105 95   Temp: 97.8 F (36.6 C) 97.5 F (36.4 C) 98.5 F (36.9 C)   TempSrc: Oral Oral Oral   Resp: 20 14    Height:      Weight: 98.612 kg (217 lb 6.4 oz)     SpO2: 99% 100% 99%      Exam: General: Alert, awake, oriented x3, in no acute distress.  HEENT: No bruits, no goiter. Mild JVD Heart: Regular rate; positive SEM, no rubs or gallops. 2+ LE edema bilaterally Lungs: decreased BS bibasilar and fine crackles; no wheezing Abdomen: Soft, nontender, nondistended, positive bowel sounds.  Neuro:  Grossly intact, nonfocal.   Data Reviewed: Basic Metabolic Panel:  Recent Labs Lab 07/23/14 0955 07/24/14 0434 07/25/14 0535 07/26/14 0601  NA 136* 138 139 139  K 3.8 3.8 3.5* 3.6*  CL 95* 95* 97 95*  CO2 31 33* 34* 35*  GLUCOSE 136* 62* 61* 88  BUN 13 16 18 18   CREATININE 0.90 1.08 1.08 1.03  CALCIUM 8.6 8.7 8.7 8.4   Liver Function Tests:  Recent  Labs Lab 07/23/14 0955  AST 23  ALT 19  ALKPHOS 39  BILITOT 0.7  PROT 5.8*  ALBUMIN 2.6*   CBC:  Recent Labs Lab 07/23/14 0955  WBC 3.5*  NEUTROABS 2.2  HGB 9.5*  HCT 29.7*  MCV 92.8  PLT 146*   Cardiac Enzymes:  Recent Labs Lab 07/23/14 0955  TROPONINI <0.30   BNP (last 3 results)  Recent Labs  04/10/14 1145 05/13/14 2240 07/23/14 0955  PROBNP 5244.0* 1118.0* 21437.0*   CBG:  Recent Labs Lab 07/26/14 1205 07/26/14 1558 07/26/14 2101 07/27/14 0546 07/27/14 1118  GLUCAP 141* 207* 104* 85 104*    Recent Results (from the past 240 hour(s))  MRSA PCR SCREENING     Status: Abnormal   Collection Time    07/23/14  8:17 PM      Result Value Ref Range Status   MRSA by PCR POSITIVE (*) NEGATIVE Final   Comment:            The GeneXpert MRSA Assay (FDA     approved for NASAL specimens     only), is one component of a     comprehensive MRSA colonization     surveillance program. It is not     intended to diagnose MRSA     infection nor to guide or     monitor treatment for     MRSA infections.     RESULT CALLED TO, READ BACK BY AND VERIFIED WITH:     HARAWAY,J RN [id number] AT 2235 ON [id number] SKEEN,P     Studies: Dg Chest 2 View  07/26/2014   CLINICAL DATA:  Difficulty breathing; history of lung carcinoma  EXAM: CHEST  2 VIEW  COMPARISON:  July 23, 2014 chest radiograph and chest CT July 11, 2004  FINDINGS: Port-A-Cath tip is in the superior vena cava. No pneumothorax. The previously noted left upper lobe mass with volume loss persists without appreciable change. There is a moderate effusion on the left. Elsewhere there is generalized interstitial edema. Masses in the right lower lobe seen on recent CT are not well seen by radiography. The heart size is normal. Pulmonary vascularity is within normal limits. There is atherosclerotic change in the aorta. No bone lesions.  IMPRESSION: Persistent left effusion and generalized interstitial edema. Left upper  lobe mass with volume loss persists. Masses in the right lower lobe seen on recent CT are not well seen by radiography. No change in cardiac silhouette. No pneumothorax.   Electronically Signed   By: Lowella Grip M.D.   On: 07/26/2014 09:01    Scheduled Meds: . amiodarone  200 mg Oral BID  . antiseptic oral rinse  7 mL Mouth Rinse BID  . aspirin  81 mg Oral BID  . atorvastatin  40 mg Oral Daily  . Chlorhexidine Gluconate Cloth  6 each Topical Q0600  . citalopram  20 mg Oral Daily  . docusate sodium  100 mg Oral BID  . enoxaparin (LOVENOX) injection  40 mg Subcutaneous Q24H  . finasteride  5  mg Oral Daily  . furosemide  40 mg Intravenous Daily  . gabapentin  600 mg Oral BID  . insulin aspart  0-9 Units Subcutaneous TID WC  . insulin glargine  22 Units Subcutaneous QHS  . linagliptin  5 mg Oral Q supper  . metolazone  2.5 mg Oral Daily  . mupirocin ointment  1 application Nasal BID  . pantoprazole  40 mg Oral Daily  . potassium chloride  40 mEq Oral Daily  . sodium chloride  3 mL Intravenous Q12H  . sucralfate  1 g Oral QID  . tamsulosin  0.4 mg Oral QPC supper   Time: > 35 minutes   Sherline Eberwein MD [facility] Pager [phone]. If 8PM-8AM, please contact night-coverage at [email or web address], password [facility] 07/27/2014, 4:12 PM  LOS: 4 days

## 2014-07-27 NOTE — Progress Notes (Signed)
  Echocardiogram 2D Echocardiogram has been performed.  [name] [name] 07/27/2014, 5:49 PM

## 2014-07-27 NOTE — Progress Notes (Signed)
Bp 95/53 this am. Lasix iv held as per Dr Grandville Silos. Bp remains low- checked manually -88/60. Spoke with Dr Angelena Form. Ordered to give Lasix if bp above 80 systolic. Pt voices no complaints. Lasix 40 mg iv given at 16:10

## 2014-07-27 NOTE — Progress Notes (Signed)
Physical Therapy Treatment Patient Details Name: [name] [name] MRN: [id number] DOB: 18-Jul-1933 Today's Date: 07/27/2014    History of Present Illness With h/o diastolic HF, lung cancer s/p chemoradiation, chronic respiratory failure presents to ED from SNF with dyspnea starting last night. Had recent hospitalization at [facility] for CHF exacerbation, then went to SNF at [facility]. Weight 224 lbs, up from 193 lbs in July.  CXR shows left pleural effusion. Present on CT scan earlier this month (with interval decrease in size of left upper lobe perihilar/perimediastinal mass compatible with response to therapy). proBNP 21,000 (1000 in July). Echo 6/15 EF 34-74%, grade 1 diastolic dysfunction. C/o worsening leg edema.    PT Comments    Pt moving well despite c/o fatigue and needing to use RW.  O2 sats remained >90% on 3L O2 during first bout of ambulation, but decreased to mid 80's after 2nd bout of ambulation.  Will continue to follow.    Follow Up Recommendations  Home health PT;Supervision - Intermittent     Equipment Recommendations  None recommended by PT    Recommendations for Other Services OT consult (Energy Conservation)     Precautions / Restrictions Precautions Precautions: None Precaution Comments: monitor 02 sats Restrictions Weight Bearing Restrictions: No    Mobility  Bed Mobility                  Transfers Overall transfer level: Modified independent Equipment used: None                Ambulation/Gait Ambulation/Gait assistance: Modified independent (Device/Increase time) Ambulation Distance (Feet): 90 Feet (x2) Assistive device: Rolling walker (2 wheeled) Gait Pattern/deviations: Step-through pattern;Decreased stride length     General Gait Details: pt utilized RW today 2/2 feelig tired today.  pt able to take a standing rest break in between bouts of gait.     Stairs            Wheelchair Mobility    Modified  Rankin (Stroke Patients Only)       Balance                                    Cognition Arousal/Alertness: Awake/alert Behavior During Therapy: WFL for tasks assessed/performed Overall Cognitive Status: Within Functional Limits for tasks assessed                      Exercises      General Comments        Pertinent Vitals/Pain Pain Assessment: No/denies pain    Home Living                      Prior Function            PT Goals (current goals can now be found in the care plan section) Acute Rehab PT Goals PT Goal Formulation: With patient Time For Goal Achievement: 08/07/14 Potential to Achieve Goals: Good Progress towards PT goals: Progressing toward goals    Frequency  Min 3X/week    PT Plan Current plan remains appropriate    Co-evaluation             End of Session Equipment Utilized During Treatment: Oxygen Activity Tolerance: Patient tolerated treatment well Patient left: in bed;with call bell/phone within reach (sitting EOB)     Time: 1040-1107 PT Time Calculation (min): 27 min  Charges:  $Gait Training:  23-37 mins                    G CodesCatarina Hartshorn, [place] 07/27/2014, 12:14 PM

## 2014-07-28 DIAGNOSIS — J918 Pleural effusion in other conditions classified elsewhere: Secondary | ICD-10-CM

## 2014-07-28 DIAGNOSIS — I480 Paroxysmal atrial fibrillation: Secondary | ICD-10-CM

## 2014-07-28 DIAGNOSIS — I5030 Unspecified diastolic (congestive) heart failure: Secondary | ICD-10-CM

## 2014-07-28 DIAGNOSIS — C3412 Malignant neoplasm of upper lobe, left bronchus or lung: Secondary | ICD-10-CM

## 2014-07-28 DIAGNOSIS — I5033 Acute on chronic diastolic (congestive) heart failure: Secondary | ICD-10-CM

## 2014-07-28 DIAGNOSIS — I2510 Atherosclerotic heart disease of native coronary artery without angina pectoris: Secondary | ICD-10-CM

## 2014-07-28 DIAGNOSIS — I501 Left ventricular failure: Secondary | ICD-10-CM

## 2014-07-28 DIAGNOSIS — I5043 Acute on chronic combined systolic (congestive) and diastolic (congestive) heart failure: Principal | ICD-10-CM

## 2014-07-28 DIAGNOSIS — R0600 Dyspnea, unspecified: Secondary | ICD-10-CM

## 2014-07-28 DIAGNOSIS — E1165 Type 2 diabetes mellitus with hyperglycemia: Secondary | ICD-10-CM

## 2014-07-28 LAB — CBC
HCT: 30 % — ABNORMAL LOW (ref 39.0–52.0)
HEMOGLOBIN: 9.5 g/dL — AB (ref 13.0–17.0)
MCH: 30.3 pg (ref 26.0–34.0)
MCHC: 31.7 g/dL (ref 30.0–36.0)
MCV: 95.5 fL (ref 78.0–100.0)
Platelets: 143 10*3/uL — ABNORMAL LOW (ref 150–400)
RBC: 3.14 MIL/uL — AB (ref 4.22–5.81)
RDW: 17 % — ABNORMAL HIGH (ref 11.5–15.5)
WBC: 3.2 10*3/uL — AB (ref 4.0–10.5)

## 2014-07-28 LAB — BASIC METABOLIC PANEL
Anion gap: 9 (ref 5–15)
BUN: 14 mg/dL (ref 6–23)
CHLORIDE: 93 meq/L — AB (ref 96–112)
CO2: 35 meq/L — AB (ref 19–32)
Calcium: 8.6 mg/dL (ref 8.4–10.5)
Creatinine, Ser: 1.04 mg/dL (ref 0.50–1.35)
GFR calc Af Amer: 77 mL/min — ABNORMAL LOW (ref >90)
GFR calc non Af Amer: 66 mL/min — ABNORMAL LOW (ref >90)
GLUCOSE: 91 mg/dL (ref 70–99)
POTASSIUM: 3.6 meq/L — AB (ref 3.7–5.3)
Sodium: 137 mEq/L (ref 137–147)

## 2014-07-28 LAB — GLUCOSE, CAPILLARY
GLUCOSE-CAPILLARY: 58 mg/dL — AB (ref 70–99)
Glucose-Capillary: 76 mg/dL (ref 70–99)
Glucose-Capillary: 178 mg/dL — ABNORMAL HIGH (ref 70–99)
Glucose-Capillary: 148 mg/dL — ABNORMAL HIGH (ref 70–99)
Glucose-Capillary: 174 mg/dL — ABNORMAL HIGH (ref 70–99)

## 2014-07-28 NOTE — Progress Notes (Signed)
[facility] PROGRESS NOTE Interim History: 80 y.o. male With h/o diastolic HF, lung cancer s/p chemoradiation, chronic respiratory failure presents to ED from SNF with dyspnea starting last night. Had recent hospitalization at [facility] for CHF exacerbation, then went to SNF at [facility]. Weight 224 lbs, up from 195 lbs at discharge in July. CXR shows left pleural effusion and vascular congestion. Also with increase LE edema.   Filed Weights   07/26/14 0951 07/27/14 0537 07/28/14 0540  Weight: 97.8 kg (215 lb 9.8 oz) 98.612 kg (217 lb 6.4 oz) 96.707 kg (213 lb 3.2 oz)        Intake/Output Summary (Last 24 hours) at 07/28/14 1322 Last data filed at 07/28/14 1229  Gross per 24 hour  Intake    480 ml  Output   1625 ml  Net  -1145 ml    Assessment/Plan: Acute on chronic diastolic congestive heart failure:  - continue daily weights, low sodium diet and strict intake and output - continue IV lasix 40mg  daily and metolazone  - b-blockers (holding it due to acute exacerbation and low BP) and nitrates (holding due to low BP) - Cardiology on board still not to dry weight. -dry weight 195-196.  Acute on chronic resp failure:  - due to #1. Treatment as mentioned above  COPD (chronic obstructive pulmonary disease):  - stable. -continue home medication regimen and oxygen supplementation (2-3L at baseline)  Squamous cell carcinoma lung: continue follow up with oncology as an outpatient  CAD:  - troponin are neg -no CP -continue ASA and statins -once BP improved will resume coreg, continue holding for now  DM (diabetes mellitus) type II controlled,  - neurological manifestation: continue SSI, lantus and linagliptin  -continue neurontin  HLD:  - continue statins  BPH:  - continue proscar and flomax.  Depression:  - continue celexa.  GERD:  - continue PPI and sucralfate     Code Status: Partial (DNI) Family Communication: no family at  bedside Disposition Plan: back to SNF for rehab once CHF is compensated.    Consultants:  None   Procedures: ECHO: 04/11/14 - Left ventricle: The cavity size was normal. Wall thickness was increased in a pattern of mild LVH. Systolic function was normal. The estimated ejection fraction was in the range of 50% to 55%. Wall motion was normal; there were no regional wall motion abnormalities. Doppler parameters are consistent with abnormal left ventricular relaxation (grade 1 diastolic dysfunction). - Aortic valve: There was mild stenosis. Valve area (VTI): 1.84 cm^2. Valve area (Vmax): 1.61 cm^2. - Mitral valve: Calcified annulus. There was mild regurgitation.  Antibiotics:  None   HPI/Subjective: Patient states shortness of breath has improved.   Objective: Filed Vitals:   07/28/14 0540 07/28/14 0743 07/28/14 1015 07/28/14 1058  BP: 93/57 96/62 92/58  102/63  Pulse: 90 88 93 93  Temp: 98 F (36.7 C) 98 F (36.7 C)  97.3 F (36.3 C)  TempSrc: Oral Oral  Oral  Resp: 17 16  18   Height:      Weight: 96.707 kg (213 lb 3.2 oz)     SpO2: 94% 98%       Exam: General: Alert, awake, oriented x3, in no acute distress.  HEENT: No bruits, no goiter. Mild JVD Heart: Regular rate; positive SEM, no rubs or gallops. 2+ LE edema bilaterally Lungs: decreased BS bibasilar and fine crackles; no wheezing Abdomen: Soft, nontender, nondistended, positive bowel sounds.   Data Reviewed: Basic Metabolic Panel:  Recent  Labs Lab 07/23/14 0955 07/24/14 0434 07/25/14 0535 07/26/14 0601 07/28/14 0640  NA 136* 138 139 139 137  K 3.8 3.8 3.5* 3.6* 3.6*  CL 95* 95* 97 95* 93*  CO2 31 33* 34* 35* 35*  GLUCOSE 136* 62* 61* 88 91  BUN 13 16 18 18 14   CREATININE 0.90 1.08 1.08 1.03 1.04  CALCIUM 8.6 8.7 8.7 8.4 8.6   Liver Function Tests:  Recent Labs Lab 07/23/14 0955  AST 23  ALT 19  ALKPHOS 39  BILITOT 0.7  PROT 5.8*  ALBUMIN 2.6*   CBC:  Recent Labs Lab 07/23/14 0955  07/28/14 0640  WBC 3.5* 3.2*  NEUTROABS 2.2  --   HGB 9.5* 9.5*  HCT 29.7* 30.0*  MCV 92.8 95.5  PLT 146* 143*   Cardiac Enzymes:  Recent Labs Lab 07/23/14 0955  TROPONINI <0.30   BNP (last 3 results)  Recent Labs  04/10/14 1145 05/13/14 2240 07/23/14 0955  PROBNP 5244.0* 1118.0* 21437.0*   CBG:  Recent Labs Lab 07/27/14 1650 07/27/14 2230 07/28/14 0608 07/28/14 0628 07/28/14 1227  GLUCAP 177* 187* 58* 76 178*    Recent Results (from the past 240 hour(s))  MRSA PCR SCREENING     Status: Abnormal   Collection Time    07/23/14  8:17 PM      Result Value Ref Range Status   MRSA by PCR POSITIVE (*) NEGATIVE Final   Comment:            The GeneXpert MRSA Assay (FDA     approved for NASAL specimens     only), is one component of a     comprehensive MRSA colonization     surveillance program. It is not     intended to diagnose MRSA     infection nor to guide or     monitor treatment for     MRSA infections.     RESULT CALLED TO, READ BACK BY AND VERIFIED WITH:     HARAWAY,J RN [id number] AT 2235 ON [id number] SKEEN,P     Studies: No results found.  Scheduled Meds: . amiodarone  200 mg Oral BID  . antiseptic oral rinse  7 mL Mouth Rinse BID  . aspirin  81 mg Oral BID  . atorvastatin  40 mg Oral Daily  . Chlorhexidine Gluconate Cloth  6 each Topical Q0600  . citalopram  20 mg Oral Daily  . docusate sodium  100 mg Oral BID  . enoxaparin (LOVENOX) injection  40 mg Subcutaneous Q24H  . finasteride  5 mg Oral Daily  . furosemide  40 mg Intravenous Daily  . gabapentin  600 mg Oral BID  . insulin aspart  0-9 Units Subcutaneous TID WC  . insulin glargine  22 Units Subcutaneous QHS  . linagliptin  5 mg Oral Q supper  . metolazone  2.5 mg Oral Daily  . pantoprazole  40 mg Oral Daily  . potassium chloride  40 mEq Oral Daily  . sodium chloride  3 mL Intravenous Q12H  . sucralfate  1 g Oral QID  . tamsulosin  0.4 mg Oral QPC supper   Time: > 35 minutes   Charlynne Cousins MD [facility] Pager [phone] If 8PM-8AM, please contact night-coverage at [email or web address], password [facility] 07/28/2014, 1:22 PM  LOS: 5 days

## 2014-07-28 NOTE — Progress Notes (Signed)
Inpatient Diabetes Program Recommendations  AACE/ADA: New Consensus Statement on Inpatient Glycemic Control (2013)  Target Ranges:  Prepandial:   less than 140 mg/dL      Peak postprandial:   less than 180 mg/dL (1-2 hours)      Critically ill patients:  140 - 180 mg/dL  Results for YUYA, VANWINGERDEN (MRN [id number]) as of 07/28/2014 10:20  Ref. Range 07/27/2014 05:46 07/27/2014 11:18 07/27/2014 16:50 07/27/2014 22:30 07/28/2014 06:08 07/28/2014 06:28  Glucose-Capillary Latest Range: 70-99 mg/dL 85 104 (H) 177 (H) 187 (H) 58 (L) 76   Inpatient Diabetes Program Recommendations Insulin - Basal: consider reduction of Lantus to 19 units  Thank you  Raoul Pitch BSN, RN,CDE Inpatient Diabetes Coordinator [phone] (team pager)

## 2014-07-28 NOTE — Progress Notes (Signed)
Patient Name: [name] [name] Date of Encounter: 07/28/2014     Principal Problem:   Acute on chronic diastolic congestive heart failure Active Problems:   DM (diabetes mellitus) type II controlled, neurological manifestation   CAD   Squamous cell carcinoma lung   COPD (chronic obstructive pulmonary disease)   Chronic respiratory failure   Acute on chronic diastolic CHF (congestive heart failure), NYHA class 4    SUBJECTIVE  No CP, only mild SOB  CURRENT MEDS . amiodarone  200 mg Oral BID  . antiseptic oral rinse  7 mL Mouth Rinse BID  . aspirin  81 mg Oral BID  . atorvastatin  40 mg Oral Daily  . Chlorhexidine Gluconate Cloth  6 each Topical Q0600  . citalopram  20 mg Oral Daily  . docusate sodium  100 mg Oral BID  . enoxaparin (LOVENOX) injection  40 mg Subcutaneous Q24H  . finasteride  5 mg Oral Daily  . furosemide  40 mg Intravenous Daily  . gabapentin  600 mg Oral BID  . insulin aspart  0-9 Units Subcutaneous TID WC  . insulin glargine  22 Units Subcutaneous QHS  . linagliptin  5 mg Oral Q supper  . metolazone  2.5 mg Oral Daily  . mupirocin ointment  1 application Nasal BID  . pantoprazole  40 mg Oral Daily  . potassium chloride  40 mEq Oral Daily  . sodium chloride  3 mL Intravenous Q12H  . sucralfate  1 g Oral QID  . tamsulosin  0.4 mg Oral QPC supper    OBJECTIVE  Filed Vitals:   07/27/14 1504 07/27/14 2233 07/27/14 2254 07/28/14 0540  BP: 88/60 89/51 91/50  93/57  Pulse:  91  90  Temp:  97.9 F (36.6 C)  98 F (36.7 C)  TempSrc:  Oral  Oral  Resp:  16  17  Height:      Weight:    213 lb 3.2 oz (96.707 kg)  SpO2:  98%  94%    Intake/Output Summary (Last 24 hours) at 07/28/14 0749 Last data filed at 07/28/14 0613  Gross per 24 hour  Intake    483 ml  Output   1105 ml  Net   -622 ml   Filed Weights   07/26/14 0951 07/27/14 0537 07/28/14 0540  Weight: 215 lb 9.8 oz (97.8 kg) 217 lb 6.4 oz (98.612 kg) 213 lb 3.2 oz (96.707 kg)    PHYSICAL  EXAM  General: Pleasant, NAD. Neuro: Alert and oriented X 3. Moves all extremities spontaneously. Psych: Normal affect. HEENT:  Normal  Neck: Supple without bruits or JVD. Lungs:  Resp regular and unlabored, L lung base move air suprisingly well, mild decrease in air movement in R base.  Heart: RRR no s3, s4, or murmurs. Abdomen: Soft, non-tender, non-distended, BS + x 4.  Extremities: No clubbing, cyanosis. DP/PT/Radials 2+ and equal bilaterally. 1+ pitting edema in LE with compression stocking.   Accessory Clinical Findings  CBC  Recent Labs  07/28/14 0640  WBC 3.2*  HGB 9.5*  HCT 30.0*  MCV 95.5  PLT 127*   Basic Metabolic Panel  Recent Labs  07/26/14 0601 07/28/14 0640  NA 139 137  K 3.6* 3.6*  CL 95* 93*  CO2 35* 35*  GLUCOSE 88 91  BUN 18 14  CREATININE 1.03 1.04  CALCIUM 8.4 8.6    TELE NSR with HR 80s, occasional PACs    ECG  No new EKG  Echocardiogram 04/11/2014  LV  EF: 50% - 55%  ------------------------------------------------------------------- Indications: CHF - 428.0.  ------------------------------------------------------------------- History: PMH: Coronary artery disease. Aortic valve disease. Chronic obstructive pulmonary disease. Risk factors: Lung Cancer. Former tobacco use. Hypertension. Diabetes mellitus.  ------------------------------------------------------------------- Study Conclusions  - Left ventricle: The cavity size was normal. Wall thickness was increased in a pattern of mild LVH. Systolic function was normal. The estimated ejection fraction was in the range of 50% to 55%. Wall motion was normal; there were no regional wall motion abnormalities. Doppler parameters are consistent with abnormal left ventricular relaxation (grade 1 diastolic dysfunction). - Aortic valve: There was mild stenosis. Valve area (VTI): 1.84 cm^2. Valve area (Vmax): 1.61 cm^2. - Mitral valve: Calcified annulus. There was mild  regurgitation.      Radiology/Studies  Dg Chest 2 View  07/26/2014   CLINICAL DATA:  Difficulty breathing; history of lung carcinoma  EXAM: CHEST  2 VIEW  COMPARISON:  July 23, 2014 chest radiograph and chest CT July 11, 2004  FINDINGS: Port-A-Cath tip is in the superior vena cava. No pneumothorax. The previously noted left upper lobe mass with volume loss persists without appreciable change. There is a moderate effusion on the left. Elsewhere there is generalized interstitial edema. Masses in the right lower lobe seen on recent CT are not well seen by radiography. The heart size is normal. Pulmonary vascularity is within normal limits. There is atherosclerotic change in the aorta. No bone lesions.  IMPRESSION: Persistent left effusion and generalized interstitial edema. Left upper lobe mass with volume loss persists. Masses in the right lower lobe seen on recent CT are not well seen by radiography. No change in cardiac silhouette. No pneumothorax.   Electronically Signed   By: Lowella Grip M.D.   On: 07/26/2014 09:01   Dg Chest 2 View  07/23/2014   CLINICAL DATA:  Shortness of breath.  EXAM: CHEST  2 VIEW  COMPARISON:  CT 07/11/2014.  Chest x-ray  06/25/2014.  FINDINGS: Prior port catheter in good anatomic position. Mediastinum and stable. Heart size stable. Left upper lobe mass with left upper lobe atelectasis again noted. Left-sided pleural effusion again noted. Small right pleural effusion. No acute bony abnormality.  IMPRESSION: 1. Left upper lobe mass with left upper lobe atelectasis appears stable. 2. Prominent left pleural effusion.  Small right pleural effusion. 3. Power port catheter in stable position.   Electronically Signed   By: Marcello Moores  Register   On: 07/23/2014 10:47   Ct Chest W Contrast  07/11/2014   CLINICAL DATA:  Followup lung cancer.  EXAM: CT CHEST WITH CONTRAST  TECHNIQUE: Multidetector CT imaging of the chest was performed during intravenous contrast  administration.  CONTRAST:  78mL OMNIPAQUE IOHEXOL 300 MG/ML  SOLN  COMPARISON:  03/14/2014  FINDINGS: Mediastinum: There is moderate cardiac enlargement. Calcified atherosclerotic disease involves the thoracic aorta as well as the left main coronary artery, LAD, left circumflex and RCA coronary arteries. No pericardial effusions. No right paratracheal, sub- carinal or right hilar adenopathy identified.  Lungs/Pleura: There is a moderate left pleural effusion which is new from previous exam. Small right pleural effusion is also new from the previous study. Loculated fluid is identified along the oblique fissure of the right lung. The left upper lobe paramediastinal mass measures 3.6 x 8.8 x 8.8 cm. Previously this measured 6.6 x 10.2 x 9.0 cm.  Upper Abdomen: The visualized portions of the adrenal glands are normal. The visualized portions of the spleen and pancreas are also unremarkable. There is a stone within  the gallbladder which measures 1.7 cm. No focal liver abnormality identified.  Musculoskeletal: Review of the visualized bony structures shows no aggressive lytic or sclerotic bone lesion.  IMPRESSION: 1. Interval decrease in size of left upper lobe perihilar/perimediastinal mass compatible with response to therapy.  2. Bilateral pleural effusions, left greater than right. New from previous exam.  3. Atherosclerotic disease including multi vessel coronary artery calcifications.   Electronically Signed   By: Kerby Moors M.D.   On: 07/11/2014 17:16    ASSESSMENT AND PLAN  1. Acute on chronic diastolic HF  - proBNP > 57017, diuresis limited by hypotension  - echo 04/11/2014 EF 79-39%, grade 1 diastolic dysfunction, mild MR  - pending post chemo echo  - continue IV lasix with metolazone  2. Moderate L pleural effusion  - LLL move air surprising well on physical exam  3. CAD  - cath 04/10/2014 70% ostial L main, 80% distal L main, 50% prox LAD, stenosis in Mid and distal RCA  - not a candidate  for CABG given advanced lung cancer  - no CP, troponin negative, continue medical therapy  4. DM 5. HTN 6. HLD 7. Squamous cell lung cancer: L lung  - per oncology, s/p XRT and chemo 8. PAD: s/p prior R fem-pop bypass 9. H/o PAF with RVR during previous admittion, spontaneously resolved, on metoprolol, felt anticoagulation not indicated  Signed, Woodward Ku Pager: [phone]

## 2014-07-28 NOTE — Progress Notes (Signed)
Utilization Review Completed Laronda Lisby J. Kellie Chisolm, RN, BSN, NCM [phone]  

## 2014-07-28 NOTE — Progress Notes (Signed)
Hypoglycemic Event  CBG: 58  Treatment: 15 GM carbohydrate snack  Symptoms: Pale  Follow-up CBG: [id number] CBG Result:*76  Possible Reasons for Event: Unknown  Comments/MD notified:per protocol    Khiley Lieser L  Remember to initiate Hypoglycemia Order Set & complete

## 2014-07-28 NOTE — Progress Notes (Signed)
IP PROGRESS NOTE  Subjective:   [name] is well-known to me with a history of non-small cell lung cancer. His family asked me to see him. He was scheduled for an office visit at the [facility] 07/27/2014. He was admitted 07/23/2014 with increased dyspnea. He reports improvement compared to hospital admission. His hoarseness has also improved.  Objective: Vital signs in last 24 hours: Blood pressure 86/50, pulse 86, temperature 97.5 F (36.4 C), temperature source Oral, resp. rate 20, height 5' 11.5" (1.816 m), weight 213 lb 3.2 oz (96.707 kg), SpO2 100.00%.  Intake/Output from previous day: 09/30 0701 - 10/01 0700 In: 483 [P.O.:480; I.V.:3] Out: 1105 [Urine:1105]  Physical Exam:  HEENT: Neck without mass Lungs: Decreased breath sounds at the right lower posterior chest and left upper anterior chest Cardiac: Regular rate and rhythm Abdomen: No hepatomegaly Extremities: 1+ pitting edema below the knee bilaterally     Lab Results:  Recent Labs  07/28/14 0640  WBC 3.2*  HGB 9.5*  HCT 30.0*  PLT 143*    BMET  Recent Labs  07/26/14 0601 07/28/14 0640  NA 139 137  K 3.6* 3.6*  CL 95* 93*  CO2 35* 35*  GLUCOSE 88 91  BUN 18 14  CREATININE 1.03 1.04  CALCIUM 8.4 8.6    Studies/Results: No results found.  Medications: I have reviewed the patient's current medications.  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  Initiation of chest radiation 04/05/2014. Radiation completed 05/30/2014.  Initiation of weekly Taxol/carboplatin chemotherapy 04/18/2014. Final weekly Taxol/carboplatin given 05/30/2014.  Restaging CT 07/11/2014 with a decrease in the left upper lobe mass, new bilateral pleural effusions 2. Admission 04/10/2014 with acute CHF and acute coronary syndrome,  status post a cardiac catheterization 04/10/2014, clinical improvement with medical therapy.  3. COPD  4. Chronic renal failure  5. Diabetes  6. Neuropathy  7. History of non-Hodgkin's lymphoma  8. Atrial fibrillation.  9. Status post evaluation in the emergency department 05/13/2014 for chest pain. Question radiation esophagitis.  10. Oral candidiasis. He completed a 5 day course of Diflucan.  11. History of thrombocytopenia secondary to chemotherapy. The carboplatin was dose reduced on 05/30/2014.  12. Hoarseness-potentially related to recurrent laryngeal nerve involvement with tumor , improved 13. Admission 07/23/2014 with dyspnea and volume overload, likely secondary to congestive heart failure, an echocardiogram revealed severe left ventricular dysfunction  Mr. O'Neal has a history of non-Hodgkin's lymphoma, non-small cell lung cancer, and multiple comorbid conditions. He is now admitted with congestive heart failure. I doubt the recent Taxol and carboplatin chemotherapy caused the heart failure. The pleural effusions are most likely related to heart failure, but it is possible the dominant left effusion is malignant. He underwent chemotherapy/radiation for treatment of lung cancer. It is unlikely this therapy will be curative.  Recommendations: 1. Continue management of CHF/volume per cardiology 2. consider diagnostic/therapeutic left thoracentesis for a persistent large left pleural effusion  3. I agree with consideration of comfort/Hospice care if he is confirmed to have progressive lung cancer or CHF that cannot be adequately managed with medical therapy.    Please call oncology as needed.I will check on him 07/29/2014. We will arrange for outpatient followup.       LOS: 5 days   Ronold Hardgrove  07/28/2014, 5:53 PM

## 2014-07-28 NOTE — Progress Notes (Signed)
Physical Therapy Treatment Patient Details Name: [name] [name] MRN: [id number] DOB: June 28, 1933 Today's Date: 07/28/2014    History of Present Illness With h/o diastolic HF, lung cancer s/p chemoradiation, chronic respiratory failure presents to ED from SNF with dyspnea starting last night. Had recent hospitalization at [facility] for CHF exacerbation, then went to SNF at [facility]. Weight 224 lbs, up from 193 lbs in July.  CXR shows left pleural effusion. Present on CT scan earlier this month (with interval decrease in size of left upper lobe perihilar/perimediastinal mass compatible with response to therapy). proBNP 21,000 (1000 in July). Echo 6/15 EF 09-98%, grade 1 diastolic dysfunction. C/o worsening leg edema.    PT Comments    Pt has met therapy goals and discussed use of RW for energy conservation.  Pt states that he likes to use his rollator, but is unsure if he has it at home still.  Please have case manager check with pt to determine equipment needs.  Will D/C from acute services at this time and work towards [place] at home.   Follow Up Recommendations  Home health PT;Supervision - Intermittent     Equipment Recommendations  None recommended by PT (may need RW, please check with pt before DC)    Recommendations for Other Services       Precautions / Restrictions Precautions Precautions: None Precaution Comments: monitor 02 sats Restrictions Weight Bearing Restrictions: No    Mobility  Bed Mobility               General bed mobility comments: Pt in recliner when PT arrived  Transfers Overall transfer level: Modified independent Equipment used: Rolling walker (2 wheeled)             General transfer comment: no difficulty standing from low bed, no AD  Ambulation/Gait Ambulation/Gait assistance: Modified independent (Device/Increase time) Ambulation Distance (Feet): 300 Feet Assistive device: Rolling walker (2 wheeled) Gait  Pattern/deviations: WFL(Within Functional Limits);Step-through pattern     General Gait Details: Pt used RW for gait today, states he does have rollator at home that he likes to use.  ambulated at mod I level on 3LO2 with SaO2 remaining >90% throughout, but pt did take one standing break.  Educated pt to ambulate with nursing.     Stairs            Wheelchair Mobility    Modified Rankin (Stroke Patients Only)       Balance Overall balance assessment: Modified Independent                                  Cognition Arousal/Alertness: Awake/alert Behavior During Therapy: WFL for tasks assessed/performed Overall Cognitive Status: Within Functional Limits for tasks assessed                      Exercises      General Comments        Pertinent Vitals/Pain      Home Living                      Prior Function            PT Goals (current goals can now be found in the care plan section) Acute Rehab PT Goals PT Goal Formulation: With patient Time For Goal Achievement: 08/07/14 Potential to Achieve Goals: Good Progress towards PT goals: Goals met/education completed, patient discharged from  PT (Discussed using RW for energy conservation, is at mod I leve)    Frequency  Min 3X/week    PT Plan Current plan remains appropriate    Co-evaluation             End of Session Equipment Utilized During Treatment: Oxygen Activity Tolerance: Patient tolerated treatment well Patient left: in chair;with call bell/phone within reach     Time: 0953-1019 PT Time Calculation (min): 26 min  Charges:  $Gait Training: 23-37 mins                    G Codes:      Denice Bors 07/28/2014, 10:23 AM

## 2014-07-28 NOTE — Progress Notes (Signed)
Patient seen and examined and agree with note as outlined by Almyra Deforest, PA-C.  Diuresing well with addition of metolazone.  He he net neg 2L.  Unfortunately, hypotension is limiting diuresis.  2D echo report now back since [facility] note completed and shows severe LV dysfunction EF 25% ? Secondary to chemo for lung CA.  Very difficult situation with baseline hypotension it makes treatment of LV dysfunction very difficult.  Cannot add ACE I or BB at this time due to hypotension.  Given is multiple med problems with advanced lung CA and now worsening CHF in the setting of significantly reduced LVF with treatment limited by hypotension we may be at the point of considering Palliative Care.  He also had severe CAD with severe LM disease and is not a candidate for PCI or CABG.

## 2014-07-29 DIAGNOSIS — I10 Essential (primary) hypertension: Secondary | ICD-10-CM

## 2014-07-29 DIAGNOSIS — I509 Heart failure, unspecified: Secondary | ICD-10-CM

## 2014-07-29 DIAGNOSIS — I350 Nonrheumatic aortic (valve) stenosis: Secondary | ICD-10-CM

## 2014-07-29 LAB — GLUCOSE, CAPILLARY
GLUCOSE-CAPILLARY: 98 mg/dL (ref 70–99)
GLUCOSE-CAPILLARY: 167 mg/dL — AB (ref 70–99)
Glucose-Capillary: 40 mg/dL — CL (ref 70–99)
Glucose-Capillary: 200 mg/dL — ABNORMAL HIGH (ref 70–99)
Glucose-Capillary: 102 mg/dL — ABNORMAL HIGH (ref 70–99)

## 2014-07-29 MED ORDER — POTASSIUM CHLORIDE CRYS ER 20 MEQ PO TBCR
40.0000 meq | EXTENDED_RELEASE_TABLET | Freq: Two times a day (BID) | ORAL | Status: AC
Start: 2014-07-29 — End: 2014-07-29
  Administered 2014-07-29 (×2): 40 meq via ORAL
  Filled 2014-07-29 (×2): qty 2

## 2014-07-29 MED ORDER — FUROSEMIDE 10 MG/ML IJ SOLN
60.0000 mg | Freq: Four times a day (QID) | INTRAMUSCULAR | Status: DC
  Administered 2014-07-29 – 2014-07-31 (×5): 60 mg via INTRAVENOUS
  Filled 2014-07-29 (×8): qty 6

## 2014-07-29 NOTE — Progress Notes (Signed)
Inpatient Diabetes Program Recommendations  AACE/ADA: New Consensus Statement on Inpatient Glycemic Control (2013)  Target Ranges:  Prepandial:   less than 140 mg/dL      Peak postprandial:   less than 180 mg/dL (1-2 hours)      Critically ill patients:  140 - 180 mg/dL   Results for JOCSAN, MCGINLEY (MRN [id number]) as of 07/29/2014 10:39  Ref. Range 07/28/2014 06:08 07/28/2014 06:28 07/28/2014 12:27 07/28/2014 16:18 07/28/2014 21:42 07/29/2014 05:47 07/29/2014 06:46  Glucose-Capillary Latest Range: 70-99 mg/dL 58 (L) 76 178 (H) 148 (H) 174 (H) 40 (LL) 98   Text page sent to Dr. Aileen Fass to request Lantus reduction secondary to hypoglycemia x 2.  Will follow. Thank you  Raoul Pitch BSN, RN,CDE Inpatient Diabetes Coordinator [phone] (team pager)

## 2014-07-29 NOTE — Progress Notes (Signed)
Occupational Therapy Treatment Patient Details Name: [name] [name] MRN: [id number] DOB: [birth year] Today's Date: 07/29/2014    History of present illness With h/o diastolic HF, lung cancer s/p chemoradiation, chronic respiratory failure presents to ED from SNF with dyspnea starting last night. Had recent hospitalization at [facility] for CHF exacerbation, then went to SNF at [facility]. Weight 224 lbs, up from 193 lbs in July.  CXR shows left pleural effusion. Present on CT scan earlier this month (with interval decrease in size of left upper lobe perihilar/perimediastinal mass compatible with response to therapy). proBNP 21,000 (1000 in July). Echo 6/15 EF 95-09%, grade 1 diastolic dysfunction. C/o worsening leg edema.   OT comments  Pt is performing sponge bathing, dressing, and gathering items for ADL at a modified independent level.  Asked case manager to switch RW to rollator so pt may use for energy conservation.    Follow Up Recommendations  Home health OT (to assess intrumental daily living skills at home)    Equipment Recommendations  Other (comment) (rollator)    Recommendations for Other Services      Precautions / Restrictions Precautions Precautions: None       Mobility Bed Mobility Overal bed mobility: Modified Independent                Transfers Overall transfer level: Modified independent Equipment used: Rolling walker (2 wheeled)                  Balance                                   ADL       Grooming: Wash/dry hands;Oral care;Modified independent   Upper Body Bathing: Sitting;Modified independent   Lower Body Bathing: Sit to/from stand;Modified independent   Upper Body Dressing : Set up;Sitting   Lower Body Dressing: Modified independent;Sit to/from stand Lower Body Dressing Details (indicate cue type and reason): assist for compression hose, but can donn socks independently Toilet Transfer: Modified  Independent;Regular Toilet (pushing IV pole)   Toileting- Clothing Manipulation and Hygiene: Modified independent;Sit to/from stand       Functional mobility during ADLs: Modified independent (pushing IV pole) General ADL Comments: Pt able to unplug IV pole, gather items from around room independently.  Needs assist for compression stockings.  Pt  instructed in pursed lip breathing and to monitor fatigue.  Pt has been routinely performing bathing and dressing seated at sink.      Vision                     Perception     Praxis      Cognition   Behavior During Therapy: WFL for tasks assessed/performed Overall Cognitive Status: Within Functional Limits for tasks assessed                       Extremity/Trunk Assessment               Exercises     Shoulder Instructions       General Comments      Pertinent Vitals/ Pain       Pain Assessment: No/denies pain  Home Living  Prior Functioning/Environment              Frequency Min 2X/week     Progress Toward Goals  OT Goals(current goals can now be found in the care plan section)  Progress towards OT goals: Progressing toward goals  Acute Rehab OT Goals Patient Stated Goal: return to PLOF Time For Goal Achievement: 08/01/14  Plan Discharge plan needs to be updated    Co-evaluation                 End of Session     Activity Tolerance Patient tolerated treatment well   Patient Left in bed;with call bell/phone within reach   Nurse Communication  (CM, to exchange RW for rollator)        Time: 5997-7414 OT Time Calculation (min): 24 min  Charges: OT General Charges $OT Visit: 1 Procedure OT Treatments $Self Care/Home Management : 23-37 mins  Malka So 07/29/2014, 3:41 PM [phone]

## 2014-07-29 NOTE — Progress Notes (Signed)
Chart review complete.  Patient is not eligible for [facility] Care Management services because his is not a [facility] [employer] member or is not [facility] affiliated.  Collaborated with Dr Olevia Bowens regarding discharge strategies to address patient's home management stability and code status.  [facility] will not formally engage.  Referred patient to [facility] [phone].  For any additional questions or new referrals please contact [facility] Liaison at [phone]

## 2014-07-29 NOTE — Progress Notes (Signed)
[facility] PROGRESS NOTE Interim History: 80 y.o. male With h/o diastolic HF, lung cancer s/p chemoradiation, chronic respiratory failure presents to ED from SNF with dyspnea starting last night. Had recent hospitalization at [facility] for CHF exacerbation, then went to SNF at [facility]. Weight 224 lbs, up from 195 lbs at discharge in July. CXR shows left pleural effusion and vascular congestion. Also with increase LE edema.   Filed Weights   07/27/14 0537 07/28/14 0540 07/29/14 0649  Weight: 98.612 kg (217 lb 6.4 oz) 96.707 kg (213 lb 3.2 oz) 95.482 kg (210 lb 8 oz)        Intake/Output Summary (Last 24 hours) at 07/29/14 1208 Last data filed at 07/29/14 1133  Gross per 24 hour  Intake    120 ml  Output   1445 ml  Net  -1325 ml    Assessment/Plan: Acute on chronic diastolic congestive heart failure:  - Continue daily weights, low sodium diet and strict intake and output - Continue IV lasix 60mg  daily and metolazone  - b-blockers (holding it due to acute exacerbation and low BP) and nitrates (holding due to low BP) - Cardiology on board still not to dry weight. -dry weight 195-196.  Acute on chronic resp failure:  - due to #1. Treatment as mentioned above  COPD (chronic obstructive pulmonary disease):  - stable. -continue home medication regimen and oxygen supplementation (2-3L at baseline)  Squamous cell carcinoma lung: continue follow up with oncology as an outpatient  CAD:  - troponin are neg -no CP -continue ASA and statins -once BP improved will resume coreg, continue holding for now  DM (diabetes mellitus) type II controlled,  - neurological manifestation: continue SSI, lantus and linagliptin  -continue neurontin  HLD:  - continue statins  BPH:  - continue proscar and flomax.  Depression:  - continue celexa.  GERD:  - continue PPI and sucralfate     Code Status: Partial (DNI) Family Communication: no family at  bedside Disposition Plan: back to SNF for rehab once CHF is compensated.    Consultants:  None   Procedures: ECHO: 04/11/14 - Left ventricle: The cavity size was normal. Wall thickness was increased in a pattern of mild LVH. Systolic function was normal. The estimated ejection fraction was in the range of 50% to 55%. Wall motion was normal; there were no regional wall motion abnormalities. Doppler parameters are consistent with abnormal left ventricular relaxation (grade 1 diastolic dysfunction).  Antibiotics:  None   HPI/Subjective: No compalins  Objective: Filed Vitals:   07/28/14 1428 07/28/14 1835 07/28/14 2022 07/29/14 0649  BP: 86/50 92/54 92/55  91/59  Pulse: 86 91 79 81  Temp: 97.5 F (36.4 C) 97.8 F (36.6 C) 98.1 F (36.7 C) 98.4 F (36.9 C)  TempSrc: Oral Oral Oral Oral  Resp: 20 18 18 17   Height:      Weight:    95.482 kg (210 lb 8 oz)  SpO2: 100% 100% 94% 97%     Exam: General: Alert, awake, oriented x3. HEENT: No bruits, no goiter. Mild JVD Heart: Regular rate; positive SEM, no rubs or gallops. 2+ LE edema bilaterally Lungs: decreased BS bibasilar and fine crackles; no wheezing Abdomen: Soft, nontender, nondistended, positive bowel sounds.   Data Reviewed: Basic Metabolic Panel:  Recent Labs Lab 07/23/14 0955 07/24/14 0434 07/25/14 0535 07/26/14 0601 07/28/14 0640  NA 136* 138 139 139 137  K 3.8 3.8 3.5* 3.6* 3.6*  CL 95* 95* 97 95* 93*  CO2 31 33* 34* 35* 35*  GLUCOSE 136* 62* 61* 88 91  BUN 13 16 18 18 14   CREATININE 0.90 1.08 1.08 1.03 1.04  CALCIUM 8.6 8.7 8.7 8.4 8.6   Liver Function Tests:  Recent Labs Lab 07/23/14 0955  AST 23  ALT 19  ALKPHOS 39  BILITOT 0.7  PROT 5.8*  ALBUMIN 2.6*   CBC:  Recent Labs Lab 07/23/14 0955 07/28/14 0640  WBC 3.5* 3.2*  NEUTROABS 2.2  --   HGB 9.5* 9.5*  HCT 29.7* 30.0*  MCV 92.8 95.5  PLT 146* 143*   Cardiac Enzymes:  Recent Labs Lab 07/23/14 0955  TROPONINI <0.30    BNP (last 3 results)  Recent Labs  04/10/14 1145 05/13/14 2240 07/23/14 0955  PROBNP 5244.0* 1118.0* 21437.0*   CBG:  Recent Labs Lab 07/28/14 1618 07/28/14 2142 07/29/14 0547 07/29/14 0646 07/29/14 1111  GLUCAP 148* 174* 40* 98 167*    Recent Results (from the past 240 hour(s))  MRSA PCR SCREENING     Status: Abnormal   Collection Time    07/23/14  8:17 PM      Result Value Ref Range Status   MRSA by PCR POSITIVE (*) NEGATIVE Final   Comment:            The GeneXpert MRSA Assay (FDA     approved for NASAL specimens     only), is one component of a     comprehensive MRSA colonization     surveillance program. It is not     intended to diagnose MRSA     infection nor to guide or     monitor treatment for     MRSA infections.     RESULT CALLED TO, READ BACK BY AND VERIFIED WITH:     HARAWAY,J RN [id number] AT 2235 ON [id number] SKEEN,P     Studies: No results found.  Scheduled Meds: . amiodarone  200 mg Oral BID  . antiseptic oral rinse  7 mL Mouth Rinse BID  . aspirin  81 mg Oral BID  . atorvastatin  40 mg Oral Daily  . citalopram  20 mg Oral Daily  . docusate sodium  100 mg Oral BID  . enoxaparin (LOVENOX) injection  40 mg Subcutaneous Q24H  . finasteride  5 mg Oral Daily  . furosemide  60 mg Intravenous Q6H  . gabapentin  600 mg Oral BID  . insulin aspart  0-9 Units Subcutaneous TID WC  . insulin glargine  22 Units Subcutaneous QHS  . linagliptin  5 mg Oral Q supper  . metolazone  2.5 mg Oral Daily  . pantoprazole  40 mg Oral Daily  . potassium chloride  40 mEq Oral Daily  . sodium chloride  3 mL Intravenous Q12H  . sucralfate  1 g Oral QID  . tamsulosin  0.4 mg Oral QPC supper   Time: > 35 minutes   Charlynne Cousins MD [facility] Pager [phone] If 8PM-8AM, please contact night-coverage at [email or web address], password [facility] 07/29/2014, 12:08 PM  LOS: 6 days

## 2014-07-29 NOTE — Progress Notes (Addendum)
CSW spoke to patient and his son Nicole Kindred this afternoon.  Per son- he and his wife live about 3 blocks from patient and patient's niece lives nearby as well. They plan for patient to return home at d/c rather than seek SNF placement again. They do not want return to [facility] and at this time do not want to consider any other SNF.  Per son- patient needs a rolling walker. He already has home 02 with advanced home health and is requesting [facility], PT and would like an aide.  CSW will notify RNCM of this.  CSW will sign off but will be available if needed.  Dub Amis- son-  (c)  [phone]    His wife is Ginger O'Neal-- (c)  [phone].   Lorie Phenix. Pauline Good, [place]

## 2014-07-29 NOTE — Progress Notes (Addendum)
Patient Name: [name] [name] Date of Encounter: 07/29/2014     Principal Problem:   Acute on chronic combined systolic and diastolic congestive heart failure, NYHA class 4 Active Problems:   DM (diabetes mellitus) type II controlled, neurological manifestation   CAD   Squamous cell carcinoma lung   COPD (chronic obstructive pulmonary disease)   Chronic respiratory failure   Acute on chronic diastolic CHF (congestive heart failure), NYHA class 4   PAF (paroxysmal atrial fibrillation)    SUBJECTIVE  No CP or SOB.   CURRENT MEDS . amiodarone  200 mg Oral BID  . antiseptic oral rinse  7 mL Mouth Rinse BID  . aspirin  81 mg Oral BID  . atorvastatin  40 mg Oral Daily  . citalopram  20 mg Oral Daily  . docusate sodium  100 mg Oral BID  . enoxaparin (LOVENOX) injection  40 mg Subcutaneous Q24H  . finasteride  5 mg Oral Daily  . furosemide  60 mg Intravenous Q6H  . gabapentin  600 mg Oral BID  . insulin aspart  0-9 Units Subcutaneous TID WC  . insulin glargine  22 Units Subcutaneous QHS  . linagliptin  5 mg Oral Q supper  . metolazone  2.5 mg Oral Daily  . pantoprazole  40 mg Oral Daily  . potassium chloride  40 mEq Oral Daily  . sodium chloride  3 mL Intravenous Q12H  . sucralfate  1 g Oral QID  . tamsulosin  0.4 mg Oral QPC supper    OBJECTIVE  Filed Vitals:   07/28/14 1428 07/28/14 1835 07/28/14 2022 07/29/14 0649  BP: 86/50 92/54 92/55  91/59  Pulse: 86 91 79 81  Temp: 97.5 F (36.4 C) 97.8 F (36.6 C) 98.1 F (36.7 C) 98.4 F (36.9 C)  TempSrc: Oral Oral Oral Oral  Resp: 20 18 18 17   Height:      Weight:    210 lb 8 oz (95.482 kg)  SpO2: 100% 100% 94% 97%    Intake/Output Summary (Last 24 hours) at 07/29/14 1038 Last data filed at 07/29/14 0933  Gross per 24 hour  Intake    120 ml  Output   1245 ml  Net  -1125 ml   Filed Weights   07/27/14 0537 07/28/14 0540 07/29/14 0649  Weight: 217 lb 6.4 oz (98.612 kg) 213 lb 3.2 oz (96.707 kg) 210 lb 8 oz  (95.482 kg)    PHYSICAL EXAM  General: Pleasant, NAD. Neuro: Alert and oriented X 3. Moves all extremities spontaneously. Psych: Normal affect. HEENT:  Normal  Neck: Supple without bruits or JVD. Lungs:  Resp regular and unlabored, mild bibasilar decreased breath sound Heart: RRR no s3, s4, or murmurs. Abdomen: Soft, non-tender, non-distended, BS + x 4.  Extremities: No clubbing, cyanosis. DP/PT/Radials 2+ and equal bilaterally. 2+ bilateral LE pitting edema  Accessory Clinical Findings  CBC  Recent Labs  07/28/14 0640  WBC 3.2*  HGB 9.5*  HCT 30.0*  MCV 95.5  PLT 166*   Basic Metabolic Panel  Recent Labs  07/28/14 0640  NA 137  K 3.6*  CL 93*  CO2 35*  GLUCOSE 91  BUN 14  CREATININE 1.04  CALCIUM 8.6    TELE NSR with HR 90-100    ECG  No new EKG  Echocardiogram 07/27/2014  LV EF: 25% - 30%  ------------------------------------------------------------------- Indications: CHF - 428.0.  ------------------------------------------------------------------- History: PMH: Syncope, dyspnea, and murmur. Coronary artery disease. Aortic valve disease. Chronic obstructive pulmonary disease. Risk  factors: Non-Hodgkin&'s lymphoma. History of lung cancer. Abdominal aortic aneurysm. NSTEMI. Hypertension. Diabetes mellitus. Dyslipidemia.  ------------------------------------------------------------------- Study Conclusions  - Left ventricle: The cavity size was normal. There was mild concentric hypertrophy. Systolic function was severely reduced. The estimated ejection fraction was in the range of 25% to 30%. Wall motion was normal; there were no regional wall motion abnormalities. Features are consistent with a pseudonormal left ventricular filling pattern, with concomitant abnormal relaxation and increased filling pressure (grade 2 diastolic dysfunction). Doppler parameters are consistent with elevated ventricular end-diastolic filling pressure. - Aortic  valve: Trileaflet; severely thickened, moderately calcified leaflets. Cusp separation was moderately reduced. There was mild stenosis. Mean gradient (S): 11 mm Hg. Peak gradient (S): 18 mm Hg. Valve area (VTI): 1.56 cm^2. Valve area (Vmax): 1.57 cm^2. Valve area (Vmean): 1.36 cm^2. - Aortic root: The aortic root was normal in size. - Mitral valve: Calcified annulus. Mildly thickened leaflets . There was moderate regurgitation. - Left atrium: The atrium was moderately dilated. - Right ventricle: The cavity size was mildly dilated. Wall thickness was normal. Systolic function was mildly reduced. - Pulmonic valve: There was no regurgitation. - Pulmonary arteries: Systolic pressure was severely increased. PA peak pressure: 63 mm Hg (S). - Inferior vena cava: The vessel was dilated. The respirophasic diameter changes were blunted (< 50%), consistent with elevated central venous pressure. - Pericardium, extracardiac: There was a large left pleural effusion.       Radiology/Studies  Dg Chest 2 View  07/26/2014   CLINICAL DATA:  Difficulty breathing; history of lung carcinoma  EXAM: CHEST  2 VIEW  COMPARISON:  July 23, 2014 chest radiograph and chest CT July 11, 2004  FINDINGS: Port-A-Cath tip is in the superior vena cava. No pneumothorax. The previously noted left upper lobe mass with volume loss persists without appreciable change. There is a moderate effusion on the left. Elsewhere there is generalized interstitial edema. Masses in the right lower lobe seen on recent CT are not well seen by radiography. The heart size is normal. Pulmonary vascularity is within normal limits. There is atherosclerotic change in the aorta. No bone lesions.  IMPRESSION: Persistent left effusion and generalized interstitial edema. Left upper lobe mass with volume loss persists. Masses in the right lower lobe seen on recent CT are not well seen by radiography. No change in cardiac silhouette. No  pneumothorax.   Electronically Signed   By: Lowella Grip M.D.   On: 07/26/2014 09:01   Dg Chest 2 View  07/23/2014   CLINICAL DATA:  Shortness of breath.  EXAM: CHEST  2 VIEW  COMPARISON:  CT 07/11/2014.  Chest x-ray  06/25/2014.  FINDINGS: Prior port catheter in good anatomic position. Mediastinum and stable. Heart size stable. Left upper lobe mass with left upper lobe atelectasis again noted. Left-sided pleural effusion again noted. Small right pleural effusion. No acute bony abnormality.  IMPRESSION: 1. Left upper lobe mass with left upper lobe atelectasis appears stable. 2. Prominent left pleural effusion.  Small right pleural effusion. 3. Power port catheter in stable position.   Electronically Signed   By: Marcello Moores  Register   On: 07/23/2014 10:47   Ct Chest W Contrast  07/11/2014   CLINICAL DATA:  Followup lung cancer.  EXAM: CT CHEST WITH CONTRAST  TECHNIQUE: Multidetector CT imaging of the chest was performed during intravenous contrast administration.  CONTRAST:  54mL OMNIPAQUE IOHEXOL 300 MG/ML  SOLN  COMPARISON:  03/14/2014  FINDINGS: Mediastinum: There is moderate cardiac enlargement. Calcified atherosclerotic disease involves  the thoracic aorta as well as the left main coronary artery, LAD, left circumflex and RCA coronary arteries. No pericardial effusions. No right paratracheal, sub- carinal or right hilar adenopathy identified.  Lungs/Pleura: There is a moderate left pleural effusion which is new from previous exam. Small right pleural effusion is also new from the previous study. Loculated fluid is identified along the oblique fissure of the right lung. The left upper lobe paramediastinal mass measures 3.6 x 8.8 x 8.8 cm. Previously this measured 6.6 x 10.2 x 9.0 cm.  Upper Abdomen: The visualized portions of the adrenal glands are normal. The visualized portions of the spleen and pancreas are also unremarkable. There is a stone within the gallbladder which measures 1.7 cm. No focal liver  abnormality identified.  Musculoskeletal: Review of the visualized bony structures shows no aggressive lytic or sclerotic bone lesion.  IMPRESSION: 1. Interval decrease in size of left upper lobe perihilar/perimediastinal mass compatible with response to therapy.  2. Bilateral pleural effusions, left greater than right. New from previous exam.  3. Atherosclerotic disease including multi vessel coronary artery calcifications.   Electronically Signed   By: Kerby Moors M.D.   On: 07/11/2014 17:16    ASSESSMENT AND PLAN  1. Acute on chronic diastolic HF with new systolic HF  - proBNP > 20100, diuresis limited by hypotension   - echo 04/11/2014 EF 71-21%, grade 1 diastolic dysfunction, mild MR   - post-chemo echo 07/27/2014 EF 97-58%, grade 2 diastolic dysfunc, mild AS, moderate MR, PA peak pressure 63, large L pleural effusion  - continue IV lasix with metolazone, pending BMET today  - new onset systolic HF of unclear etiology. This could be progression of CAD but he has been deemed not to be a candidate for CABG in the past due to advanced lung CA.  Oncology does not think that new LV dysfunction related to his chemo meds.  Also need to consider malignant pleural effusion.  May need consider diagnostic thoracentesis.  2. Moderate L pleural effusion   - echo shows large L pleural effusion, may need thoracentesis for symptomatic improvement as well as diagnostic purposes to determined if lung CA has progressed.  3. CAD   - cath 04/10/2014 70% ostial L main, 80% distal L main, 50% prox LAD, stenosis in Mid and distal RCA   - not a candidate for CABG given advanced lung cancer   - no CP, troponin negative, continue medical therapy  4. DM  5. HTN  6. HLD  7. Squamous cell lung cancer: L lung   - per oncology, s/p XRT and chemo, completed in early Aug 8. PAD: s/p prior R fem-pop bypass  9. H/o PAF with RVR during previous admittion, spontaneously resolved, on metoprolol, felt anticoagulation not  indicated   Signed, Woodward Ku Pager: [phone]  I have personally seen and examined the patient and agree with note above by Almyra Deforest, PA with minor changes.  Diuresing on Lasix q6 hrs and will continue as renal function and BP allow.  Hypotension prevents use of BB or ACE I at this time.  Difficult situation with inability to adequately treat LV dysfunction with CHF meds due to low BP.  With other comorbidities including advanced lung CA may be time to consider Palliative Care.  Signed: Fransico Him, MD [facility] 07/29/2014

## 2014-07-29 NOTE — Progress Notes (Signed)
AM Glucose was 40 orange juice and peanut butter crackers given repeat Glucose was 98.

## 2014-07-30 ENCOUNTER — Inpatient Hospital Stay (HOSPITAL_COMMUNITY): Payer: Medicare Other

## 2014-07-30 DIAGNOSIS — E1149 Type 2 diabetes mellitus with other diabetic neurological complication: Secondary | ICD-10-CM

## 2014-07-30 DIAGNOSIS — J90 Pleural effusion, not elsewhere classified: Secondary | ICD-10-CM

## 2014-07-30 DIAGNOSIS — Z85118 Personal history of other malignant neoplasm of bronchus and lung: Secondary | ICD-10-CM

## 2014-07-30 LAB — GLUCOSE, CAPILLARY
GLUCOSE-CAPILLARY: 84 mg/dL (ref 70–99)
GLUCOSE-CAPILLARY: 160 mg/dL — AB (ref 70–99)
Glucose-Capillary: 43 mg/dL — CL (ref 70–99)
Glucose-Capillary: 153 mg/dL — ABNORMAL HIGH (ref 70–99)
Glucose-Capillary: 128 mg/dL — ABNORMAL HIGH (ref 70–99)

## 2014-07-30 LAB — BASIC METABOLIC PANEL
Anion gap: 7 (ref 5–15)
BUN: 13 mg/dL (ref 6–23)
CALCIUM: 8.3 mg/dL — AB (ref 8.4–10.5)
CO2: 38 mEq/L — ABNORMAL HIGH (ref 19–32)
Chloride: 92 mEq/L — ABNORMAL LOW (ref 96–112)
Creatinine, Ser: 1.06 mg/dL (ref 0.50–1.35)
GFR calc Af Amer: 75 mL/min — ABNORMAL LOW (ref >90)
GFR, EST NON AFRICAN AMERICAN: 65 mL/min — AB (ref >90)
GLUCOSE: 77 mg/dL (ref 70–99)
Potassium: 3.9 mEq/L (ref 3.7–5.3)
Sodium: 137 mEq/L (ref 137–147)

## 2014-07-30 NOTE — Progress Notes (Signed)
Pt. BP 81/47. On call for [facility] notified. RN instructed to hold scheduled dose of Lasix. RN will continue to monitor pt. For changes in condition. Madhavi Hamblen, Katherine Roan

## 2014-07-30 NOTE — Progress Notes (Signed)
Pt. With low blood glucose this am. 15 gm of carbs given to pt. Dr. Aileen Fass on the floor and notified. RN will continue to monitor pt. For changes in condition. Magdalina Whitehead, Katherine Roan

## 2014-07-30 NOTE — Progress Notes (Signed)
Patient Name: [name] [name] Date of Encounter: 07/30/2014     Principal Problem:   Acute on chronic combined systolic and diastolic congestive heart failure, NYHA class 4 Active Problems:   DM (diabetes mellitus) type II controlled, neurological manifestation   CAD   Squamous cell carcinoma lung   COPD (chronic obstructive pulmonary disease)   Chronic respiratory failure   Acute on chronic diastolic CHF (congestive heart failure), NYHA class 4   PAF (paroxysmal atrial fibrillation)    SUBJECTIVE  No CP or SOB. Not a lot of diuresis recorded. Weight, however, is down 5 lbs since yesterday and there is significant urine output. BP remains soft.  CURRENT MEDS . amiodarone  200 mg Oral BID  . antiseptic oral rinse  7 mL Mouth Rinse BID  . aspirin  81 mg Oral BID  . atorvastatin  40 mg Oral Daily  . citalopram  20 mg Oral Daily  . docusate sodium  100 mg Oral BID  . enoxaparin (LOVENOX) injection  40 mg Subcutaneous Q24H  . finasteride  5 mg Oral Daily  . furosemide  60 mg Intravenous Q6H  . gabapentin  600 mg Oral BID  . insulin aspart  0-9 Units Subcutaneous TID WC  . insulin glargine  22 Units Subcutaneous QHS  . linagliptin  5 mg Oral Q supper  . metolazone  2.5 mg Oral Daily  . pantoprazole  40 mg Oral Daily  . potassium chloride  40 mEq Oral Daily  . sodium chloride  3 mL Intravenous Q12H  . sucralfate  1 g Oral QID  . tamsulosin  0.4 mg Oral QPC supper    OBJECTIVE  Filed Vitals:   07/29/14 1402 07/29/14 2213 07/30/14 0500 07/30/14 0953  BP: 85/53 81/47 90/61  93/52  Pulse: 89 89 83 97  Temp: 98 F (36.7 C) 97.4 F (36.3 C) 97.2 F (36.2 C)   TempSrc: Oral Oral Oral   Resp: 18 18 20    Height:      Weight:   205 lb 4 oz (93.1 kg)   SpO2: 98% 100% 96%     Intake/Output Summary (Last 24 hours) at 07/30/14 1204 Last data filed at 07/30/14 0942  Gross per 24 hour  Intake   1360 ml  Output   1500 ml  Net   -140 ml   Filed Weights   07/28/14 0540  07/29/14 0649 07/30/14 0500  Weight: 213 lb 3.2 oz (96.707 kg) 210 lb 8 oz (95.482 kg) 205 lb 4 oz (93.1 kg)    PHYSICAL EXAM  General: Pleasant, NAD. Neuro: Alert and oriented X 3. Moves all extremities spontaneously. Psych: Normal affect. HEENT:  Normal  Neck: Supple without bruits or JVD. Lungs:  Resp regular and unlabored, mild bibasilar decreased breath sound Heart: RRR no s3, s4, or murmurs. Abdomen: Soft, non-tender, non-distended, BS + x 4.  Extremities: No clubbing, cyanosis. DP/PT/Radials 2+ and equal bilaterally. 2+ bilateral LE pitting edema  Accessory Clinical Findings  CBC  Recent Labs  07/28/14 0640  WBC 3.2*  HGB 9.5*  HCT 30.0*  MCV 95.5  PLT 176*   Basic Metabolic Panel  Recent Labs  07/28/14 0640 07/30/14 0445  NA 137 137  K 3.6* 3.9  CL 93* 92*  CO2 35* 38*  GLUCOSE 91 77  BUN 14 13  CREATININE 1.04 1.06  CALCIUM 8.6 8.3*    TELE NSR with HR 90-100    ECG  No new EKG  Echocardiogram 07/27/2014  LV  EF: 25% - 30%  ------------------------------------------------------------------- Indications: CHF - 428.0.  ------------------------------------------------------------------- History: PMH: Syncope, dyspnea, and murmur. Coronary artery disease. Aortic valve disease. Chronic obstructive pulmonary disease. Risk factors: Non-Hodgkin&'s lymphoma. History of lung cancer. Abdominal aortic aneurysm. NSTEMI. Hypertension. Diabetes mellitus. Dyslipidemia.  ------------------------------------------------------------------- Study Conclusions  - Left ventricle: The cavity size was normal. There was mild concentric hypertrophy. Systolic function was severely reduced. The estimated ejection fraction was in the range of 25% to 30%. Wall motion was normal; there were no regional wall motion abnormalities. Features are consistent with a pseudonormal left ventricular filling pattern, with concomitant abnormal relaxation and increased filling  pressure (grade 2 diastolic dysfunction). Doppler parameters are consistent with elevated ventricular end-diastolic filling pressure. - Aortic valve: Trileaflet; severely thickened, moderately calcified leaflets. Cusp separation was moderately reduced. There was mild stenosis. Mean gradient (S): 11 mm Hg. Peak gradient (S): 18 mm Hg. Valve area (VTI): 1.56 cm^2. Valve area (Vmax): 1.57 cm^2. Valve area (Vmean): 1.36 cm^2. - Aortic root: The aortic root was normal in size. - Mitral valve: Calcified annulus. Mildly thickened leaflets . There was moderate regurgitation. - Left atrium: The atrium was moderately dilated. - Right ventricle: The cavity size was mildly dilated. Wall thickness was normal. Systolic function was mildly reduced. - Pulmonic valve: There was no regurgitation. - Pulmonary arteries: Systolic pressure was severely increased. PA peak pressure: 63 mm Hg (S). - Inferior vena cava: The vessel was dilated. The respirophasic diameter changes were blunted (< 50%), consistent with elevated central venous pressure. - Pericardium, extracardiac: There was a large left pleural effusion.       Radiology/Studies  Dg Chest 2 View  07/26/2014   CLINICAL DATA:  Difficulty breathing; history of lung carcinoma  EXAM: CHEST  2 VIEW  COMPARISON:  July 23, 2014 chest radiograph and chest CT July 11, 2004  FINDINGS: Port-A-Cath tip is in the superior vena cava. No pneumothorax. The previously noted left upper lobe mass with volume loss persists without appreciable change. There is a moderate effusion on the left. Elsewhere there is generalized interstitial edema. Masses in the right lower lobe seen on recent CT are not well seen by radiography. The heart size is normal. Pulmonary vascularity is within normal limits. There is atherosclerotic change in the aorta. No bone lesions.  IMPRESSION: Persistent left effusion and generalized interstitial edema. Left upper lobe mass with volume  loss persists. Masses in the right lower lobe seen on recent CT are not well seen by radiography. No change in cardiac silhouette. No pneumothorax.   Electronically Signed   By: Lowella Grip M.D.   On: 07/26/2014 09:01   Dg Chest 2 View  07/23/2014   CLINICAL DATA:  Shortness of breath.  EXAM: CHEST  2 VIEW  COMPARISON:  CT 07/11/2014.  Chest x-ray  06/25/2014.  FINDINGS: Prior port catheter in good anatomic position. Mediastinum and stable. Heart size stable. Left upper lobe mass with left upper lobe atelectasis again noted. Left-sided pleural effusion again noted. Small right pleural effusion. No acute bony abnormality.  IMPRESSION: 1. Left upper lobe mass with left upper lobe atelectasis appears stable. 2. Prominent left pleural effusion.  Small right pleural effusion. 3. Power port catheter in stable position.   Electronically Signed   By: Marcello Moores  Register   On: 07/23/2014 10:47   Ct Chest W Contrast  07/11/2014   CLINICAL DATA:  Followup lung cancer.  EXAM: CT CHEST WITH CONTRAST  TECHNIQUE: Multidetector CT imaging of the chest was performed during  intravenous contrast administration.  CONTRAST:  64mL OMNIPAQUE IOHEXOL 300 MG/ML  SOLN  COMPARISON:  03/14/2014  FINDINGS: Mediastinum: There is moderate cardiac enlargement. Calcified atherosclerotic disease involves the thoracic aorta as well as the left main coronary artery, LAD, left circumflex and RCA coronary arteries. No pericardial effusions. No right paratracheal, sub- carinal or right hilar adenopathy identified.  Lungs/Pleura: There is a moderate left pleural effusion which is new from previous exam. Small right pleural effusion is also new from the previous study. Loculated fluid is identified along the oblique fissure of the right lung. The left upper lobe paramediastinal mass measures 3.6 x 8.8 x 8.8 cm. Previously this measured 6.6 x 10.2 x 9.0 cm.  Upper Abdomen: The visualized portions of the adrenal glands are normal. The visualized  portions of the spleen and pancreas are also unremarkable. There is a stone within the gallbladder which measures 1.7 cm. No focal liver abnormality identified.  Musculoskeletal: Review of the visualized bony structures shows no aggressive lytic or sclerotic bone lesion.  IMPRESSION: 1. Interval decrease in size of left upper lobe perihilar/perimediastinal mass compatible with response to therapy.  2. Bilateral pleural effusions, left greater than right. New from previous exam.  3. Atherosclerotic disease including multi vessel coronary artery calcifications.   Electronically Signed   By: Kerby Moors M.D.   On: 07/11/2014 17:16    ASSESSMENT AND PLAN  1. Acute on chronic diastolic HF with new systolic HF  - proBNP > 88416, diuresis limited by hypotension   - echo 04/11/2014 EF 60-63%, grade 1 diastolic dysfunction, mild MR   - post-chemo echo 07/27/2014 EF 01-60%, grade 2 diastolic dysfunc, mild AS, moderate MR, PA peak pressure    63, moderate L pleural effusion  - continue IV lasix with metolazone  2. Moderate L pleural effusion   - echo shows large L pleural effusion, may need thoracentesis for symptomatic improvement as well as diagnostic purposes to determined if lung CA has progressed. Would recommend repeat CXR to compare to 10/29 film.  3. CAD   - cath 04/10/2014 70% ostial L main, 80% distal L main, 50% prox LAD, stenosis in Mid and distal RCA   - not a candidate for CABG given advanced lung cancer   - no CP, troponin negative, continue medical therapy  4. DM  5. HTN  6. HLD  7. Squamous cell lung cancer: L lung   - per oncology, s/p XRT and chemo, completed in early Aug 8. PAD: s/p prior R fem-pop bypass  9. H/o PAF with RVR during previous admission, spontaneously resolved, on metoprolol and amiodarone, felt anticoagulation is contraindicated because of lung CA  [name], MD, [facility] Attending Cardiologist [facility]  Iyanah Demont C

## 2014-07-30 NOTE — Progress Notes (Signed)
[facility] PROGRESS NOTE Interim History: 80 y.o. male With h/o diastolic HF, lung cancer s/p chemoradiation, chronic respiratory failure presents to ED from SNF with dyspnea starting last night. Had recent hospitalization at [facility] for CHF exacerbation, then went to SNF at [facility]. Weight 224 lbs, up from 195 lbs at discharge in July. CXR shows left pleural effusion and vascular congestion. Also with increase LE edema.   Filed Weights   07/28/14 0540 07/29/14 0649 07/30/14 0500  Weight: 96.707 kg (213 lb 3.2 oz) 95.482 kg (210 lb 8 oz) 93.1 kg (205 lb 4 oz)        Intake/Output Summary (Last 24 hours) at 07/30/14 1228 Last data filed at 07/30/14 8657  Gross per 24 hour  Intake   1360 ml  Output   1500 ml  Net   -140 ml    Assessment/Plan: Acute on chronic diastolic congestive heart failure:  - Continue daily weights, low sodium diet and strict intake and output - Continue IV lasix 60mg  daily and metolazone  - B-blockers (holding it due to acute exacerbation and low BP) and nitrates (holding due to low BP) - Cardiology on board still not to dry weight. -dry weight 195-196. - Recommended to repeat CXR  Pleural effusion: - Repeat CXR, to compare with previous cxr.  Acute on chronic resp failure:  - due to #1. Treatment as mentioned above  COPD (chronic obstructive pulmonary disease):  - stable. - continue home medication regimen and oxygen supplementation (2-3L at baseline)  Squamous cell carcinoma lung: continue follow up with oncology as an outpatient  CAD:  - Troponin are neg - No CP - Continue ASA and statins - Once BP improved will resume coreg, continue holding for now  DM (diabetes mellitus) type II controlled,  - Neurological manifestation: continue SSI, lantus and linagliptin  - Continue neurontin  BPH:  - continue proscar and flomax.  Depression:  - continue celexa.  GERD:  - continue PPI and sucralfate     Code  Status: Partial (DNI) Family Communication: no family at bedside Disposition Plan: back to SNF for rehab once CHF is compensated.    Consultants:  None   Procedures: ECHO: 04/11/14 - Left ventricle: The cavity size was normal. Wall thickness was increased in a pattern of mild LVH. Systolic function was normal. The estimated ejection fraction was in the range of 50% to 55%. Wall motion was normal; there were no regional wall motion abnormalities. Doppler parameters are consistent with abnormal left ventricular relaxation (grade 1 diastolic dysfunction).  Antibiotics:  None   HPI/Subjective: No compalins  Objective: Filed Vitals:   07/29/14 1402 07/29/14 2213 07/30/14 0500 07/30/14 0953  BP: 85/53 81/47 90/61  93/52  Pulse: 89 89 83 97  Temp: 98 F (36.7 C) 97.4 F (36.3 C) 97.2 F (36.2 C)   TempSrc: Oral Oral Oral   Resp: 18 18 20    Height:      Weight:   93.1 kg (205 lb 4 oz)   SpO2: 98% 100% 96%      Exam: General: Alert, awake, oriented x3. HEENT: No bruits, no goiter. Mild JVD Heart: Regular rate; positive SEM, no rubs or gallops. 2+ LE edema bilaterally Lungs: decreased BS bibasilar and fine crackles; no wheezing Abdomen: Soft, nontender, nondistended, positive bowel sounds.   Data Reviewed: Basic Metabolic Panel:  Recent Labs Lab 07/24/14 0434 07/25/14 0535 07/26/14 0601 07/28/14 0640 07/30/14 0445  NA 138 139 139 137 137  K 3.8  3.5* 3.6* 3.6* 3.9  CL 95* 97 95* 93* 92*  CO2 33* 34* 35* 35* 38*  GLUCOSE 62* 61* 88 91 77  BUN 16 18 18 14 13   CREATININE 1.08 1.08 1.03 1.04 1.06  CALCIUM 8.7 8.7 8.4 8.6 8.3*   Liver Function Tests: No results found for this basename: AST, ALT, ALKPHOS, BILITOT, PROT, ALBUMIN,  in the last 168 hours CBC:  Recent Labs Lab 07/28/14 0640  WBC 3.2*  HGB 9.5*  HCT 30.0*  MCV 95.5  PLT 143*   Cardiac Enzymes: No results found for this basename: CKTOTAL, CKMB, CKMBINDEX, TROPONINI,  in the last 168 hours BNP  (last 3 results)  Recent Labs  04/10/14 1145 05/13/14 2240 07/23/14 0955  PROBNP 5244.0* 1118.0* 21437.0*   CBG:  Recent Labs Lab 07/29/14 1650 07/29/14 2211 07/30/14 0646 07/30/14 0759 07/30/14 1106  GLUCAP 200* 102* 43* 84 153*    Recent Results (from the past 240 hour(s))  MRSA PCR SCREENING     Status: Abnormal   Collection Time    07/23/14  8:17 PM      Result Value Ref Range Status   MRSA by PCR POSITIVE (*) NEGATIVE Final   Comment:            The GeneXpert MRSA Assay (FDA     approved for NASAL specimens     only), is one component of a     comprehensive MRSA colonization     surveillance program. It is not     intended to diagnose MRSA     infection nor to guide or     monitor treatment for     MRSA infections.     RESULT CALLED TO, READ BACK BY AND VERIFIED WITH:     HARAWAY,J RN [id number] AT 2235 ON [id number] SKEEN,P     Studies: No results found.  Scheduled Meds: . amiodarone  200 mg Oral BID  . antiseptic oral rinse  7 mL Mouth Rinse BID  . aspirin  81 mg Oral BID  . atorvastatin  40 mg Oral Daily  . citalopram  20 mg Oral Daily  . docusate sodium  100 mg Oral BID  . enoxaparin (LOVENOX) injection  40 mg Subcutaneous Q24H  . finasteride  5 mg Oral Daily  . furosemide  60 mg Intravenous Q6H  . gabapentin  600 mg Oral BID  . insulin aspart  0-9 Units Subcutaneous TID WC  . insulin glargine  22 Units Subcutaneous QHS  . linagliptin  5 mg Oral Q supper  . metolazone  2.5 mg Oral Daily  . pantoprazole  40 mg Oral Daily  . potassium chloride  40 mEq Oral Daily  . sodium chloride  3 mL Intravenous Q12H  . sucralfate  1 g Oral QID  . tamsulosin  0.4 mg Oral QPC supper   Time: > 35 minutes   Charlynne Cousins MD [facility] Pager [phone] If 8PM-8AM, please contact night-coverage at [email or web address], password [facility] 07/30/2014, 12:28 PM  LOS: 7 days

## 2014-07-30 NOTE — Progress Notes (Signed)
Patient morning BP 93/52. Held 60 mg lasix IV. MD notified. Discussed plan of care. MD concluded ok to give lasix. BP before lasix 87/55. Will monitor pt.

## 2014-07-31 DIAGNOSIS — C3490 Malignant neoplasm of unspecified part of unspecified bronchus or lung: Secondary | ICD-10-CM

## 2014-07-31 DIAGNOSIS — J9611 Chronic respiratory failure with hypoxia: Secondary | ICD-10-CM

## 2014-07-31 DIAGNOSIS — R918 Other nonspecific abnormal finding of lung field: Secondary | ICD-10-CM

## 2014-07-31 LAB — GLUCOSE, CAPILLARY
Glucose-Capillary: 39 mg/dL — CL (ref 70–99)
Glucose-Capillary: 94 mg/dL (ref 70–99)
Glucose-Capillary: 180 mg/dL — ABNORMAL HIGH (ref 70–99)
Glucose-Capillary: 203 mg/dL — ABNORMAL HIGH (ref 70–99)
Glucose-Capillary: 160 mg/dL — ABNORMAL HIGH (ref 70–99)

## 2014-07-31 MED ORDER — FUROSEMIDE 40 MG PO TABS
40.0000 mg | ORAL_TABLET | Freq: Every day | ORAL | Status: DC
  Administered 2014-08-01 – 2014-08-02 (×2): 40 mg via ORAL
  Filled 2014-07-31 (×2): qty 1

## 2014-07-31 NOTE — Progress Notes (Signed)
Pt is alertx4. No complaints of pain .CBG at lunch 180. Insulin not given due to patient being NPO at time. MD notified. Pt resting, Will continue to monitor

## 2014-07-31 NOTE — Procedures (Signed)
Heart rhythm converted to A-Flutter heart rate around 110 Dr Venetia Constable notified.

## 2014-07-31 NOTE — Progress Notes (Signed)
AM Glucose was 39 orange juice and peanut butter crackers given. Follow up Glucose was 94

## 2014-07-31 NOTE — Progress Notes (Addendum)
[facility] PROGRESS NOTE Interim History: 80 y.o. male With h/o diastolic HF, lung cancer s/p chemoradiation, chronic respiratory failure presents to ED from SNF with dyspnea starting last night. Had recent hospitalization at [facility] for CHF exacerbation, then went to SNF at [facility]. Weight 224 lbs, up from 195 lbs at discharge in July. CXR shows left pleural effusion and vascular congestion. Also with increase LE edema.   Filed Weights   07/29/14 0649 07/30/14 0500 07/31/14 0508  Weight: 95.482 kg (210 lb 8 oz) 93.1 kg (205 lb 4 oz) 90.22 kg (198 lb 14.4 oz)        Intake/Output Summary (Last 24 hours) at 07/31/14 1017 Last data filed at 07/31/14 0900  Gross per 24 hour  Intake    720 ml  Output   1400 ml  Net   -680 ml    Assessment/Plan: Acute on chronic diastolic congestive heart failure:  - Continue daily weights, low sodium diet and strict intake and output. - Hold lasix and metolazone BP is low. Pt's cont to be ASX, check ortho statics. - B-blockers (holding it due to acute exacerbation and low BP) and nitrates (holding due to low BP). - Cardiology on board, he is only 2 lb of estimated dry weight. 196 lbs, probably thoracocentesis can get [place] there. - Check a b-met to monitor K. Restart lasix in am.  Pleural effusion: - IR consulted for drainage. For symptoms improvement.  Acute on chronic resp failure:  - Due to #1. Treatment as mentioned above.  COPD (chronic obstructive pulmonary disease):  - Stable. - Continue home medication regimen and oxygen supplementation (2-3L at baseline).  Squamous cell carcinoma lung: continue follow up with oncology as an outpatient  CAD:  - Troponin are neg. - No CP. - Continue ASA and statins. - Once BP improved will resume coreg, continue holding for now  DM (diabetes mellitus) type II controlled,  - Neurological manifestation: continue SSI, lantus and linagliptin  - Continue neurontin  BPH:   - continue proscar and flomax.  Depression:  - continue celexa.  GERD:  - continue PPI and sucralfate.    Code Status: Partial (DNI) Family Communication: no family at bedside Disposition Plan: back to SNF for rehab once CHF is compensated.    Consultants:  None   Procedures: ECHO: 04/11/14 - Left ventricle: The cavity size was normal. Wall thickness was increased in a pattern of mild LVH. Systolic function was normal. The estimated ejection fraction was in the range of 50% to 55%. Wall motion was normal; there were no regional wall motion abnormalities. Doppler parameters are consistent with abnormal left ventricular relaxation (grade 1 diastolic dysfunction).  Antibiotics:  None   HPI/Subjective: No compalins  Objective: Filed Vitals:   07/30/14 1400 07/30/14 2059 07/31/14 0508 07/31/14 0851  BP: 90/53 84/55  80/43  Pulse: 89 86 81 95  Temp: 97.8 F (36.6 C) 98.5 F (36.9 C) 97.8 F (36.6 C) 97.9 F (36.6 C)  TempSrc: Oral Oral Oral Oral  Resp: _0 Height:      Weight:   90.22 kg (198 lb 14.4 oz)   SpO2: 95% 100% 99% 92%     Exam: General: Alert, awake, oriented x3. HEENT: No bruits, no goiter. - JVD Heart: Regular rate;  2+ LE edema bilaterally Lungs: decreased BS bibasilar and fine crackles; no wheezing Abdomen: Soft, nontender, nondistended, positive bowel sounds.   Data Reviewed: Basic Metabolic Panel:  Recent Labs Lab  07/25/14 0535 07/26/14 0601 07/28/14 0640 07/30/14 0445  NA 139 139 137 137  K 3.5* 3.6* 3.6* 3.9  CL 97 95* 93* 92*  CO2 34* 35* 35* 38*  GLUCOSE 61* 88 91 77  BUN _0 CREATININE 1.08 1.03 1.04 1.06  CALCIUM 8.7 8.4 8.6 8.3*   Liver Function Tests: No results found for this basename: AST, ALT, ALKPHOS, BILITOT, PROT, ALBUMIN,  in the last 168 hours CBC:  Recent Labs Lab 07/28/14 0640  WBC 3.2*  HGB 9.5*  HCT 30.0*  MCV 95.5  PLT 143*   Cardiac Enzymes: No results found for this basename:  CKTOTAL, CKMB, CKMBINDEX, TROPONINI,  in the last 168 hours BNP (last 3 results)  Recent Labs  04/10/14 1145 05/13/14 2240 07/23/14 0955  PROBNP 5244.0* 1118.0* 21437.0*   CBG:  Recent Labs Lab 07/30/14 1106 07/30/14 1614 07/30/14 2056 07/31/14 0625 07/31/14 0700  GLUCAP 153* 128* 160* 39* 94    Recent Results (from the past 240 hour(s))  MRSA PCR SCREENING     Status: Abnormal   Collection Time    07/23/14  8:17 PM      Result Value Ref Range Status   MRSA by PCR POSITIVE (*) NEGATIVE Final   Comment:            The GeneXpert MRSA Assay (FDA     approved for NASAL specimens     only), is one component of a     comprehensive MRSA colonization     surveillance program. It is not     intended to diagnose MRSA     infection nor to guide or     monitor treatment for     MRSA infections.     RESULT CALLED TO, READ BACK BY AND VERIFIED WITH:     HARAWAY,J RN [id number] AT 2235 ON [id number] SKEEN,P     Studies: Dg Chest Port 1 View  07/30/2014   CLINICAL DATA:  Pleural effusion.  EXAM: PORTABLE CHEST - 1 VIEW  COMPARISON:  07/26/2014  FINDINGS: Pop or type central venous catheter with tip over the low SVC region. No change in position. Shallow inspiration. Cardiac enlargement with pulmonary vascular congestion and interstitial edema bilaterally. Calcification in the mitral valve annulus. Calcified and tortuous aorta. Left hilar mass with atelectasis in the left upper lung. Moderate size left pleural effusion with basilar atelectasis. No significant change since previous study.  IMPRESSION: Cardiac enlargement with pulmonary vascular congestion and edema. Left hilar mass with atelectasis in the left upper lung. Left pleural effusion with basilar atelectasis. No significant change.   Electronically Signed   By: Lucienne Capers M.D.   On: 07/30/2014 21:22    Scheduled Meds: . amiodarone  200 mg Oral BID  . antiseptic oral rinse  7 mL Mouth Rinse BID  . aspirin  81 mg Oral BID  .  atorvastatin  40 mg Oral Daily  . citalopram  20 mg Oral Daily  . docusate sodium  100 mg Oral BID  . enoxaparin (LOVENOX) injection  40 mg Subcutaneous Q24H  . finasteride  5 mg Oral Daily  . furosemide  60 mg Intravenous Q6H  . gabapentin  600 mg Oral BID  . insulin aspart  0-9 Units Subcutaneous TID WC  . insulin glargine  22 Units Subcutaneous QHS  . linagliptin  5 mg Oral Q supper  . metolazone  2.5 mg Oral Daily  . pantoprazole  40 mg Oral Daily  .  potassium chloride  40 mEq Oral Daily  . sodium chloride  3 mL Intravenous Q12H  . sucralfate  1 g Oral QID  . tamsulosin  0.4 mg Oral QPC supper   Time: > 35 minutes   FELIZ Garlan Fillers [facility] Pager [phone] If 8PM-8AM, please contact night-coverage at [email or web address], password [facility] 07/31/2014, 10:17 AM  LOS: 8 days

## 2014-07-31 NOTE — Progress Notes (Signed)
Patient Name: [name] [name] Date of Encounter: 07/31/2014     Principal Problem:   Acute on chronic combined systolic and diastolic congestive heart failure, NYHA class 4 Active Problems:   DM (diabetes mellitus) type II controlled, neurological manifestation   CAD   Squamous cell carcinoma lung   COPD (chronic obstructive pulmonary disease)   Chronic respiratory failure   Acute on chronic diastolic CHF (congestive heart failure), NYHA class 4   PAF (paroxysmal atrial fibrillation)    SUBJECTIVE  No CP or SOB. Not a lot of diuresis recorded. Weight, however, is down 5 lbs since yesterday and there is significant urine output. Tachycardia overnight - I do not see evidence for flutter, there is background artifact on telemetry. CXR yesterday shows persistent moderate pleural effusion.  CURRENT MEDS . amiodarone  200 mg Oral BID  . antiseptic oral rinse  7 mL Mouth Rinse BID  . aspirin  81 mg Oral BID  . atorvastatin  40 mg Oral Daily  . citalopram  20 mg Oral Daily  . docusate sodium  100 mg Oral BID  . enoxaparin (LOVENOX) injection  40 mg Subcutaneous Q24H  . finasteride  5 mg Oral Daily  . gabapentin  600 mg Oral BID  . insulin aspart  0-9 Units Subcutaneous TID WC  . insulin glargine  22 Units Subcutaneous QHS  . linagliptin  5 mg Oral Q supper  . metolazone  2.5 mg Oral Daily  . pantoprazole  40 mg Oral Daily  . potassium chloride  40 mEq Oral Daily  . sucralfate  1 g Oral QID  . tamsulosin  0.4 mg Oral QPC supper    OBJECTIVE  Filed Vitals:   07/30/14 1400 07/30/14 2059 07/31/14 0508 07/31/14 0851  BP: 90/53 84/55  80/43  Pulse: 89 86 81 95  Temp: 97.8 F (36.6 C) 98.5 F (36.9 C) 97.8 F (36.6 C) 97.9 F (36.6 C)  TempSrc: Oral Oral Oral Oral  Resp: 20 20 18 18   Height:      Weight:   198 lb 14.4 oz (90.22 kg)   SpO2: 95% 100% 99% 92%    Intake/Output Summary (Last 24 hours) at 07/31/14 1039 Last data filed at 07/31/14 0900  Gross per 24 hour    Intake    720 ml  Output   1400 ml  Net   -680 ml   Filed Weights   07/29/14 0649 07/30/14 0500 07/31/14 0508  Weight: 210 lb 8 oz (95.482 kg) 205 lb 4 oz (93.1 kg) 198 lb 14.4 oz (90.22 kg)    PHYSICAL EXAM  General: Pleasant, NAD. Neuro: Alert and oriented X 3. Moves all extremities spontaneously. Psych: Normal affect. HEENT:  Normal  Neck: Supple without bruits or JVD. Lungs:  Resp regular and unlabored, mild bibasilar decreased breath sound Heart: RRR no s3, s4, or murmurs. Abdomen: Soft, non-tender, non-distended, BS + x 4.  Extremities: No clubbing, cyanosis. DP/PT/Radials 2+ and equal bilaterally. 1+ bilateral LE pitting edema  Accessory Clinical Findings  CBC No results found for this basename: WBC, NEUTROABS, HGB, HCT, MCV, PLT,  in the last 72 hours Basic Metabolic Panel  Recent Labs  07/30/14 0445  NA 137  K 3.9  CL 92*  CO2 38*  GLUCOSE 77  BUN 13  CREATININE 1.06  CALCIUM 8.3*    TELE NSR with HR 90-100    ECG  No new EKG  Echocardiogram 07/27/2014  LV EF: 25% - 30%  ------------------------------------------------------------------- Indications:  CHF - 428.0.  ------------------------------------------------------------------- History: PMH: Syncope, dyspnea, and murmur. Coronary artery disease. Aortic valve disease. Chronic obstructive pulmonary disease. Risk factors: Non-Hodgkin&'s lymphoma. History of lung cancer. Abdominal aortic aneurysm. NSTEMI. Hypertension. Diabetes mellitus. Dyslipidemia.  ------------------------------------------------------------------- Study Conclusions  - Left ventricle: The cavity size was normal. There was mild concentric hypertrophy. Systolic function was severely reduced. The estimated ejection fraction was in the range of 25% to 30%. Wall motion was normal; there were no regional wall motion abnormalities. Features are consistent with a pseudonormal left ventricular filling pattern, with concomitant  abnormal relaxation and increased filling pressure (grade 2 diastolic dysfunction). Doppler parameters are consistent with elevated ventricular end-diastolic filling pressure. - Aortic valve: Trileaflet; severely thickened, moderately calcified leaflets. Cusp separation was moderately reduced. There was mild stenosis. Mean gradient (S): 11 mm Hg. Peak gradient (S): 18 mm Hg. Valve area (VTI): 1.56 cm^2. Valve area (Vmax): 1.57 cm^2. Valve area (Vmean): 1.36 cm^2. - Aortic root: The aortic root was normal in size. - Mitral valve: Calcified annulus. Mildly thickened leaflets . There was moderate regurgitation. - Left atrium: The atrium was moderately dilated. - Right ventricle: The cavity size was mildly dilated. Wall thickness was normal. Systolic function was mildly reduced. - Pulmonic valve: There was no regurgitation. - Pulmonary arteries: Systolic pressure was severely increased. PA peak pressure: 63 mm Hg (S). - Inferior vena cava: The vessel was dilated. The respirophasic diameter changes were blunted (< 50%), consistent with elevated central venous pressure. - Pericardium, extracardiac: There was a large left pleural effusion.       Radiology/Studies  Dg Chest 2 View  07/26/2014   CLINICAL DATA:  Difficulty breathing; history of lung carcinoma  EXAM: CHEST  2 VIEW  COMPARISON:  July 23, 2014 chest radiograph and chest CT July 11, 2004  FINDINGS: Port-A-Cath tip is in the superior vena cava. No pneumothorax. The previously noted left upper lobe mass with volume loss persists without appreciable change. There is a moderate effusion on the left. Elsewhere there is generalized interstitial edema. Masses in the right lower lobe seen on recent CT are not well seen by radiography. The heart size is normal. Pulmonary vascularity is within normal limits. There is atherosclerotic change in the aorta. No bone lesions.  IMPRESSION: Persistent left effusion and generalized  interstitial edema. Left upper lobe mass with volume loss persists. Masses in the right lower lobe seen on recent CT are not well seen by radiography. No change in cardiac silhouette. No pneumothorax.   Electronically Signed   By: Lowella Grip M.D.   On: 07/26/2014 09:01   Dg Chest 2 View  07/23/2014   CLINICAL DATA:  Shortness of breath.  EXAM: CHEST  2 VIEW  COMPARISON:  CT 07/11/2014.  Chest x-ray  06/25/2014.  FINDINGS: Prior port catheter in good anatomic position. Mediastinum and stable. Heart size stable. Left upper lobe mass with left upper lobe atelectasis again noted. Left-sided pleural effusion again noted. Small right pleural effusion. No acute bony abnormality.  IMPRESSION: 1. Left upper lobe mass with left upper lobe atelectasis appears stable. 2. Prominent left pleural effusion.  Small right pleural effusion. 3. Power port catheter in stable position.   Electronically Signed   By: Marcello Moores  Register   On: 07/23/2014 10:47   Ct Chest W Contrast  07/11/2014   CLINICAL DATA:  Followup lung cancer.  EXAM: CT CHEST WITH CONTRAST  TECHNIQUE: Multidetector CT imaging of the chest was performed during intravenous contrast administration.  CONTRAST:  88mL  OMNIPAQUE IOHEXOL 300 MG/ML  SOLN  COMPARISON:  03/14/2014  FINDINGS: Mediastinum: There is moderate cardiac enlargement. Calcified atherosclerotic disease involves the thoracic aorta as well as the left main coronary artery, LAD, left circumflex and RCA coronary arteries. No pericardial effusions. No right paratracheal, sub- carinal or right hilar adenopathy identified.  Lungs/Pleura: There is a moderate left pleural effusion which is new from previous exam. Small right pleural effusion is also new from the previous study. Loculated fluid is identified along the oblique fissure of the right lung. The left upper lobe paramediastinal mass measures 3.6 x 8.8 x 8.8 cm. Previously this measured 6.6 x 10.2 x 9.0 cm.  Upper Abdomen: The visualized portions  of the adrenal glands are normal. The visualized portions of the spleen and pancreas are also unremarkable. There is a stone within the gallbladder which measures 1.7 cm. No focal liver abnormality identified.  Musculoskeletal: Review of the visualized bony structures shows no aggressive lytic or sclerotic bone lesion.  IMPRESSION: 1. Interval decrease in size of left upper lobe perihilar/perimediastinal mass compatible with response to therapy.  2. Bilateral pleural effusions, left greater than right. New from previous exam.  3. Atherosclerotic disease including multi vessel coronary artery calcifications.   Electronically Signed   By: Kerby Moors M.D.   On: 07/11/2014 17:16    ASSESSMENT AND PLAN  1. Acute on chronic diastolic HF with new systolic HF  - proBNP > 18299, diuresis limited by hypotension   - echo 04/11/2014 EF 37-16%, grade 1 diastolic dysfunction, mild MR   - post-chemo echo 07/27/2014 EF 96-78%, grade 2 diastolic dysfunc, mild AS, moderate MR, PA peak pressure    63, moderate L pleural effusion  - continue IV lasix with metolazone  2. Moderate L pleural effusion   - echo shows moderate to large L pleural effusion, which is known to be loculated by CT, may need   thoracentesis and or decortication for symptomatic improvement as well as diagnostic purposes to   determined if lung CA has progressed. Would recommend repeat CXR to compare to 10/29 film.  3. CAD   - cath 04/10/2014 70% ostial L main, 80% distal L main, 50% prox LAD, stenosis in Mid and distal RCA   - not a candidate for CABG given advanced lung cancer   - no CP, troponin negative, continue medical therapy  4. DM  5. HTN  6. HLD  7. Squamous cell lung cancer: L lung   - per oncology, s/p XRT and chemo, completed in early Aug 8. PAD: s/p prior R fem-pop bypass  9. H/o PAF with RVR during previous admission, spontaneously resolved, on metoprolol and amiodarone, felt anticoagulation is contraindicated because of  lung CA  [name], MD, [facility] Attending Cardiologist [facility]  HILTY,Kenneth C

## 2014-08-01 DIAGNOSIS — J948 Other specified pleural conditions: Secondary | ICD-10-CM

## 2014-08-01 LAB — BASIC METABOLIC PANEL
Anion gap: 8 (ref 5–15)
BUN: 16 mg/dL (ref 6–23)
CALCIUM: 8.4 mg/dL (ref 8.4–10.5)
CO2: 39 mEq/L — ABNORMAL HIGH (ref 19–32)
CREATININE: 1.2 mg/dL (ref 0.50–1.35)
Chloride: 90 mEq/L — ABNORMAL LOW (ref 96–112)
GFR calc Af Amer: 65 mL/min — ABNORMAL LOW (ref >90)
GFR calc non Af Amer: 56 mL/min — ABNORMAL LOW (ref >90)
Glucose, Bld: 90 mg/dL (ref 70–99)
Potassium: 3.5 mEq/L — ABNORMAL LOW (ref 3.7–5.3)
Sodium: 137 mEq/L (ref 137–147)

## 2014-08-01 LAB — GLUCOSE, CAPILLARY
GLUCOSE-CAPILLARY: 130 mg/dL — AB (ref 70–99)
GLUCOSE-CAPILLARY: 63 mg/dL — AB (ref 70–99)
Glucose-Capillary: 106 mg/dL — ABNORMAL HIGH (ref 70–99)
Glucose-Capillary: 51 mg/dL — ABNORMAL LOW (ref 70–99)
Glucose-Capillary: 243 mg/dL — ABNORMAL HIGH (ref 70–99)
Glucose-Capillary: 341 mg/dL — ABNORMAL HIGH (ref 70–99)

## 2014-08-01 LAB — HEPATIC FUNCTION PANEL
ALBUMIN: 2.6 g/dL — AB (ref 3.5–5.2)
ALK PHOS: 46 U/L (ref 39–117)
ALT: 16 U/L (ref 0–53)
AST: 34 U/L (ref 0–37)
Bilirubin, Direct: 0.2 mg/dL (ref 0.0–0.3)
Indirect Bilirubin: 0.4 mg/dL (ref 0.3–0.9)
Total Bilirubin: 0.6 mg/dL (ref 0.3–1.2)
Total Protein: 6.2 g/dL (ref 6.0–8.3)

## 2014-08-01 LAB — LACTATE DEHYDROGENASE: LDH: 255 U/L — ABNORMAL HIGH (ref 94–250)

## 2014-08-01 MED ORDER — DEXTROSE 50 % IV SOLN
INTRAVENOUS | Status: AC
  Administered 2014-08-01: 50 mL
  Filled 2014-08-01: qty 50

## 2014-08-01 MED ORDER — SODIUM CHLORIDE 0.9 % IJ SOLN
10.0000 mL | INTRAMUSCULAR | Status: DC | PRN
  Administered 2014-08-02 (×2): 10 mL

## 2014-08-01 MED ORDER — ENSURE COMPLETE PO LIQD
237.0000 mL | Freq: Two times a day (BID) | ORAL | Status: DC
  Administered 2014-08-01 – 2014-08-02 (×2): 237 mL via ORAL

## 2014-08-01 NOTE — Progress Notes (Signed)
Called ultrasound and spoke with tech. Was told that patient's thoracentesis will not be done today but has been re-scheduled for early tomorrow am. Previous diet order placed per protocol.

## 2014-08-01 NOTE — Progress Notes (Signed)
[facility] PROGRESS NOTE Interim History: 80 y.o. male With h/o diastolic HF, lung cancer s/p chemoradiation, chronic respiratory failure presents to ED from SNF with dyspnea starting last night. Had recent hospitalization at [facility] for CHF exacerbation, then went to SNF at [facility]. Weight 224 lbs, up from 195 lbs at discharge in July. CXR shows left pleural effusion and vascular congestion. Also with increase LE edema.   Filed Weights   07/30/14 0500 07/31/14 0508 08/01/14 0645  Weight: 93.1 kg (205 lb 4 oz) 90.22 kg (198 lb 14.4 oz) 89.495 kg (197 lb 4.8 oz)        Intake/Output Summary (Last 24 hours) at 08/01/14 1052 Last data filed at 08/01/14 0802  Gross per 24 hour  Intake    680 ml  Output    400 ml  Net    280 ml    Assessment/Plan: Acute on chronic diastolic congestive heart failure:  - Continue daily weights, low sodium diet and strict intake and output. - Resume home dose lasix. - B-blockers (holding it due to acute exacerbation and low BP) and nitrates (holding due to low BP). - Cardiology on board, estimated dry weight. 196 lbs.  Pleural effusion: - IR consulted for drainage. For symptoms improvement.  Acute on chronic resp failure:  - Due to #1. Treatment as mentioned above.  COPD (chronic obstructive pulmonary disease):  - Stable. - Continue home medication regimen and oxygen supplementation (2-3L at baseline).  Squamous cell carcinoma lung: continue follow up with oncology as an outpatient  DM (diabetes mellitus) type II controlled,  - Neurological manifestation: continue SSI, lantus and linagliptin  - Continue neurontin  Depression:  - continue celexa.  GERD:  - continue PPI and sucralfate.    Code Status: Partial (DNI) Family Communication: no family at bedside Disposition Plan: back to SNF for rehab once CHF is compensated.    Consultants:  None   Procedures: ECHO: 04/11/14 - Left ventricle: The cavity  size was normal. Wall thickness was increased in a pattern of mild LVH. Systolic function was normal. The estimated ejection fraction was in the range of 50% to 55%. Wall motion was normal; there were no regional wall motion abnormalities. Doppler parameters are consistent with abnormal left ventricular relaxation (grade 1 diastolic dysfunction).  Antibiotics:  None   HPI/Subjective: No compalins  Objective: Filed Vitals:   07/31/14 1515 07/31/14 1530 07/31/14 2156 08/01/14 0645  BP: 67/36 72/47 82/58  87/57  Pulse: 89 89 88 86  Temp:   97.8 F (36.6 C) 97.9 F (36.6 C)  TempSrc:   Oral Oral  Resp: 18 18 20 18   Height:      Weight:    89.495 kg (197 lb 4.8 oz)  SpO2:   99% 99%     Exam: General: Alert, awake, oriented x3. HEENT: No bruits, no goiter. - JVD Heart: Regular rate;  2+ LE edema bilaterally Lungs: decreased BS bibasilar and fine crackles; no wheezing Abdomen: Soft, nontender, nondistended, positive bowel sounds.   Data Reviewed: Basic Metabolic Panel:  Recent Labs Lab 07/26/14 0601 07/28/14 0640 07/30/14 0445 08/01/14 0500  NA 139 137 137 137  K 3.6* 3.6* 3.9 3.5*  CL 95* 93* 92* 90*  CO2 35* 35* 38* 39*  GLUCOSE 88 91 77 90  BUN 18 14 13 16   CREATININE 1.03 1.04 1.06 1.20  CALCIUM 8.4 8.6 8.3* 8.4   Liver Function Tests:  Recent Labs Lab 08/01/14 0500  AST 34  ALT  16  ALKPHOS 46  BILITOT 0.6  PROT 6.2  ALBUMIN 2.6*   CBC:  Recent Labs Lab 07/28/14 0640  WBC 3.2*  HGB 9.5*  HCT 30.0*  MCV 95.5  PLT 143*   Cardiac Enzymes: No results found for this basename: CKTOTAL, CKMB, CKMBINDEX, TROPONINI,  in the last 168 hours BNP (last 3 results)  Recent Labs  04/10/14 1145 05/13/14 2240 07/23/14 0955  PROBNP 5244.0* 1118.0* 21437.0*   CBG:  Recent Labs Lab 07/31/14 0700 07/31/14 1154 07/31/14 1623 07/31/14 2141 08/01/14 0458  GLUCAP 94 180* 203* 160* 106*    Recent Results (from the past 240 hour(s))  MRSA PCR SCREENING      Status: Abnormal   Collection Time    07/23/14  8:17 PM      Result Value Ref Range Status   MRSA by PCR POSITIVE (*) NEGATIVE Final   Comment:            The GeneXpert MRSA Assay (FDA     approved for NASAL specimens     only), is one component of a     comprehensive MRSA colonization     surveillance program. It is not     intended to diagnose MRSA     infection nor to guide or     monitor treatment for     MRSA infections.     RESULT CALLED TO, READ BACK BY AND VERIFIED WITH:     HARAWAY,J RN [id number] AT 2235 ON [id number] SKEEN,P     Studies: Dg Chest Port 1 View  07/30/2014   CLINICAL DATA:  Pleural effusion.  EXAM: PORTABLE CHEST - 1 VIEW  COMPARISON:  07/26/2014  FINDINGS: Pop or type central venous catheter with tip over the low SVC region. No change in position. Shallow inspiration. Cardiac enlargement with pulmonary vascular congestion and interstitial edema bilaterally. Calcification in the mitral valve annulus. Calcified and tortuous aorta. Left hilar mass with atelectasis in the left upper lung. Moderate size left pleural effusion with basilar atelectasis. No significant change since previous study.  IMPRESSION: Cardiac enlargement with pulmonary vascular congestion and edema. Left hilar mass with atelectasis in the left upper lung. Left pleural effusion with basilar atelectasis. No significant change.   Electronically Signed   By: Lucienne Capers M.D.   On: 07/30/2014 21:22    Scheduled Meds: . amiodarone  200 mg Oral BID  . antiseptic oral rinse  7 mL Mouth Rinse BID  . aspirin  81 mg Oral BID  . atorvastatin  40 mg Oral Daily  . citalopram  20 mg Oral Daily  . docusate sodium  100 mg Oral BID  . enoxaparin (LOVENOX) injection  40 mg Subcutaneous Q24H  . finasteride  5 mg Oral Daily  . furosemide  40 mg Oral Daily  . gabapentin  600 mg Oral BID  . insulin aspart  0-9 Units Subcutaneous TID WC  . insulin glargine  22 Units Subcutaneous QHS  . linagliptin  5 mg Oral Q  supper  . metolazone  2.5 mg Oral Daily  . pantoprazole  40 mg Oral Daily  . potassium chloride  40 mEq Oral Daily  . sucralfate  1 g Oral QID  . tamsulosin  0.4 mg Oral QPC supper   Time: > 35 minutes   FELIZ Garlan Fillers [facility] Pager [phone] If 8PM-8AM, please contact night-coverage at [email or web address], password [facility] 08/01/2014, 10:52 AM  LOS: 9 days

## 2014-08-01 NOTE — Progress Notes (Signed)
Occupational Therapy Treatment Patient Details Name: [name] [name] MRN: [id number] DOB: 01-Jul-1933 Today's Date: 08/01/2014    History of present illness With h/o diastolic HF, lung cancer s/p chemoradiation, chronic respiratory failure presents to ED from SNF with dyspnea starting last night. Had recent hospitalization at [facility] for CHF exacerbation, then went to SNF at [facility]. Weight 224 lbs, up from 193 lbs in July.  CXR shows left pleural effusion. Present on CT scan earlier this month (with interval decrease in size of left upper lobe perihilar/perimediastinal mass compatible with response to therapy). proBNP 21,000 (1000 in July). Echo 6/15 EF 03-47%, grade 1 diastolic dysfunction. C/o worsening leg edema.   OT comments  Pt. Seen for tub transfer   Pt. Currently performing at MOD I level with all ADLS.  Able to complete simulated tub transfer with S.  Pt. Had no further questions or concerns.  Will alert evaluating OT that pt. Has met all goals and is ready for d/c from acute OT rehab services.    Follow Up Recommendations  Home health OT                 Precautions / Restrictions Precautions Precautions: None Precaution Comments: monitor 02 sats                                             ADL Overall ADL's : Needs assistance/impaired                                 Tub/ Shower Transfer: English as a second language teacher Details (indicate cue type and reason): pt. side steps over tub height into shower Functional mobility during ADLs: Supervision/safety General ADL Comments: pt. reports having tub at home.  able to demonstrate simulated tub height and side step over with both L and R in/out of tub with no lob noted                                                                                                    Pertinent Vitals/ Pain       Pain Assessment: No/denies  pain                                                          Frequency Min 2X/week     Progress Toward Goals  OT Goals(current goals can now be found in the care plan section)  Progress towards OT goals: Goals met/education completed, patient discharged from [place] Discharge plan remains appropriate                     End of Session     Activity Tolerance Patient tolerated treatment well  Patient Left in bed;with call bell/phone within reach             Time: 1345-1400 OT Time Calculation (min): 15 min  Charges: OT General Charges $OT Visit: 1 Procedure OT Treatments $Self Care/Home Management : 8-22 mins  Janice Coffin, COTA/L 08/01/2014, 3:25 PM

## 2014-08-01 NOTE — Progress Notes (Signed)
I spoke with PCP. Pt is for thoracentesis today. Problems outlined by Dr Debara Pickett 07/31/14. We will be available as needed.   Kerin Ransom PA-C 08/01/2014 9:01 AM

## 2014-08-01 NOTE — Progress Notes (Signed)
Hypoglycemic Event   CBG: 63  Treatment: 15 GM carbohydrate snack and dinner.  Symptoms: None  Follow-up CBG: Time:1849 CBG Result:243  Possible Reasons for Event: Inadequate meal intake  Comments/MD notified: Patient has history of low blood glucose. Pt.was initially NPO for thoracentesis but it  was rescheduled for tomorrow.    Santina Trillo R  Remember to initiate Hypoglycemia Order Set & complete

## 2014-08-01 NOTE — Progress Notes (Signed)
Hypoglycemic Event  CBG: 51  Treatment: D50 IV 50 mL  Symptoms: None  Follow-up CBG: Time:1224 CBG Result:130  Possible Reasons for Event: Inadequate meal intake. Pt has been NPO for thoracentesis today.   Comments/MD notified:Bryan, PA. Called and notified.    [name] [name]  Remember to initiate Hypoglycemia Order Set & complete

## 2014-08-01 NOTE — Progress Notes (Signed)
INITIAL NUTRITION ASSESSMENT  DOCUMENTATION CODES Per approved criteria  -Not Applicable   INTERVENTION: - Ensure Complete po BID, each supplement provides 350 kcal and 13 grams of protein - RD will continue to monitor  NUTRITION DIAGNOSIS: Increased nutrient needs related to multiple medical problems including chronic respiratory failure and lung cancer as evidenced by suspected weight loss other than fluid loss.   Goal: Pt to meet >/= 90% of their estimated nutrition needs   Monitor:  Weight trend, po intake, acceptance of supplements, labs  Reason for Assessment: MST  80 y.o. male  Admitting Dx: Acute on chronic combined systolic and diastolic congestive heart failure, NYHA class 4  ASSESSMENT: 80 y.o. male with h/o diastolic HF, lung cancer s/p chemoradiation, chronic respiratory failure presents to ED from SNF with dyspnea starting last night. Had recent hospitalization at [facility] for CHF exacerbation, then went to SNF at [facility]. Weight 224 lbs, up from 193 lbs in July.  - Pt eating 100% of meals prior to NPO. His dry weight is recorded in chart per MD as 196 lbs. Pt says that he has been eating well. He says he is hungry "all the time." - Per MD note, pt still has signs of fluid overload including 2++ Le bilateral edema.  - Pt with no signs of fat or muscle wasting.   Labs: CBGs: 106-203  Na WNL K low BUN WNL Albumin low  Height: Ht Readings from Last 1 Encounters:  07/23/14 5' 11.5" (1.816 m)    Weight: Wt Readings from Last 1 Encounters:  08/01/14 197 lb 4.8 oz (89.495 kg)    Ideal Body Weight: 172 lbs  % Ideal Body Weight: 115%  Wt Readings from Last 10 Encounters:  08/01/14 197 lb 4.8 oz (89.495 kg)  07/14/14 217 lb (98.431 kg)  07/12/14 220 lb 3.2 oz (99.882 kg)  07/12/14 213 lb (96.616 kg)  07/06/14 217 lb (98.431 kg)  07/05/14 210 lb (95.255 kg)  07/01/14 215 lb (97.523 kg)  06/21/14 206 lb 14.4 oz (93.849 kg)  06/02/14  202 lb 3.2 oz (91.717 kg)  05/30/14 198 lb 14.4 oz (90.22 kg)    Usual Body Weight: 196 lbs  % Usual Body Weight: 100%  BMI:  Body mass index is 27.14 kg/(m^2).  Estimated Nutritional Needs: Kcal: 2200-2400 Protein: 120-130 g Fluid: 2.0 L/day  Skin: intact  Diet Order: NPO  EDUCATION NEEDS: -Education needs addressed   Intake/Output Summary (Last 24 hours) at 08/01/14 1142 Last data filed at 08/01/14 0802  Gross per 24 hour  Intake    680 ml  Output    400 ml  Net    280 ml    Last BM: 10/1   Labs:   Recent Labs Lab 07/28/14 0640 07/30/14 0445 08/01/14 0500  NA 137 137 137  K 3.6* 3.9 3.5*  CL 93* 92* 90*  CO2 35* 38* 39*  BUN 14 13 16   CREATININE 1.04 1.06 1.20  CALCIUM 8.6 8.3* 8.4  GLUCOSE 91 77 90    CBG (last 3)   Recent Labs  07/31/14 2141 08/01/14 0458 08/01/14 1106  GLUCAP 160* 106* 51*    Scheduled Meds: . amiodarone  200 mg Oral BID  . antiseptic oral rinse  7 mL Mouth Rinse BID  . aspirin  81 mg Oral BID  . atorvastatin  40 mg Oral Daily  . citalopram  20 mg Oral Daily  . dextrose      . docusate sodium  100 mg Oral BID  . enoxaparin (LOVENOX) injection  40 mg Subcutaneous Q24H  . finasteride  5 mg Oral Daily  . furosemide  40 mg Oral Daily  . gabapentin  600 mg Oral BID  . insulin aspart  0-9 Units Subcutaneous TID WC  . insulin glargine  22 Units Subcutaneous QHS  . linagliptin  5 mg Oral Q supper  . metolazone  2.5 mg Oral Daily  . pantoprazole  40 mg Oral Daily  . potassium chloride  40 mEq Oral Daily  . sucralfate  1 g Oral QID  . tamsulosin  0.4 mg Oral QPC supper    Continuous Infusions:   Past Medical History  Diagnosis Date  . Peripheral arterial disease     s/p Ao Bifem Bypass  . Diabetes mellitus without complication   . CAD (coronary artery disease)     By  CT Scan  . Hyperlipidemia   . Hypertension   . Non Hodgkin's lymphoma 2000, 2010  . Peripheral neuropathy   . COPD (chronic obstructive  pulmonary disease)   . History of lower GI bleeding   . Substance abuse   . Lung cancer, upper lobe     left lung  . AAA (abdominal aortic aneurysm)   . MI (myocardial infarction) 04/10/14    Past Surgical History  Procedure Laterality Date  . Fracture surgery  2009    Left ankle  . Pr vein bypass graft,aorto-fem-pop  2005    Right common femoral to BK popliteal BPG  . Pr vein bypass graft,aorto-fem-pop  01-27-05    Revision of Right fem-pop BPG  . Cardiac catheterization  1990's  . Video bronchoscopy Bilateral 03/14/2014    Procedure: VIDEO BRONCHOSCOPY WITH FLUORO;  Surgeon: Rigoberto Noel, MD;  Location: [place];  Service: Cardiopulmonary;  Laterality: Bilateral;  . Nm myoview ltd  11/2013    Lexiscan.  EF 60%, small fixed inferoapical defect - ? artifact  . Transthoracic echocardiogram  11/2013    EF 60-65%, Gr 1 DD, Mod AS, Sever MAC - no MS.       Laurette Schimke RD, LDN

## 2014-08-02 ENCOUNTER — Inpatient Hospital Stay (HOSPITAL_COMMUNITY): Payer: Medicare Other

## 2014-08-02 LAB — BASIC METABOLIC PANEL
Anion gap: 10 (ref 5–15)
BUN: 16 mg/dL (ref 6–23)
CO2: 37 mEq/L — ABNORMAL HIGH (ref 19–32)
Calcium: 8.3 mg/dL — ABNORMAL LOW (ref 8.4–10.5)
Chloride: 91 mEq/L — ABNORMAL LOW (ref 96–112)
Creatinine, Ser: 0.9 mg/dL (ref 0.50–1.35)
GFR, EST NON AFRICAN AMERICAN: 79 mL/min — AB (ref >90)
Glucose, Bld: 174 mg/dL — ABNORMAL HIGH (ref 70–99)
POTASSIUM: 3.4 meq/L — AB (ref 3.7–5.3)
Sodium: 138 mEq/L (ref 137–147)

## 2014-08-02 LAB — GLUCOSE, CAPILLARY
GLUCOSE-CAPILLARY: 185 mg/dL — AB (ref 70–99)
Glucose-Capillary: 166 mg/dL — ABNORMAL HIGH (ref 70–99)
Glucose-Capillary: 212 mg/dL — ABNORMAL HIGH (ref 70–99)

## 2014-08-02 LAB — PROTEIN, BODY FLUID: Total protein, fluid: 2.6 g/dL

## 2014-08-02 LAB — LACTATE DEHYDROGENASE, PLEURAL OR PERITONEAL FLUID: LD FL: 110 U/L — AB (ref 3–23)

## 2014-08-02 MED ORDER — HEPARIN SOD (PORK) LOCK FLUSH 100 UNIT/ML IV SOLN
500.0000 [IU] | INTRAVENOUS | Status: AC | PRN
  Administered 2014-08-02: 500 [IU]

## 2014-08-02 MED ORDER — PANTOPRAZOLE SODIUM 40 MG PO TBEC: 40.0000 mg | DELAYED_RELEASE_TABLET | Freq: Every day | ORAL | Status: DC

## 2014-08-02 MED ORDER — LIDOCAINE HCL (PF) 1 % IJ SOLN
INTRAMUSCULAR | Status: AC
  Filled 2014-08-02: qty 25

## 2014-08-02 NOTE — Progress Notes (Signed)
Occupational Therapy Discharge Patient Details Name: KELON EASOM MRN: [id number] DOB: October 31, 1932 Today's Date: 08/02/2014 Time:  -     Patient discharged from OT services on 08/01/14 secondary to goals met and no further OT needs identified.  Please see latest therapy progress note for current level of functioning and progress toward goals.    Progress and discharge plan discussed with patient and/or caregiver: Patient/Caregiver agrees with plan.      Malka So 08/02/2014, 8:15 AM  [phone]

## 2014-08-02 NOTE — Progress Notes (Addendum)
Pt family came to pick pt up for [facility].  Pt son/family member spoke  with social worker Butch Penny to verify d/c to NH.  Instructed by SW pt wanted NH at this time.  Pt d/c off floor via w/c & w/02 @3  L/[place] which S.W stated is for pt  To take on d/c that was at bedside.  Transported to NH by family.  Isaura Schiller,RN.

## 2014-08-02 NOTE — Discharge Summary (Signed)
Physician Discharge Summary  [name] [name] DSK:[id number] DOB: 19-Feb-1933 DOA: 07/23/2014  PCP: [name], MD  Admit date: 07/23/2014 Discharge date: 08/02/2014  Time spent: 35 minutes  Recommendations for Outpatient Follow-up:  1. Follow up with cardiology in 1 week. Check weight and Cr, titrate lasix as needed. 2. PCP in 2 weeks, will need referral to Hospice and palliative care.  Discharge Diagnoses:  Principal Problem:   Acute on chronic combined systolic and diastolic congestive heart failure, NYHA class 4 Active Problems:   DM (diabetes mellitus) type II controlled, neurological manifestation   CAD   Squamous cell carcinoma lung   COPD (chronic obstructive pulmonary disease)   Chronic respiratory failure   Acute on chronic diastolic CHF (congestive heart failure), NYHA class 4   PAF (paroxysmal atrial fibrillation)   Discharge Condition: Guarded  Diet recommendation: low sodium  Filed Weights   07/31/14 0508 08/01/14 0645 08/02/14 0532  Weight: 90.22 kg (198 lb 14.4 oz) 89.495 kg (197 lb 4.8 oz) 87.9 kg (193 lb 12.6 oz)    History of present illness:  HPI: [name] [name] is a 80 y.o. male  With h/o diastolic HF, lung cancer s/p chemoradiation, chronic respiratory failure presents to ED from SNF with dyspnea starting last night. Had recent hospitalization at [facility] for CHF exacerbation, then went to SNF at [facility]. Weight 224 lbs, up from 193 lbs in July. CXR shows left pleural effusion. Present on CT scan earlier this month (with interval decrease in size of left upper lobe perihilar/perimediastinal mass compatible with response to therapy). proBNP 21,000 (1000 in July). Echo 6/15 EF 62-03%, grade 1 diastolic dysfunction. C/o worsening leg edema. Denies CP, palpitations.   Hospital Course:  Acute on chronic diastolic congestive heart failure:  - started on aggressively lasix and metolazone. - Continue daily weights, low sodium diet and strict  intake and output.  - once close to estimated dry weight transition to oral home dose lasix - B-blockers (holding it due to acute exacerbation and low BP) and nitrates (holding due to low BP).  - Cardiology on board, estimated dry weight. 196 lbs.   Left Pleural effusion:  - IR consulted for drainage. - Yielded 1.2L of dark yellow fluid.  - Pt tolerated procedure well.  Acute on chronic resp failure:  - Due to #1. Treatment as mentioned above.   COPD (chronic obstructive pulmonary disease):  - Stable.  - Continue home medication regimen and oxygen supplementation (2-3L at baseline).   Squamous cell carcinoma lung:  - continue follow up with oncology as an outpatient   DM (diabetes mellitus) type II controlled,  - Neurological manifestation: continue SSI, lantus and linagliptin  - Continue neurontin   Depression:  - continue celexa.   GERD:  - continue PPI and sucralfate.    Procedures:  CXR ECHO : Systolic function was severely reduced. The estimated ejection fraction was in the range of 25% to 30%. Wall motion was normal; there were no regional wall motion abnormalities. Features are consistent with a pseudonormal left ventricular filling pattern, with concomitant abnormal relaxation and increased filling pressure (grade 2 diastolic dysfunction). Doppler parameters are consistent with elevated ventricular end-diastolic filling pressure.  US guided thoracocentesis.  Consultations:  Carduiology  Discharge Exam: Filed Vitals:   08/02/14 0856  BP: 66/42  Pulse:   Temp:   Resp:     General: A&O x3 Cardiovascular: RRR Respiratory: good air movement CTA B/L  Discharge Instructions You were cared for by a  hospitalist during your hospital stay. If you have any questions about your discharge medications or the care you received while you were in the hospital after you are discharged, you can call the unit and asked to speak with the hospitalist on call if the  hospitalist that took care of you is not available. Once you are discharged, your primary care physician will handle any further medical issues. Please note that NO REFILLS for any discharge medications will be authorized once you are discharged, as it is imperative that you return to your primary care physician (or establish a relationship with a primary care physician if you do not have one) for your aftercare needs so that they can reassess your need for medications and monitor your lab values.  Discharge Instructions   Diet - low sodium heart healthy    Complete by:  As directed      Increase activity slowly    Complete by:  As directed           Current Discharge Medication List    START taking these medications   Details  pantoprazole (PROTONIX) 40 MG tablet Take 1 tablet (40 mg total) by mouth daily. Qty: 30 tablet, Refills: 03      CONTINUE these medications which have NOT CHANGED   Details  amiodarone (PACERONE) 200 MG tablet Take 200 mg by mouth 2 (two) times daily.    aspirin 81 MG tablet Take 81 mg by mouth 2 (two) times daily.     atorvastatin (LIPITOR) 40 MG tablet Take 40 mg by mouth daily.    citalopram (CELEXA) 20 MG tablet Take 20 mg by mouth daily.    finasteride (PROSCAR) 5 MG tablet Take 5 mg by mouth daily.    furosemide (LASIX) 40 MG tablet Take 40 mg by mouth daily.    gabapentin (NEURONTIN) 600 MG tablet Take 600 mg by mouth 2 (two) times daily.     Glucosamine-Chondroit-Vit C-Mn (GLUCOSAMINE CHONDR 1500 COMPLX) CAPS Take 1 capsule by mouth daily.    insulin aspart (NOVOLOG) 100 UNIT/ML injection Inject 5 Units into the skin 2 (two) times daily.    insulin glargine (LANTUS) 100 UNIT/ML injection Inject 0.22 mLs (22 Units total) into the skin at bedtime. Qty: 10 mL, Refills: 11    linagliptin (TRADJENTA) 5 MG TABS tablet Take 1 tablet (5 mg total) by mouth daily. With largest meal Qty: 30 tablet, Refills: 3   Associated Diagnoses: Type 2 diabetes  mellitus with neurological manifestations, uncontrolled    metoprolol tartrate (LOPRESSOR) 25 MG tablet Take 0.5 tablets (12.5 mg total) by mouth 2 (two) times daily. Qty: 30 tablet, Refills: 0    oxyCODONE-acetaminophen (PERCOCET/ROXICET) 5-325 MG per tablet Take one tablet by mouth every 4 hours as needed for pain Qty: 180 tablet, Refills: 0    potassium chloride 20 MEQ TBCR Take 40 mEq by mouth daily. Qty: 30 tablet, Refills: 3   Associated Diagnoses: Acute on chronic diastolic CHF (congestive heart failure), NYHA class 2    sucralfate (CARAFATE) 1 G tablet Take 1 tablet (1 g total) by mouth 4 (four) times daily. Qty: 30 tablet, Refills: 0    Tamsulosin HCl (FLOMAX) 0.4 MG CAPS Take 0.4 mg by mouth daily.       No Known Allergies Follow-up Information   Follow up with PERINI,MARK A, MD In 3 weeks. (hospital follow up)    Specialty:  Internal Medicine   Contact information:   [address] [place] [address] [phone]  Follow up with Kirk Ruths, MD In 1 week. (hospital follow up)    Specialty:  Cardiology   Contact information:   [address] [place] [address] [phone]        The results of significant diagnostics from this hospitalization (including imaging, microbiology, ancillary and laboratory) are listed below for reference.    Significant Diagnostic Studies: Dg Chest 1 View  08/02/2014   CLINICAL DATA:  Status post left-sided thoracentesis  EXAM: CHEST - 1 VIEW  COMPARISON:  July 30, 2014  FINDINGS: Left effusion is smaller post thoracentesis. There is no demonstrable pneumothorax. The superior left hilar mass with volume loss the left remain stable. There is underlying emphysema. There is mild scarring on the right. There is no new lung opacity. Heart is mildly enlarged with pulmonary vascularity within normal limits. There is calcification in the mitral annulus. Port-A-Cath tip is in the superior vena cava.  IMPRESSION:  Left effusion smaller post thoracentesis. No pneumothorax. Areas of lung scarring with underlying emphysema stable. The mass in the superior left hilar region with associated volume loss is grossly stable.   Electronically Signed   By: Lowella Grip M.D.   On: 08/02/2014 09:17   Dg Chest 2 View  07/26/2014   CLINICAL DATA:  Difficulty breathing; history of lung carcinoma  EXAM: CHEST  2 VIEW  COMPARISON:  July 23, 2014 chest radiograph and chest CT July 11, 2004  FINDINGS: Port-A-Cath tip is in the superior vena cava. No pneumothorax. The previously noted left upper lobe mass with volume loss persists without appreciable change. There is a moderate effusion on the left. Elsewhere there is generalized interstitial edema. Masses in the right lower lobe seen on recent CT are not well seen by radiography. The heart size is normal. Pulmonary vascularity is within normal limits. There is atherosclerotic change in the aorta. No bone lesions.  IMPRESSION: Persistent left effusion and generalized interstitial edema. Left upper lobe mass with volume loss persists. Masses in the right lower lobe seen on recent CT are not well seen by radiography. No change in cardiac silhouette. No pneumothorax.   Electronically Signed   By: Lowella Grip M.D.   On: 07/26/2014 09:01   Dg Chest 2 View  07/23/2014   CLINICAL DATA:  Shortness of breath.  EXAM: CHEST  2 VIEW  COMPARISON:  CT 07/11/2014.  Chest x-ray  06/25/2014.  FINDINGS: Prior port catheter in good anatomic position. Mediastinum and stable. Heart size stable. Left upper lobe mass with left upper lobe atelectasis again noted. Left-sided pleural effusion again noted. Small right pleural effusion. No acute bony abnormality.  IMPRESSION: 1. Left upper lobe mass with left upper lobe atelectasis appears stable. 2. Prominent left pleural effusion.  Small right pleural effusion. 3. Power port catheter in stable position.   Electronically Signed   By: Marcello Moores   Register   On: 07/23/2014 10:47   Ct Chest W Contrast  07/11/2014   CLINICAL DATA:  Followup lung cancer.  EXAM: CT CHEST WITH CONTRAST  TECHNIQUE: Multidetector CT imaging of the chest was performed during intravenous contrast administration.  CONTRAST:  39mL OMNIPAQUE IOHEXOL 300 MG/ML  SOLN  COMPARISON:  03/14/2014  FINDINGS: Mediastinum: There is moderate cardiac enlargement. Calcified atherosclerotic disease involves the thoracic aorta as well as the left main coronary artery, LAD, left circumflex and RCA coronary arteries. No pericardial effusions. No right paratracheal, sub- carinal or right hilar adenopathy identified.  Lungs/Pleura: There is a moderate left pleural effusion  which is new from previous exam. Small right pleural effusion is also new from the previous study. Loculated fluid is identified along the oblique fissure of the right lung. The left upper lobe paramediastinal mass measures 3.6 x 8.8 x 8.8 cm. Previously this measured 6.6 x 10.2 x 9.0 cm.  Upper Abdomen: The visualized portions of the adrenal glands are normal. The visualized portions of the spleen and pancreas are also unremarkable. There is a stone within the gallbladder which measures 1.7 cm. No focal liver abnormality identified.  Musculoskeletal: Review of the visualized bony structures shows no aggressive lytic or sclerotic bone lesion.  IMPRESSION: 1. Interval decrease in size of left upper lobe perihilar/perimediastinal mass compatible with response to therapy.  2. Bilateral pleural effusions, left greater than right. New from previous exam.  3. Atherosclerotic disease including multi vessel coronary artery calcifications.   Electronically Signed   By: Kerby Moors M.D.   On: 07/11/2014 17:16   Dg Chest Port 1 View  07/30/2014   CLINICAL DATA:  Pleural effusion.  EXAM: PORTABLE CHEST - 1 VIEW  COMPARISON:  07/26/2014  FINDINGS: Pop or type central venous catheter with tip over the low SVC region. No change in position.  Shallow inspiration. Cardiac enlargement with pulmonary vascular congestion and interstitial edema bilaterally. Calcification in the mitral valve annulus. Calcified and tortuous aorta. Left hilar mass with atelectasis in the left upper lung. Moderate size left pleural effusion with basilar atelectasis. No significant change since previous study.  IMPRESSION: Cardiac enlargement with pulmonary vascular congestion and edema. Left hilar mass with atelectasis in the left upper lung. Left pleural effusion with basilar atelectasis. No significant change.   Electronically Signed   By: Lucienne Capers M.D.   On: 07/30/2014 21:22   US Thoracentesis Asp Pleural Space W/img Guide  08/02/2014   CLINICAL DATA:  Shortness of breath, left-sided pleural effusion. Request diagnostic and therapeutic thoracentesis.  EXAM: ULTRASOUND GUIDED LEFT THORACENTESIS  COMPARISON:  None.  PROCEDURE: An ultrasound guided thoracentesis was thoroughly discussed with the patient and questions answered. The benefits, risks, alternatives and complications were also discussed. The patient understands and wishes to proceed with the procedure. Written consent was obtained.  Ultrasound was performed to localize and mark an adequate pocket of fluid in the left chest. The area was then prepped and draped in the normal sterile fashion. 1% Lidocaine was used for local anesthesia. Under ultrasound guidance a 6 French Safe-T-Centesis catheter was introduced. Thoracentesis was performed. The catheter was removed and a dressing applied.  Complications:  None immediate  FINDINGS: A total of approximately 1.2 L of dark yellow fluid was removed. A fluid sample wassent for laboratory analysis.  IMPRESSION: Successful ultrasound guided left thoracentesis yielding 1.2 L of pleural fluid.  Read by: Ascencion Dike PA-C   Electronically Signed   By: Jacqulynn Cadet M.D.   On: 08/02/2014 09:36    Microbiology: Recent Results (from the past 240 hour(s))  MRSA PCR  SCREENING     Status: Abnormal   Collection Time    07/23/14  8:17 PM      Result Value Ref Range Status   MRSA by PCR POSITIVE (*) NEGATIVE Final   Comment:            The GeneXpert MRSA Assay (FDA     approved for NASAL specimens     only), is one component of a     comprehensive MRSA colonization     surveillance program. It is not  intended to diagnose MRSA     infection nor to guide or     monitor treatment for     MRSA infections.     RESULT CALLED TO, READ BACK BY AND VERIFIED WITH:     HARAWAY,J RN [id number] AT 2235 ON [id number] SKEEN,P     Labs: Basic Metabolic Panel:  Recent Labs Lab 07/28/14 0640 07/30/14 0445 08/01/14 0500 08/02/14 0523  NA 137 137 137 138  K 3.6* 3.9 3.5* 3.4*  CL 93* 92* 90* 91*  CO2 35* 38* 39* 37*  GLUCOSE 91 77 90 174*  BUN 14 13 16 16   CREATININE 1.04 1.06 1.20 0.90  CALCIUM 8.6 8.3* 8.4 8.3*   Liver Function Tests:  Recent Labs Lab 08/01/14 0500  AST 34  ALT 16  ALKPHOS 46  BILITOT 0.6  PROT 6.2  ALBUMIN 2.6*   No results found for this basename: LIPASE, AMYLASE,  in the last 168 hours No results found for this basename: AMMONIA,  in the last 168 hours CBC:  Recent Labs Lab 07/28/14 0640  WBC 3.2*  HGB 9.5*  HCT 30.0*  MCV 95.5  PLT 143*   Cardiac Enzymes: No results found for this basename: CKTOTAL, CKMB, CKMBINDEX, TROPONINI,  in the last 168 hours BNP: BNP (last 3 results)  Recent Labs  04/10/14 1145 05/13/14 2240 07/23/14 0955  PROBNP 5244.0* 1118.0* 21437.0*   CBG:  Recent Labs Lab 08/01/14 1620 08/01/14 1849 08/01/14 2156 08/02/14 0550 08/02/14 1144  GLUCAP 63* 243* 341* 166* 212*       Signed:  Charlynne Cousins  [facility] 08/02/2014, 11:51 AM

## 2014-08-02 NOTE — Progress Notes (Signed)
Inpatient Diabetes Program Recommendations  AACE/ADA: New Consensus Statement on Inpatient Glycemic Control (2013)  Target Ranges:  Prepandial:   less than 140 mg/dL      Peak postprandial:   less than 180 mg/dL (1-2 hours)      Critically ill patients:  140 - 180 mg/dL   Please lower the lantus to 15 units: please read note below: Inpatient Diabetes Program Recommendations Insulin - Basal: Pt having fasting hypoglycemia therefore pt is not getting correction for breakfast and by lunch time and supper time, the glucose is high, then corrected. When lantus at dose of 22 units, the next day fastings are low again and so goes the cycle Please lower basal lantus to 15 units to avoid fasting hypoglycemia  Thank you, Rosita Kea, RN, CNS, Diabetes Coordinator [phone])

## 2014-08-02 NOTE — Procedures (Signed)
Successful US guided left thoracentesis. Yielded 1.2L of dark yellow fluid. Pt tolerated procedure well. No immediate complications.  Specimen was sent for labs. CXR ordered.  Ascencion Dike PA-C 08/02/2014 8:59 AM

## 2014-08-02 NOTE — Progress Notes (Signed)
Report called Blanch Media at   [facility].  Verbalized understanding.  Karie Kirks, Therapist, sports.

## 2014-08-03 ENCOUNTER — Telehealth: Payer: Self-pay

## 2014-08-03 ENCOUNTER — Other Ambulatory Visit: Payer: Self-pay

## 2014-08-03 NOTE — Telephone Encounter (Signed)
Called to check on pt, spoke with Nicole Kindred, pt was discharged yesterday after fluids and sent to [facility]. Rescheduled pt appt for Monday 08/08/14 for 2 pm labs and 215 pm with Ned Card, NP. Nicole Kindred verbalized understanding, informed him we would call if there were any changes. Informed him to call with any questions or concerns.

## 2014-08-03 NOTE — Telephone Encounter (Signed)
Called and rescheduled pt appt to 10/26 per Ned Card, NP request. Called and confirmed pt appt for 2pm labs and 215 appt on 08/22/14 with Nicole Kindred. Nicole Kindred verbalized understanding

## 2014-08-05 ENCOUNTER — Non-Acute Institutional Stay (SKILLED_NURSING_FACILITY): Payer: Medicare Other | Admitting: Internal Medicine

## 2014-08-05 ENCOUNTER — Encounter: Payer: Self-pay | Admitting: Internal Medicine

## 2014-08-05 DIAGNOSIS — J9610 Chronic respiratory failure, unspecified whether with hypoxia or hypercapnia: Secondary | ICD-10-CM

## 2014-08-05 DIAGNOSIS — K219 Gastro-esophageal reflux disease without esophagitis: Secondary | ICD-10-CM | POA: Insufficient documentation

## 2014-08-05 DIAGNOSIS — I480 Paroxysmal atrial fibrillation: Secondary | ICD-10-CM

## 2014-08-05 DIAGNOSIS — I509 Heart failure, unspecified: Secondary | ICD-10-CM

## 2014-08-05 DIAGNOSIS — N400 Enlarged prostate without lower urinary tract symptoms: Secondary | ICD-10-CM

## 2014-08-05 DIAGNOSIS — F32A Depression, unspecified: Secondary | ICD-10-CM

## 2014-08-05 DIAGNOSIS — E1149 Type 2 diabetes mellitus with other diabetic neurological complication: Secondary | ICD-10-CM

## 2014-08-05 DIAGNOSIS — F329 Major depressive disorder, single episode, unspecified: Secondary | ICD-10-CM

## 2014-08-05 DIAGNOSIS — R531 Weakness: Secondary | ICD-10-CM

## 2014-08-05 NOTE — Progress Notes (Addendum)
Patient ID: [name] [name], male   DOB: 04-Jun-1933, 80 y.o.   MRN: [id number]     Facility: [facility]   PCP: [name], MD  Code Status: full code  No Known Allergies  Chief Complaint: new admission  HPI:  80 y/o male patient is here after hospital admission from 07/23/14-08/02/14 with worsening dyspnea and weight gain. cxr showed left sided pleural effusion and proBNP was elevated. He was admitted with chf exacerbation and was started on lasix and metolazone. He diuresed well. b blocker and nitrates were held with low bp. IR was consulted and he had thoracocentesis. He clinically made some improvement and was sent back to SNF He has history of lung cancer s/p chemo and radiation, COPD, CHF, chronic respiratory failure on continuous oxygen, DM, CAD, afib  He is seen in his room. He has worked with therapy team and gets tired post exertion. His breathing is otherwise stable. Denies chest pain. He gets tired easily. As per staff has gained several pounds since admission He mentions that he would like everything possible done if he falls ill On review of vital signs has low bp reading. Repeat bp check 94/61  Review of Systems:  Constitutional: Negative for fever, chills, diaphoresis.  HENT: Negative for congestion Eyes: Negative for blurred vision, discharge.  Respiratory: Negative for cough, sputum production, wheezing.   Cardiovascular: Negative for chest pain, palpitations, orthopnea Gastrointestinal: Negative for heartburn, nausea, vomiting, abdominal pain.  Genitourinary: Negative for dysuria Musculoskeletal: Negative for back pain, falls  Skin: Negative for itching and rash.  Neurological: Negative for dizziness, tingling, headaches.  Psychiatric/Behavioral: Negative for depression.     Past Medical History  Diagnosis Date  . Peripheral arterial disease     s/p Ao Bifem Bypass  . Diabetes mellitus without complication   . CAD (coronary artery disease)      By  CT Scan  . Hyperlipidemia   . Hypertension   . Non Hodgkin's lymphoma 2000, 2010  . Peripheral neuropathy   . COPD (chronic obstructive pulmonary disease)   . History of lower GI bleeding   . Substance abuse   . Lung cancer, upper lobe     left lung  . AAA (abdominal aortic aneurysm)   . MI (myocardial infarction) 04/10/14   Past Surgical History  Procedure Laterality Date  . Fracture surgery  2009    Left ankle  . Pr vein bypass graft,aorto-fem-pop  2005    Right common femoral to BK popliteal BPG  . Pr vein bypass graft,aorto-fem-pop  01-27-05    Revision of Right fem-pop BPG  . Cardiac catheterization  1990's  . Video bronchoscopy Bilateral 03/14/2014    Procedure: VIDEO BRONCHOSCOPY WITH FLUORO;  Surgeon: Rigoberto Noel, MD;  Location: [facility];  Service: Cardiopulmonary;  Laterality: Bilateral;  . Nm myoview ltd  11/2013    Lexiscan.  EF 60%, small fixed inferoapical defect - ? artifact  . Transthoracic echocardiogram  11/2013    EF 60-65%, Gr 1 DD, Mod AS, Sever MAC - no MS.      Social History:   reports that he quit smoking about 10 years ago. His smoking use included Cigarettes. He has a 50 pack-year smoking history. He has never used smokeless tobacco. He reports that he does not drink alcohol or use illicit drugs.  Family History  Problem Relation Age of Onset  . Cancer Mother     Colon  . Heart attack Mother   . Stroke Father   .  Hyperlipidemia Father   . Hyperlipidemia Sister   . Hyperlipidemia Brother   . Hyperlipidemia Daughter   . Hypertension Son     Medications: Patient's Medications  New Prescriptions   No medications on file  Previous Medications   AMIODARONE (PACERONE) 200 MG TABLET    Take 200 mg by mouth 2 (two) times daily.   ASPIRIN 81 MG TABLET    Take 81 mg by mouth 2 (two) times daily.    ATORVASTATIN (LIPITOR) 40 MG TABLET    Take 40 mg by mouth daily.   CITALOPRAM (CELEXA) 20 MG TABLET    Take 20 mg by mouth daily.    FINASTERIDE (PROSCAR) 5 MG TABLET    Take 5 mg by mouth daily.   FUROSEMIDE (LASIX) 40 MG TABLET    Take 40 mg by mouth daily.   GABAPENTIN (NEURONTIN) 600 MG TABLET    Take 600 mg by mouth 2 (two) times daily.    GLUCOSAMINE-CHONDROIT-VIT C-MN (GLUCOSAMINE CHONDR 1500 COMPLX) CAPS    Take 1 capsule by mouth daily.   INSULIN ASPART (NOVOLOG) 100 UNIT/ML INJECTION    Inject 5 Units into the skin 2 (two) times daily.   INSULIN GLARGINE (LANTUS) 100 UNIT/ML INJECTION    Inject 0.22 mLs (22 Units total) into the skin at bedtime.   LINAGLIPTIN (TRADJENTA) 5 MG TABS TABLET    Take 1 tablet (5 mg total) by mouth daily. With largest meal   METOPROLOL TARTRATE (LOPRESSOR) 25 MG TABLET    Take 0.5 tablets (12.5 mg total) by mouth 2 (two) times daily.   OXYCODONE-ACETAMINOPHEN (PERCOCET/ROXICET) 5-325 MG PER TABLET    Take one tablet by mouth every 4 hours as needed for pain   PANTOPRAZOLE (PROTONIX) 40 MG TABLET    Take 1 tablet (40 mg total) by mouth daily.   POTASSIUM CHLORIDE 20 MEQ TBCR    Take 40 mEq by mouth daily.   SUCRALFATE (CARAFATE) 1 G TABLET    Take 1 tablet (1 g total) by mouth 4 (four) times daily.   TAMSULOSIN HCL (FLOMAX) 0.4 MG CAPS    Take 0.4 mg by mouth daily.  Modified Medications   No medications on file  Discontinued Medications   No medications on file     Physical Exam: Filed Vitals:   08/05/14 0900  BP: 87/51  Pulse: 70  Temp: 97.7 F (36.5 C)  Resp: 18  Height: 5\' 11"  (1.803 m)  Weight: 202 lb (91.627 kg)   Wt Readings from Last 3 Encounters:  08/05/14 202 lb (91.627 kg)  08/02/14 193 lb 12.6 oz (87.9 kg)  07/14/14 217 lb (98.431 kg)   General- elderly male in no acute distress Head- atraumatic, normocephalic Eyes- no pallor, no icterus, no discharge Neck- no lymphadenopathy, no thyromegaly, no jugular vein distension Throat- moist mucus membrane Cardiovascular- normal s1,s2, no murmurs Respiratory- bilateral clear to auscultation, no wheeze, no rhonchi,  no crackles, no use of accessory muscles, on o2 Abdomen- bowel sounds present, soft, non tender Musculoskeletal- able to move all 4 extremities, no spinal and paraspinal tenderness, leg edema 1+ Neurological- no focal deficit Skin- warm and dry Psychiatry- alert and oriented to person, place and time, normal mood and affect    Labs reviewed: Basic Metabolic Panel:  Recent Labs  04/14/14 0525 04/15/14 0525 04/16/14 0508  07/30/14 0445 08/01/14 0500 08/02/14 0523  NA 132* 128* 133*  < > 137 137 138  K 4.3 4.2 4.6  < > 3.9 3.5* 3.4*  CL 92* 89* 94*  < > 92* 90* 91*  CO2 30 28 28   < > 38* 39* 37*  GLUCOSE 252* 249* 257*  < > 77 90 174*  BUN 19 19 19   < > 13 16 16   CREATININE 0.75 0.69 0.76  < > 1.06 1.20 0.90  CALCIUM 9.1 9.0 9.8  < > 8.3* 8.4 8.3*  MG 1.5 1.3* 1.4*  --   --   --   --   < > = values in this interval not displayed. Liver Function Tests:  Recent Labs  07/08/14 0525 07/23/14 0955 08/01/14 0500  AST 27 23 34  ALT 45 19 16  ALKPHOS 46 39 46  BILITOT 0.7 0.7 0.6  PROT 5.7* 5.8* 6.2  ALBUMIN 2.9* 2.6* 2.6*   No results found for this basename: LIPASE, AMYLASE,  in the last 8760 hours No results found for this basename: AMMONIA,  in the last 8760 hours CBC:  Recent Labs  07/08/14 0525 07/09/14 0740 07/23/14 0955 07/28/14 0640  WBC 5.2 6.7 3.5* 3.2*  NEUTROABS 3.2 4.7 2.2  --   HGB 9.6* 10.0* 9.5* 9.5*  HCT 29.8* 31.5* 29.7* 30.0*  MCV 96.0 94.9 92.8 95.5  PLT 123* 142* 146* 143*   Cardiac Enzymes:  Recent Labs  03/22/14 0447 04/10/14 1145 07/23/14 0955  TROPONINI <0.30 0.55* <0.30   BNP: No components found with this basename: POCBNP,  CBG:  Recent Labs  08/02/14 0550 08/02/14 1144 08/02/14 1635  GLUCAP 166* 212* 185*    Radiological Exams: Dg Chest 1 View  08/02/2014   CLINICAL DATA:  Status post left-sided thoracentesis  EXAM: CHEST - 1 VIEW  COMPARISON:  July 30, 2014  FINDINGS: Left effusion is smaller post thoracentesis.  There is no demonstrable pneumothorax. The superior left hilar mass with volume loss the left remain stable. There is underlying emphysema. There is mild scarring on the right. There is no new lung opacity. Heart is mildly enlarged with pulmonary vascularity within normal limits. There is calcification in the mitral annulus. Port-A-Cath tip is in the superior vena cava.  IMPRESSION: Left effusion smaller post thoracentesis. No pneumothorax. Areas of lung scarring with underlying emphysema stable. The mass in the superior left hilar region with associated volume loss is grossly stable.   Electronically Signed   By: Lowella Grip M.D.   On: 08/02/2014 09:17   Dg Chest 2 View  07/26/2014   CLINICAL DATA:  Difficulty breathing; history of lung carcinoma  EXAM: CHEST  2 VIEW  COMPARISON:  July 23, 2014 chest radiograph and chest CT July 11, 2004  FINDINGS: Port-A-Cath tip is in the superior vena cava. No pneumothorax. The previously noted left upper lobe mass with volume loss persists without appreciable change. There is a moderate effusion on the left. Elsewhere there is generalized interstitial edema. Masses in the right lower lobe seen on recent CT are not well seen by radiography. The heart size is normal. Pulmonary vascularity is within normal limits. There is atherosclerotic change in the aorta. No bone lesions.  IMPRESSION: Persistent left effusion and generalized interstitial edema. Left upper lobe mass with volume loss persists. Masses in the right lower lobe seen on recent CT are not well seen by radiography. No change in cardiac silhouette. No pneumothorax.   Electronically Signed   By: Lowella Grip M.D.   On: 07/26/2014 09:01   Dg Chest 2 View  07/23/2014   CLINICAL DATA:  Shortness of breath.  EXAM: CHEST  2 VIEW  COMPARISON:  CT 07/11/2014.  Chest x-ray  06/25/2014.  FINDINGS: Prior port catheter in good anatomic position. Mediastinum and stable. Heart size stable. Left upper lobe  mass with left upper lobe atelectasis again noted. Left-sided pleural effusion again noted. Small right pleural effusion. No acute bony abnormality.  IMPRESSION: 1. Left upper lobe mass with left upper lobe atelectasis appears stable. 2. Prominent left pleural effusion.  Small right pleural effusion. 3. Power port catheter in stable position.   Electronically Signed   By: Marcello Moores  Register   On: 07/23/2014 10:47   Ct Chest W Contrast  07/11/2014   CLINICAL DATA:  Followup lung cancer.  EXAM: CT CHEST WITH CONTRAST  TECHNIQUE: Multidetector CT imaging of the chest was performed during intravenous contrast administration.  CONTRAST:  76mL OMNIPAQUE IOHEXOL 300 MG/ML  SOLN  COMPARISON:  03/14/2014  FINDINGS: Mediastinum: There is moderate cardiac enlargement. Calcified atherosclerotic disease involves the thoracic aorta as well as the left main coronary artery, LAD, left circumflex and RCA coronary arteries. No pericardial effusions. No right paratracheal, sub- carinal or right hilar adenopathy identified.  Lungs/Pleura: There is a moderate left pleural effusion which is new from previous exam. Small right pleural effusion is also new from the previous study. Loculated fluid is identified along the oblique fissure of the right lung. The left upper lobe paramediastinal mass measures 3.6 x 8.8 x 8.8 cm. Previously this measured 6.6 x 10.2 x 9.0 cm.  Upper Abdomen: The visualized portions of the adrenal glands are normal. The visualized portions of the spleen and pancreas are also unremarkable. There is a stone within the gallbladder which measures 1.7 cm. No focal liver abnormality identified.  Musculoskeletal: Review of the visualized bony structures shows no aggressive lytic or sclerotic bone lesion.  IMPRESSION: 1. Interval decrease in size of left upper lobe perihilar/perimediastinal mass compatible with response to therapy.  2. Bilateral pleural effusions, left greater than right. New from previous exam.  3.  Atherosclerotic disease including multi vessel coronary artery calcifications.   Electronically Signed   By: Kerby Moors M.D.   On: 07/11/2014 17:16   Dg Chest Port 1 View  07/30/2014   CLINICAL DATA:  Pleural effusion.  EXAM: PORTABLE CHEST - 1 VIEW  COMPARISON:  07/26/2014  FINDINGS: Pop or type central venous catheter with tip over the low SVC region. No change in position. Shallow inspiration. Cardiac enlargement with pulmonary vascular congestion and interstitial edema bilaterally. Calcification in the mitral valve annulus. Calcified and tortuous aorta. Left hilar mass with atelectasis in the left upper lung. Moderate size left pleural effusion with basilar atelectasis. No significant change since previous study.  IMPRESSION: Cardiac enlargement with pulmonary vascular congestion and edema. Left hilar mass with atelectasis in the left upper lung. Left pleural effusion with basilar atelectasis. No significant change.   Electronically Signed   By: Lucienne Capers M.D.   On: 07/30/2014 21:22   US Thoracentesis Asp Pleural Space W/img Guide  08/02/2014   CLINICAL DATA:  Shortness of breath, left-sided pleural effusion. Request diagnostic and therapeutic thoracentesis.  EXAM: ULTRASOUND GUIDED LEFT THORACENTESIS  COMPARISON:  None.  PROCEDURE: An ultrasound guided thoracentesis was thoroughly discussed with the patient and questions answered. The benefits, risks, alternatives and complications were also discussed. The patient understands and wishes to proceed with the procedure. Written consent was obtained.  Ultrasound was performed to localize and mark an adequate pocket of fluid in the left chest. The area was then prepped and draped  in the normal sterile fashion. 1% Lidocaine was used for local anesthesia. Under ultrasound guidance a 6 French Safe-T-Centesis catheter was introduced. Thoracentesis was performed. The catheter was removed and a dressing applied.  Complications:  None immediate  FINDINGS:  A total of approximately 1.2 L of dark yellow fluid was removed. A fluid sample wassent for laboratory analysis.  IMPRESSION: Successful ultrasound guided left thoracentesis yielding 1.2 L of pleural fluid.  Read by: Ascencion Dike PA-C   Electronically Signed   By: Jacqulynn Cadet M.D.   On: 08/02/2014 09:36    Assessment/Plan  Generalized weakness Will have him work with physical therapy and occupational therapy team to help with gait training and muscle strengthening exercises.fall precautions. Skin care. Encourage to be out of bed.   chf Increased weight, edema on exam. increase lasix to 40 mg bid, monitor daily weight and check bmp. Continue kcl 40 meq daily  Chronic respiratory failure Continue o2 for now, monitor clinically  Hypotension Asymptomatic, on metoprolol 12.5 bid and dosing of lasix increased which can worsen this. Thus decrease metoprolol to 12.5 mg daily only and reassess. Check bp daily  afib Rate controlled. Changed metoprolol to 12.5 mg daily and continue amiodarone 200 mg bid and aspirin with statin  Depression Stable, continue citalopram 20 mg daily  gerd Continue his carafate and pantoprazole 40 mg daily  DM with neurological complications Monitor cbg, continue lantus 22 u daily, tradjenta 5 mg daily and novolog 5 u with meals. Continue gabapentin 600 mg bid. Continue lipitor and ASA  BPH Continue flomax and finasteride  Family/ staff Communication: reviewed care plan with patient and nursing supervisor   Goals of care: short term rehabilitation    Labs/tests ordered: cbc, cmp    Blanchie Serve, MD  [facility] [phone] (Monday-Friday 8 am - 5 pm) [phone] (afterhours)

## 2014-08-08 ENCOUNTER — Other Ambulatory Visit: Payer: Medicare Other

## 2014-08-08 ENCOUNTER — Ambulatory Visit: Payer: Medicare Other | Admitting: Nurse Practitioner

## 2014-08-09 ENCOUNTER — Ambulatory Visit: Payer: Medicare Other | Admitting: Nurse Practitioner

## 2014-08-12 ENCOUNTER — Non-Acute Institutional Stay (SKILLED_NURSING_FACILITY): Payer: Medicare Other | Admitting: Internal Medicine

## 2014-08-12 DIAGNOSIS — I509 Heart failure, unspecified: Secondary | ICD-10-CM

## 2014-08-12 DIAGNOSIS — E1149 Type 2 diabetes mellitus with other diabetic neurological complication: Secondary | ICD-10-CM

## 2014-08-12 DIAGNOSIS — C3492 Malignant neoplasm of unspecified part of left bronchus or lung: Secondary | ICD-10-CM

## 2014-08-12 DIAGNOSIS — J9610 Chronic respiratory failure, unspecified whether with hypoxia or hypercapnia: Secondary | ICD-10-CM

## 2014-08-12 NOTE — Progress Notes (Signed)
Patient ID: [name] [name], male   DOB: Aug 29, 1933, 80 y.o.   MRN: [id number]     Facility: [facility]  Chief complaint- discharge visit  Allergies reviewed  HPI 80 y/o patient is seen today for discharge visit. Patient was here for short term rehabilitation and has worked with therapy team.  He has history of lung cancer s/p chemo and radiation, COPD, CHF, chronic respiratory failure on continuous oxygen, DM, CAD, afib   He is seen in his room. He has worked with therapy team. He is on o2. Breathing is stable. Weight has been stable, 1 lbs gain since last visit. Reviewed his meds. chf currently stable. bp and blood sugar are stable.  Wt Readings from Last 3 Encounters:  08/12/14 203 lb (92.08 kg)  08/05/14 202 lb (91.627 kg)  08/02/14 193 lb 12.6 oz (87.9 kg)    Review of Systems Constitutional: Negative for fever, chills, diaphoresis.  HENT: Negative for congestion Eyes: Negative for blurred vision, discharge.  Respiratory: Negative for cough, sputum production, wheezing.   Cardiovascular: Negative for chest pain, palpitations Gastrointestinal: Negative for heartburn, nausea, vomiting, abdominal pain.  Genitourinary: Negative for dysuria Musculoskeletal: Negative for back pain, falls  Skin: Negative for itching and rash.  Neurological: Negative for dizziness, tingling, headaches.  Psychiatric/Behavioral: Negative for depression.    Past Medical History  Diagnosis Date  . Peripheral arterial disease     s/p Ao Bifem Bypass  . Diabetes mellitus without complication   . CAD (coronary artery disease)     By  CT Scan  . Hyperlipidemia   . Hypertension   . Non Hodgkin's lymphoma 2000, 2010  . Peripheral neuropathy   . COPD (chronic obstructive pulmonary disease)   . History of lower GI bleeding   . Substance abuse   . Lung cancer, upper lobe     left lung  . AAA (abdominal aortic aneurysm)   . MI (myocardial infarction) 04/10/14    Medication  reviewed. See [facility]   Medication List       This list is accurate as of: 08/12/14  1:22 PM.  Always use your most recent med list.               amiodarone 200 MG tablet  Commonly known as:  PACERONE  Take 200 mg by mouth 2 (two) times daily.     aspirin 81 MG tablet  Take 81 mg by mouth 2 (two) times daily.     atorvastatin 40 MG tablet  Commonly known as:  LIPITOR  Take 40 mg by mouth daily.     citalopram 20 MG tablet  Commonly known as:  CELEXA  Take 20 mg by mouth daily.     finasteride 5 MG tablet  Commonly known as:  PROSCAR  Take 5 mg by mouth daily.     furosemide 40 MG tablet  Commonly known as:  LASIX  Take 40 mg by mouth daily.     gabapentin 600 MG tablet  Commonly known as:  NEURONTIN  Take 600 mg by mouth 2 (two) times daily.     GLUCOSAMINE CHONDR 1500 COMPLX Caps  Take 1 capsule by mouth daily.     insulin aspart 100 UNIT/ML injection  Commonly known as:  novoLOG  Inject 5 Units into the skin 2 (two) times daily.     insulin glargine 100 UNIT/ML injection  Commonly known as:  LANTUS  Inject 0.22 mLs (22 Units total) into the skin at bedtime.  linagliptin 5 MG Tabs tablet  Commonly known as:  TRADJENTA  Take 1 tablet (5 mg total) by mouth daily. With largest meal     metoprolol tartrate 25 MG tablet  Commonly known as:  LOPRESSOR  Take 12.5 mg by mouth daily.     oxyCODONE-acetaminophen 5-325 MG per tablet  Commonly known as:  PERCOCET/ROXICET  Take one tablet by mouth every 4 hours as needed for pain     pantoprazole 40 MG tablet  Commonly known as:  PROTONIX  Take 1 tablet (40 mg total) by mouth daily.     Potassium Chloride ER 20 MEQ Tbcr  Take 40 mEq by mouth daily.     sucralfate 1 G tablet  Commonly known as:  CARAFATE  Take 1 tablet (1 g total) by mouth 4 (four) times daily.     tamsulosin 0.4 MG Caps capsule  Commonly known as:  FLOMAX  Take 0.4 mg by mouth daily.         Physical exam BP 137/60  Pulse 88   Temp(Src) 98 F (36.7 C)  Resp 18  Ht 5\' 11"  (1.803 m)  Wt 206 lb (93.441 kg)  BMI 28.74 kg/m2  SpO2 92%  General- elderly male in no acute distress Head- atraumatic, normocephalic Eyes- no pallor, no icterus, no discharge Neck- no lymphadenopathy Throat- moist mucus membrane Cardiovascular- normal s1,s2, no murmurs Respiratory- bilateral clear to auscultation, no wheeze, no rhonchi, no crackles, no use of accessory muscles, on o2 Abdomen- bowel sounds present, soft, non tender Musculoskeletal- able to move all 4 extremities, no spinal and paraspinal tenderness, leg edema 1+ Neurological- no focal deficit Skin- warm and dry Psychiatry- alert and oriented to person, place and time, normal mood and affect   Discharge plan  Home health services: RN services for monitoring respiratory status and fluid status, weight monitor, CNA services  DME required: none  PCP follow up: Dr Elta Guadeloupe Perini 08/23/14 9:30 am  30 days supply of prescription medications provided  Plan of care discussed with patient and verbalizes understanding this. Reviewed care plan with nursing staff

## 2014-08-18 ENCOUNTER — Encounter (HOSPITAL_COMMUNITY): Payer: Self-pay | Admitting: Emergency Medicine

## 2014-08-18 ENCOUNTER — Inpatient Hospital Stay (HOSPITAL_COMMUNITY)
Admission: EM | Admit: 2014-08-18 | Discharge: 2014-08-23 | DRG: 291 | Disposition: A | Discharge status: Home / Self Care | Payer: Medicare Other | Attending: Internal Medicine | Admitting: Internal Medicine

## 2014-08-18 ENCOUNTER — Emergency Department (HOSPITAL_COMMUNITY): Payer: Medicare Other

## 2014-08-18 ENCOUNTER — Ambulatory Visit: Payer: Medicare Other | Admitting: Cardiology

## 2014-08-18 DIAGNOSIS — Z7982 Long term (current) use of aspirin: Secondary | ICD-10-CM

## 2014-08-18 DIAGNOSIS — I5033 Acute on chronic diastolic (congestive) heart failure: Secondary | ICD-10-CM

## 2014-08-18 DIAGNOSIS — Z794 Long term (current) use of insulin: Secondary | ICD-10-CM

## 2014-08-18 DIAGNOSIS — I739 Peripheral vascular disease, unspecified: Secondary | ICD-10-CM | POA: Diagnosis present

## 2014-08-18 DIAGNOSIS — I10 Essential (primary) hypertension: Secondary | ICD-10-CM | POA: Diagnosis present

## 2014-08-18 DIAGNOSIS — Z87891 Personal history of nicotine dependence: Secondary | ICD-10-CM | POA: Diagnosis not present

## 2014-08-18 DIAGNOSIS — Z8572 Personal history of non-Hodgkin lymphomas: Secondary | ICD-10-CM | POA: Diagnosis not present

## 2014-08-18 DIAGNOSIS — I252 Old myocardial infarction: Secondary | ICD-10-CM | POA: Diagnosis not present

## 2014-08-18 DIAGNOSIS — I2510 Atherosclerotic heart disease of native coronary artery without angina pectoris: Secondary | ICD-10-CM | POA: Diagnosis present

## 2014-08-18 DIAGNOSIS — K219 Gastro-esophageal reflux disease without esophagitis: Secondary | ICD-10-CM

## 2014-08-18 DIAGNOSIS — J9600 Acute respiratory failure, unspecified whether with hypoxia or hypercapnia: Secondary | ICD-10-CM | POA: Diagnosis present

## 2014-08-18 DIAGNOSIS — E1165 Type 2 diabetes mellitus with hyperglycemia: Secondary | ICD-10-CM

## 2014-08-18 DIAGNOSIS — R55 Syncope and collapse: Secondary | ICD-10-CM

## 2014-08-18 DIAGNOSIS — I5021 Acute systolic (congestive) heart failure: Secondary | ICD-10-CM

## 2014-08-18 DIAGNOSIS — Z6828 Body mass index (BMI) 28.0-28.9, adult: Secondary | ICD-10-CM | POA: Diagnosis not present

## 2014-08-18 DIAGNOSIS — J9611 Chronic respiratory failure with hypoxia: Secondary | ICD-10-CM

## 2014-08-18 DIAGNOSIS — J449 Chronic obstructive pulmonary disease, unspecified: Secondary | ICD-10-CM | POA: Diagnosis present

## 2014-08-18 DIAGNOSIS — IMO0001 Reserved for inherently not codable concepts without codable children: Secondary | ICD-10-CM

## 2014-08-18 DIAGNOSIS — J90 Pleural effusion, not elsewhere classified: Secondary | ICD-10-CM

## 2014-08-18 DIAGNOSIS — C3410 Malignant neoplasm of upper lobe, unspecified bronchus or lung: Secondary | ICD-10-CM

## 2014-08-18 DIAGNOSIS — E785 Hyperlipidemia, unspecified: Secondary | ICD-10-CM

## 2014-08-18 DIAGNOSIS — C3412 Malignant neoplasm of upper lobe, left bronchus or lung: Secondary | ICD-10-CM

## 2014-08-18 DIAGNOSIS — E669 Obesity, unspecified: Secondary | ICD-10-CM | POA: Diagnosis present

## 2014-08-18 DIAGNOSIS — E1149 Type 2 diabetes mellitus with other diabetic neurological complication: Secondary | ICD-10-CM

## 2014-08-18 DIAGNOSIS — I480 Paroxysmal atrial fibrillation: Secondary | ICD-10-CM | POA: Diagnosis present

## 2014-08-18 DIAGNOSIS — G629 Polyneuropathy, unspecified: Secondary | ICD-10-CM | POA: Diagnosis present

## 2014-08-18 DIAGNOSIS — J9621 Acute and chronic respiratory failure with hypoxia: Secondary | ICD-10-CM | POA: Diagnosis present

## 2014-08-18 DIAGNOSIS — F329 Major depressive disorder, single episode, unspecified: Secondary | ICD-10-CM

## 2014-08-18 DIAGNOSIS — I350 Nonrheumatic aortic (valve) stenosis: Secondary | ICD-10-CM | POA: Diagnosis present

## 2014-08-18 DIAGNOSIS — F32A Depression, unspecified: Secondary | ICD-10-CM

## 2014-08-18 DIAGNOSIS — E1140 Type 2 diabetes mellitus with diabetic neuropathy, unspecified: Secondary | ICD-10-CM | POA: Diagnosis present

## 2014-08-18 DIAGNOSIS — E11649 Type 2 diabetes mellitus with hypoglycemia without coma: Secondary | ICD-10-CM | POA: Diagnosis present

## 2014-08-18 DIAGNOSIS — N400 Enlarged prostate without lower urinary tract symptoms: Secondary | ICD-10-CM | POA: Diagnosis present

## 2014-08-18 DIAGNOSIS — Z66 Do not resuscitate: Secondary | ICD-10-CM | POA: Diagnosis present

## 2014-08-18 DIAGNOSIS — J9610 Chronic respiratory failure, unspecified whether with hypoxia or hypercapnia: Secondary | ICD-10-CM

## 2014-08-18 DIAGNOSIS — D649 Anemia, unspecified: Secondary | ICD-10-CM | POA: Diagnosis present

## 2014-08-18 DIAGNOSIS — I5043 Acute on chronic combined systolic (congestive) and diastolic (congestive) heart failure: Secondary | ICD-10-CM | POA: Diagnosis present

## 2014-08-18 DIAGNOSIS — R0602 Shortness of breath: Secondary | ICD-10-CM

## 2014-08-18 LAB — BASIC METABOLIC PANEL
ANION GAP: 19 — AB (ref 5–15)
BUN: 22 mg/dL (ref 6–23)
CO2: 23 mEq/L (ref 19–32)
CREATININE: 1.28 mg/dL (ref 0.50–1.35)
Calcium: 8.2 mg/dL — ABNORMAL LOW (ref 8.4–10.5)
Chloride: 99 mEq/L (ref 96–112)
GFR calc Af Amer: 60 mL/min — ABNORMAL LOW (ref >90)
GFR, EST NON AFRICAN AMERICAN: 52 mL/min — AB (ref >90)
Glucose, Bld: 168 mg/dL — ABNORMAL HIGH (ref 70–99)
Potassium: 3.7 mEq/L (ref 3.7–5.3)
SODIUM: 141 meq/L (ref 137–147)

## 2014-08-18 LAB — PRO B NATRIURETIC PEPTIDE: PRO B NATRI PEPTIDE: 22975 pg/mL — AB (ref 0–450)

## 2014-08-18 LAB — GLUCOSE, CAPILLARY
Glucose-Capillary: 172 mg/dL — ABNORMAL HIGH (ref 70–99)
Glucose-Capillary: 190 mg/dL — ABNORMAL HIGH (ref 70–99)

## 2014-08-18 LAB — I-STAT TROPONIN, ED: Troponin i, poc: 0.03 ng/mL (ref 0.00–0.08)

## 2014-08-18 LAB — MRSA PCR SCREENING: MRSA BY PCR: NEGATIVE

## 2014-08-18 LAB — CBC
HCT: 29.2 % — ABNORMAL LOW (ref 39.0–52.0)
Hemoglobin: 9.3 g/dL — ABNORMAL LOW (ref 13.0–17.0)
MCH: 29.8 pg (ref 26.0–34.0)
MCHC: 31.8 g/dL (ref 30.0–36.0)
MCV: 93.6 fL (ref 78.0–100.0)
PLATELETS: 154 10*3/uL (ref 150–400)
RBC: 3.12 MIL/uL — ABNORMAL LOW (ref 4.22–5.81)
RDW: 16.4 % — ABNORMAL HIGH (ref 11.5–15.5)
WBC: 5.4 10*3/uL (ref 4.0–10.5)

## 2014-08-18 MED ORDER — POTASSIUM CHLORIDE ER 10 MEQ PO TBCR
40.0000 meq | EXTENDED_RELEASE_TABLET | Freq: Every day | ORAL | Status: DC
  Administered 2014-08-18 – 2014-08-19 (×2): 40 meq via ORAL
  Filled 2014-08-18 (×2): qty 4

## 2014-08-18 MED ORDER — GABAPENTIN 300 MG PO CAPS
600.0000 mg | ORAL_CAPSULE | Freq: Two times a day (BID) | ORAL | Status: DC
  Administered 2014-08-18 – 2014-08-23 (×10): 600 mg via ORAL
  Filled 2014-08-18 (×10): qty 2

## 2014-08-18 MED ORDER — SODIUM CHLORIDE 0.9 % IJ SOLN: 3.0000 mL | INTRAMUSCULAR | Status: DC | PRN

## 2014-08-18 MED ORDER — TAMSULOSIN HCL 0.4 MG PO CAPS
0.4000 mg | ORAL_CAPSULE | Freq: Every day | ORAL | Status: DC
  Administered 2014-08-19 – 2014-08-23 (×5): 0.4 mg via ORAL
  Filled 2014-08-18 (×5): qty 1

## 2014-08-18 MED ORDER — INSULIN ASPART 100 UNIT/ML ~~LOC~~ SOLN
5.0000 [IU] | Freq: Two times a day (BID) | SUBCUTANEOUS | Status: DC
  Administered 2014-08-18 – 2014-08-19 (×2): 5 [IU] via SUBCUTANEOUS

## 2014-08-18 MED ORDER — CETYLPYRIDINIUM CHLORIDE 0.05 % MT LIQD
7.0000 mL | Freq: Two times a day (BID) | OROMUCOSAL | Status: DC
  Administered 2014-08-18 – 2014-08-23 (×9): 7 mL via OROMUCOSAL

## 2014-08-18 MED ORDER — SODIUM CHLORIDE 0.9 % IJ SOLN: 3.0000 mL | Freq: Two times a day (BID) | INTRAMUSCULAR | Status: DC

## 2014-08-18 MED ORDER — SODIUM CHLORIDE 0.9 % IV SOLN: 250.0000 mL | INTRAVENOUS | Status: DC | PRN

## 2014-08-18 MED ORDER — SUCRALFATE 1 G PO TABS
1.0000 g | ORAL_TABLET | Freq: Four times a day (QID) | ORAL | Status: DC
  Administered 2014-08-18 – 2014-08-23 (×18): 1 g via ORAL
  Filled 2014-08-18 (×20): qty 1

## 2014-08-18 MED ORDER — FUROSEMIDE 10 MG/ML IJ SOLN
40.0000 mg | Freq: Two times a day (BID) | INTRAMUSCULAR | Status: DC
  Administered 2014-08-18 – 2014-08-20 (×5): 40 mg via INTRAVENOUS
  Filled 2014-08-18 (×5): qty 4

## 2014-08-18 MED ORDER — METOPROLOL TARTRATE 25 MG PO TABS
12.5000 mg | ORAL_TABLET | Freq: Every day | ORAL | Status: DC
  Administered 2014-08-19: 12.5 mg via ORAL
  Filled 2014-08-18: qty 1

## 2014-08-18 MED ORDER — PANTOPRAZOLE SODIUM 40 MG PO TBEC
40.0000 mg | DELAYED_RELEASE_TABLET | Freq: Every evening | ORAL | Status: DC
  Administered 2014-08-18 – 2014-08-22 (×5): 40 mg via ORAL
  Filled 2014-08-18 (×6): qty 1

## 2014-08-18 MED ORDER — ASPIRIN 81 MG PO CHEW
81.0000 mg | CHEWABLE_TABLET | Freq: Two times a day (BID) | ORAL | Status: DC
  Administered 2014-08-18 – 2014-08-23 (×10): 81 mg via ORAL
  Filled 2014-08-18 (×10): qty 1

## 2014-08-18 MED ORDER — CITALOPRAM HYDROBROMIDE 20 MG PO TABS
20.0000 mg | ORAL_TABLET | Freq: Every day | ORAL | Status: DC
  Administered 2014-08-19 – 2014-08-23 (×5): 20 mg via ORAL
  Filled 2014-08-18 (×5): qty 1

## 2014-08-18 MED ORDER — LINAGLIPTIN 5 MG PO TABS
5.0000 mg | ORAL_TABLET | Freq: Every day | ORAL | Status: DC
  Administered 2014-08-19: 5 mg via ORAL
  Filled 2014-08-18: qty 1

## 2014-08-18 MED ORDER — OXYCODONE-ACETAMINOPHEN 5-325 MG PO TABS
1.0000 | ORAL_TABLET | ORAL | Status: DC | PRN
  Administered 2014-08-19 – 2014-08-22 (×5): 1 via ORAL
  Filled 2014-08-18 (×5): qty 1

## 2014-08-18 MED ORDER — SODIUM CHLORIDE 0.9 % IJ SOLN
3.0000 mL | Freq: Two times a day (BID) | INTRAMUSCULAR | Status: DC
  Administered 2014-08-19 – 2014-08-21 (×3): 3 mL via INTRAVENOUS

## 2014-08-18 MED ORDER — ATORVASTATIN CALCIUM 40 MG PO TABS
40.0000 mg | ORAL_TABLET | Freq: Every day | ORAL | Status: DC
  Administered 2014-08-19 – 2014-08-23 (×5): 40 mg via ORAL
  Filled 2014-08-18 (×5): qty 1

## 2014-08-18 MED ORDER — AMIODARONE HCL 200 MG PO TABS
200.0000 mg | ORAL_TABLET | Freq: Two times a day (BID) | ORAL | Status: DC
  Administered 2014-08-18 – 2014-08-23 (×10): 200 mg via ORAL
  Filled 2014-08-18 (×10): qty 1

## 2014-08-18 MED ORDER — FINASTERIDE 5 MG PO TABS
5.0000 mg | ORAL_TABLET | Freq: Every day | ORAL | Status: DC
  Administered 2014-08-19 – 2014-08-23 (×5): 5 mg via ORAL
  Filled 2014-08-18 (×5): qty 1

## 2014-08-18 MED ORDER — INSULIN GLARGINE 100 UNIT/ML ~~LOC~~ SOLN
22.0000 [IU] | Freq: Every day | SUBCUTANEOUS | Status: DC
  Administered 2014-08-18: 22 [IU] via SUBCUTANEOUS
  Filled 2014-08-18 (×2): qty 0.22

## 2014-08-18 MED ORDER — INSULIN ASPART 100 UNIT/ML ~~LOC~~ SOLN
0.0000 [IU] | Freq: Three times a day (TID) | SUBCUTANEOUS | Status: DC
  Administered 2014-08-19: 3 [IU] via SUBCUTANEOUS
  Administered 2014-08-20: 2 [IU] via SUBCUTANEOUS
  Administered 2014-08-21: 5 [IU] via SUBCUTANEOUS
  Administered 2014-08-21: 2 [IU] via SUBCUTANEOUS
  Administered 2014-08-22: 3 [IU] via SUBCUTANEOUS
  Administered 2014-08-22: 1 [IU] via SUBCUTANEOUS
  Administered 2014-08-23: 3 [IU] via SUBCUTANEOUS

## 2014-08-18 MED ORDER — ENOXAPARIN SODIUM 40 MG/0.4ML ~~LOC~~ SOLN
40.0000 mg | SUBCUTANEOUS | Status: DC
  Administered 2014-08-18 – 2014-08-22 (×5): 40 mg via SUBCUTANEOUS
  Filled 2014-08-18 (×5): qty 0.4

## 2014-08-18 NOTE — ED Notes (Signed)
Patient at cardiology appointment, when patient had syncopal event Patient's son denies that patient hit his head Patient on home O2  Patient placed on NRB mask due to O2 sat 83% upon arrival Patient with hx of lung CA, per son Per son, patient finished with both chemo and radiation tx Patient with multiple medical issues

## 2014-08-18 NOTE — ED Notes (Signed)
Bed: RD40 Expected date:  Expected time:  Means of arrival:  Comments: Triage- shingles

## 2014-08-18 NOTE — Progress Notes (Signed)
  CARE MANAGEMENT ED NOTE 08/18/2014  Patient:  [name] [name], [name] [name]   Account Number:  [id number]  Date Initiated:  08/18/2014  Documentation initiated by:  Livia Snellen  Subjective/Objective Assessment:   patient presents to Ed post syncopal episode     Subjective/Objective Assessment Detail:   Patient ith pmhx of DM, CAD, CHF, COPD, lung cancer, PAF. Patient admitted and discharged form [place] from 9/26- 10/06.     Action/Plan:   Action/Plan Detail:   Anticipated DC Date:       Status Recommendation to Physician:   Result of Recommendation:    Other ED [place]  Other  PCP issues    Choice offered to / List presented to:            Status of service:  Completed, signed off  ED Comments:   ED Comments Detail:  EDCM spoke to patient and his son at bedside.  Patient's son's name is Nicole Kindred.  Patient's son reports patient's pcp is Dr. Joylene Draft at [facility].   Patient's oncologist is Dr. Betsy Coder.  System updated. Patient's son reports sfter patient was discharged from [facility], patient went to [facility] for Rehab.  Upon chart review patient was seen by Dr. Bubba Camp at [facility].  Patient was discharged home from [facility] on 10/16.  Patient lives by himself.  Patient's son lived three blocks away from patient.  Patient confirms he is receiving home health services with [facility] for an RN.  Patient wears oxygen at home at 4 liters supplied by [facility].  Patient has a Corporate investment banker at home.  Patient reports he has been able to complete his ADL's on his own.  Patient's family brings him his meals. Patient confirms since his discharge from [facility] he has not had an appointment with any doctor except being seen by Dr. Bubba Camp at [facility].  This information was placed in readmission focus note.  No further EDCM needs at this time.

## 2014-08-18 NOTE — H&P (Signed)
[facility] History and Physical  [name] [name] HWE:[id number] DOB: June 10, 1933 DOA: 08/18/2014  Referring physician:  PCP: [name], MD  Specialists:   Chief Complaint: Shortness of breath and passing out  HPI: [name] [name] is a 80 y.o. male  With a history of combined systolic and diastolic heart failure, diabetes mellitus, hypertension, lung cancer, that presented to the emergency department with complaints of passing out as well as shortness of breath. Patient passed out earlier today while on his way to the doctor's appointment. His son then brought into the emergency department. Patient also complained of shortness of breath upon arrival to the emergency department however since then and this has resolved. Patient denied any dizziness prior to his syncopal episode. Patient currently denies any chest pain, abdominal pain, nausea, vomiting, ill contacts or recent travel.  Review of Systems:  Constitutional: Denies fever, chills, diaphoresis, appetite change and fatigue.  HEENT: Denies photophobia, eye pain, redness, hearing loss, ear pain, congestion, sore throat, rhinorrhea, sneezing, mouth sores, trouble swallowing, neck pain, neck stiffness and tinnitus.   Respiratory: Complains of shortness of breath.  Cardiovascular: Denies chest pain, palpitations and leg swelling.  Gastrointestinal: Denies nausea, vomiting, abdominal pain, diarrhea, constipation, blood in stool and abdominal distention.  Genitourinary: Denies dysuria, urgency, frequency, hematuria, flank pain and difficulty urinating.  Musculoskeletal: Denies myalgias, back pain, joint swelling, arthralgias and gait problem.  Skin: Denies pallor, rash and wound.  Neurological: Complains of passing out today. Hematological: Denies adenopathy. Easy bruising, personal or family bleeding history  Psychiatric/Behavioral: Denies suicidal ideation, mood changes, confusion, nervousness, sleep disturbance and agitation  Past  Medical History  Diagnosis Date  . Peripheral arterial disease     s/p Ao Bifem Bypass  . Diabetes mellitus without complication   . CAD (coronary artery disease)     By  CT Scan  . Hyperlipidemia   . Hypertension   . Non Hodgkin's lymphoma 2000, 2010  . Peripheral neuropathy   . COPD (chronic obstructive pulmonary disease)   . History of lower GI bleeding   . Substance abuse   . Lung cancer, upper lobe     left lung  . AAA (abdominal aortic aneurysm)   . MI (myocardial infarction) 04/10/14   Past Surgical History  Procedure Laterality Date  . Fracture surgery  2009    Left ankle  . Pr vein bypass graft,aorto-fem-pop  2005    Right common femoral to BK popliteal BPG  . Pr vein bypass graft,aorto-fem-pop  01-27-05    Revision of Right fem-pop BPG  . Cardiac catheterization  1990's  . Video bronchoscopy Bilateral 03/14/2014    Procedure: VIDEO BRONCHOSCOPY WITH FLUORO;  Surgeon: Rigoberto Noel, MD;  Location: [place];  Service: Cardiopulmonary;  Laterality: Bilateral;  . Nm myoview ltd  11/2013    Lexiscan.  EF 60%, small fixed inferoapical defect - ? artifact  . Transthoracic echocardiogram  11/2013    EF 60-65%, Gr 1 DD, Mod AS, Sever MAC - no MS.      Social History:  reports that he quit smoking about 10 years ago. His smoking use included Cigarettes. He has a 50 pack-year smoking history. He has never used smokeless tobacco. He reports that he does not drink alcohol or use illicit drugs.   No Known Allergies  Family History  Problem Relation Age of Onset  . Cancer Mother     Colon  . Heart attack Mother   . Stroke Father   . Hyperlipidemia Father   .  Hyperlipidemia Sister   . Hyperlipidemia Brother   . Hyperlipidemia Daughter   . Hypertension Son    Prior to Admission medications   Medication Sig Start Date End Date Taking? Authorizing Provider  amiodarone (PACERONE) 200 MG tablet Take 200 mg by mouth 2 (two) times daily.   Yes Historical Provider, MD  aspirin  81 MG tablet Take 81 mg by mouth 2 (two) times daily.    Yes Historical Provider, MD  atorvastatin (LIPITOR) 40 MG tablet Take 40 mg by mouth daily.   Yes Historical Provider, MD  citalopram (CELEXA) 20 MG tablet Take 20 mg by mouth daily.   Yes Historical Provider, MD  finasteride (PROSCAR) 5 MG tablet Take 5 mg by mouth daily.   Yes Historical Provider, MD  furosemide (LASIX) 40 MG tablet Take 40 mg by mouth daily.   Yes Historical Provider, MD  gabapentin (NEURONTIN) 600 MG tablet Take 600 mg by mouth 2 (two) times daily.    Yes Historical Provider, MD  Glucosamine-Chondroit-Vit C-Mn (GLUCOSAMINE CHONDR 1500 COMPLX) CAPS Take 1 capsule by mouth daily.   Yes Historical Provider, MD  insulin aspart (NOVOLOG) 100 UNIT/ML injection Inject 5 Units into the skin 2 (two) times daily.   Yes Historical Provider, MD  insulin glargine (LANTUS) 100 UNIT/ML injection Inject 0.22 mLs (22 Units total) into the skin at bedtime. 04/16/14  Yes Costin Karlyne Greenspan, MD  linagliptin (TRADJENTA) 5 MG TABS tablet Take 1 tablet (5 mg total) by mouth daily. With largest meal 07/12/14  Yes Tiffany L Reed, DO  metoprolol tartrate (LOPRESSOR) 25 MG tablet Take 12.5 mg by mouth daily. 06/02/14  Yes Mihai Croitoru, MD  oxyCODONE-acetaminophen (PERCOCET/ROXICET) 5-325 MG per tablet Take one tablet by mouth every 4 hours as needed for pain 06/30/14  Yes Tiffany L Reed, DO  pantoprazole (PROTONIX) 40 MG tablet Take 40 mg by mouth every evening.   Yes Historical Provider, MD  potassium chloride 20 MEQ TBCR Take 40 mEq by mouth daily. 07/12/14  Yes Tiffany L Reed, DO  sucralfate (CARAFATE) 1 G tablet Take 1 tablet (1 g total) by mouth 4 (four) times daily. 05/14/14  Yes Kalman Drape, MD  Tamsulosin HCl (FLOMAX) 0.4 MG CAPS Take 0.4 mg by mouth daily.   Yes Historical Provider, MD   Physical Exam: Filed Vitals:   08/18/14 1449  BP: 101/66  Pulse: 70  Resp: 22     General: Well developed, well nourished, NAD, appears stated  age  HEENT: NCAT, PERRLA, EOMI, Anicteic Sclera, mucous membranes moist.   Cardiovascular: S1 S2 auscultated, no rubs, murmurs or gallops. Regular rate and rhythm.  Respiratory: Clear to auscultation bilaterally with equal chest rise  Abdomen: Soft, nontender, nondistended, + bowel sounds  Extremities: warm dry without cyanosis clubbing or edema  Neuro: AAOx3, cranial nerves grossly intact. Strength 5/5 in patient's upper and lower extremities bilaterally  Skin: Without rashes exudates or nodules  Psych: Normal affect and demeanor with intact judgement and insight  Labs on Admission:  Basic Metabolic Panel:  Recent Labs Lab 08/18/14 1419  NA 141  K 3.7  CL 99  CO2 23  GLUCOSE 168*  BUN 22  CREATININE 1.28  CALCIUM 8.2*   Liver Function Tests: No results found for this basename: AST, ALT, ALKPHOS, BILITOT, PROT, ALBUMIN,  in the last 168 hours No results found for this basename: LIPASE, AMYLASE,  in the last 168 hours No results found for this basename: AMMONIA,  in the last 168 hours  CBC:  Recent Labs Lab 08/18/14 1419  WBC 5.4  HGB 9.3*  HCT 29.2*  MCV 93.6  PLT 154   Cardiac Enzymes: No results found for this basename: CKTOTAL, CKMB, CKMBINDEX, TROPONINI,  in the last 168 hours  BNP (last 3 results)  Recent Labs  05/13/14 2240 07/23/14 0955 08/18/14 1512  PROBNP 1118.0* 21437.0* 22975.0*   CBG: No results found for this basename: GLUCAP,  in the last 168 hours  Radiological Exams on Admission: Dg Chest 2 View  08/18/2014   CLINICAL DATA:  Shortness of breath, left upper lobe lung cancer  EXAM: CHEST  2 VIEW  COMPARISON:  08/02/2014  FINDINGS: There is a moderate left pleural effusion. There is left upper lobe post treatment changes. There are increased interstitial markings in the right lung left upper lobe. Stable cardiomediastinal silhouette. Right-sided Port-A-Cath with the tip projecting over the SVC. Unremarkable osseous structures.   IMPRESSION: 1. Moderate left pleural effusion. 2. Bilateral interstitial prominence likely reflecting an element of chronic interstitial lung disease with superimposed mild interstitial edema.   Electronically Signed   By: Kathreen Devoid   On: 08/18/2014 15:27    EKG: Independently reviewed. Sinus, rate 85, left bundle, no acute changes on prior EKG  Assessment/Plan  Acute respiratory failure -Patient admitted to telemetry unit. -Likely multifactorial including possible CHF exacerbation versus history of lung cancer versus a pleural effusion -Patient was hypoxic to 82% upon admission. -Patient currently at his baseline, requiring 4 L of oxygen via nasal cannula  Acute combined systolic and diastolic heart failure -Echocardiogram on 07/27/2014 shows an EF of 25-30% with a grade 2 diastolic dysfunction -BNP was 23,000 -Will place patient on Lasix 40 mg IV twice a day -Will place him on fluid restriction, monitor his intake and output, and daily weights  Left pleural effusion -Chest x-ray showed a moderate left pleural effusion -Will place patient on Lasix and continue to monitor  -Will repeat chest x-ray -If effusion does not improve, will consult IR for thoracentesis  Syncope -Patient passed out earlier today, which was witnessed -Patient had echocardiogram, results as above -Will obtain carotid Doppler -Likely vasovagal versus due to hypoxia -Will obtain orthostatic vitals upon admission -Will conduct neuro checks every 4 hours  Normocytic anemia -Patient currently at his baseline hemoglobin, continue to monitor CBC  Hypertension -Continue metoprolol, Lasix  Diabetes mellitus, type II with neuropathy -Continue home regimen along with insulin sliding scale and CBG monitoring -Continue gabapentin for neuropathy  Hyperlipidemia -Continue statin  History of paroxysmal atrial fibrillation -Continue amiodarone and metoprolol, patient is currently rate and rhythm  control  BPH -Continue Flomax  Lung cancer, left upper -Patient finished chemotherapy in August 2015 and has also received radiation in the past -Patient with followup with his oncologist upon discharge  DVT prophylaxis:  Lovenox  Code Status:  DO NOT RESUSCITATE  Condition:  Guarded  Family Communication:  Family at bedside. Admission, patients condition and plan of care including tests being ordered have been discussed with the patient and  family who indicate understanding and agree with the plan and Code Status.  Disposition Plan:  Admitted   Time spent:  60 minutes  Khristi Schiller D.O. [facility] Pager [phone]  If 7PM-7AM, please contact night-coverage [email or web address] Password [facility] 08/18/2014, 6:26 PM

## 2014-08-18 NOTE — ED Provider Notes (Signed)
CSN: [id number]     Arrival date & time 08/18/14  1407 History   First MD Initiated Contact with Patient 08/18/14 1503     Chief Complaint  Patient presents with  . Loss of Consciousness  . Shortness of Breath     (Consider location/radiation/quality/duration/timing/severity/associated sxs/prior Treatment) HPI Comments: 80 year old male with multiple medical conditions including diabetes, lipids, neuropathy, CAD, aneurysm, aortic stenosis, vascular disease, lung cancer with recent chemotherapy and radiation, atrial fibrillation presents after syncope episode. Patient was on his way to the cardiology office when his legs felt weak and he got lightheaded and passed out, patient is uses a walker and people or nearby in no head injury. Patient feels general fatigue and shortness of breath similar to previous. Patient denies any chest pain or abdominal pain. Patient denies any recent blood in his stools or changes in medication.   Patient is a 80 y.o. male presenting with syncope and shortness of breath. The history is provided by the patient.  Loss of Consciousness Associated symptoms: shortness of breath   Associated symptoms: no chest pain, no fever, no headaches and no vomiting   Shortness of Breath Associated symptoms: syncope   Associated symptoms: no abdominal pain, no chest pain, no fever, no headaches, no neck pain, no rash and no vomiting     Past Medical History  Diagnosis Date  . Peripheral arterial disease     s/p Ao Bifem Bypass  . Diabetes mellitus without complication   . CAD (coronary artery disease)     By  CT Scan  . Hyperlipidemia   . Hypertension   . Non Hodgkin's lymphoma 2000, 2010  . Peripheral neuropathy   . COPD (chronic obstructive pulmonary disease)   . History of lower GI bleeding   . Substance abuse   . Lung cancer, upper lobe     left lung  . AAA (abdominal aortic aneurysm)   . MI (myocardial infarction) 04/10/14   Past Surgical History  Procedure  Laterality Date  . Fracture surgery  2009    Left ankle  . Pr vein bypass graft,aorto-fem-pop  2005    Right common femoral to BK popliteal BPG  . Pr vein bypass graft,aorto-fem-pop  01-27-05    Revision of Right fem-pop BPG  . Cardiac catheterization  1990's  . Video bronchoscopy Bilateral 03/14/2014    Procedure: VIDEO BRONCHOSCOPY WITH FLUORO;  Surgeon: Rigoberto Noel, MD;  Location: [place];  Service: Cardiopulmonary;  Laterality: Bilateral;  . Nm myoview ltd  11/2013    Lexiscan.  EF 60%, small fixed inferoapical defect - ? artifact  . Transthoracic echocardiogram  11/2013    EF 60-65%, Gr 1 DD, Mod AS, Sever MAC - no MS.      Family History  Problem Relation Age of Onset  . Cancer Mother     Colon  . Heart attack Mother   . Stroke Father   . Hyperlipidemia Father   . Hyperlipidemia Sister   . Hyperlipidemia Brother   . Hyperlipidemia Daughter   . Hypertension Son    History  Substance Use Topics  . Smoking status: Former Smoker -- 1.00 packs/day for 50 years    Types: Cigarettes    Quit date: 10/29/2003  . Smokeless tobacco: Never Used  . Alcohol Use: No    Review of Systems  Constitutional: Positive for fatigue. Negative for fever and chills.  HENT: Negative for congestion.   Eyes: Negative for visual disturbance.  Respiratory: Positive for shortness of breath.  Cardiovascular: Positive for syncope. Negative for chest pain.  Gastrointestinal: Negative for vomiting and abdominal pain.  Genitourinary: Negative for dysuria and flank pain.  Musculoskeletal: Negative for back pain, neck pain and neck stiffness.  Skin: Negative for rash.  Neurological: Positive for syncope and light-headedness. Negative for headaches.      Allergies  Review of patient's allergies indicates no known allergies.  Home Medications   Prior to Admission medications   Medication Sig Start Date End Date Taking? Authorizing Provider  amiodarone (PACERONE) 200 MG tablet Take 200 mg by  mouth 2 (two) times daily.   Yes Historical Provider, MD  aspirin 81 MG tablet Take 81 mg by mouth 2 (two) times daily.    Yes Historical Provider, MD  atorvastatin (LIPITOR) 40 MG tablet Take 40 mg by mouth daily.   Yes Historical Provider, MD  citalopram (CELEXA) 20 MG tablet Take 20 mg by mouth daily.   Yes Historical Provider, MD  finasteride (PROSCAR) 5 MG tablet Take 5 mg by mouth daily.   Yes Historical Provider, MD  furosemide (LASIX) 40 MG tablet Take 40 mg by mouth daily.   Yes Historical Provider, MD  gabapentin (NEURONTIN) 600 MG tablet Take 600 mg by mouth 2 (two) times daily.    Yes Historical Provider, MD  Glucosamine-Chondroit-Vit C-Mn (GLUCOSAMINE CHONDR 1500 COMPLX) CAPS Take 1 capsule by mouth daily.   Yes Historical Provider, MD  insulin aspart (NOVOLOG) 100 UNIT/ML injection Inject 5 Units into the skin 2 (two) times daily.   Yes Historical Provider, MD  insulin glargine (LANTUS) 100 UNIT/ML injection Inject 0.22 mLs (22 Units total) into the skin at bedtime. 04/16/14  Yes Costin Karlyne Greenspan, MD  linagliptin (TRADJENTA) 5 MG TABS tablet Take 1 tablet (5 mg total) by mouth daily. With largest meal 07/12/14  Yes Tiffany L Reed, DO  metoprolol tartrate (LOPRESSOR) 25 MG tablet Take 12.5 mg by mouth daily. 06/02/14  Yes Mihai Croitoru, MD  oxyCODONE-acetaminophen (PERCOCET/ROXICET) 5-325 MG per tablet Take one tablet by mouth every 4 hours as needed for pain 06/30/14  Yes Tiffany L Reed, DO  pantoprazole (PROTONIX) 40 MG tablet Take 40 mg by mouth every evening.   Yes Historical Provider, MD  potassium chloride 20 MEQ TBCR Take 40 mEq by mouth daily. 07/12/14  Yes Tiffany L Reed, DO  sucralfate (CARAFATE) 1 G tablet Take 1 tablet (1 g total) by mouth 4 (four) times daily. 05/14/14  Yes Kalman Drape, MD  Tamsulosin HCl (FLOMAX) 0.4 MG CAPS Take 0.4 mg by mouth daily.   Yes Historical Provider, MD   BP 101/66  Pulse 70  Resp 22  SpO2 95% Physical Exam  Nursing note and vitals  reviewed. Constitutional: He is oriented to person, place, and time. He appears well-developed. No distress.  HENT:  Head: Normocephalic and atraumatic.  Eyes: Conjunctivae are normal. Right eye exhibits no discharge. Left eye exhibits no discharge.  Neck: Normal range of motion. Neck supple. No tracheal deviation present.  Cardiovascular: Normal rate and regular rhythm.   Murmur (3+ systolic murmur upper sternal border) heard. Pulmonary/Chest: No respiratory distress. Rales: and mild tachypnea, few crackles at bases bilateral.  Abdominal: Soft. He exhibits no distension. There is no tenderness. There is no guarding.  Musculoskeletal: He exhibits edema (1+ bilateral lower extremities).  Neurological: He is alert and oriented to person, place, and time. No cranial nerve deficit. GCS eye subscore is 4. GCS verbal subscore is 5. GCS motor subscore is 6.  Patient  has equal strength bilateral upper and lower extremity with mild general weakness.  Skin: Skin is warm. No rash noted. There is pallor.  Psychiatric: He has a normal mood and affect.    ED Course  Procedures (including critical care time) Labs Review Labs Reviewed  CBC - Abnormal; Notable for the following:    RBC 3.12 (*)    Hemoglobin 9.3 (*)    HCT 29.2 (*)    RDW 16.4 (*)    All other components within normal limits  BASIC METABOLIC PANEL - Abnormal; Notable for the following:    Glucose, Bld 168 (*)    Calcium 8.2 (*)    GFR calc non Af Amer 52 (*)    GFR calc Af Amer 60 (*)    Anion gap 19 (*)    All other components within normal limits  PRO B NATRIURETIC PEPTIDE - Abnormal; Notable for the following:    Pro B Natriuretic peptide (BNP) 16109.6 (*)    All other components within normal limits  CBG MONITORING, ED  I-STAT CHEM 8, ED  I-STAT TROPOININ, ED    Imaging Review Dg Chest 2 View  08/18/2014   CLINICAL DATA:  Shortness of breath, left upper lobe lung cancer  EXAM: CHEST  2 VIEW  COMPARISON:  08/02/2014   FINDINGS: There is a moderate left pleural effusion. There is left upper lobe post treatment changes. There are increased interstitial markings in the right lung left upper lobe. Stable cardiomediastinal silhouette. Right-sided Port-A-Cath with the tip projecting over the SVC. Unremarkable osseous structures.  IMPRESSION: 1. Moderate left pleural effusion. 2. Bilateral interstitial prominence likely reflecting an element of chronic interstitial lung disease with superimposed mild interstitial edema.   Electronically Signed   By: Kathreen Devoid   On: 08/18/2014 15:27     EKG Interpretation   Date/Time:  Thursday August 18 2014 14:19:15 EDT Ventricular Rate:  75 PR Interval:  191 QRS Duration: 138 QT Interval:  482 QTC Calculation: 538 R Axis:   -55 Text Interpretation:  Sinus rhythm Probable left atrial enlargement Left  bundle branch block overall similar to Sept 2015 Confirmed by Farah Benish  MD,  Kamylah Manzo ([id number]) on 08/18/2014 6:09:47 PM      MDM   Final diagnoses:  Acute systolic congestive heart failure  Syncope, unspecified syncope type  Anemia, unspecified anemia type   Patient with complicated high-risk medical history presents with syncope. Patient denies any chest pain or abdominal pain. Patient does have an aneurysm history however does not have any abdominal or back pain at this time. Multiple possible causes of syncope, patient did feel lightheaded and generally weak before the onset. Chest x-ray showed moderate left pleural effusion, anemia similar to previous, BNP elevated to previous. Discussed admission for further evaluation with [facility] physician who accepted to telemetry. Pt on 4 L [place], at home 3-4 L.   The patients results and plan were reviewed and discussed.   Any x-rays performed were personally reviewed by myself.   Differential diagnosis were considered with the presenting HPI.  Medications - No data to display  Filed Vitals:   08/18/14 1415 08/18/14 1420 08/18/14  1449  BP: 96/61  101/66  Pulse: 80 75 70  Resp:  15 22  SpO2: 82% 95% 95%    Final diagnoses:  Acute systolic congestive heart failure  Syncope, unspecified syncope type  Anemia, unspecified anemia type  Hypoxia  Admission/ observation were discussed with the admitting physician, patient and/or family and they are  comfortable with the plan.    Mariea Clonts, MD 08/18/14 [phone]

## 2014-08-18 NOTE — ED Notes (Signed)
Family at bedside. son

## 2014-08-18 NOTE — ED Notes (Signed)
Patient now 95% on 5L [place]

## 2014-08-18 NOTE — ED Notes (Signed)
Iv team paged to access port

## 2014-08-19 DIAGNOSIS — N400 Enlarged prostate without lower urinary tract symptoms: Secondary | ICD-10-CM

## 2014-08-19 DIAGNOSIS — E785 Hyperlipidemia, unspecified: Secondary | ICD-10-CM

## 2014-08-19 DIAGNOSIS — E1149 Type 2 diabetes mellitus with other diabetic neurological complication: Secondary | ICD-10-CM

## 2014-08-19 DIAGNOSIS — I480 Paroxysmal atrial fibrillation: Secondary | ICD-10-CM

## 2014-08-19 DIAGNOSIS — R55 Syncope and collapse: Secondary | ICD-10-CM

## 2014-08-19 LAB — BASIC METABOLIC PANEL
Anion gap: 13 (ref 5–15)
BUN: 21 mg/dL (ref 6–23)
CO2: 29 mEq/L (ref 19–32)
Calcium: 8.3 mg/dL — ABNORMAL LOW (ref 8.4–10.5)
Chloride: 100 mEq/L (ref 96–112)
Creatinine, Ser: 1.12 mg/dL (ref 0.50–1.35)
GFR calc Af Amer: 70 mL/min — ABNORMAL LOW (ref >90)
GFR calc non Af Amer: 61 mL/min — ABNORMAL LOW (ref >90)
Glucose, Bld: 43 mg/dL — CL (ref 70–99)
POTASSIUM: 3.6 meq/L — AB (ref 3.7–5.3)
Sodium: 142 mEq/L (ref 137–147)

## 2014-08-19 LAB — GLUCOSE, CAPILLARY
GLUCOSE-CAPILLARY: 80 mg/dL (ref 70–99)
GLUCOSE-CAPILLARY: 198 mg/dL — AB (ref 70–99)
Glucose-Capillary: 78 mg/dL (ref 70–99)
Glucose-Capillary: 206 mg/dL — ABNORMAL HIGH (ref 70–99)

## 2014-08-19 LAB — CBC
HCT: 28.6 % — ABNORMAL LOW (ref 39.0–52.0)
HEMOGLOBIN: 9.1 g/dL — AB (ref 13.0–17.0)
MCH: 29.6 pg (ref 26.0–34.0)
MCHC: 31.8 g/dL (ref 30.0–36.0)
MCV: 93.2 fL (ref 78.0–100.0)
Platelets: 149 10*3/uL — ABNORMAL LOW (ref 150–400)
RBC: 3.07 MIL/uL — ABNORMAL LOW (ref 4.22–5.81)
RDW: 16.3 % — ABNORMAL HIGH (ref 11.5–15.5)
WBC: 5.6 10*3/uL (ref 4.0–10.5)

## 2014-08-19 LAB — HEMOGLOBIN A1C
Hgb A1c MFr Bld: 6.5 % — ABNORMAL HIGH (ref <5.7)
Mean Plasma Glucose: 140 mg/dL — ABNORMAL HIGH (ref <117)

## 2014-08-19 LAB — TSH: TSH: 2.16 u[IU]/mL (ref 0.350–4.500)

## 2014-08-19 MED ORDER — METOPROLOL SUCCINATE ER 25 MG PO TB24
12.5000 mg | ORAL_TABLET | Freq: Every day | ORAL | Status: DC
  Administered 2014-08-20 – 2014-08-23 (×3): 12.5 mg via ORAL
  Filled 2014-08-19 (×3): qty 1

## 2014-08-19 MED ORDER — POTASSIUM CHLORIDE ER 10 MEQ PO TBCR
40.0000 meq | EXTENDED_RELEASE_TABLET | Freq: Two times a day (BID) | ORAL | Status: DC
  Administered 2014-08-19 – 2014-08-20 (×2): 40 meq via ORAL
  Filled 2014-08-19 (×4): qty 4

## 2014-08-19 MED ORDER — INSULIN GLARGINE 100 UNIT/ML ~~LOC~~ SOLN
15.0000 [IU] | Freq: Every day | SUBCUTANEOUS | Status: DC
  Administered 2014-08-19 – 2014-08-22 (×4): 15 [IU] via SUBCUTANEOUS
  Filled 2014-08-19 (×5): qty 0.15

## 2014-08-19 MED ORDER — SODIUM CHLORIDE 0.9 % IJ SOLN
10.0000 mL | INTRAMUSCULAR | Status: DC | PRN
  Administered 2014-08-19: 30 mL
  Administered 2014-08-20: 20 mL
  Administered 2014-08-21 – 2014-08-23 (×3): 10 mL

## 2014-08-19 MED ORDER — METOLAZONE 2.5 MG PO TABS
2.5000 mg | ORAL_TABLET | Freq: Every day | ORAL | Status: DC
  Administered 2014-08-19 – 2014-08-20 (×2): 2.5 mg via ORAL
  Filled 2014-08-19 (×3): qty 1

## 2014-08-19 MED ORDER — GLUCERNA SHAKE PO LIQD
237.0000 mL | ORAL | Status: DC
  Administered 2014-08-19 – 2014-08-22 (×4): 237 mL via ORAL
  Filled 2014-08-19 (×5): qty 237

## 2014-08-19 MED ORDER — INSULIN ASPART 100 UNIT/ML ~~LOC~~ SOLN
5.0000 [IU] | Freq: Two times a day (BID) | SUBCUTANEOUS | Status: DC
  Administered 2014-08-19 – 2014-08-22 (×5): 5 [IU] via SUBCUTANEOUS

## 2014-08-19 NOTE — Progress Notes (Signed)
CRITICAL VALUE ALERT  Critical value received:  Glucose 43  Date of notification:  08/19/2014  Time of notification:  0630  Critical value read back:Yes.    Nurse who received alert:  Daleen Bo RN  MD notified (1st page):  Not applicable.  Hypoglycemic protocol initiated  Time of first page:  Not applicable  MD notified (2nd page):  Time of second page:  Responding MD:  Not applicable.  Time MD responded:  Not applicable.

## 2014-08-19 NOTE — Progress Notes (Signed)
Pt put on Orange Contact Isolation per Infection Control r/t MRSA treatment just 3 weeks ago.

## 2014-08-19 NOTE — Progress Notes (Signed)
VASCULAR LAB PRELIMINARY  PRELIMINARY  PRELIMINARY  PRELIMINARY  Carotid duplex completed.    Preliminary report:  Bilateral:  1-39% ICA stenosis.  Vertebral artery flow is antegrade.     Raphel Stickles, RVS 08/19/2014, 8:35 AM

## 2014-08-19 NOTE — Progress Notes (Signed)
Nutrition Brief Note  Patient identified on the Malnutrition Screening Tool (MST) Report  Wt Readings from Last 15 Encounters:  08/19/14 201 lb 4.8 oz (91.309 kg)  08/12/14 203 lb (92.08 kg)  08/05/14 202 lb (91.627 kg)  08/02/14 193 lb 12.6 oz (87.9 kg)  07/14/14 217 lb (98.431 kg)  07/12/14 220 lb 3.2 oz (99.882 kg)  07/12/14 213 lb (96.616 kg)  07/06/14 217 lb (98.431 kg)  07/05/14 210 lb (95.255 kg)  07/01/14 215 lb (97.523 kg)  06/21/14 206 lb 14.4 oz (93.849 kg)  06/02/14 202 lb 3.2 oz (91.717 kg)  05/30/14 198 lb 14.4 oz (90.22 kg)  05/24/14 204 lb 9.6 oz (92.806 kg)  05/23/14 198 lb 14.4 oz (90.22 kg)    Body mass index is 28.09 kg/(m^2). Patient meets criteria for Overweight based on current BMI.   Current diet order is HH/Carb Mod, patient is consuming approximately 90% of meals at this time. Labs and medications reviewed.   Pt denied any changes in appetite or weight pta. Diet recall indicates pt consumes three meals daily, and consumes variety of foods (vegetables, potatoes, eggs, biscuits). Snacks occasionally at nighttime. D/t COPD and increased nutrient needs, will order Glucerna shake once daily at 8PM. Pt voiced no additional nutrition related questions or concerns  No nutrition interventions warranted at this time. If nutrition issues arise, please consult RD.   [name] Abide MS RD LDN Clinical Dietitian [phone]

## 2014-08-19 NOTE — Evaluation (Signed)
Occupational Therapy Evaluation Patient Details Name: [name] [name] MRN: [id number] DOB: [birth year] Today's Date: 08/19/2014    History of Present Illness 80 yo male admitted with syncope with collapse, hypoxia. Hx of DM, NHL, lung cancer, AAA, aortic stenosis, MRSA, A fib, HTN, COPD. Recent d/c from hospital on 10/6 and SNF on 10/16 per chart.    Clinical Impression   Pt was admitted for the above.  Will follow in acute with mod I level goals.  Pt lives alone but has family support.  Pt does not have a 3:1 commode and will benefit from this for over commode and in shower as chair  (family will have to move it for him)    Follow Up Recommendations  Home health OT    Equipment Recommendations  3 in 1 bedside comode    Recommendations for Other Services       Precautions / Restrictions Precautions Precautions: Fall Precaution Comments: monitor 02 sats Restrictions Weight Bearing Restrictions: No      Mobility Bed Mobility Overal bed mobility: Modified Independent               Transfers Overall transfer level: Needs assistance Equipment used  RW Transfers: Sit to/from Stand Sit to Stand: Supervision         General transfer comment: for safety    Balance                                            ADL       Grooming: Set up;Sitting       Lower Body Bathing: Set up;Sit to/from stand       Lower Body Dressing: Set up;Sit to/from stand                 General ADL Comments: Pt is able to perform ADLs with set up: cued to take rest breaks when exerting.  He has LE edema but can cross legs for adls.  Did not need to use bathroom--sidestepped up [place] at end of session.  Pt has 3 family members nearby; all work but different shifts.  Educated on Hotel manager      Pertinent Vitals/Pain Pain Assessment: No/denies pain     Hand Dominance Right    Extremity/Trunk Assessment Upper Extremity Assessment Upper Extremity Assessment: Overall WFL for tasks assessed      Cervical / Trunk Assessment Cervical / Trunk Assessment: Normal   Communication Communication Communication: No difficulties   Cognition Arousal/Alertness: Awake/alert Behavior During Therapy: WFL for tasks assessed/performed Overall Cognitive Status: Within Functional Limits for tasks assessed                     General Comments       Exercises       Shoulder Instructions      Home Living Family/patient expects to be discharged to:: Private residence Living Arrangements: Alone   Type of Home: Apartment Home Access: Stairs to enter [employer] of Steps: 1   Home Layout: One level     Bathroom Shower/Tub: Occupational psychologist: Standard     Home Equipment: Cane - single point   Additional Comments: does not have  bathroom DME      Prior Functioning/Environment Level of Independence: Independent             OT Diagnosis: Generalized weakness   OT Problem List: Decreased strength;Impaired balance (sitting and/or standing);Increased edema;Cardiopulmonary status limiting activity;Decreased knowledge of use of DME or AE   OT Treatment/Interventions: Self-care/ADL training;Energy conservation;DME and/or AE instruction;Patient/family education;Balance training    OT Goals(Current goals can be found in the care plan section) Acute Rehab OT Goals Patient Stated Goal: return to PLOF OT Goal Formulation: With patient Time For Goal Achievement: 09/02/14 Potential to Achieve Goals: Good ADL Goals Pt Will Transfer to Toilet: with modified independence;bedside commode;ambulating Pt Will Perform Tub/Shower Transfer: Shower transfer;with modified independence;ambulating;3 in 1 Additional ADL Goal #1: pt will gather clothes at mod I level and complete ADL, initiating rest breaks as needed  OT Frequency: Min 2X/week    Barriers to D/C:            Co-evaluation              End of Session    Activity Tolerance: Patient tolerated treatment well Patient left: in bed;with call bell/phone within reach   Time: 7106-2694 OT Time Calculation (min): 25 min Charges:  OT General Charges $OT Visit: 1 Procedure OT Evaluation $Initial OT Evaluation Tier I: 1 Procedure OT Treatments $Therapeutic Activity: 8-22 mins G-Codes:    Mayur Duman September 01, 2014, 4:22 PM   Lesle Chris, OTR/L [phone] 09/01/14

## 2014-08-19 NOTE — Progress Notes (Signed)
[facility] PROGRESS NOTE Interim History: 80 y.o. male With a history of combined systolic and diastolic heart failure, diabetes mellitus, hypertension, lung cancer, that presented to the emergency department with complaints of passing out as well as increased shortness of breath. Patient passed out earlier today while on his way to the doctor's appointment. His son then brought him into the emergency department. Patient denied any dizziness prior to his syncopal episode. Patient currently denies any chest pain, abdominal pain, nausea, vomiting, ill contacts or recent travel. BNP elevated at 23K and CXR with vascular congestion/interstitial edema.   Filed Weights   08/18/14 1859 08/19/14 0614  Weight: 89.9 kg (198 lb 3.1 oz) 91.309 kg (201 lb 4.8 oz)        Intake/Output Summary (Last 24 hours) at 08/19/14 1711 Last data filed at 08/19/14 1300  Gross per 24 hour  Intake    840 ml  Output    650 ml  Net    190 ml     Assessment/Plan:  Acute on chronic respiratory failure  -Likely multifactorial including possible CHF exacerbation versus history of lung cancer and malignant pleural effusion  -Patient was hypoxic to 82% upon admission.  -Patient currently at his baseline, requiring 4 L of oxygen via nasal cannula  -will continue IV lasix and metolazone -follow electrolytes and renal function -daily weight, strict I's and O's and low sodium diet -will add TED hose   Acute combined systolic and diastolic heart failure  -Echocardiogram on 07/27/2014 shows an EF of 25-30% with a grade 2 diastolic dysfunction  -BNP was 23,000 K on admission; positive JVD, crackles bibasilar and 2++ edema  -continue IV lasix and start metolazone  Left pleural effusion  -Chest x-ray showed a moderate left pleural effusion  -Will place patient on Lasix and continue to monitor  -Will repeat chest x-ray in 2 days; if persist will ask IR for thoracentesis   Syncope  -Patient passed out earlier  today, which was witnessed  -Patient had echocardiogram, results as above  -carotid Doppler w/o abnormalities to justify syncope -Likely vasovagal or due to hypoxia  -will monitor  Normocytic anemia  -Patient currently at his baseline hemoglobin, continue to monitor CBC   Hypertension  -BP soft, but stable -will cut metoprolol to half dose -continue lasix   Diabetes mellitus, type II with neuropathy  -Continue levemir (adjusted dose due to hypoglycemia in am) and SSI -will hold tradjenta for now -Continue gabapentin for neuropathy  -low carb diet  Hyperlipidemia  -Continue statin   History of paroxysmal atrial fibrillation  -Continue amiodarone and metoprolol, patient is currently rate and rhythm control   BPH  -Continue Flomax   Lung cancer, left upper  -Patient finished chemotherapy in August 2015 and has also received radiation in the past  -Patient with followup with his oncologist upon discharge   Code Status: DNR Family Communication: no family at bedside  Disposition Plan: to be determine    Consultants:  None   Procedures: ECHO: 9/30 - Left ventricle: The cavity size was normal. There was mild concentric hypertrophy. Systolic function was severely reduced. The estimated ejection fraction was in the range of 25% to 30%. Wall motion was normal; there were no regional wall motion abnormalities. Features are consistent with a pseudonormal left ventricular filling pattern, with concomitant abnormal relaxation and increased filling pressure (grade 2 diastolic dysfunction). Doppler parameters are consistent with elevated ventricular end-diastolic filling pressure. - Aortic valve: Trileaflet; severely thickened, moderately calcified leaflets. Cusp separation  was moderately reduced. There was mild stenosis. Mean gradient (S): 11 mm Hg. Peak gradient (S): 18 mm Hg. Valve area (VTI): 1.56 cm^2. Valve area (Vmax): 1.57 cm^2. Valve area (Vmean): 1.36 cm^2. -  Aortic root: The aortic root was normal in size. - Mitral valve: Calcified annulus. Mildly thickened leaflets . There was moderate regurgitation. - Left atrium: The atrium was moderately dilated. - Right ventricle: The cavity size was mildly dilated. Wall thickness was normal. Systolic function was mildly reduced. - Pulmonic valve: There was no regurgitation. - Pulmonary arteries: Systolic pressure was severely increased. PA peak pressure: 63 mm Hg (S). - Inferior vena cava: The vessel was dilated. The respirophasic diameter changes were blunted (< 50%), consistent with elevated central venous pressure. - Pericardium, extracardiac: There was a large left pleural effusion.  Carotid duplex:1-39% ICA stenosis; vertebral flow antegrade   Antibiotics:  None   HPI/Subjective: Alert, awake, oriented x3, denies CP. Patient with mild JVD and bibasilar crackles; reports having some orthopnea. No further syncope events   Objective: Filed Vitals:   08/18/14 2113 08/19/14 0556 08/19/14 0614 08/19/14 1353  BP: 99/62 102/55  98/57  Pulse: 74 74  73  Temp: 97.6 F (36.4 C) 97.3 F (36.3 C)  97.9 F (36.6 C)  TempSrc: Oral Oral  Oral  Resp: 18 22  20   Height:      Weight:   91.309 kg (201 lb 4.8 oz)   SpO2: 99% 93%  99%     Exam:  General: Alert, awake, oriented x3, denies CP. Patient with mild JVD and bibasilar crackles; reports having some orthopnea. No further syncope events  HEENT: No bruits, no goiter. Mild JVD Heart: no rubs or gallops; 2-3++ edema bilaterally Lungs: positive bibasilar crackles, no wheezing Abdomen: Soft, nontender, nondistended, positive bowel sounds.  Neuro: Grossly intact, nonfocal.   Data Reviewed: Basic Metabolic Panel:  Recent Labs Lab 08/18/14 1419 08/19/14 0415  NA 141 142  K 3.7 3.6*  CL 99 100  CO2 23 29  GLUCOSE 168* 43*  BUN 22 21  CREATININE 1.28 1.12  CALCIUM 8.2* 8.3*   CBC:  Recent Labs Lab 08/18/14 1419 08/19/14 0415  WBC  5.4 5.6  HGB 9.3* 9.1*  HCT 29.2* 28.6*  MCV 93.6 93.2  PLT 154 149*   BNP (last 3 results)  Recent Labs  05/13/14 2240 07/23/14 0955 08/18/14 1512  PROBNP 1118.0* 21437.0* 22975.0*   CBG:  Recent Labs Lab 08/18/14 1423 08/18/14 2154 08/19/14 0701 08/19/14 1136  GLUCAP 172* 190* 78 206*    Recent Results (from the past 240 hour(s))  MRSA PCR SCREENING     Status: None   Collection Time    08/18/14  7:03 PM      Result Value Ref Range Status   MRSA by PCR NEGATIVE  NEGATIVE Final   Comment:            The GeneXpert MRSA Assay (FDA     approved for NASAL specimens     only), is one component of a     comprehensive MRSA colonization     surveillance program. It is not     intended to diagnose MRSA     infection nor to guide or     monitor treatment for     MRSA infections.     Performed at [facility]     Studies: Dg Chest 2 View  08/18/2014   CLINICAL DATA:  Shortness of breath, left upper lobe lung  cancer  EXAM: CHEST  2 VIEW  COMPARISON:  08/02/2014  FINDINGS: There is a moderate left pleural effusion. There is left upper lobe post treatment changes. There are increased interstitial markings in the right lung left upper lobe. Stable cardiomediastinal silhouette. Right-sided Port-A-Cath with the tip projecting over the SVC. Unremarkable osseous structures.  IMPRESSION: 1. Moderate left pleural effusion. 2. Bilateral interstitial prominence likely reflecting an element of chronic interstitial lung disease with superimposed mild interstitial edema.   Electronically Signed   By: Kathreen Devoid   On: 08/18/2014 15:27    Scheduled Meds: . amiodarone  200 mg Oral BID  . antiseptic oral rinse  7 mL Mouth Rinse BID  . aspirin  81 mg Oral BID  . atorvastatin  40 mg Oral Daily  . citalopram  20 mg Oral Daily  . enoxaparin (LOVENOX) injection  40 mg Subcutaneous Q24H  . feeding supplement (GLUCERNA SHAKE)  237 mL Oral Q24H  . finasteride  5 mg Oral Daily  .  furosemide  40 mg Intravenous BID  . gabapentin  600 mg Oral BID  . insulin aspart  0-9 Units Subcutaneous TID WC  . insulin aspart  5 Units Subcutaneous BID WC  . insulin glargine  15 Units Subcutaneous QHS  . metolazone  2.5 mg Oral Daily  . metoprolol tartrate  12.5 mg Oral Daily  . pantoprazole  40 mg Oral QPM  . potassium chloride  40 mEq Oral BID  . sodium chloride  3 mL Intravenous Q12H  . sodium chloride  3 mL Intravenous Q12H  . sucralfate  1 g Oral QID  . tamsulosin  0.4 mg Oral Daily   Continuous Infusions:    Barton Dubois  [facility] Pager [phone]. If 8PM-8AM, please contact night-coverage at [email or web address], password [facility] 08/19/2014, 5:11 PM  LOS: 1 day

## 2014-08-19 NOTE — Evaluation (Signed)
Physical Therapy Evaluation Patient Details Name: [name] [name] MRN: [id number] DOB: 04-29-1933 Today's Date: 08/19/2014   History of Present Illness  80 yo male admitted with syncope with collapse, hypoxia. Hx of DM, NHL, lung cancer, AAA, aortic stenosis, MRSA, A fib, HTN, COPD. Recent d/c from hospital on 10/6 and SNF on 10/16 per chart.   Clinical Impression  On eval, pt was Min guard assist for mobility-able to ambulate ~75 feet before experiencing SOB and fatigue. O2 sats 88% on 4L O2 with ambulation.     Follow Up Recommendations Home health PT;Supervision/Assistance - 24 hour    Equipment Recommendations  Rolling walker with 5" wheels (possibly)    Recommendations for Other Services OT consult     Precautions / Restrictions Precautions Precautions: Fall Precaution Comments: monitor 02 sats Restrictions Weight Bearing Restrictions: No      Mobility  Bed Mobility               General bed mobility comments: pt sitting EOB when PT arrived  Transfers Overall transfer level: Needs assistance Equipment used: None Transfers: Sit to/from Stand Sit to Stand: Supervision         General transfer comment: for safety  Ambulation/Gait Ambulation/Gait assistance: Min guard Ambulation Distance (Feet): 75 Feet Assistive device: Rolling walker (2 wheeled) Gait Pattern/deviations: Step-through pattern     General Gait Details: used RW for safety on today. Remained on 4L O2 during ambulation. Pt fatigued fairly easily realizing he needed to sit soon so kept distance somewhat short. No c/o dizziness. Pt complained mostly of SOB and some fatigue.   Stairs            Wheelchair Mobility    Modified Rankin (Stroke Patients Only)       Balance                                             Pertinent Vitals/Pain Pain Assessment: No/denies pain    Home Living Family/patient expects to be discharged to:: Private residence Living  Arrangements: Alone   Type of Home: Apartment Home Access: Stairs to enter   Technical brewer of Steps: 1 Home Layout: One level Home Equipment: [place] - single point      Prior Function Level of Independence: Independent               Hand Dominance        Extremity/Trunk Assessment   Upper Extremity Assessment: Defer to OT evaluation           Lower Extremity Assessment: Generalized weakness      Cervical / Trunk Assessment: Normal  Communication   Communication: No difficulties (voice is hoarse)  Cognition Arousal/Alertness: Awake/alert Behavior During Therapy: WFL for tasks assessed/performed Overall Cognitive Status: Within Functional Limits for tasks assessed                      General Comments      Exercises        Assessment/Plan    PT Assessment Patient needs continued PT services  PT Diagnosis Difficulty walking;Generalized weakness   PT Problem List Decreased strength;Decreased activity tolerance;Decreased balance;Decreased mobility;Cardiopulmonary status limiting activity  PT Treatment Interventions DME instruction;Gait training;Functional mobility training;Therapeutic activities;Therapeutic exercise;Patient/family education   PT Goals (Current goals can be found in the Care Plan section) Acute Rehab PT Goals Patient Stated Goal: return  to PLOF PT Goal Formulation: With patient Time For Goal Achievement: 09/02/14 Potential to Achieve Goals: Fair    Frequency Min 3X/week   Barriers to discharge        Co-evaluation               End of Session Equipment Utilized During Treatment: Gait belt;Oxygen Activity Tolerance: Patient limited by fatigue (Limited by dyspnea) Patient left: in bed;with call bell/phone within reach;with bed alarm set (sitting EOB)           Time: 1833-5825 PT Time Calculation (min): 25 min   Charges:   PT Evaluation $Initial PT Evaluation Tier I: 1 Procedure PT Treatments $Gait  Training: 8-22 mins $Therapeutic Activity: 8-22 mins   PT G Codes:          Weston Anna, MPT Pager: [phone]

## 2014-08-19 NOTE — Care Management Note (Addendum)
    Page 1 of 2   08/23/2014     4:26:18 PM CARE MANAGEMENT NOTE 08/23/2014  Patient:  [name] [name], [name] [name]   Account Number:  [id number]  Date Initiated:  08/19/2014  Documentation initiated by:  Dessa Phi  Subjective/Objective Assessment:   5 [place] W/RESP FAILURE,CHF,SYNCOPE.READMIT 9/26-10/6/15-CHF.     Action/Plan:   FROM HOME.HAS RW.[place].HOME 02-[facility](HAS TRAVEL FOR HOME)   Anticipated DC Date:  08/23/2014   Anticipated DC Plan:  [place]  CM consult      [facility] Choice  Resumption Of Svcs/PTA Provider   Choice offered to / List presented to:  C-1 Patient        [place] arranged  HH-1 RN  Welcome      [place] agency  [place]   Status of service:  Completed, signed off Medicare Important Message given?  YES (If response is "NO", the following Medicare IM given date fields will be blank) Date Medicare IM given:  08/23/2014 Medicare IM given by:  [facility] Date Additional Medicare IM given:   Additional Medicare IM given by:    Discharge Disposition:  [place]  Per UR Regulation:  Reviewed for med. necessity/level of care/duration of stay  If discussed at [place] of Stay Meetings, dates discussed:   08/23/2014    Comments:  08/23/14 Macee Venables RN,BSN NCM [address] DR. MADERA-[facility] DME REP [place], AWARE OF ORDER.WILL MAKE ARRANGEMENTS FOR DELVIERY TO [place] HAS BROUGHT TRAVEL HOME 02 TO HOSPITAL. FOR D/C HOME TODAY,PATIENT WILL HAVE FAMILY-TONY TO BRING IN  TRAVEL HOME 02.WILL FAX D/C ORDER, OR D/C SUMMARY ONCE RECEIVED TO [facility].  08/22/14 Hermie Reagor RN,BSN NCM [place] SPOKE TO [facility] TEL#[phone]/FAX#[phone] [facility] FAXED W/CONFIRMATION HHC ORDERS.LIKELY D/C HOME IN AM IF MEDICALLY STABLE.WILL REMIND PATIENT TO HAVE FAMILY BRING IN HOME 02.  08/19/14 Lucrecia Mcphearson  RN,BSN NCM [phone] TC [facility] TEL#[phone](PATIENT IS OUT OF [place] OFFICE SINCE HE LIVES IN [place]-THEY HAVE DIFFICULTY LOCATING PATIENT IN THEIR SYSTEM BUT FINALLY THEY FOUND HIM)SPOKE TO CELESTE,CAROL,VICKIE TO CONFIRM HE IS ACTIVE W/HHRN-MEDS-1X WK-PILL BOX REFILL, & AIDE.PT-HHPT.WILL NEED HHPT ORDER, & RESUMPTION OF HHRN, AIDE ORDER.PATIENT ALREADY HAS RW.[place] PROVIDED HOME 02-HE WILL CALL FAMILY TO BRING IN TRAVEL 02 TANK.IF D/C ON SUNDAY-YOU WILL NEED TO CALL ANSWERING SERVICE FOR ON CALL NURSE-TEL#[phone].AWAIT HHC ORDERS.MD NOTIFIED.

## 2014-08-19 NOTE — Progress Notes (Addendum)
Inpatient Diabetes Program Recommendations  AACE/ADA: New Consensus Statement on Inpatient Glycemic Control (2013)  Target Ranges:  Prepandial:   less than 140 mg/dL      Peak postprandial:   less than 180 mg/dL (1-2 hours)      Critically ill patients:  140 - 180 mg/dL    Results for KAGE, WILLMANN (MRN [id number]) as of 08/19/2014 10:12  Ref. Range 08/19/2014 04:15  Glucose Latest Range: 70-99 mg/dL 43 (LL)    Results for GREENE, DIODATO (MRN [id number]) as of 08/19/2014 10:12  Ref. Range 08/18/2014 14:30  Hemoglobin A1C Latest Range: <5.7 % 6.5 (H)     Admitted with SOB/ Syncope.  History of DM, CHF, HTN, Lung cancer.  Home DM Meds: Lantus 22 units QHS       Novolog 5 units bid with meals       Tradjenta 5 mg daily   Current DM Meds: Lantus 22 units QHS         Novolog 5 units bid         Novolog Sensitive SSI         Tradjenta 5 mg daily    **Patient with hypoglycemia this AM.  Note that patient received 5 units Novolog last night at bedtime b/c Novolog 5 units bid order was placed for 10am, 10pm.  Patient also received 22 units Lantus last PM as well.    **Called pharmacy today to correct this.  Order to be changed to Novolog 5 units bid with meals (breakfast and supper).  This is likely the culprit of patient's Hypoglycemia this AM.   MD- Please consider slight reduction of patient's home dose of Lantus (20% reduction) to Lantus 18 units QHS    Will follow Wyn Quaker RN, MSN, CDE Diabetes Coordinator Inpatient Diabetes Program Team Pager: [phone] (8a-10p)

## 2014-08-20 DIAGNOSIS — C3412 Malignant neoplasm of upper lobe, left bronchus or lung: Secondary | ICD-10-CM

## 2014-08-20 DIAGNOSIS — I5033 Acute on chronic diastolic (congestive) heart failure: Secondary | ICD-10-CM

## 2014-08-20 LAB — BASIC METABOLIC PANEL
ANION GAP: 12 (ref 5–15)
BUN: 18 mg/dL (ref 6–23)
CALCIUM: 8.2 mg/dL — AB (ref 8.4–10.5)
CO2: 30 mEq/L (ref 19–32)
Chloride: 95 mEq/L — ABNORMAL LOW (ref 96–112)
Creatinine, Ser: 0.99 mg/dL (ref 0.50–1.35)
GFR calc Af Amer: 88 mL/min — ABNORMAL LOW (ref >90)
GFR, EST NON AFRICAN AMERICAN: 76 mL/min — AB (ref >90)
GLUCOSE: 104 mg/dL — AB (ref 70–99)
Potassium: 3.7 mEq/L (ref 3.7–5.3)
Sodium: 137 mEq/L (ref 137–147)

## 2014-08-20 LAB — GLUCOSE, CAPILLARY
GLUCOSE-CAPILLARY: 62 mg/dL — AB (ref 70–99)
GLUCOSE-CAPILLARY: 184 mg/dL — AB (ref 70–99)
Glucose-Capillary: 91 mg/dL (ref 70–99)
Glucose-Capillary: 194 mg/dL — ABNORMAL HIGH (ref 70–99)

## 2014-08-20 MED ORDER — POTASSIUM CHLORIDE CRYS ER 20 MEQ PO TBCR
40.0000 meq | EXTENDED_RELEASE_TABLET | Freq: Two times a day (BID) | ORAL | Status: DC
  Administered 2014-08-20 – 2014-08-23 (×6): 40 meq via ORAL
  Filled 2014-08-20 (×5): qty 2

## 2014-08-20 NOTE — Progress Notes (Signed)
[facility] PROGRESS NOTE Interim History: 80 y.o. male With a history of combined systolic and diastolic heart failure, diabetes mellitus, hypertension, lung cancer, that presented to the emergency department with complaints of passing out as well as increased shortness of breath. Patient passed out earlier today while on his way to the doctor's appointment. His son then brought him into the emergency department. Patient denied any dizziness prior to his syncopal episode. Patient currently denies any chest pain, abdominal pain, nausea, vomiting, ill contacts or recent travel. BNP elevated at 23K and CXR with vascular congestion/interstitial edema.   Filed Weights   08/18/14 1859 08/19/14 0614 08/20/14 0434  Weight: 89.9 kg (198 lb 3.1 oz) 91.309 kg (201 lb 4.8 oz) 89.948 kg (198 lb 4.8 oz)        Intake/Output Summary (Last 24 hours) at 08/20/14 1529 Last data filed at 08/20/14 0913  Gross per 24 hour  Intake    860 ml  Output    650 ml  Net    210 ml     Assessment/Plan:  Acute on chronic respiratory failure  -Likely multifactorial including possible CHF exacerbation versus history of lung cancer and malignant pleural effusion  -Patient was hypoxic to 82% upon admission. Now 98-100% -Patient currently at his baseline, requiring 4 L of oxygen via nasal cannula  -will continue IV lasix and metolazone -follow electrolytes and renal function -daily weight, strict I's and O's and low sodium diet -will add TED hose   Acute combined systolic and diastolic heart failure  -Echocardiogram on 07/27/2014 shows an EF of 25-30% with a grade 2 diastolic dysfunction  -BNP was 23,000 K on admission; positive JVD, fine crackles bibasilar and 2++ edema  -continue IV lasix and metolazone -continue TED hoses -improving slowly  Left pleural effusion  -Chest x-ray showed a moderate left pleural effusion  -Will place patient on Lasix and continue to monitor  -Will repeat chest x-ray in  am; if persist will ask IR for thoracentesis   Syncope  -Patient passed out earlier today, which was witnessed  -Patient had echocardiogram, results as above  -carotid Doppler w/o abnormalities to justify syncope -Likely vasovagal or due to hypoxia  -will monitor  Normocytic anemia  -Patient currently at his baseline hemoglobin, continue to monitor CBC   Hypertension  -BP soft, but stable -will continue metoprolol half dose -continue lasix   Diabetes mellitus, type II with neuropathy  -Continue levemir (adjusted dose due to hypoglycemia in am) and SSI -will hold tradjenta for now -Continue gabapentin for neuropathy  -low carb diet  Hyperlipidemia  -Continue statin   History of paroxysmal atrial fibrillation  -Continue amiodarone and metoprolol, patient is currently rate and rhythm control   BPH  -Continue Flomax   Lung cancer, left upper  -Patient finished chemotherapy in August 2015 and has also received radiation in the past  -Patient with followup with his oncologist upon discharge   Code Status: DNR Family Communication: no family at bedside  Disposition Plan: to be determine    Consultants:  None   Procedures: ECHO: 9/30 - Left ventricle: The cavity size was normal. There was mild concentric hypertrophy. Systolic function was severely reduced. The estimated ejection fraction was in the range of 25% to 30%. Wall motion was normal; there were no regional wall motion abnormalities. Features are consistent with a pseudonormal left ventricular filling pattern, with concomitant abnormal relaxation and increased filling pressure (grade 2 diastolic dysfunction). Doppler parameters are consistent with elevated ventricular end-diastolic filling  pressure. - Aortic valve: Trileaflet; severely thickened, moderately calcified leaflets. Cusp separation was moderately reduced. There was mild stenosis. Mean gradient (S): 11 mm Hg. Peak gradient (S): 18 mm Hg. Valve  area (VTI): 1.56 cm^2. Valve area (Vmax): 1.57 cm^2. Valve area (Vmean): 1.36 cm^2. - Aortic root: The aortic root was normal in size. - Mitral valve: Calcified annulus. Mildly thickened leaflets . There was moderate regurgitation. - Left atrium: The atrium was moderately dilated. - Right ventricle: The cavity size was mildly dilated. Wall thickness was normal. Systolic function was mildly reduced. - Pulmonic valve: There was no regurgitation. - Pulmonary arteries: Systolic pressure was severely increased. PA peak pressure: 63 mm Hg (S). - Inferior vena cava: The vessel was dilated. The respirophasic diameter changes were blunted (< 50%), consistent with elevated central venous pressure. - Pericardium, extracardiac: There was a large left pleural effusion.  Carotid duplex:1-39% ICA stenosis; vertebral flow antegrade   Antibiotics:  None   HPI/Subjective: Alert, awake, oriented x3, denies CP. Patient with mild JVD and fine bibasilar crackles (L > R); reports orthopnea symptom is improving. No further syncope events   Objective: Filed Vitals:   08/19/14 2102 08/20/14 0434 08/20/14 0613 08/20/14 1427  BP: 94/57  113/61 99/60  Pulse: 76  75 74  Temp: 97.7 F (36.5 C)  97.2 F (36.2 C) 97.7 F (36.5 C)  TempSrc: Oral  Oral Oral  Resp: 20  20 20   Height:      Weight:  89.948 kg (198 lb 4.8 oz)    SpO2: 98%  98% 100%     Exam:  General: Alert, awake, oriented x3, denies CP. Patient with mild JVD and fine bibasilar crackles (L > R); reports orthopnea symptom is improving. No further syncope events  HEENT: No bruits, no goiter. Mild JVD Heart: no rubs or gallops; 2++ edema bilaterally Lungs: positive fine bibasilar crackles, no wheezing Abdomen: Soft, nontender, nondistended, positive bowel sounds.  Neuro: Grossly intact, nonfocal.   Data Reviewed: Basic Metabolic Panel:  Recent Labs Lab 08/18/14 1419 08/19/14 0415 08/20/14 0850  NA 141 142 137  K 3.7 3.6* 3.7    CL 99 100 95*  CO2 23 29 30   GLUCOSE 168* 43* 104*  BUN 22 21 18   CREATININE 1.28 1.12 0.99  CALCIUM 8.2* 8.3* 8.2*   CBC:  Recent Labs Lab 08/18/14 1419 08/19/14 0415  WBC 5.4 5.6  HGB 9.3* 9.1*  HCT 29.2* 28.6*  MCV 93.6 93.2  PLT 154 149*   BNP (last 3 results)  Recent Labs  05/13/14 2240 07/23/14 0955 08/18/14 1512  PROBNP 1118.0* 21437.0* 22975.0*   CBG:  Recent Labs Lab 08/19/14 0701 08/19/14 1136 08/19/14 1711 08/19/14 2111 08/20/14 0744  GLUCAP 78 206* 80 198* 62*    Recent Results (from the past 240 hour(s))  MRSA PCR SCREENING     Status: None   Collection Time    08/18/14  7:03 PM      Result Value Ref Range Status   MRSA by PCR NEGATIVE  NEGATIVE Final   Comment:            The GeneXpert MRSA Assay (FDA     approved for NASAL specimens     only), is one component of a     comprehensive MRSA colonization     surveillance program. It is not     intended to diagnose MRSA     infection nor to guide or     monitor treatment for  MRSA infections.     Performed at [facility]     Studies: No results found.  Scheduled Meds: . amiodarone  200 mg Oral BID  . antiseptic oral rinse  7 mL Mouth Rinse BID  . aspirin  81 mg Oral BID  . atorvastatin  40 mg Oral Daily  . citalopram  20 mg Oral Daily  . enoxaparin (LOVENOX) injection  40 mg Subcutaneous Q24H  . feeding supplement (GLUCERNA SHAKE)  237 mL Oral Q24H  . finasteride  5 mg Oral Daily  . furosemide  40 mg Intravenous BID  . gabapentin  600 mg Oral BID  . insulin aspart  0-9 Units Subcutaneous TID WC  . insulin aspart  5 Units Subcutaneous BID WC  . insulin glargine  15 Units Subcutaneous QHS  . metolazone  2.5 mg Oral Daily  . metoprolol succinate  12.5 mg Oral Daily  . pantoprazole  40 mg Oral QPM  . potassium chloride  40 mEq Oral BID  . sodium chloride  3 mL Intravenous Q12H  . sodium chloride  3 mL Intravenous Q12H  . sucralfate  1 g Oral QID  . tamsulosin  0.4  mg Oral Daily   Continuous Infusions:    Barton Dubois  [facility] Pager [phone]. If 8PM-8AM, please contact night-coverage at [email or web address], password [facility] 08/20/2014, 3:29 PM  LOS: 2 days

## 2014-08-21 ENCOUNTER — Inpatient Hospital Stay (HOSPITAL_COMMUNITY): Payer: Medicare Other

## 2014-08-21 DIAGNOSIS — J948 Other specified pleural conditions: Secondary | ICD-10-CM

## 2014-08-21 LAB — BASIC METABOLIC PANEL
ANION GAP: 9 (ref 5–15)
BUN: 17 mg/dL (ref 6–23)
CO2: 35 mEq/L — ABNORMAL HIGH (ref 19–32)
CREATININE: 1.12 mg/dL (ref 0.50–1.35)
Calcium: 8.1 mg/dL — ABNORMAL LOW (ref 8.4–10.5)
Chloride: 91 mEq/L — ABNORMAL LOW (ref 96–112)
GFR calc Af Amer: 70 mL/min — ABNORMAL LOW (ref >90)
GFR, EST NON AFRICAN AMERICAN: 61 mL/min — AB (ref >90)
Glucose, Bld: 128 mg/dL — ABNORMAL HIGH (ref 70–99)
POTASSIUM: 4.2 meq/L (ref 3.7–5.3)
Sodium: 135 mEq/L — ABNORMAL LOW (ref 137–147)

## 2014-08-21 LAB — GLUCOSE, CAPILLARY
Glucose-Capillary: 111 mg/dL — ABNORMAL HIGH (ref 70–99)
Glucose-Capillary: 273 mg/dL — ABNORMAL HIGH (ref 70–99)
Glucose-Capillary: 163 mg/dL — ABNORMAL HIGH (ref 70–99)
Glucose-Capillary: 176 mg/dL — ABNORMAL HIGH (ref 70–99)

## 2014-08-21 MED ORDER — SODIUM CHLORIDE 0.9 % IV BOLUS (SEPSIS): 500.0000 mL | Freq: Once | INTRAVENOUS | Status: DC

## 2014-08-21 MED ORDER — FUROSEMIDE 40 MG PO TABS
40.0000 mg | ORAL_TABLET | Freq: Every day | ORAL | Status: DC
  Administered 2014-08-22 – 2014-08-23 (×2): 40 mg via ORAL
  Filled 2014-08-21 (×2): qty 1

## 2014-08-21 NOTE — Progress Notes (Signed)
Pt's am BP 92/58 and 85/54. Rechecked pt's BP manually 88-90/50. Patient asymptomatic. Karolee Stamps, PA-C notified. Order to hold am diuretics.  Will continue to monitor. Wendee Beavers [place]

## 2014-08-21 NOTE — Progress Notes (Addendum)
[facility] PROGRESS NOTE Interim History: 80 y.o. male With a history of combined systolic and diastolic heart failure, diabetes mellitus, hypertension, lung cancer, that presented to the emergency department with complaints of passing out as well as increased shortness of breath. Patient passed out earlier today while on his way to the doctor's appointment. His son then brought him into the emergency department. Patient denied any dizziness prior to his syncopal episode. Patient currently denies any chest pain, abdominal pain, nausea, vomiting, ill contacts or recent travel. BNP elevated at 23K and CXR with vascular congestion/interstitial edema.   Filed Weights   08/19/14 8099 08/20/14 0434 08/21/14 0441  Weight: 91.309 kg (201 lb 4.8 oz) 89.948 kg (198 lb 4.8 oz) 88.179 kg (194 lb 6.4 oz)        Intake/Output Summary (Last 24 hours) at 08/21/14 1755 Last data filed at 08/21/14 1445  Gross per 24 hour  Intake    600 ml  Output    650 ml  Net    -50 ml     Assessment/Plan:  Acute on chronic respiratory failure  -Likely multifactorial including possible CHF exacerbation versus history of lung cancer and malignant pleural effusion  -Patient was hypoxic to 82% upon admission. Now 98-100% -Patient currently at his baseline, requiring 4 L of oxygen via nasal cannula  -will change lasix to PO -follow electrolytes and renal function -daily weight, strict I's and O's and low sodium diet -will continue TED hose (12 hours on and 12 hours off)  Acute combined systolic and diastolic heart failure  -Echocardiogram on 07/27/2014 shows an EF of 25-30% with a grade 2 diastolic dysfunction  -BNPto be repeated in am -continue lasix PO now -continue TED hoses -vascular congestion and LE edema much improved/ probably at baseline -dry weight 19-194 base on office notes -ACE/ARB not on board due to low BP  Left pleural effusion  -Chest x-ray showed a moderate left pleural effusion    -Will place patient on Lasix and continue to monitor  -repeated x-ray demonstrated persistent effusion and loculation; will ask IR for thoracentesis   Syncope  -Patient passed out earlier today, which was witnessed  -Patient had echocardiogram, results as above  -carotid Doppler w/o abnormalities to justify syncope -Likely vasovagal or due to hypoxia  -will monitor  Normocytic anemia  -Patient currently at his baseline hemoglobin, continue to monitor CBC   Hypertension  -BP soft, will hold IV diuretics and switch him back to PO lasix 40mg  daily -metoprolol dose cut in half -repeated CXR demonstrated significant improvement in his vascular congestion and demonstrated loculated left pleural effusion (moderate size). -will arrange for thoracentesis    Diabetes mellitus, type II with neuropathy  -Continue levemir (adjusted dose due to hypoglycemia in am) and SSI -will hold tradjenta for now -Continue gabapentin for neuropathy  -low carb diet  Hyperlipidemia  -Continue statin   History of paroxysmal atrial fibrillation  -Continue amiodarone and metoprolol, patient is currently rate and rhythm control   BPH  -Continue Flomax   Lung cancer, left upper  -Patient finished chemotherapy in August 2015 and has also received radiation in the past  -Patient with followup with his oncologist upon discharge   Code Status: DNR Family Communication: no family at bedside  Disposition Plan: to be determine    Consultants:  None   Procedures: ECHO: 9/30 - Left ventricle: The cavity size was normal. There was mild concentric hypertrophy. Systolic function was severely reduced. The estimated ejection fraction was in  the range of 25% to 30%. Wall motion was normal; there were no regional wall motion abnormalities. Features are consistent with a pseudonormal left ventricular filling pattern, with concomitant abnormal relaxation and increased filling pressure (grade 2 diastolic  dysfunction). Doppler parameters are consistent with elevated ventricular end-diastolic filling pressure. - Aortic valve: Trileaflet; severely thickened, moderately calcified leaflets. Cusp separation was moderately reduced. There was mild stenosis. Mean gradient (S): 11 mm Hg. Peak gradient (S): 18 mm Hg. Valve area (VTI): 1.56 cm^2. Valve area (Vmax): 1.57 cm^2. Valve area (Vmean): 1.36 cm^2. - Aortic root: The aortic root was normal in size. - Mitral valve: Calcified annulus. Mildly thickened leaflets . There was moderate regurgitation. - Left atrium: The atrium was moderately dilated. - Right ventricle: The cavity size was mildly dilated. Wall thickness was normal. Systolic function was mildly reduced. - Pulmonic valve: There was no regurgitation. - Pulmonary arteries: Systolic pressure was severely increased. PA peak pressure: 63 mm Hg (S). - Inferior vena cava: The vessel was dilated. The respirophasic diameter changes were blunted (< 50%), consistent with elevated central venous pressure. - Pericardium, extracardiac: There was a large left pleural effusion.  Carotid duplex:1-39% ICA stenosis; vertebral flow antegrade   Antibiotics:  None   HPI/Subjective:  Alert, awake, oriented x3, denies CP. Patient without JVD and no frank crackles. No further syncope events   Objective: Filed Vitals:   08/21/14 0441 08/21/14 0445 08/21/14 0500 08/21/14 1444  BP: 92/58 85/54  91/58  Pulse: 71   72  Temp: 98.3 F (36.8 C)   97.5 F (36.4 C)  TempSrc: Oral   Oral  Resp: 18   18  Height:      Weight: 88.179 kg (194 lb 6.4 oz)     SpO2:   99% 98%     Exam:  General: Alert, awake, oriented x3, denies CP. Patient without JVD and no frank crackles. endorses orthopnea symptom continue improving. No further syncope events. BP very soft this morning.  HEENT: No bruits, no goiter. No JVD Heart: no rubs or gallops; 2++ edema bilaterally Lungs: positive rhonchi on left side, improved  air movement otherwise Abdomen: Soft, nontender, nondistended, positive bowel sounds.  Neuro: Grossly intact, nonfocal.   Data Reviewed: Basic Metabolic Panel:  Recent Labs Lab 08/18/14 1419 08/19/14 0415 08/20/14 0850 08/21/14 0450  NA 141 142 137 135*  K 3.7 3.6* 3.7 4.2  CL 99 100 95* 91*  CO2 23 29 30  35*  GLUCOSE 168* 43* 104* 128*  BUN 22 21 18 17   CREATININE 1.28 1.12 0.99 1.12  CALCIUM 8.2* 8.3* 8.2* 8.1*   CBC:  Recent Labs Lab 08/18/14 1419 08/19/14 0415  WBC 5.4 5.6  HGB 9.3* 9.1*  HCT 29.2* 28.6*  MCV 93.6 93.2  PLT 154 149*   BNP (last 3 results)  Recent Labs  05/13/14 2240 07/23/14 0955 08/18/14 1512  PROBNP 1118.0* 21437.0* 22975.0*   CBG:  Recent Labs Lab 08/20/14 1631 08/20/14 2146 08/21/14 0733 08/21/14 1155 08/21/14 1659  GLUCAP 91 194* 111* 273* 163*    Recent Results (from the past 240 hour(s))  MRSA PCR SCREENING     Status: None   Collection Time    08/18/14  7:03 PM      Result Value Ref Range Status   MRSA by PCR NEGATIVE  NEGATIVE Final   Comment:            The GeneXpert MRSA Assay (FDA     approved for NASAL specimens  only), is one component of a     comprehensive MRSA colonization     surveillance program. It is not     intended to diagnose MRSA     infection nor to guide or     monitor treatment for     MRSA infections.     Performed at [facility]     Studies: Dg Chest 2 View  08/21/2014   CLINICAL DATA:  80 year old male with shortness of breath. History of lung cancer.  EXAM: CHEST  2 VIEW  COMPARISON:  Chest x-ray 08/18/2014.  FINDINGS: Moderate left pleural effusion, potentially a partially loculated given the unusual appearance at the left lung base. Trace right pleural effusion. Cephalization of the pulmonary vasculature with indistinct interstitial markings and diffuse peribronchial cuffing. Mass like opacity in the left hilar and suprahilar region with extensive interstitial opacities  throughout the left upper lung, compatible with known of mass with probable postradiation changes. Cardiac silhouette is largely obscured, but appears enlarged. Upper mediastinal contours are partially obscured. Atherosclerosis in the thoracic aorta. Right internal jugular single-lumen power porta cath with tip terminating at the superior cavoatrial junction.  IMPRESSION: 1. Appearance the chest suggests mild congestive failure. 2. Partially loculated moderate left pleural effusion. 3. Known left upper lobe mass with probable surrounding postradiation changes in the left upper lobe. However, the possibility of pneumonia in the left upper lobe is not excluded, and clinical correlation is recommended.   Electronically Signed   By: Vinnie Langton M.D.   On: 08/21/2014 08:56    Scheduled Meds: . amiodarone  200 mg Oral BID  . antiseptic oral rinse  7 mL Mouth Rinse BID  . aspirin  81 mg Oral BID  . atorvastatin  40 mg Oral Daily  . citalopram  20 mg Oral Daily  . enoxaparin (LOVENOX) injection  40 mg Subcutaneous Q24H  . feeding supplement (GLUCERNA SHAKE)  237 mL Oral Q24H  . finasteride  5 mg Oral Daily  . [START ON 08/22/2014] furosemide  40 mg Oral Daily  . gabapentin  600 mg Oral BID  . insulin aspart  0-9 Units Subcutaneous TID WC  . insulin aspart  5 Units Subcutaneous BID WC  . insulin glargine  15 Units Subcutaneous QHS  . metoprolol succinate  12.5 mg Oral Daily  . pantoprazole  40 mg Oral QPM  . potassium chloride  40 mEq Oral BID  . sodium chloride  3 mL Intravenous Q12H  . sodium chloride  3 mL Intravenous Q12H  . sucralfate  1 g Oral QID  . tamsulosin  0.4 mg Oral Daily   Continuous Infusions:   Time: 30 minutes  Barton Dubois  [facility] Pager [phone]. If 8PM-8AM, please contact night-coverage at [email or web address], password [facility] 08/21/2014, 5:55 PM  LOS: 3 days

## 2014-08-22 ENCOUNTER — Other Ambulatory Visit: Payer: Medicare Other

## 2014-08-22 ENCOUNTER — Inpatient Hospital Stay (HOSPITAL_COMMUNITY): Payer: Medicare Other

## 2014-08-22 ENCOUNTER — Ambulatory Visit: Payer: Medicare Other | Admitting: Nurse Practitioner

## 2014-08-22 DIAGNOSIS — J9611 Chronic respiratory failure with hypoxia: Secondary | ICD-10-CM

## 2014-08-22 LAB — BASIC METABOLIC PANEL
Anion gap: 9 (ref 5–15)
BUN: 16 mg/dL (ref 6–23)
CO2: 33 meq/L — AB (ref 19–32)
Calcium: 8 mg/dL — ABNORMAL LOW (ref 8.4–10.5)
Chloride: 92 mEq/L — ABNORMAL LOW (ref 96–112)
Creatinine, Ser: 0.9 mg/dL (ref 0.50–1.35)
GFR calc Af Amer: >90 mL/min (ref >90)
GFR calc non Af Amer: 79 mL/min — ABNORMAL LOW (ref >90)
GLUCOSE: 107 mg/dL — AB (ref 70–99)
POTASSIUM: 4.1 meq/L (ref 3.7–5.3)
Sodium: 134 mEq/L — ABNORMAL LOW (ref 137–147)

## 2014-08-22 LAB — GLUCOSE, CAPILLARY
GLUCOSE-CAPILLARY: 150 mg/dL — AB (ref 70–99)
GLUCOSE-CAPILLARY: 235 mg/dL — AB (ref 70–99)
Glucose-Capillary: 99 mg/dL (ref 70–99)

## 2014-08-22 LAB — PRO B NATRIURETIC PEPTIDE: Pro B Natriuretic peptide (BNP): 11824 pg/mL — ABNORMAL HIGH (ref 0–450)

## 2014-08-22 NOTE — Progress Notes (Signed)
[facility] PROGRESS NOTE Interim History: 80 y.o. male With a history of combined systolic and diastolic heart failure, diabetes mellitus, hypertension, lung cancer, that presented to the emergency department with complaints of passing out as well as increased shortness of breath. Patient passed out earlier today while on his way to the doctor's appointment. His son then brought him into the emergency department. Patient denied any dizziness prior to his syncopal episode. Patient currently denies any chest pain, abdominal pain, nausea, vomiting, ill contacts or recent travel. BNP elevated at 23K and CXR with vascular congestion/interstitial edema.   Filed Weights   08/20/14 0434 08/21/14 0441 08/22/14 0505  Weight: 89.948 kg (198 lb 4.8 oz) 88.179 kg (194 lb 6.4 oz) 89.721 kg (197 lb 12.8 oz)        Intake/Output Summary (Last 24 hours) at 08/22/14 1111 Last data filed at 08/21/14 2300  Gross per 24 hour  Intake    240 ml  Output    150 ml  Net     90 ml     Assessment/Plan:  Acute on chronic respiratory failure  -Likely multifactorial including possible CHF exacerbation versus history of lung cancer and malignant pleural effusion  -Patient was hypoxic to 82% upon admission. Now 98-100% -Patient currently at his baseline, requiring 4 L of oxygen via nasal cannula  -will continue lasix 40mg  PO -follow electrolytes and renal function -daily weight, strict I's and O's and low sodium diet -will continue TED hose (12 hours on and 12 hours off)  Acute combined systolic and diastolic heart failure  -Echocardiogram on 07/27/2014 shows an EF of 25-30% with a grade 2 diastolic dysfunction  -BNPto be repeated in am -continue lasix PO now -continue TED hoses -vascular congestion and LE edema much improved/ probably at baseline -dry weight 19-194 base on office notes -ACE/ARB not on board due to low BP  Left pleural effusion  -Chest x-ray showed a moderate left pleural effusion   -Will place patient on Lasix and continue to monitor  -repeated x-ray demonstrated persistent effusion and loculation; will ask IR for thoracentesis   Syncope  -Patient passed out earlier today, which was witnessed  -Patient had echocardiogram, results as above  -carotid Doppler w/o abnormalities to justify syncope -Likely vasovagal or due to hypoxia  -will monitor  Normocytic anemia  -Patient currently at his baseline hemoglobin, continue to monitor CBC   Hypertension  -BP soft but stable again -continue lasix 40mg  daily by mouth -metoprolol dose cut in half -repeated CXR demonstrated significant improvement in his vascular congestion and demonstrated loculated left pleural effusion (moderate size). -will have thoracentesis today 10/26   Diabetes mellitus, type II with neuropathy  -Continue levemir (adjusted dose due to hypoglycemia in am) and SSI -will hold tradjenta for now -Continue gabapentin for neuropathy  -low carb diet  Hyperlipidemia  -Continue statin   History of paroxysmal atrial fibrillation  -Continue amiodarone and metoprolol, patient is currently rate and rhythm control   BPH  -Continue Flomax   Lung cancer, left upper  -Patient finished chemotherapy in August 2015 and has also received radiation in the past  -Patient with followup with his oncologist upon discharge   Code Status: DNR Family Communication: no family at bedside  Disposition Plan: to be determine    Consultants:  None   Procedures: ECHO: 9/30 - Left ventricle: The cavity size was normal. There was mild concentric hypertrophy. Systolic function was severely reduced. The estimated ejection fraction was in the range of 25%  to 30%. Wall motion was normal; there were no regional wall motion abnormalities. Features are consistent with a pseudonormal left ventricular filling pattern, with concomitant abnormal relaxation and increased filling pressure (grade 2 diastolic  dysfunction). Doppler parameters are consistent with elevated ventricular end-diastolic filling pressure. - Aortic valve: Trileaflet; severely thickened, moderately calcified leaflets. Cusp separation was moderately reduced. There was mild stenosis. Mean gradient (S): 11 mm Hg. Peak gradient (S): 18 mm Hg. Valve area (VTI): 1.56 cm^2. Valve area (Vmax): 1.57 cm^2. Valve area (Vmean): 1.36 cm^2. - Aortic root: The aortic root was normal in size. - Mitral valve: Calcified annulus. Mildly thickened leaflets . There was moderate regurgitation. - Left atrium: The atrium was moderately dilated. - Right ventricle: The cavity size was mildly dilated. Wall thickness was normal. Systolic function was mildly reduced. - Pulmonic valve: There was no regurgitation. - Pulmonary arteries: Systolic pressure was severely increased. PA peak pressure: 63 mm Hg (S). - Inferior vena cava: The vessel was dilated. The respirophasic diameter changes were blunted (< 50%), consistent with elevated central venous pressure. - Pericardium, extracardiac: There was a large left pleural effusion.  Carotid duplex:1-39% ICA stenosis; vertebral flow antegrade   Thoracentesis 10/26  Antibiotics:  None   HPI/Subjective:  Alert, awake, oriented x3, denies CP. Patient will have thoracentesis today (10/26)  Objective: Filed Vitals:   08/21/14 2055 08/22/14 0505 08/22/14 0512 08/22/14 0514  BP: 100/58  89/50 92/55  Pulse: 80  75   Temp: 98.4 F (36.9 C)  97.6 F (36.4 C)   TempSrc: Oral  Oral   Resp: 20  18   Height:      Weight:  89.721 kg (197 lb 12.8 oz)    SpO2: 98%  100%      Exam:  General: Alert, awake, oriented x3, denies CP. Patient without JVD and no frank crackles. endorses orthopnea symptom pretty much at baseline. No further syncope events. BP very soft this morning.  HEENT: No bruits, no goiter. No JVD Heart: no rubs or gallops; 2+ edema bilaterally Lungs: positive rhonchi on left side,  improved air movement otherwise Abdomen: Soft, nontender, nondistended, positive bowel sounds.  Neuro: Grossly intact, nonfocal.   Data Reviewed: Basic Metabolic Panel:  Recent Labs Lab 08/18/14 1419 08/19/14 0415 08/20/14 0850 08/21/14 0450 08/22/14 0535  NA 141 142 137 135* 134*  K 3.7 3.6* 3.7 4.2 4.1  CL 99 100 95* 91* 92*  CO2 23 29 30  35* 33*  GLUCOSE 168* 43* 104* 128* 107*  BUN 22 21 18 17 16   CREATININE 1.28 1.12 0.99 1.12 0.90  CALCIUM 8.2* 8.3* 8.2* 8.1* 8.0*   CBC:  Recent Labs Lab 08/18/14 1419 08/19/14 0415  WBC 5.4 5.6  HGB 9.3* 9.1*  HCT 29.2* 28.6*  MCV 93.6 93.2  PLT 154 149*   BNP (last 3 results)  Recent Labs  07/23/14 0955 08/18/14 1512 08/22/14 0535  PROBNP 21437.0* 22975.0* 11824.0*   CBG:  Recent Labs Lab 08/21/14 0733 08/21/14 1155 08/21/14 1659 08/21/14 2131 08/22/14 0750  GLUCAP 111* 273* 163* 176* 99    Recent Results (from the past 240 hour(s))  MRSA PCR SCREENING     Status: None   Collection Time    08/18/14  7:03 PM      Result Value Ref Range Status   MRSA by PCR NEGATIVE  NEGATIVE Final   Comment:            The GeneXpert MRSA Assay (FDA  approved for NASAL specimens     only), is one component of a     comprehensive MRSA colonization     surveillance program. It is not     intended to diagnose MRSA     infection nor to guide or     monitor treatment for     MRSA infections.     Performed at [facility]     Studies: Dg Chest 2 View  08/21/2014   CLINICAL DATA:  80 year old male with shortness of breath. History of lung cancer.  EXAM: CHEST  2 VIEW  COMPARISON:  Chest x-ray 08/18/2014.  FINDINGS: Moderate left pleural effusion, potentially a partially loculated given the unusual appearance at the left lung base. Trace right pleural effusion. Cephalization of the pulmonary vasculature with indistinct interstitial markings and diffuse peribronchial cuffing. Mass like opacity in the left hilar and  suprahilar region with extensive interstitial opacities throughout the left upper lung, compatible with known of mass with probable postradiation changes. Cardiac silhouette is largely obscured, but appears enlarged. Upper mediastinal contours are partially obscured. Atherosclerosis in the thoracic aorta. Right internal jugular single-lumen power porta cath with tip terminating at the superior cavoatrial junction.  IMPRESSION: 1. Appearance the chest suggests mild congestive failure. 2. Partially loculated moderate left pleural effusion. 3. Known left upper lobe mass with probable surrounding postradiation changes in the left upper lobe. However, the possibility of pneumonia in the left upper lobe is not excluded, and clinical correlation is recommended.   Electronically Signed   By: Vinnie Langton M.D.   On: 08/21/2014 08:56    Scheduled Meds: . amiodarone  200 mg Oral BID  . antiseptic oral rinse  7 mL Mouth Rinse BID  . aspirin  81 mg Oral BID  . atorvastatin  40 mg Oral Daily  . citalopram  20 mg Oral Daily  . enoxaparin (LOVENOX) injection  40 mg Subcutaneous Q24H  . feeding supplement (GLUCERNA SHAKE)  237 mL Oral Q24H  . finasteride  5 mg Oral Daily  . furosemide  40 mg Oral Daily  . gabapentin  600 mg Oral BID  . insulin aspart  0-9 Units Subcutaneous TID WC  . insulin aspart  5 Units Subcutaneous BID WC  . insulin glargine  15 Units Subcutaneous QHS  . metoprolol succinate  12.5 mg Oral Daily  . pantoprazole  40 mg Oral QPM  . potassium chloride  40 mEq Oral BID  . sodium chloride  3 mL Intravenous Q12H  . sodium chloride  3 mL Intravenous Q12H  . sucralfate  1 g Oral QID  . tamsulosin  0.4 mg Oral Daily   Continuous Infusions:   Time: 30 minutes  Barton Dubois  [facility] Pager [phone]. If 8PM-8AM, please contact night-coverage at [email or web address], password [facility] 08/22/2014, 11:11 AM  LOS: 4 days

## 2014-08-22 NOTE — Progress Notes (Addendum)
Physical Therapy Treatment Patient Details Name: [name] [name] MRN: [id number] DOB: 1933/05/24 Today's Date: 08/22/2014    History of Present Illness 80 yo male admitted with syncope with collapse, hypoxia. Hx of DM, NHL, lung cancer, AAA, aortic stenosis, MRSA, A fib, HTN, COPD. Recent d/c from hospital on 10/6 and SNF on 10/16 per chart.     PT Comments    Progressing with mobility and activity tolerance. Pt states plan is for d/c home on tomorrow.   Follow Up Recommendations  Home health PT;Supervision - Intermittent     Equipment Recommendations  None recommended by PT (has cane available at home)    Recommendations for Other Services OT consult     Precautions / Restrictions Precautions Precautions: Fall Precaution Comments: monitor 02 sats Restrictions Weight Bearing Restrictions: No    Mobility  Bed Mobility                  Transfers Overall transfer level: Needs assistance   Transfers: Sit to/from Stand Sit to Stand: Supervision            Ambulation/Gait Ambulation/Gait assistance: Min guard Ambulation Distance (Feet): 150 Feet Assistive device:  (IV pole) Gait Pattern/deviations: Step-through pattern     General Gait Details: Tolerated activity well. Remained on 4L O2. Pt held onto IV pole. No LOB. No c/o dizziness/lightheadedness   Stairs            Wheelchair Mobility    Modified Rankin (Stroke Patients Only)       Balance                                    Cognition Arousal/Alertness: Awake/alert Behavior During Therapy: WFL for tasks assessed/performed Overall Cognitive Status: Within Functional Limits for tasks assessed                      Exercises      General Comments        Pertinent Vitals/Pain Pain Assessment: No/denies pain    Home Living                      Prior Function            PT Goals (current goals can now be found in the care plan section) Progress  towards PT goals: Progressing toward goals    Frequency  Min 3X/week    PT Plan Current plan remains appropriate    Co-evaluation             End of Session Equipment Utilized During Treatment: Gait belt;Oxygen Activity Tolerance: Patient tolerated treatment well Patient left: in bed;with call bell/phone within reach (sitting EOB)     Time: 7209-4709 PT Time Calculation (min): 15 min  Charges:  $Gait Training: 8-22 mins                    G Codes:      Weston Anna, MPT Pager: [phone]

## 2014-08-22 NOTE — Procedures (Signed)
Successful US guided left thoracentesis. Yielded 1.4L of clear, dark yellow fluid. Pt tolerated procedure well. No immediate complications.  Specimen was not sent for labs. CXR ordered.  Ascencion Dike PA-C 08/22/2014 11:41 AM

## 2014-08-22 NOTE — Progress Notes (Signed)
Occupational Therapy Treatment Patient Details Name: [name] [name] MRN: [id number] DOB: 02-06-1933 Today's Date: 08/22/2014    History of present illness 80 yo male admitted with syncope with collapse, hypoxia. Hx of DM, NHL, lung cancer, AAA, aortic stenosis, MRSA, A fib, HTN, COPD. Recent d/c from hospital on 10/6 and SNF on 10/16 per chart.    OT comments  Pt. Progressing well with acute OT goals.  Cues for slowing pace and incorporating energy conservation techniques during tasks.  Recovery from coughing/sneezing takes some time. But pt. Able to resume task afterwards.    Follow Up Recommendations  Home health OT    Equipment Recommendations  3 in 1 bedside comode          Precautions / Restrictions Precautions Precautions: Fall Precaution Comments: monitor 02 sats Restrictions Weight Bearing Restrictions: No       Mobility Bed Mobility               General bed mobility comments: pt sitting EOB upon arrival  Transfers Overall transfer level: Needs assistance Equipment used: Rolling walker (2 wheeled) Transfers: Sit to/from [facility] Sit to Stand: Min guard         General transfer comment: cues for safety/sequencing and slowing pace                                       ADL Overall ADL's : Needs assistance/impaired     Grooming: Wash/dry hands;Wash/dry face;Set up;Sitting               Lower Body Dressing: Supervision/safety;Min guard;Sitting/lateral leans;Sit to/from stand   Toilet Transfer: Min guard;Stand-pivot;Ambulation;RW Toilet Transfer Details (indicate cue type and reason): cues for energy conservation, slowing pace and managing o2 tubing during transfer Toileting- Clothing Manipulation and Hygiene: Min guard;Sit to/from stand       Functional mobility during ADLs: Min guard General ADL Comments: simulated task of gathering clothes with managing bed linens and blankets, Min guard mostly for slowing  pace and initiating rest breaks as needed.  pt. limited moslty from cough/sneezing that takes some time to recover from                                      Cognition   Behavior During Therapy: [facility] for tasks assessed/performed Overall Cognitive Status: Within Functional Limits for tasks assessed                                                        Pertinent Vitals/ Pain       Pain Assessment: No/denies pain                                                          Frequency Min 2X/week     Progress Toward Goals  OT Goals(current goals can now be found in the care plan section)  Progress towards OT goals: Progressing toward goals     Plan Discharge plan remains appropriate  End of Session Equipment Utilized During Treatment: Gait belt;Rolling walker;Oxygen   Activity Tolerance Patient tolerated treatment well   Patient Left in chair;with call bell/phone within reach             Time: 0850-0914 OT Time Calculation (min): 24 min  Charges: OT General Charges $OT Visit: 1 Procedure OT Treatments $Self Care/Home Management : 23-37 mins  Janice Coffin, COTA/L 08/22/2014, 9:16 AM

## 2014-08-23 ENCOUNTER — Telehealth: Payer: Self-pay | Admitting: Nurse Practitioner

## 2014-08-23 ENCOUNTER — Other Ambulatory Visit: Payer: Self-pay | Admitting: *Deleted

## 2014-08-23 DIAGNOSIS — F329 Major depressive disorder, single episode, unspecified: Secondary | ICD-10-CM

## 2014-08-23 LAB — BASIC METABOLIC PANEL
Anion gap: 11 (ref 5–15)
BUN: 17 mg/dL (ref 6–23)
CALCIUM: 8.1 mg/dL — AB (ref 8.4–10.5)
CO2: 32 mEq/L (ref 19–32)
CREATININE: 0.87 mg/dL (ref 0.50–1.35)
Chloride: 93 mEq/L — ABNORMAL LOW (ref 96–112)
GFR calc Af Amer: >90 mL/min (ref >90)
GFR, EST NON AFRICAN AMERICAN: 80 mL/min — AB (ref >90)
GLUCOSE: 107 mg/dL — AB (ref 70–99)
Potassium: 4.1 mEq/L (ref 3.7–5.3)
Sodium: 136 mEq/L — ABNORMAL LOW (ref 137–147)

## 2014-08-23 LAB — GLUCOSE, CAPILLARY
GLUCOSE-CAPILLARY: 226 mg/dL — AB (ref 70–99)
Glucose-Capillary: 150 mg/dL — ABNORMAL HIGH (ref 70–99)
Glucose-Capillary: 98 mg/dL (ref 70–99)

## 2014-08-23 MED ORDER — INSULIN GLARGINE 100 UNIT/ML ~~LOC~~ SOLN: 18.0000 [IU] | Freq: Every day | SUBCUTANEOUS | Status: AC

## 2014-08-23 MED ORDER — GLUCERNA SHAKE PO LIQD: 237.0000 mL | ORAL | Status: AC

## 2014-08-23 MED ORDER — HEPARIN SOD (PORK) LOCK FLUSH 100 UNIT/ML IV SOLN: 500.0000 [IU] | INTRAVENOUS | Status: DC | PRN

## 2014-08-23 NOTE — Discharge Summary (Signed)
Physician Discharge Summary  [name] [name] JKK:[id number] DOB: [birth year] DOA: 08/18/2014  PCP: [name], MD  Admit date: 08/18/2014 Discharge date: 08/23/2014  Time spent: >30 minutes  Recommendations for Outpatient Follow-up:  Check BMET to follow electrolytes and renal function Reassess BP and follow volume status  Discharge Diagnoses:  Syncope Acute on chronic respiratory failure Acute on chronic systolic heart failure BPH Lung Cancer (left) Malignant pleural effusion  Discharge Condition: stable and improved. Will discharge home with [facility] services ([facility]).  Diet recommendation: low sodium diet and low carb diet  Filed Weights   08/21/14 0441 08/22/14 0505 08/23/14 0556  Weight: 88.179 kg (194 lb 6.4 oz) 89.721 kg (197 lb 12.8 oz) 87.8 kg (193 lb 9 oz)    History of present illness:  79 y.o. male With a history of combined systolic and diastolic heart failure, diabetes mellitus, hypertension, lung cancer, that presented to the emergency department with complaints of passing out as well as increased shortness of breath. Patient passed out earlier today while on his way to the doctor's appointment. His son then brought him into the emergency department. Patient denied any dizziness prior to his syncopal episode. Patient currently denies any chest pain, abdominal pain, nausea, vomiting, ill contacts or recent travel. BNP elevated at 23K and CXR with vascular congestion/interstitial edema.   Hospital Course:  Acute on chronic respiratory failure  -Likely multifactorial including possible CHF exacerbation versus history of lung cancer and malignant pleural effusion  -Patient was hypoxic to 82% upon admission. Now 98-100%  -Patient currently at his baseline, requiring 4 L of oxygen via nasal cannula  -will continue lasix 40mg  PO and metoprolol -follow electrolytes and renal function  -daily weight, strict I's and O's and low sodium diet  -will continue TED  hose (12 hours on and 12 hours off)   Acute combined systolic and diastolic heart failure  -Echocardiogram on 07/27/2014 shows an EF of 25-30% with a grade 2 diastolic dysfunction  -BNPto be repeated in am  -continue lasix PO now  -continue TED hoses  -vascular congestion and LE edema much improved/ probably at baseline  -dry weight 193-194 base on office notes; at discharge BNP down and weight 193 pounds -ACE/ARB not on board due to low BP   Left pleural effusion  -Chest x-ray showed a moderate left pleural effusion  -Will place patient on Lasix and continue to monitor  -repeated x-ray demonstrated persistent effusion and loculation; IR for thoracentesis consulted and 1.4L removed. Patient CXR with after procedure with just minimal effusion -no SOB and good O2 sat on chronic oxygen supplementation  -further re-accumulation will granted exploring option for pleurodex catneter.  Syncope  -Patient passed out earlier today, which was witnessed  -Patient had echocardiogram, results as above  -carotid Doppler w/o abnormalities to justify syncope  -Likely vasovagal or due to hypoxia  -will monitor   Normocytic anemia  -Patient currently at his baseline hemoglobin, continue to monitor CBC   Hypertension  -BP soft but stable again  -continue lasix 40mg  daily by mouth  -metoprolol 12.5mg  daily  Diabetes mellitus, type II with neuropathy  -Continue levemir (adjusted dose due to hypoglycemia) and tradjenta   -Continue gabapentin for neuropathy  -follow a low carb diet   Hyperlipidemia  -Continue statin   History of paroxysmal atrial fibrillation  -Continue amiodarone and metoprolol, patient is currently rate and rhythm control -not a candidate for anticoagulation   BPH  -Continue Flomax and cardura  Lung cancer,  left upper  -Patient finished chemotherapy in August 2015 and has also received radiation in the past  -Patient with followup with his oncologist upon  discharge  Physical deconditioning/weakness: will arrange for [facility] services including (HHPT, HHRN,   Procedures:  See below for x-ray reports  Carotid duplex:1-39% ICA stenosis; vertebral flow antegrade   Thoracentesis 10/26  ECHO: 9/30  - Left ventricle: The cavity size was normal. There was mild concentric hypertrophy. Systolic function was severely reduced. The estimated ejection fraction was in the range of 25% to 30%. Wall motion was normal; there were no regional wall motion abnormalities. Features are consistent with a pseudonormal left ventricular filling pattern, with concomitant abnormal relaxation and increased filling pressure (grade 2 diastolic dysfunction). Doppler parameters are consistent with elevated ventricular end-diastolic filling pressure. - Aortic valve: Trileaflet; severely thickened, moderately calcified leaflets. Cusp separation was moderately reduced. There was mild stenosis. Mean gradient (S): 11 mm Hg. Peak gradient (S): 18 mm Hg. Valve area (VTI): 1.56 cm^2. Valve area (Vmax): 1.57 cm^2. Valve area (Vmean): 1.36 cm^2. - Aortic root: The aortic root was normal in size. - Mitral valve: Calcified annulus. Mildly thickened leaflets . There was moderate regurgitation. - Left atrium: The atrium was moderately dilated. - Right ventricle: The cavity size was mildly dilated. Wall thickness was normal. Systolic function was mildly reduced. - Pulmonic valve: There was no regurgitation. - Pulmonary arteries: Systolic pressure was severely increased. PA peak pressure: 63 mm Hg (S). - Inferior vena cava: The vessel was dilated. The respirophasic diameter changes were blunted (< 50%), consistent with elevated central venous pressure. - Pericardium, extracardiac: There was a large left pleural effusion.  Consultations:  IR  Discharge Exam: Filed Vitals:   08/23/14 1422  BP: 86/47  Pulse: 72  Temp: 97.4 F (36.3 C)  Resp: 19   HEENT: No bruits, no  goiter. No JVD  Heart: no rubs or gallops; 1-2+ edema bilaterally  Lungs: positive rhonchi on left side, improved air movement otherwise  Abdomen: Soft, nontender, nondistended, positive bowel sounds.  Neuro: Grossly intact, nonfocal.   Discharge Instructions You were cared for by a hospitalist during your hospital stay. If you have any questions about your discharge medications or the care you received while you were in the hospital after you are discharged, you can call the unit and asked to speak with the hospitalist on call if the hospitalist that took care of you is not available. Once you are discharged, your primary care physician will handle any further medical issues. Please note that NO REFILLS for any discharge medications will be authorized once you are discharged, as it is imperative that you return to your primary care physician (or establish a relationship with a primary care physician if you do not have one) for your aftercare needs so that they can reassess your need for medications and monitor your lab values.  Discharge Instructions   Diet - low sodium heart healthy    Complete by:  As directed      Discharge instructions    Complete by:  As directed   Take medications as prescribed Follow with oncology (call office to set up appointment) Follow up with PCP in 5 days Follow a low sodium diet (less than 2 grams) Please check weight on daily basis -maker sure to use your TED hoses 12 hours on and then 12 hours off during night.          Current Discharge Medication List    START taking  these medications   Details  feeding supplement, GLUCERNA SHAKE, (GLUCERNA SHAKE) LIQD Take 237 mLs by mouth daily. Refills: 0      CONTINUE these medications which have CHANGED   Details  insulin glargine (LANTUS) 100 UNIT/ML injection Inject 0.18 mLs (18 Units total) into the skin at bedtime.      CONTINUE these medications which have NOT CHANGED   Details  amiodarone  (PACERONE) 200 MG tablet Take 200 mg by mouth 2 (two) times daily.    aspirin 81 MG tablet Take 81 mg by mouth 2 (two) times daily.     atorvastatin (LIPITOR) 40 MG tablet Take 40 mg by mouth daily.    citalopram (CELEXA) 20 MG tablet Take 20 mg by mouth daily.    finasteride (PROSCAR) 5 MG tablet Take 5 mg by mouth daily.    furosemide (LASIX) 40 MG tablet Take 40 mg by mouth daily.    gabapentin (NEURONTIN) 600 MG tablet Take 600 mg by mouth 2 (two) times daily.     Glucosamine-Chondroit-Vit C-Mn (GLUCOSAMINE CHONDR 1500 COMPLX) CAPS Take 1 capsule by mouth daily.    insulin aspart (NOVOLOG) 100 UNIT/ML injection Inject 5 Units into the skin 2 (two) times daily.    linagliptin (TRADJENTA) 5 MG TABS tablet Take 1 tablet (5 mg total) by mouth daily. With largest meal Qty: 30 tablet, Refills: 3   Associated Diagnoses: Type 2 diabetes mellitus with neurological manifestations, uncontrolled    metoprolol tartrate (LOPRESSOR) 25 MG tablet Take 12.5 mg by mouth daily.    oxyCODONE-acetaminophen (PERCOCET/ROXICET) 5-325 MG per tablet Take one tablet by mouth every 4 hours as needed for pain Qty: 180 tablet, Refills: 0    pantoprazole (PROTONIX) 40 MG tablet Take 40 mg by mouth every evening.    potassium chloride 20 MEQ TBCR Take 40 mEq by mouth daily. Qty: 30 tablet, Refills: 3   Associated Diagnoses: Acute on chronic diastolic CHF (congestive heart failure), NYHA class 2    sucralfate (CARAFATE) 1 G tablet Take 1 tablet (1 g total) by mouth 4 (four) times daily. Qty: 30 tablet, Refills: 0    Tamsulosin HCl (FLOMAX) 0.4 MG CAPS Take 0.4 mg by mouth daily.       No Known Allergies Follow-up Information   Follow up with [facility]. (HHRN/HHPT/HH NURSE'S AIDE.)    Specialty:  [facility] information:   [address] [place] [address] [phone]       Follow up with [name], MD On 08/26/2014. (follow as instructed in approx 5  days)    Specialty:  Internal Medicine   Contact information:   [address] [place] [address] [phone]       Follow up with Betsy Coder, MD. (call office to set up appointment)    Specialty:  Oncology   Contact information:   [place] [address] [phone]       The results of significant diagnostics from this hospitalization (including imaging, microbiology, ancillary and laboratory) are listed below for reference.    Significant Diagnostic Studies: Dg Chest 1 View  08/22/2014   CLINICAL DATA:  Post LEFT thoracentesis, LEFT pleural effusion, personal history of diabetes mellitus, coronary artery disease, hypertension, non-Hodgkin's lymphoma, COPD, MI, lung cancer  EXAM: CHEST - 1 VIEW  COMPARISON:  08/21/2014  FINDINGS: RIGHT jugular Port-A-Cath stable tip projecting over SVC.  Enlargement of cardiac silhouette with mitral annular calcification.  Atherosclerotic calcification aorta.  Mediastinal  contour stable.  BILATERAL pulmonary infiltrates greatest in LEFT upper lobe.  Decreased LEFT pleural effusion post thoracentesis.  No pneumothorax.  Minimal bibasilar atelectasis.  Diffuse osseous demineralization.  IMPRESSION: No pneumothorax following LEFT thoracentesis.  Persistent pulmonary infiltrates greatest in LEFT upper lobe; underlying mass/abnormalities in LEFT upper lobe not excluded with this appearance.  Decreased LEFT pleural effusion and minimal bibasilar atelectasis.  Enlargement of cardiac silhouette.   Electronically Signed   By: Lavonia Dana M.D.   On: 08/22/2014 12:11   Dg Chest 1 View  08/02/2014   CLINICAL DATA:  Status post left-sided thoracentesis  EXAM: CHEST - 1 VIEW  COMPARISON:  July 30, 2014  FINDINGS: Left effusion is smaller post thoracentesis. There is no demonstrable pneumothorax. The superior left hilar mass with volume loss the left remain stable. There is underlying emphysema. There is mild scarring on the right. There is  no new lung opacity. Heart is mildly enlarged with pulmonary vascularity within normal limits. There is calcification in the mitral annulus. Port-A-Cath tip is in the superior vena cava.  IMPRESSION: Left effusion smaller post thoracentesis. No pneumothorax. Areas of lung scarring with underlying emphysema stable. The mass in the superior left hilar region with associated volume loss is grossly stable.   Electronically Signed   By: Lowella Grip M.D.   On: 08/02/2014 09:17   Dg Chest 2 View  08/21/2014   CLINICAL DATA:  80 year old male with shortness of breath. History of lung cancer.  EXAM: CHEST  2 VIEW  COMPARISON:  Chest x-ray 08/18/2014.  FINDINGS: Moderate left pleural effusion, potentially a partially loculated given the unusual appearance at the left lung base. Trace right pleural effusion. Cephalization of the pulmonary vasculature with indistinct interstitial markings and diffuse peribronchial cuffing. Mass like opacity in the left hilar and suprahilar region with extensive interstitial opacities throughout the left upper lung, compatible with known of mass with probable postradiation changes. Cardiac silhouette is largely obscured, but appears enlarged. Upper mediastinal contours are partially obscured. Atherosclerosis in the thoracic aorta. Right internal jugular single-lumen power porta cath with tip terminating at the superior cavoatrial junction.  IMPRESSION: 1. Appearance the chest suggests mild congestive failure. 2. Partially loculated moderate left pleural effusion. 3. Known left upper lobe mass with probable surrounding postradiation changes in the left upper lobe. However, the possibility of pneumonia in the left upper lobe is not excluded, and clinical correlation is recommended.   Electronically Signed   By: Vinnie Langton M.D.   On: 08/21/2014 08:56   Dg Chest 2 View  08/18/2014   CLINICAL DATA:  Shortness of breath, left upper lobe lung cancer  EXAM: CHEST  2 VIEW  COMPARISON:   08/02/2014  FINDINGS: There is a moderate left pleural effusion. There is left upper lobe post treatment changes. There are increased interstitial markings in the right lung left upper lobe. Stable cardiomediastinal silhouette. Right-sided Port-A-Cath with the tip projecting over the SVC. Unremarkable osseous structures.  IMPRESSION: 1. Moderate left pleural effusion. 2. Bilateral interstitial prominence likely reflecting an element of chronic interstitial lung disease with superimposed mild interstitial edema.   Electronically Signed   By: Kathreen Devoid   On: 08/18/2014 15:27   Dg Chest 2 View  07/26/2014   CLINICAL DATA:  Difficulty breathing; history of lung carcinoma  EXAM: CHEST  2 VIEW  COMPARISON:  July 23, 2014 chest radiograph and chest CT July 11, 2004  FINDINGS: Port-A-Cath tip is in the superior vena cava. No pneumothorax. The previously  noted left upper lobe mass with volume loss persists without appreciable change. There is a moderate effusion on the left. Elsewhere there is generalized interstitial edema. Masses in the right lower lobe seen on recent CT are not well seen by radiography. The heart size is normal. Pulmonary vascularity is within normal limits. There is atherosclerotic change in the aorta. No bone lesions.  IMPRESSION: Persistent left effusion and generalized interstitial edema. Left upper lobe mass with volume loss persists. Masses in the right lower lobe seen on recent CT are not well seen by radiography. No change in cardiac silhouette. No pneumothorax.   Electronically Signed   By: Lowella Grip M.D.   On: 07/26/2014 09:01   Dg Chest Port 1 View  07/30/2014   CLINICAL DATA:  Pleural effusion.  EXAM: PORTABLE CHEST - 1 VIEW  COMPARISON:  07/26/2014  FINDINGS: Pop or type central venous catheter with tip over the low SVC region. No change in position. Shallow inspiration. Cardiac enlargement with pulmonary vascular congestion and interstitial edema bilaterally.  Calcification in the mitral valve annulus. Calcified and tortuous aorta. Left hilar mass with atelectasis in the left upper lung. Moderate size left pleural effusion with basilar atelectasis. No significant change since previous study.  IMPRESSION: Cardiac enlargement with pulmonary vascular congestion and edema. Left hilar mass with atelectasis in the left upper lung. Left pleural effusion with basilar atelectasis. No significant change.   Electronically Signed   By: Lucienne Capers M.D.   On: 07/30/2014 21:22   US Thoracentesis Asp Pleural Space W/img Guide  08/22/2014   CLINICAL DATA:  Shortness of breath, recurrent left pleural effusion. Request therapeutic thoracentesis.  EXAM: ULTRASOUND GUIDED LEFT THORACENTESIS  COMPARISON:  Previous thoracentesis  PROCEDURE: An ultrasound guided thoracentesis was thoroughly discussed with the patient and questions answered. The benefits, risks, alternatives and complications were also discussed. The patient understands and wishes to proceed with the procedure. Written consent was obtained.  Ultrasound was performed to localize and mark an adequate pocket of fluid in the left chest. The area was then prepped and draped in the normal sterile fashion. 1% Lidocaine was used for local anesthesia. Under ultrasound guidance a 6 French Safe-T-Centesis catheter was introduced. Thoracentesis was performed. The catheter was removed and a dressing applied.  COMPLICATIONS: None  FINDINGS: A total of approximately 1.4 L of clear, dark yellow fluid was removed. A fluid sample was notsent for laboratory analysis.  IMPRESSION: Successful ultrasound guided left thoracentesis yielding 1.4 L of pleural fluid.  Read by: Ascencion Dike PA-C   Electronically Signed   By: Aletta Edouard M.D.   On: 08/22/2014 11:48   US Thoracentesis Asp Pleural Space W/img Guide  08/02/2014   CLINICAL DATA:  Shortness of breath, left-sided pleural effusion. Request diagnostic and therapeutic thoracentesis.   EXAM: ULTRASOUND GUIDED LEFT THORACENTESIS  COMPARISON:  None.  PROCEDURE: An ultrasound guided thoracentesis was thoroughly discussed with the patient and questions answered. The benefits, risks, alternatives and complications were also discussed. The patient understands and wishes to proceed with the procedure. Written consent was obtained.  Ultrasound was performed to localize and mark an adequate pocket of fluid in the left chest. The area was then prepped and draped in the normal sterile fashion. 1% Lidocaine was used for local anesthesia. Under ultrasound guidance a 6 French Safe-T-Centesis catheter was introduced. Thoracentesis was performed. The catheter was removed and a dressing applied.  Complications:  None immediate  FINDINGS: A total of approximately 1.2 L of dark yellow fluid  was removed. A fluid sample wassent for laboratory analysis.  IMPRESSION: Successful ultrasound guided left thoracentesis yielding 1.2 L of pleural fluid.  Read by: Ascencion Dike PA-C   Electronically Signed   By: Jacqulynn Cadet M.D.   On: 08/02/2014 09:36    Microbiology: Recent Results (from the past 240 hour(s))  MRSA PCR SCREENING     Status: None   Collection Time    08/18/14  7:03 PM      Result Value Ref Range Status   MRSA by PCR NEGATIVE  NEGATIVE Final   Comment:            The GeneXpert MRSA Assay (FDA     approved for NASAL specimens     only), is one component of a     comprehensive MRSA colonization     surveillance program. It is not     intended to diagnose MRSA     infection nor to guide or     monitor treatment for     MRSA infections.     Performed at [facility]: Basic Metabolic Panel:  Recent Labs Lab 08/19/14 0415 08/20/14 0850 08/21/14 0450 08/22/14 0535 08/23/14 0430  NA 142 137 135* 134* 136*  K 3.6* 3.7 4.2 4.1 4.1  CL 100 95* 91* 92* 93*  CO2 29 30 35* 33* 32  GLUCOSE 43* 104* 128* 107* 107*  BUN 21 18 17 16 17   CREATININE 1.12 0.99 1.12 0.90  0.87  CALCIUM 8.3* 8.2* 8.1* 8.0* 8.1*   CBC:  Recent Labs Lab 08/18/14 1419 08/19/14 0415  WBC 5.4 5.6  HGB 9.3* 9.1*  HCT 29.2* 28.6*  MCV 93.6 93.2  PLT 154 149*   BNP (last 3 results)  Recent Labs  07/23/14 0955 08/18/14 1512 08/22/14 0535  PROBNP 21437.0* 22975.0* 11824.0*   CBG:  Recent Labs Lab 08/22/14 1215 08/22/14 1651 08/22/14 2041 08/23/14 0832 08/23/14 1159  GLUCAP 150* 235* 150* 98 226*    Signed:  Barton Dubois  [facility] 08/23/2014, 3:32 PM

## 2014-08-23 NOTE — Progress Notes (Signed)
Physical Therapy Treatment Patient Details Name: [name] [name] MRN: [id number] DOB: 05-10-1933 Today's Date: 08/23/2014    History of Present Illness      PT Comments    Patient was seen at [facility] on 4L of RA; O2 sats were 99%.  Tx included gait in the hallway on 4L portable O2; patient required standing rest breaks x2 in order for O2 sats to improve; range 78%-88% during gait.  Patient was educated on energy conservation, importance of rest breaks, and pursed lip breathing.  Patient had not c/o increased pain during tx.    Follow Up Recommendations  Home health PT     Equipment Recommendations       Recommendations for Other Services       Precautions / Restrictions      Mobility  Bed Mobility                  Transfers Overall transfer level: Modified independent   Transfers: Sit to/from Stand Sit to Stand: Modified independent (Device/Increase time)         General transfer comment: Patient used bed/surface for UE support for pushing off.  Ambulation/Gait Ambulation/Gait assistance: Supervision;Min guard Ambulation Distance (Feet): 100 Feet Assistive device: None Gait Pattern/deviations: Step-through pattern;WFL(Within Functional Limits)     General Gait Details: Patient required standing rest breaks x2 in order to control O2 sats; O2 sats ranged from 78-99; patient was educated on energy conservation, rest breaks, and pursed lip breathing   Stairs            Wheelchair Mobility    Modified Rankin (Stroke Patients Only)       Balance                                    Cognition                            Exercises      General Comments        Pertinent Vitals/Pain Pain Assessment: No/denies pain    Home Living                      Prior Function            PT Goals (current goals can now be found in the care plan section) Progress towards PT goals: Progressing toward goals     Frequency  Min 3X/week    PT Plan Current plan remains appropriate    Co-evaluation             End of Session Equipment Utilized During Treatment: Gait belt;Oxygen Activity Tolerance: Patient tolerated treatment well Patient left: in chair     Time:  -     Charges:                       G Codes:      Miller,Derrick PTA student 08/23/2014, 12:51 PM  I reviewed the above Rica Koyanagi  PTA [facility]  Acute  Rehab Pager      [phone]

## 2014-08-23 NOTE — Telephone Encounter (Signed)
LM to confirm d/t for Nov. Mailed Cal.

## 2014-09-06 ENCOUNTER — Ambulatory Visit (HOSPITAL_COMMUNITY)
Admission: RE | Admit: 2014-09-06 | Discharge: 2014-09-06 | Disposition: A | Discharge status: Home / Self Care | Payer: Medicare Other | Source: Ambulatory Visit | Attending: Nurse Practitioner | Admitting: Nurse Practitioner

## 2014-09-06 ENCOUNTER — Ambulatory Visit (HOSPITAL_BASED_OUTPATIENT_CLINIC_OR_DEPARTMENT_OTHER): Payer: Medicare Other | Admitting: Nurse Practitioner

## 2014-09-06 ENCOUNTER — Telehealth: Payer: Self-pay | Admitting: Oncology

## 2014-09-06 ENCOUNTER — Other Ambulatory Visit: Payer: Medicare Other

## 2014-09-06 VITALS — BP 83/53 | HR 102 | Temp 97.5°F | Resp 17 | Wt 195.1 lb

## 2014-09-06 DIAGNOSIS — E119 Type 2 diabetes mellitus without complications: Secondary | ICD-10-CM

## 2014-09-06 DIAGNOSIS — C3492 Malignant neoplasm of unspecified part of left bronchus or lung: Secondary | ICD-10-CM

## 2014-09-06 DIAGNOSIS — G629 Polyneuropathy, unspecified: Secondary | ICD-10-CM

## 2014-09-06 DIAGNOSIS — N189 Chronic kidney disease, unspecified: Secondary | ICD-10-CM

## 2014-09-06 DIAGNOSIS — Z8572 Personal history of non-Hodgkin lymphomas: Secondary | ICD-10-CM

## 2014-09-06 DIAGNOSIS — R0789 Other chest pain: Secondary | ICD-10-CM | POA: Insufficient documentation

## 2014-09-06 DIAGNOSIS — R0602 Shortness of breath: Secondary | ICD-10-CM | POA: Diagnosis not present

## 2014-09-06 DIAGNOSIS — Z85118 Personal history of other malignant neoplasm of bronchus and lung: Secondary | ICD-10-CM | POA: Diagnosis not present

## 2014-09-06 DIAGNOSIS — J90 Pleural effusion, not elsewhere classified: Secondary | ICD-10-CM

## 2014-09-06 DIAGNOSIS — I4891 Unspecified atrial fibrillation: Secondary | ICD-10-CM

## 2014-09-06 DIAGNOSIS — J449 Chronic obstructive pulmonary disease, unspecified: Secondary | ICD-10-CM

## 2014-09-06 DIAGNOSIS — C3412 Malignant neoplasm of upper lobe, left bronchus or lung: Secondary | ICD-10-CM

## 2014-09-06 NOTE — Telephone Encounter (Signed)
gv and printed appt sched and avs for pt for NOV...sent pt to radiology

## 2014-09-06 NOTE — Progress Notes (Addendum)
  [facility] OFFICE PROGRESS NOTE   Diagnosis:  Lung cancer  INTERVAL HISTORY:   [name] [name] returns for followup. He was most recently hospitalized 08/18/2014 through 08/23/2014 with CHF. He has continued dyspnea on exertion. He utilizes supplemental oxygen at 3 L per minute. No cough. He denies fever. He reports a good appetite. Energy level is poor. He has persistent hoarseness.  Objective:  Vital signs in last 24 hours:  Blood pressure 83/53, pulse 102, temperature 97.5 F (36.4 C), temperature source Oral, resp. rate 17, weight 195 lb 1.6 oz (88.497 kg), SpO2 93 %.    HEENT: white coating over tongue. Resp: breath sounds diminished at the left lung base. No respiratory distress. Cardio: regular rate and rhythm. GI: abdomen soft and nontender. No hepatomegaly. Vascular: trace lower leg edema bilaterally.  Lab Results:  Lab Results  Component Value Date   WBC 5.6 08/19/2014   HGB 9.1* 08/19/2014   HCT 28.6* 08/19/2014   MCV 93.2 08/19/2014   PLT 149* 08/19/2014   NEUTROABS 2.2 07/23/2014    Imaging:  No results found.  Medications: I have reviewed the patient's current medications.  Assessment/Plan: 1.Non-small cell lung cancer-left upper lobe squamous cell carcinoma   CT chest 03/14/2014 consistent with a left upper lobe mass, left hilar lymphadenopathy, and left upper lobe atelectasis, staging PET scan pending.   Initiation of chest radiation 04/05/2014. Radiation completed 05/30/2014.   Initiation of weekly Taxol/carboplatin chemotherapy 04/18/2014. Final weekly Taxol/carboplatin given 05/30/2014.  Restaging CT 07/11/2014 with a decrease in the left upper lobe mass, new bilateral pleural effusions 2. Admission 04/10/2014 with acute CHF and acute coronary syndrome, status post a cardiac catheterization 04/10/2014, clinical improvement with medical therapy.  3. COPD  4. Chronic renal failure  5. Diabetes  6. Neuropathy  7. History of  non-Hodgkin's lymphoma  8. Atrial fibrillation.  9. Status post evaluation in the emergency department 05/13/2014 for chest pain. Question radiation esophagitis.  10. Oral candidiasis. He completed a 5 day course of Diflucan.  11. History of thrombocytopenia secondary to chemotherapy. The carboplatin was dose reduced on 05/30/2014.  12. 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 13. Hospitalization 07/23/2014 through 08/02/2014 with CHF. 14. Hospitalization 08/18/2014 through 08/23/2014 with CHF. 15. Bilateral pleural effusions on chest CT 07/11/2014. Status post thoracentesis 08/02/2014 and 08/22/2014. It does not appear fluid was sent for cytology.   Disposition: [name] [name] continues to recover from the recent hospitalizations. We will obtain a chest x-ray today to followup the pleural effusions. We scheduled a return visit in approximately 2 weeks. He will contact the office in the interim with any problems.  Patient seen with Dr. Benay Spice.    Ned Card ANP/GNP-BC   09/06/2014  4:41 PM  This was a shared visit with Ned Card. We will consider PD1 directed therapy if there is progression of the non-small cell lung cancer.  Julieanne Manson, M.D.

## 2014-09-08 ENCOUNTER — Telehealth: Payer: Self-pay | Admitting: Oncology

## 2014-09-08 ENCOUNTER — Telehealth: Payer: Self-pay | Admitting: *Deleted

## 2014-09-08 NOTE — Telephone Encounter (Signed)
Pt's son Nicole Kindred returned call, pt wants to hold off on thoracentesis for now. Instructed them to call office for persistent dyspnea. He voiced understanding.  Dr. Benay Spice plans to send fluid for cytology if thoracentesis is done.

## 2014-09-08 NOTE — Telephone Encounter (Signed)
, °

## 2014-09-08 NOTE — Telephone Encounter (Signed)
Called and informed patient's son Nicole Kindred) that patient has increased left effusion and to call for dyspnea. Informed patient's son that chest xray has been ordered for 11/27 and scheduler's should be calling with that appointment. Per Elby Showers. Marcello Moores, NP.  Patient's son verbalized understanding and stated he would inform his father.

## 2014-09-08 NOTE — Telephone Encounter (Signed)
-----   Message from [name] Pier, MD sent at 09/07/2014  5:41 PM EST ----- Please call patient, increased left effusion, call for dyspnea, , f/u 11/27 , add cxr that day

## 2014-09-08 NOTE — Telephone Encounter (Signed)
Call from pt's son reporting dyspnea is worsening. Took him to the bank and he had to sit down when he walked in. Seems to be doing a little better since getting him home. Asks if he should go to the ED. Reviewed with Dr. Benay Spice: Effusion was slightly bigger, will send for therapuetic thoracentesis if dyspnea is persistent. Kalman Shan, he will discuss with pt.

## 2014-09-10 ENCOUNTER — Emergency Department (HOSPITAL_COMMUNITY): Payer: Medicare Other

## 2014-09-10 ENCOUNTER — Inpatient Hospital Stay (HOSPITAL_COMMUNITY)
Admission: EM | Admit: 2014-09-10 | Discharge: 2014-09-12 | DRG: 292 | Disposition: A | Discharge status: Home Health | Payer: Medicare Other | Attending: Internal Medicine | Admitting: Internal Medicine

## 2014-09-10 ENCOUNTER — Encounter (HOSPITAL_COMMUNITY): Payer: Self-pay | Admitting: Emergency Medicine

## 2014-09-10 ENCOUNTER — Inpatient Hospital Stay (HOSPITAL_COMMUNITY): Payer: Medicare Other

## 2014-09-10 DIAGNOSIS — Z823 Family history of stroke: Secondary | ICD-10-CM

## 2014-09-10 DIAGNOSIS — J449 Chronic obstructive pulmonary disease, unspecified: Secondary | ICD-10-CM | POA: Diagnosis present

## 2014-09-10 DIAGNOSIS — J42 Unspecified chronic bronchitis: Secondary | ICD-10-CM

## 2014-09-10 DIAGNOSIS — Z800 Family history of malignant neoplasm of digestive organs: Secondary | ICD-10-CM

## 2014-09-10 DIAGNOSIS — I252 Old myocardial infarction: Secondary | ICD-10-CM | POA: Diagnosis not present

## 2014-09-10 DIAGNOSIS — Z8249 Family history of ischemic heart disease and other diseases of the circulatory system: Secondary | ICD-10-CM

## 2014-09-10 DIAGNOSIS — E440 Moderate protein-calorie malnutrition: Secondary | ICD-10-CM | POA: Diagnosis present

## 2014-09-10 DIAGNOSIS — I5043 Acute on chronic combined systolic (congestive) and diastolic (congestive) heart failure: Secondary | ICD-10-CM | POA: Diagnosis present

## 2014-09-10 DIAGNOSIS — I5030 Unspecified diastolic (congestive) heart failure: Secondary | ICD-10-CM | POA: Diagnosis present

## 2014-09-10 DIAGNOSIS — I10 Essential (primary) hypertension: Secondary | ICD-10-CM | POA: Diagnosis present

## 2014-09-10 DIAGNOSIS — Z79899 Other long term (current) drug therapy: Secondary | ICD-10-CM

## 2014-09-10 DIAGNOSIS — Z79891 Long term (current) use of opiate analgesic: Secondary | ICD-10-CM | POA: Diagnosis not present

## 2014-09-10 DIAGNOSIS — E119 Type 2 diabetes mellitus without complications: Secondary | ICD-10-CM | POA: Diagnosis present

## 2014-09-10 DIAGNOSIS — I2510 Atherosclerotic heart disease of native coronary artery without angina pectoris: Secondary | ICD-10-CM | POA: Diagnosis present

## 2014-09-10 DIAGNOSIS — Z87891 Personal history of nicotine dependence: Secondary | ICD-10-CM | POA: Diagnosis not present

## 2014-09-10 DIAGNOSIS — Z7982 Long term (current) use of aspirin: Secondary | ICD-10-CM

## 2014-09-10 DIAGNOSIS — I739 Peripheral vascular disease, unspecified: Secondary | ICD-10-CM | POA: Diagnosis present

## 2014-09-10 DIAGNOSIS — I480 Paroxysmal atrial fibrillation: Secondary | ICD-10-CM | POA: Diagnosis present

## 2014-09-10 DIAGNOSIS — E1149 Type 2 diabetes mellitus with other diabetic neurological complication: Secondary | ICD-10-CM

## 2014-09-10 DIAGNOSIS — J90 Pleural effusion, not elsewhere classified: Secondary | ICD-10-CM | POA: Diagnosis present

## 2014-09-10 DIAGNOSIS — Z8572 Personal history of non-Hodgkin lymphomas: Secondary | ICD-10-CM | POA: Diagnosis not present

## 2014-09-10 DIAGNOSIS — E785 Hyperlipidemia, unspecified: Secondary | ICD-10-CM | POA: Diagnosis present

## 2014-09-10 DIAGNOSIS — I350 Nonrheumatic aortic (valve) stenosis: Secondary | ICD-10-CM | POA: Diagnosis present

## 2014-09-10 DIAGNOSIS — Z9981 Dependence on supplemental oxygen: Secondary | ICD-10-CM

## 2014-09-10 DIAGNOSIS — R0602 Shortness of breath: Secondary | ICD-10-CM | POA: Diagnosis present

## 2014-09-10 DIAGNOSIS — Z794 Long term (current) use of insulin: Secondary | ICD-10-CM | POA: Diagnosis not present

## 2014-09-10 DIAGNOSIS — C3412 Malignant neoplasm of upper lobe, left bronchus or lung: Secondary | ICD-10-CM | POA: Diagnosis present

## 2014-09-10 DIAGNOSIS — I4892 Unspecified atrial flutter: Secondary | ICD-10-CM | POA: Diagnosis present

## 2014-09-10 DIAGNOSIS — C3410 Malignant neoplasm of upper lobe, unspecified bronchus or lung: Secondary | ICD-10-CM | POA: Diagnosis present

## 2014-09-10 LAB — CBC WITH DIFFERENTIAL/PLATELET
BASOS ABS: 0 10*3/uL (ref 0.0–0.1)
BASOS PCT: 0 % (ref 0–1)
Eosinophils Absolute: 0 10*3/uL (ref 0.0–0.7)
Eosinophils Relative: 1 % (ref 0–5)
HCT: 32 % — ABNORMAL LOW (ref 39.0–52.0)
Hemoglobin: 10.2 g/dL — ABNORMAL LOW (ref 13.0–17.0)
LYMPHS PCT: 21 % (ref 12–46)
Lymphs Abs: 1 10*3/uL (ref 0.7–4.0)
MCH: 29.2 pg (ref 26.0–34.0)
MCHC: 31.9 g/dL (ref 30.0–36.0)
MCV: 91.7 fL (ref 78.0–100.0)
Monocytes Absolute: 0.7 10*3/uL (ref 0.1–1.0)
Monocytes Relative: 15 % — ABNORMAL HIGH (ref 3–12)
NEUTROS ABS: 2.9 10*3/uL (ref 1.7–7.7)
NEUTROS PCT: 63 % (ref 43–77)
Platelets: 139 10*3/uL — ABNORMAL LOW (ref 150–400)
RBC: 3.49 MIL/uL — ABNORMAL LOW (ref 4.22–5.81)
RDW: 16.4 % — AB (ref 11.5–15.5)
WBC: 4.6 10*3/uL (ref 4.0–10.5)

## 2014-09-10 LAB — COMPREHENSIVE METABOLIC PANEL
ALK PHOS: 68 U/L (ref 39–117)
ALT: 10 U/L (ref 0–53)
AST: 17 U/L (ref 0–37)
Albumin: 3 g/dL — ABNORMAL LOW (ref 3.5–5.2)
Anion gap: 13 (ref 5–15)
BILIRUBIN TOTAL: 1 mg/dL (ref 0.3–1.2)
BUN: 16 mg/dL (ref 6–23)
CHLORIDE: 104 meq/L (ref 96–112)
CO2: 27 meq/L (ref 19–32)
Calcium: 8.9 mg/dL (ref 8.4–10.5)
Creatinine, Ser: 0.81 mg/dL (ref 0.50–1.35)
GFR calc Af Amer: >90 mL/min (ref >90)
GFR calc non Af Amer: 82 mL/min — ABNORMAL LOW (ref >90)
Glucose, Bld: 174 mg/dL — ABNORMAL HIGH (ref 70–99)
Potassium: 3.7 mEq/L (ref 3.7–5.3)
Sodium: 144 mEq/L (ref 137–147)
Total Protein: 6.7 g/dL (ref 6.0–8.3)

## 2014-09-10 LAB — PRO B NATRIURETIC PEPTIDE: Pro B Natriuretic peptide (BNP): 11604 pg/mL — ABNORMAL HIGH (ref 0–450)

## 2014-09-10 LAB — TROPONIN I: Troponin I: <0.3 ng/mL (ref <0.30)

## 2014-09-10 MED ORDER — CITALOPRAM HYDROBROMIDE 20 MG PO TABS
20.0000 mg | ORAL_TABLET | Freq: Every day | ORAL | Status: DC
  Administered 2014-09-11 – 2014-09-12 (×2): 20 mg via ORAL
  Filled 2014-09-10 (×2): qty 1

## 2014-09-10 MED ORDER — CETYLPYRIDINIUM CHLORIDE 0.05 % MT LIQD
7.0000 mL | Freq: Two times a day (BID) | OROMUCOSAL | Status: DC
  Administered 2014-09-10 – 2014-09-12 (×4): 7 mL via OROMUCOSAL

## 2014-09-10 MED ORDER — ACETAMINOPHEN 325 MG PO TABS: 650.0000 mg | ORAL_TABLET | Freq: Four times a day (QID) | ORAL | Status: DC | PRN

## 2014-09-10 MED ORDER — ALBUTEROL SULFATE (2.5 MG/3ML) 0.083% IN NEBU: 2.5000 mg | INHALATION_SOLUTION | RESPIRATORY_TRACT | Status: DC | PRN

## 2014-09-10 MED ORDER — SODIUM CHLORIDE 0.9 % IJ SOLN
3.0000 mL | Freq: Two times a day (BID) | INTRAMUSCULAR | Status: DC
  Administered 2014-09-12: 3 mL via INTRAVENOUS

## 2014-09-10 MED ORDER — ATORVASTATIN CALCIUM 40 MG PO TABS
40.0000 mg | ORAL_TABLET | Freq: Every day | ORAL | Status: DC
  Administered 2014-09-11: 40 mg via ORAL
  Filled 2014-09-10 (×2): qty 1

## 2014-09-10 MED ORDER — SODIUM CHLORIDE 0.9 % IJ SOLN: 3.0000 mL | Freq: Two times a day (BID) | INTRAMUSCULAR | Status: DC

## 2014-09-10 MED ORDER — ACETAMINOPHEN 650 MG RE SUPP: 650.0000 mg | Freq: Four times a day (QID) | RECTAL | Status: DC | PRN

## 2014-09-10 MED ORDER — PANTOPRAZOLE SODIUM 40 MG PO TBEC
40.0000 mg | DELAYED_RELEASE_TABLET | Freq: Every evening | ORAL | Status: DC
  Administered 2014-09-10 – 2014-09-11 (×2): 40 mg via ORAL
  Filled 2014-09-10 (×3): qty 1

## 2014-09-10 MED ORDER — PANTOPRAZOLE SODIUM 40 MG PO TBEC: 40.0000 mg | DELAYED_RELEASE_TABLET | Freq: Every day | ORAL | Status: DC

## 2014-09-10 MED ORDER — GABAPENTIN 300 MG PO CAPS
600.0000 mg | ORAL_CAPSULE | Freq: Two times a day (BID) | ORAL | Status: DC
  Administered 2014-09-10 – 2014-09-12 (×4): 600 mg via ORAL
  Filled 2014-09-10 (×4): qty 2

## 2014-09-10 MED ORDER — ONDANSETRON HCL 4 MG/2ML IJ SOLN: 4.0000 mg | Freq: Four times a day (QID) | INTRAMUSCULAR | Status: DC | PRN

## 2014-09-10 MED ORDER — IOHEXOL 350 MG/ML SOLN
100.0000 mL | Freq: Once | INTRAVENOUS | Status: AC | PRN
  Administered 2014-09-10: 100 mL via INTRAVENOUS

## 2014-09-10 MED ORDER — SODIUM CHLORIDE 0.9 % IV SOLN: 250.0000 mL | INTRAVENOUS | Status: DC | PRN

## 2014-09-10 MED ORDER — SUCRALFATE 1 G PO TABS
1.0000 g | ORAL_TABLET | Freq: Four times a day (QID) | ORAL | Status: DC
  Administered 2014-09-10 – 2014-09-12 (×6): 1 g via ORAL
  Filled 2014-09-10 (×8): qty 1

## 2014-09-10 MED ORDER — POTASSIUM CHLORIDE ER 10 MEQ PO TBCR
40.0000 meq | EXTENDED_RELEASE_TABLET | Freq: Every day | ORAL | Status: DC
  Administered 2014-09-11 – 2014-09-12 (×2): 40 meq via ORAL
  Filled 2014-09-10 (×2): qty 4

## 2014-09-10 MED ORDER — DOCUSATE SODIUM 100 MG PO CAPS
100.0000 mg | ORAL_CAPSULE | Freq: Two times a day (BID) | ORAL | Status: DC
  Administered 2014-09-10 – 2014-09-12 (×4): 100 mg via ORAL
  Filled 2014-09-10 (×4): qty 1

## 2014-09-10 MED ORDER — IPRATROPIUM BROMIDE 0.02 % IN SOLN: 0.5000 mg | Freq: Four times a day (QID) | RESPIRATORY_TRACT | Status: DC

## 2014-09-10 MED ORDER — SODIUM CHLORIDE 0.9 % IJ SOLN: 3.0000 mL | INTRAMUSCULAR | Status: DC | PRN

## 2014-09-10 MED ORDER — FUROSEMIDE 40 MG PO TABS
40.0000 mg | ORAL_TABLET | Freq: Every day | ORAL | Status: DC
  Administered 2014-09-11 – 2014-09-12 (×2): 40 mg via ORAL
  Filled 2014-09-10 (×2): qty 1

## 2014-09-10 MED ORDER — ASPIRIN EC 81 MG PO TBEC
81.0000 mg | DELAYED_RELEASE_TABLET | Freq: Two times a day (BID) | ORAL | Status: DC
  Administered 2014-09-10 – 2014-09-12 (×4): 81 mg via ORAL
  Filled 2014-09-10 (×4): qty 1

## 2014-09-10 MED ORDER — INSULIN GLARGINE 100 UNIT/ML ~~LOC~~ SOLN
5.0000 [IU] | Freq: Every day | SUBCUTANEOUS | Status: DC
  Administered 2014-09-10 – 2014-09-11 (×2): 5 [IU] via SUBCUTANEOUS
  Filled 2014-09-10 (×3): qty 0.05

## 2014-09-10 MED ORDER — AMIODARONE HCL 200 MG PO TABS
200.0000 mg | ORAL_TABLET | Freq: Two times a day (BID) | ORAL | Status: DC
  Administered 2014-09-10 – 2014-09-12 (×4): 200 mg via ORAL
  Filled 2014-09-10 (×4): qty 1

## 2014-09-10 MED ORDER — HYDROCODONE-ACETAMINOPHEN 5-325 MG PO TABS
1.0000 | ORAL_TABLET | ORAL | Status: DC | PRN
  Administered 2014-09-10: 2 via ORAL
  Filled 2014-09-10: qty 2

## 2014-09-10 MED ORDER — FINASTERIDE 5 MG PO TABS
5.0000 mg | ORAL_TABLET | Freq: Every day | ORAL | Status: DC
  Administered 2014-09-10 – 2014-09-12 (×3): 5 mg via ORAL
  Filled 2014-09-10 (×3): qty 1

## 2014-09-10 MED ORDER — ONDANSETRON HCL 4 MG PO TABS: 4.0000 mg | ORAL_TABLET | Freq: Four times a day (QID) | ORAL | Status: DC | PRN

## 2014-09-10 MED ORDER — OXYCODONE-ACETAMINOPHEN 5-325 MG PO TABS: 1.0000 | ORAL_TABLET | Freq: Four times a day (QID) | ORAL | Status: DC | PRN

## 2014-09-10 MED ORDER — GUAIFENESIN ER 600 MG PO TB12
600.0000 mg | ORAL_TABLET | Freq: Two times a day (BID) | ORAL | Status: DC
  Administered 2014-09-10 – 2014-09-12 (×4): 600 mg via ORAL
  Filled 2014-09-10 (×4): qty 1

## 2014-09-10 MED ORDER — METOPROLOL TARTRATE 25 MG PO TABS
12.5000 mg | ORAL_TABLET | Freq: Every day | ORAL | Status: DC
  Administered 2014-09-10: 12.5 mg via ORAL
  Filled 2014-09-10 (×2): qty 1

## 2014-09-10 MED ORDER — INSULIN ASPART 100 UNIT/ML ~~LOC~~ SOLN
0.0000 [IU] | SUBCUTANEOUS | Status: DC
  Administered 2014-09-11: 1 [IU] via SUBCUTANEOUS
  Administered 2014-09-11 (×2): 3 [IU] via SUBCUTANEOUS
  Administered 2014-09-11 (×2): 1 [IU] via SUBCUTANEOUS
  Administered 2014-09-12: 2 [IU] via SUBCUTANEOUS
  Administered 2014-09-12: 1 [IU] via SUBCUTANEOUS
  Administered 2014-09-12 (×2): 2 [IU] via SUBCUTANEOUS

## 2014-09-10 MED ORDER — TAMSULOSIN HCL 0.4 MG PO CAPS
0.4000 mg | ORAL_CAPSULE | Freq: Every day | ORAL | Status: DC
  Administered 2014-09-10 – 2014-09-11 (×2): 0.4 mg via ORAL
  Filled 2014-09-10 (×2): qty 1

## 2014-09-10 NOTE — ED Notes (Signed)
[name] [name] to return call for pt to be transported to room

## 2014-09-10 NOTE — ED Provider Notes (Signed)
CSN: [id number]     Arrival date & time 09/10/14  1641 History   First MD Initiated Contact with Patient 09/10/14 1652     Chief Complaint  Patient presents with  . Shortness of Breath  . Chest Pain     (Consider location/radiation/quality/duration/timing/severity/associated sxs/prior Treatment) HPI Comments: Patient with PMH remarkable for diabetes, lipids, neuropathy, CAD, aneurysm, aortic stenosis, vascular disease, lung cancer with recent chemotherapy and radiation (Aug 3), atrial fibrillation presents to the ED with a chief complaint of SOB.  Patient was seen by Dr. Benay Spice from hem/onc recently, and was told that he has an increasing left pleural effusion.  He has had to have this drained in the past.  Patient states that he has noticed increasing SOB over the past 3 days.  He was instructed to come to the ED if it got "bad."  Patient states that this happened over the course of the day today.  He takes 3L home O2.  He denies fevers, chills, chest pain, nausea, vomiting, diarrhea, or constipation.  The history is provided by the patient. No language interpreter was used.    Past Medical History  Diagnosis Date  . Peripheral arterial disease     s/p Ao Bifem Bypass  . Diabetes mellitus without complication   . CAD (coronary artery disease)     By  CT Scan  . Hyperlipidemia   . Hypertension   . Non Hodgkin's lymphoma 2000, 2010  . Peripheral neuropathy   . COPD (chronic obstructive pulmonary disease)   . History of lower GI bleeding   . Substance abuse   . Lung cancer, upper lobe     left lung  . AAA (abdominal aortic aneurysm)   . MI (myocardial infarction) 04/10/14   Past Surgical History  Procedure Laterality Date  . Fracture surgery  2009    Left ankle  . Pr vein bypass graft,aorto-fem-pop  2005    Right common femoral to BK popliteal BPG  . Pr vein bypass graft,aorto-fem-pop  01-27-05    Revision of Right fem-pop BPG  . Cardiac catheterization  1990's  . Video  bronchoscopy Bilateral 03/14/2014    Procedure: VIDEO BRONCHOSCOPY WITH FLUORO;  Surgeon: Rigoberto Noel, MD;  Location: [facility];  Service: Cardiopulmonary;  Laterality: Bilateral;  . Nm myoview ltd  11/2013    Lexiscan.  EF 60%, small fixed inferoapical defect - ? artifact  . Transthoracic echocardiogram  11/2013    EF 60-65%, Gr 1 DD, Mod AS, Sever MAC - no MS.      Family History  Problem Relation Age of Onset  . Cancer Mother     Colon  . Heart attack Mother   . Stroke Father   . Hyperlipidemia Father   . Hyperlipidemia Sister   . Hyperlipidemia Brother   . Hyperlipidemia Daughter   . Hypertension Son    History  Substance Use Topics  . Smoking status: Former Smoker -- 1.00 packs/day for 50 years    Types: Cigarettes    Quit date: 10/29/2003  . Smokeless tobacco: Never Used  . Alcohol Use: No    Review of Systems  Constitutional: Negative for fever and chills.  Respiratory: Positive for shortness of breath.   Cardiovascular: Negative for chest pain.  Gastrointestinal: Negative for nausea, vomiting, diarrhea and constipation.  Genitourinary: Negative for dysuria.  All other systems reviewed and are negative.     Allergies  Review of patient's allergies indicates no known allergies.  Home Medications  Prior to Admission medications   Medication Sig Start Date End Date Taking? Authorizing Provider  amiodarone (PACERONE) 200 MG tablet Take 200 mg by mouth 2 (two) times daily.    Historical Provider, MD  aspirin 81 MG tablet Take 81 mg by mouth 2 (two) times daily.     Historical Provider, MD  atorvastatin (LIPITOR) 40 MG tablet Take 40 mg by mouth daily.    Historical Provider, MD  citalopram (CELEXA) 20 MG tablet Take 20 mg by mouth daily.    Historical Provider, MD  feeding supplement, GLUCERNA SHAKE, (GLUCERNA SHAKE) LIQD Take 237 mLs by mouth daily. 08/23/14   Barton Dubois, MD  finasteride (PROSCAR) 5 MG tablet Take 5 mg by mouth daily.    Historical  Provider, MD  furosemide (LASIX) 40 MG tablet Take 40 mg by mouth daily.    Historical Provider, MD  gabapentin (NEURONTIN) 600 MG tablet Take 600 mg by mouth 2 (two) times daily.     Historical Provider, MD  Glucosamine-Chondroit-Vit C-Mn (GLUCOSAMINE CHONDR 1500 COMPLX) CAPS Take 1 capsule by mouth daily.    Historical Provider, MD  insulin aspart (NOVOLOG) 100 UNIT/ML injection Inject 5 Units into the skin 2 (two) times daily.    Historical Provider, MD  insulin glargine (LANTUS) 100 UNIT/ML injection Inject 0.18 mLs (18 Units total) into the skin at bedtime. 08/23/14   Barton Dubois, MD  linagliptin (TRADJENTA) 5 MG TABS tablet Take 1 tablet (5 mg total) by mouth daily. With largest meal 07/12/14   Tiffany L Reed, DO  metoprolol tartrate (LOPRESSOR) 25 MG tablet Take 12.5 mg by mouth daily. 06/02/14   Mihai Croitoru, MD  oxyCODONE-acetaminophen (PERCOCET/ROXICET) 5-325 MG per tablet Take one tablet by mouth every 4 hours as needed for pain 06/30/14   Tiffany L Reed, DO  pantoprazole (PROTONIX) 40 MG tablet Take 40 mg by mouth every evening.    Historical Provider, MD  potassium chloride 20 MEQ TBCR Take 40 mEq by mouth daily. 07/12/14   Tiffany L Reed, DO  sucralfate (CARAFATE) 1 G tablet Take 1 tablet (1 g total) by mouth 4 (four) times daily. 05/14/14   Kalman Drape, MD  Tamsulosin HCl (FLOMAX) 0.4 MG CAPS Take 0.4 mg by mouth daily.    Historical Provider, MD   BP 115/62 mmHg  Pulse 80  Resp 17  SpO2 100% Physical Exam  Constitutional: He is oriented to person, place, and time. He appears well-developed and well-nourished.  HENT:  Head: Normocephalic and atraumatic.  Eyes: Conjunctivae and EOM are normal. Pupils are equal, round, and reactive to light. Right eye exhibits no discharge. Left eye exhibits no discharge. No scleral icterus.  Neck: Normal range of motion. Neck supple. No JVD present.  Cardiovascular: Normal rate, regular rhythm and normal heart sounds.  Exam reveals no gallop and  no friction rub.   No murmur heard. Pulmonary/Chest: Effort normal and breath sounds normal. No respiratory distress. He has no wheezes. He has no rales. He exhibits no tenderness.  Left lower lobe crackles heard laterally while lying supine  Abdominal: Soft. He exhibits no distension and no mass. There is no tenderness. There is no rebound and no guarding.  Musculoskeletal: Normal range of motion. He exhibits no edema or tenderness.  Neurological: He is alert and oriented to person, place, and time.  Skin: Skin is warm and dry.  Psychiatric: He has a normal mood and affect. His behavior is normal. Judgment and thought content normal.  Nursing note and  vitals reviewed.   ED Course  Procedures (including critical care time) Results for orders placed or performed during the hospital encounter of 09/10/14  CBC with Differential  Result Value Ref Range   WBC 4.6 4.0 - 10.5 K/uL   RBC 3.49 (L) 4.22 - 5.81 MIL/uL   Hemoglobin 10.2 (L) 13.0 - 17.0 g/dL   HCT 32.0 (L) 39.0 - 52.0 %   MCV 91.7 78.0 - 100.0 fL   MCH 29.2 26.0 - 34.0 pg   MCHC 31.9 30.0 - 36.0 g/dL   RDW 16.4 (H) 11.5 - 15.5 %   Platelets 139 (L) 150 - 400 K/uL   Neutrophils Relative % 63 43 - 77 %   Neutro Abs 2.9 1.7 - 7.7 K/uL   Lymphocytes Relative 21 12 - 46 %   Lymphs Abs 1.0 0.7 - 4.0 K/uL   Monocytes Relative 15 (H) 3 - 12 %   Monocytes Absolute 0.7 0.1 - 1.0 K/uL   Eosinophils Relative 1 0 - 5 %   Eosinophils Absolute 0.0 0.0 - 0.7 K/uL   Basophils Relative 0 0 - 1 %   Basophils Absolute 0.0 0.0 - 0.1 K/uL  Comprehensive metabolic panel  Result Value Ref Range   Sodium 144 137 - 147 mEq/L   Potassium 3.7 3.7 - 5.3 mEq/L   Chloride 104 96 - 112 mEq/L   CO2 27 19 - 32 mEq/L   Glucose, Bld 174 (H) 70 - 99 mg/dL   BUN 16 6 - 23 mg/dL   Creatinine, Ser 0.81 0.50 - 1.35 mg/dL   Calcium 8.9 8.4 - 10.5 mg/dL   Total Protein 6.7 6.0 - 8.3 g/dL   Albumin 3.0 (L) 3.5 - 5.2 g/dL   AST 17 0 - 37 U/L   ALT 10 0 - 53  U/L   Alkaline Phosphatase 68 39 - 117 U/L   Total Bilirubin 1.0 0.3 - 1.2 mg/dL   GFR calc non Af Amer 82 (L) >90 mL/min   GFR calc Af Amer >90 >90 mL/min   Anion gap 13 5 - 15  Pro b natriuretic peptide (BNP)  Result Value Ref Range   Pro B Natriuretic peptide (BNP) 11604.0 (H) 0 - 450 pg/mL  Troponin I  Result Value Ref Range   Troponin I <0.30 <0.30 ng/mL   Dg Chest 1 View  08/22/2014   CLINICAL DATA:  Post LEFT thoracentesis, LEFT pleural effusion, personal history of diabetes mellitus, coronary artery disease, hypertension, non-Hodgkin's lymphoma, COPD, MI, lung cancer  EXAM: CHEST - 1 VIEW  COMPARISON:  08/21/2014  FINDINGS: RIGHT jugular Port-A-Cath stable tip projecting over SVC.  Enlargement of cardiac silhouette with mitral annular calcification.  Atherosclerotic calcification aorta.  Mediastinal contour stable.  BILATERAL pulmonary infiltrates greatest in LEFT upper lobe.  Decreased LEFT pleural effusion post thoracentesis.  No pneumothorax.  Minimal bibasilar atelectasis.  Diffuse osseous demineralization.  IMPRESSION: No pneumothorax following LEFT thoracentesis.  Persistent pulmonary infiltrates greatest in LEFT upper lobe; underlying mass/abnormalities in LEFT upper lobe not excluded with this appearance.  Decreased LEFT pleural effusion and minimal bibasilar atelectasis.  Enlargement of cardiac silhouette.   Electronically Signed   By: Lavonia Dana M.D.   On: 08/22/2014 12:11   Dg Chest 2 View  09/10/2014   CLINICAL DATA:  Short of breath. History of left-sided lung cancer. Hypertension. Ex-smoker. ICD10: R 6.02.  EXAM: CHEST  2 VIEW  COMPARISON:  09/06/2014  FINDINGS: A right-sided Port-A-Cath which terminates at the mid to  low SVC.  Moderate cardiomegaly. Left-sided pleural fluid is similar with loculation superiorly. Diffuse interstitial thickening. Mild volume loss at the right lung base. Patchy left-sided airspace disease with similar relative left mid lung aeration.   IMPRESSION: No significant change since the prior exam.  Partially loculated left-sided pleural effusion with pulmonary parenchymal opacification throughout the left lung base and left apex. Likely a combination of tumor and treatment effects. A component of interstitial edema cannot be excluded.   Electronically Signed   By: Abigail Miyamoto M.D.   On: 09/10/2014 18:18   Dg Chest 2 View  09/06/2014   CLINICAL DATA:  Left-sided chest discomfort with shortness of breath ; history of lung malignancy.  EXAM: CHEST  2 VIEW  COMPARISON:  Portable chest x-ray of August 22, 2014  FINDINGS: There is new volume loss on the left consistent with further accumulation pleural fluid at the lung base and in the apex. The pulmonary interstitial markings are increased in the left mid lung but are stable. On the right the interstitial markings are mildly increased though stable. The left heart border is obscured. The right heart border is unremarkable. There is dense calcification of the mitral valvular annulus. The pulmonary vascularity is not engorged. The power port appliance tip overlies the midportion of the SVC. The bony thorax is unremarkable.  IMPRESSION: There is new volume loss on the left most compatible with accumulation of pleural fluid. There is persistent left perihilar increased density consistent with central tumor.   Electronically Signed   By: David  Martinique   On: 09/06/2014 16:59   Dg Chest 2 View  08/21/2014   CLINICAL DATA:  80 year old male with shortness of breath. History of lung cancer.  EXAM: CHEST  2 VIEW  COMPARISON:  Chest x-ray 08/18/2014.  FINDINGS: Moderate left pleural effusion, potentially a partially loculated given the unusual appearance at the left lung base. Trace right pleural effusion. Cephalization of the pulmonary vasculature with indistinct interstitial markings and diffuse peribronchial cuffing. Mass like opacity in the left hilar and suprahilar region with extensive interstitial  opacities throughout the left upper lung, compatible with known of mass with probable postradiation changes. Cardiac silhouette is largely obscured, but appears enlarged. Upper mediastinal contours are partially obscured. Atherosclerosis in the thoracic aorta. Right internal jugular single-lumen power porta cath with tip terminating at the superior cavoatrial junction.  IMPRESSION: 1. Appearance the chest suggests mild congestive failure. 2. Partially loculated moderate left pleural effusion. 3. Known left upper lobe mass with probable surrounding postradiation changes in the left upper lobe. However, the possibility of pneumonia in the left upper lobe is not excluded, and clinical correlation is recommended.   Electronically Signed   By: Vinnie Langton M.D.   On: 08/21/2014 08:56   Dg Chest 2 View  08/18/2014   CLINICAL DATA:  Shortness of breath, left upper lobe lung cancer  EXAM: CHEST  2 VIEW  COMPARISON:  08/02/2014  FINDINGS: There is a moderate left pleural effusion. There is left upper lobe post treatment changes. There are increased interstitial markings in the right lung left upper lobe. Stable cardiomediastinal silhouette. Right-sided Port-A-Cath with the tip projecting over the SVC. Unremarkable osseous structures.  IMPRESSION: 1. Moderate left pleural effusion. 2. Bilateral interstitial prominence likely reflecting an element of chronic interstitial lung disease with superimposed mild interstitial edema.   Electronically Signed   By: Kathreen Devoid   On: 08/18/2014 15:27   US Thoracentesis Asp Pleural Space W/img Guide  08/22/2014  CLINICAL DATA:  Shortness of breath, recurrent left pleural effusion. Request therapeutic thoracentesis.  EXAM: ULTRASOUND GUIDED LEFT THORACENTESIS  COMPARISON:  Previous thoracentesis  PROCEDURE: An ultrasound guided thoracentesis was thoroughly discussed with the patient and questions answered. The benefits, risks, alternatives and complications were also  discussed. The patient understands and wishes to proceed with the procedure. Written consent was obtained.  Ultrasound was performed to localize and mark an adequate pocket of fluid in the left chest. The area was then prepped and draped in the normal sterile fashion. 1% Lidocaine was used for local anesthesia. Under ultrasound guidance a 6 French Safe-T-Centesis catheter was introduced. Thoracentesis was performed. The catheter was removed and a dressing applied.  COMPLICATIONS: None  FINDINGS: A total of approximately 1.4 L of clear, dark yellow fluid was removed. A fluid sample was notsent for laboratory analysis.  IMPRESSION: Successful ultrasound guided left thoracentesis yielding 1.4 L of pleural fluid.  Read by: Ascencion Dike PA-C   Electronically Signed   By: Aletta Edouard M.D.   On: 08/22/2014 11:48      EKG Interpretation None      MDM   Final diagnoses:  SOB (shortness of breath)    Patient with lung cancer and known pleural effusion which is presumably worsening.  Here with increased SOB.  Will check labs and CXR.  Anticipate admission.  5:23 PM Patient discussed with Dr. Venora Maples.  EKG today shows new atrial flutter.  No prior history of flutter or a-fib.   BNP is elevated, otherwise labs are reassuring.  I discussed the patient with Dr. Roel Cluck, who will admit.  Agree that CT angio would be beneficial to the patient at this time.  Will order in ED.   Montine Circle, PA-C 09/10/14 [place], MD 09/10/14 [phone]

## 2014-09-10 NOTE — ED Notes (Signed)
Pt w/ hx of lung cancer c/o SOB and lt sided CP (this is where his cancer is) since Thursday.

## 2014-09-10 NOTE — H&P (Signed)
PCP:  [name], MD  Oncology: Sherril  Chief Complaint:  Shortness of breath  HPI: [name] [name] is a 80 y.o. male   has a past medical history of Peripheral arterial disease; Diabetes mellitus without complication; CAD (coronary artery disease); Hyperlipidemia; Hypertension; Non Hodgkin's lymphoma (2000, 2010); Peripheral neuropathy; COPD (chronic obstructive pulmonary disease); History of lower GI bleeding; Substance abuse; Lung cancer, upper lobe; AAA (abdominal aortic aneurysm); and MI (myocardial infarction) (04/10/14).   Presented with  Patient has hx of Left upper lobe lung mass sp chemo therapy and radiation therapy this summer followed by Dr.Sherrill  He has a hx. of loculated left pleural effusion that has been tapped in the past by IR In the end of October at that time 1.4L were removed the thought was to consider option for pleurodex catheter if the effusion reccured. Patient is on home oxygen at 4L at baseline. He has been strugling with COPD and CHF both contributing to shortness of breath. He was supposed to see Dr. Benay Spice in office to address progression of his lung mass but started to have gradual worsening of shortness of breath and diminished tolerance to ambulate patient presented to [facility] ER.  CXR showing Partially loculated left-sided pleural effusion.  CT angio was done showing no PE, but evidence of Large left pleural effusion, increasing size of  left upper lobe/ paramediastinal neoplasm. Increasing left mid and upper lung consolidations/mass may represent neoplasm, posttreatment change versus infection. Patient denies any cough or fever Hospitalist was called for admission for Reccurent pleural effusion and hx of Lung mass causing dyspnea.   Review of Systems:    Pertinent positives include:  shortness of breath at rest.  dyspnea on exertion,  Constitutional:  No weight loss, night sweats, Fevers, chills, fatigue, weight loss  HEENT:  No headaches, Difficulty  swallowing,Tooth/dental problems,Sore throat,  No sneezing, itching, ear ache, nasal congestion, post nasal drip,  Cardio-vascular:  No chest pain, Orthopnea, PND, anasarca, dizziness, palpitations.no Bilateral lower extremity swelling  GI:  No heartburn, indigestion, abdominal pain, nausea, vomiting, diarrhea, change in bowel habits, loss of appetite, melena, blood in stool, hematemesis Resp:  no  No excess mucus, no productive cough, No non-productive cough, No coughing up of blood.No change in color of mucus.No wheezing. Skin:  no rash or lesions. No jaundice GU:  no dysuria, change in color of urine, no urgency or frequency. No straining to urinate.  No flank pain.  Musculoskeletal:  No joint pain or no joint swelling. No decreased range of motion. No back pain.  Psych:  No change in mood or affect. No depression or anxiety. No memory loss.  Neuro: no localizing neurological complaints, no tingling, no weakness, no double vision, no gait abnormality, no slurred speech, no confusion  Otherwise ROS are negative except for above, 10 systems were reviewed  Past Medical History: Past Medical History  Diagnosis Date  . Peripheral arterial disease     s/p Ao Bifem Bypass  . Diabetes mellitus without complication   . CAD (coronary artery disease)     By  CT Scan  . Hyperlipidemia   . Hypertension   . Non Hodgkin's lymphoma 2000, 2010  . Peripheral neuropathy   . COPD (chronic obstructive pulmonary disease)   . History of lower GI bleeding   . Substance abuse   . Lung cancer, upper lobe     left lung  . AAA (abdominal aortic aneurysm)   . MI (myocardial infarction) 04/10/14   Past  Surgical History  Procedure Laterality Date  . Fracture surgery  2009    Left ankle  . Pr vein bypass graft,aorto-fem-pop  2005    Right common femoral to BK popliteal BPG  . Pr vein bypass graft,aorto-fem-pop  01-27-05    Revision of Right fem-pop BPG  . Cardiac catheterization  1990's  . Video  bronchoscopy Bilateral 03/14/2014    Procedure: VIDEO BRONCHOSCOPY WITH FLUORO;  Surgeon: Rigoberto Noel, MD;  Location: [facility];  Service: Cardiopulmonary;  Laterality: Bilateral;  . Nm myoview ltd  11/2013    Lexiscan.  EF 60%, small fixed inferoapical defect - ? artifact  . Transthoracic echocardiogram  11/2013    EF 60-65%, Gr 1 DD, Mod AS, Sever MAC - no MS.        Medications: Prior to Admission medications   Medication Sig Start Date End Date Taking? Authorizing Provider  amiodarone (PACERONE) 200 MG tablet Take 200 mg by mouth 2 (two) times daily.   Yes Historical Provider, MD  aspirin 81 MG tablet Take 81 mg by mouth 2 (two) times daily.    Yes Historical Provider, MD  atorvastatin (LIPITOR) 40 MG tablet Take 40 mg by mouth daily.   Yes Historical Provider, MD  citalopram (CELEXA) 20 MG tablet Take 20 mg by mouth daily.   Yes Historical Provider, MD  feeding supplement, GLUCERNA SHAKE, (GLUCERNA SHAKE) LIQD Take 237 mLs by mouth daily. 08/23/14  Yes Barton Dubois, MD  finasteride (PROSCAR) 5 MG tablet Take 5 mg by mouth daily.   Yes Historical Provider, MD  furosemide (LASIX) 40 MG tablet Take 40 mg by mouth daily.   Yes Historical Provider, MD  gabapentin (NEURONTIN) 600 MG tablet Take 600 mg by mouth 2 (two) times daily.    Yes Historical Provider, MD  Glucosamine-Chondroit-Vit C-Mn (GLUCOSAMINE CHONDR 1500 COMPLX) CAPS Take 1 capsule by mouth daily.   Yes Historical Provider, MD  insulin aspart (NOVOLOG) 100 UNIT/ML injection Inject 5 Units into the skin 2 (two) times daily.   Yes Historical Provider, MD  insulin glargine (LANTUS) 100 UNIT/ML injection Inject 0.18 mLs (18 Units total) into the skin at bedtime. Patient taking differently: Inject 10 Units into the skin at bedtime.  08/23/14  Yes Barton Dubois, MD  linagliptin (TRADJENTA) 5 MG TABS tablet Take 1 tablet (5 mg total) by mouth daily. With largest meal 07/12/14  Yes Tiffany L Reed, DO  metoprolol tartrate (LOPRESSOR) 25  MG tablet Take 12.5 mg by mouth daily. 06/02/14  Yes Mihai Croitoru, MD  oxyCODONE-acetaminophen (PERCOCET/ROXICET) 5-325 MG per tablet Take one tablet by mouth every 4 hours as needed for pain 06/30/14  Yes Tiffany L Reed, DO  pantoprazole (PROTONIX) 40 MG tablet Take 40 mg by mouth every evening.   Yes Historical Provider, MD  potassium chloride 20 MEQ TBCR Take 40 mEq by mouth daily. 07/12/14  Yes Tiffany L Reed, DO  sucralfate (CARAFATE) 1 G tablet Take 1 tablet (1 g total) by mouth 4 (four) times daily. 05/14/14  Yes Kalman Drape, MD  Tamsulosin HCl (FLOMAX) 0.4 MG CAPS Take 0.4 mg by mouth daily.   Yes Historical Provider, MD    Allergies:  No Known Allergies  Social History:  Ambulatory walker   Lives at home alone,        reports that he quit smoking about 10 years ago. His smoking use included Cigarettes. He has a 50 pack-year smoking history. He has never used smokeless tobacco. He reports that he  does not drink alcohol or use illicit drugs.    Family History: family history includes Cancer in his mother; Heart attack in his mother; Hyperlipidemia in his brother, daughter, father, and sister; Hypertension in his son; Stroke in his father.    Physical Exam: Patient Vitals for the past 24 hrs:  BP Temp Temp src Pulse Resp SpO2  09/10/14 1831 105/60 mmHg 97.5 F (36.4 C) Oral 77 20 100 %  09/10/14 1709 - 98 F (36.7 C) Rectal - - -  09/10/14 1703 115/62 mmHg - - 80 17 100 %    1. General:  in No Acute distress 2. Psychological: Alert and  Oriented 3. Head/ENT:   Moist  Mucous Membranes                          Head Non traumatic, neck supple                          Normal  Dentition 4. SKIN:  decreased Skin turgor,  Skin clean Dry and intact no rash 5. Heart: Regular rate and rhythm no Murmur, Rub or gallop 6. Lungs:  no wheezes some crackles  Noted, decreased breath sounds on left base 7. Abdomen: Soft, non-tender, Non distended 8. Lower extremities: no clubbing,  cyanosis, or edema 9. Neurologically Grossly intact, moving all 4 extremities equally 10. MSK: Normal range of motion  body mass index is unknown because there is no weight on file.   Labs on Admission:   Results for orders placed or performed during the hospital encounter of 09/10/14 (from the past 24 hour(s))  CBC with Differential     Status: Abnormal   Collection Time: 09/10/14  5:44 PM  Result Value Ref Range   WBC 4.6 4.0 - 10.5 K/uL   RBC 3.49 (L) 4.22 - 5.81 MIL/uL   Hemoglobin 10.2 (L) 13.0 - 17.0 g/dL   HCT 32.0 (L) 39.0 - 52.0 %   MCV 91.7 78.0 - 100.0 fL   MCH 29.2 26.0 - 34.0 pg   MCHC 31.9 30.0 - 36.0 g/dL   RDW 16.4 (H) 11.5 - 15.5 %   Platelets 139 (L) 150 - 400 K/uL   Neutrophils Relative % 63 43 - 77 %   Neutro Abs 2.9 1.7 - 7.7 K/uL   Lymphocytes Relative 21 12 - 46 %   Lymphs Abs 1.0 0.7 - 4.0 K/uL   Monocytes Relative 15 (H) 3 - 12 %   Monocytes Absolute 0.7 0.1 - 1.0 K/uL   Eosinophils Relative 1 0 - 5 %   Eosinophils Absolute 0.0 0.0 - 0.7 K/uL   Basophils Relative 0 0 - 1 %   Basophils Absolute 0.0 0.0 - 0.1 K/uL  Comprehensive metabolic panel     Status: Abnormal   Collection Time: 09/10/14  5:44 PM  Result Value Ref Range   Sodium 144 137 - 147 mEq/L   Potassium 3.7 3.7 - 5.3 mEq/L   Chloride 104 96 - 112 mEq/L   CO2 27 19 - 32 mEq/L   Glucose, Bld 174 (H) 70 - 99 mg/dL   BUN 16 6 - 23 mg/dL   Creatinine, Ser 0.81 0.50 - 1.35 mg/dL   Calcium 8.9 8.4 - 10.5 mg/dL   Total Protein 6.7 6.0 - 8.3 g/dL   Albumin 3.0 (L) 3.5 - 5.2 g/dL   AST 17 0 - 37 U/L   ALT 10 0 -  53 U/L   Alkaline Phosphatase 68 39 - 117 U/L   Total Bilirubin 1.0 0.3 - 1.2 mg/dL   GFR calc non Af Amer 82 (L) >90 mL/min   GFR calc Af Amer >90 >90 mL/min   Anion gap 13 5 - 15  Pro b natriuretic peptide (BNP)     Status: Abnormal   Collection Time: 09/10/14  5:44 PM  Result Value Ref Range   Pro B Natriuretic peptide (BNP) 11604.0 (H) 0 - 450 pg/mL  Troponin I     Status:  None   Collection Time: 09/10/14  5:44 PM  Result Value Ref Range   Troponin I <0.30 <0.30 ng/mL    Lab Results  Component Value Date   HGBA1C 6.5* 08/18/2014    Estimated Creatinine Clearance: 78.8 mL/min (by C-G formula based on Cr of 0.81).  BNP (last 3 results)  Recent Labs  08/18/14 1512 08/22/14 0535 09/10/14 1744  PROBNP 22975.0* 11824.0* 11604.0*    Other results:  I have pearsonaly reviewed this: ECG REPORT  Rate: 81  Rhythm: atrial flutter ST&T Change: no ischemic changes QTc 449  There were no vitals filed for this visit.   Cultures:    Component Value Date/Time   SDES BRONCHIAL ALVEOLAR LAVAGE 03/14/2014 1521   SDES BRONCHIAL ALVEOLAR LAVAGE 03/14/2014 1521   SDES BRONCHIAL ALVEOLAR LAVAGE 03/14/2014 1521   SPECREQUEST NONE 03/14/2014 1521   SPECREQUEST NONE 03/14/2014 1521   SPECREQUEST NONE 03/14/2014 1521   CULT  03/14/2014 1521    NO ACID FAST BACILLI ISOLATED IN 6 WEEKS Performed at [place]  03/14/2014 1521    Non-Pathogenic Oropharyngeal-type Flora Isolated. Performed at [place]  03/14/2014 1521    No Fungi Isolated in 4 Weeks Performed at [place] 04/26/2014 FINAL 03/14/2014 1521   REPTSTATUS 03/17/2014 FINAL 03/14/2014 1521   REPTSTATUS 04/09/2014 FINAL 03/14/2014 1521     Radiological Exams on Admission: Dg Chest 2 View  09/10/2014   CLINICAL DATA:  Short of breath. History of left-sided lung cancer. Hypertension. Ex-smoker. ICD10: R 6.02.  EXAM: CHEST  2 VIEW  COMPARISON:  09/06/2014  FINDINGS: A right-sided Port-A-Cath which terminates at the mid to low SVC.  Moderate cardiomegaly. Left-sided pleural fluid is similar with loculation superiorly. Diffuse interstitial thickening. Mild volume loss at the right lung base. Patchy left-sided airspace disease with similar relative left mid lung aeration.  IMPRESSION: No significant change since the prior exam.  Partially  loculated left-sided pleural effusion with pulmonary parenchymal opacification throughout the left lung base and left apex. Likely a combination of tumor and treatment effects. A component of interstitial edema cannot be excluded.   Electronically Signed   By: Abigail Miyamoto M.D.   On: 09/10/2014 18:18    Chart has been reviewed  Assessment/Plan  80 yo M with hx of Combined diastolic and systolic heart failure, DM, lung mass with recurrent pleural effusio here with dyspnea in the setting of worsening pleural effusion.   Present on Admission:  . SOB (shortness of breath) - likely multifactorial, combination of worsening pleural effusion, CHF combined systolic ad diastolic and hx of COPD. In the morning can perform repeat thoracentesis. Consult interventional radiologist that has done as the past. Patient states that his oncologist requests signing off pathology for evaluation for malignancy. Continue medications for COPD treatment albuterol and Atrovent. Continue Lasix for CHF . DM (diabetes mellitus) type II controlled, neurological manifestation -  this patient will be nothing by mouth for possible procedure post midnight with decrease Levemir to 5 units and order sensitive sliding scale . Essential hypertension-  Continue home medications . Coronary atherosclerosis - continue statin beta blocker and aspirin . Lung cancer, upper lobe - we will need to discuss with oncology in the morning. Given recurrent pleural effusion and poor performance status with multiple comorbidities patient would likely benefit from discussion of goals of care . COPD (chronic obstructive pulmonary disease) - continue albuterol as needed and Atrovent . PAF (paroxysmal atrial fibrillation) - continue Toprol and amiodarone. Patient is on aspirin 81 mg twice a day. Patient requires recurrent procedures and lives alone at home high risk for falls.  . Acute on chronic combined systolic and diastolic congestive heart failure, NYHA  class 4  - continue Lasix, avoid fluid overload . Pleural effusion - in a.m. We will need to discuss with interventional radiology to perform a repeat thoracentesis. Will need to discuss with oncology was been following him closely if at this point plurodex catheter would be an option  Prophylaxis: SCD old off on Lovenox as prophylaxis given potential need for for centesis in the morning, Protonix  CODE STATUS:  FULL CODE as per patient's request  Other plan as per orders.  I have spent a total of 65 min on this admission  [name] [name] 09/10/2014, 8:05 PM  [facility]  Pager [phone]   after 2 AM please page floor coverage PA If 7AM-7PM, please contact the day team taking care of the patient  [email or web address]  Password [email or web address]

## 2014-09-10 NOTE — ED Notes (Signed)
Pt returned from CT chest

## 2014-09-11 ENCOUNTER — Inpatient Hospital Stay (HOSPITAL_COMMUNITY): Payer: Medicare Other

## 2014-09-11 DIAGNOSIS — E119 Type 2 diabetes mellitus without complications: Secondary | ICD-10-CM

## 2014-09-11 DIAGNOSIS — I509 Heart failure, unspecified: Secondary | ICD-10-CM

## 2014-09-11 DIAGNOSIS — C3412 Malignant neoplasm of upper lobe, left bronchus or lung: Secondary | ICD-10-CM

## 2014-09-11 DIAGNOSIS — N189 Chronic kidney disease, unspecified: Secondary | ICD-10-CM

## 2014-09-11 DIAGNOSIS — I10 Essential (primary) hypertension: Secondary | ICD-10-CM

## 2014-09-11 DIAGNOSIS — Z8572 Personal history of non-Hodgkin lymphomas: Secondary | ICD-10-CM

## 2014-09-11 DIAGNOSIS — J948 Other specified pleural conditions: Secondary | ICD-10-CM

## 2014-09-11 DIAGNOSIS — I2510 Atherosclerotic heart disease of native coronary artery without angina pectoris: Secondary | ICD-10-CM

## 2014-09-11 DIAGNOSIS — J90 Pleural effusion, not elsewhere classified: Secondary | ICD-10-CM | POA: Insufficient documentation

## 2014-09-11 DIAGNOSIS — J449 Chronic obstructive pulmonary disease, unspecified: Secondary | ICD-10-CM

## 2014-09-11 LAB — URINALYSIS, ROUTINE W REFLEX MICROSCOPIC
Bilirubin Urine: NEGATIVE
GLUCOSE, UA: NEGATIVE mg/dL
KETONES UR: NEGATIVE mg/dL
Nitrite: POSITIVE — AB
PH: 5.5 (ref 5.0–8.0)
Protein, ur: NEGATIVE mg/dL
Urobilinogen, UA: 0.2 mg/dL (ref 0.0–1.0)

## 2014-09-11 LAB — CBC
HEMATOCRIT: 31.8 % — AB (ref 39.0–52.0)
HEMOGLOBIN: 10.2 g/dL — AB (ref 13.0–17.0)
MCH: 29.5 pg (ref 26.0–34.0)
MCHC: 32.1 g/dL (ref 30.0–36.0)
MCV: 91.9 fL (ref 78.0–100.0)
Platelets: 126 10*3/uL — ABNORMAL LOW (ref 150–400)
RBC: 3.46 MIL/uL — ABNORMAL LOW (ref 4.22–5.81)
RDW: 16.5 % — AB (ref 11.5–15.5)
WBC: 4.1 10*3/uL (ref 4.0–10.5)

## 2014-09-11 LAB — COMPREHENSIVE METABOLIC PANEL
ALBUMIN: 2.9 g/dL — AB (ref 3.5–5.2)
ALT: 9 U/L (ref 0–53)
ANION GAP: 11 (ref 5–15)
AST: 17 U/L (ref 0–37)
Alkaline Phosphatase: 67 U/L (ref 39–117)
BUN: 16 mg/dL (ref 6–23)
CHLORIDE: 100 meq/L (ref 96–112)
CO2: 29 mEq/L (ref 19–32)
Calcium: 9.1 mg/dL (ref 8.4–10.5)
Creatinine, Ser: 0.88 mg/dL (ref 0.50–1.35)
GFR calc Af Amer: >90 mL/min (ref >90)
GFR calc non Af Amer: 80 mL/min — ABNORMAL LOW (ref >90)
Glucose, Bld: 116 mg/dL — ABNORMAL HIGH (ref 70–99)
Potassium: 3.7 mEq/L (ref 3.7–5.3)
SODIUM: 140 meq/L (ref 137–147)
TOTAL PROTEIN: 6.6 g/dL (ref 6.0–8.3)
Total Bilirubin: 1.1 mg/dL (ref 0.3–1.2)

## 2014-09-11 LAB — GLUCOSE, CAPILLARY
GLUCOSE-CAPILLARY: 143 mg/dL — AB (ref 70–99)
GLUCOSE-CAPILLARY: 206 mg/dL — AB (ref 70–99)
Glucose-Capillary: 212 mg/dL — ABNORMAL HIGH (ref 70–99)
Glucose-Capillary: 120 mg/dL — ABNORMAL HIGH (ref 70–99)
Glucose-Capillary: 129 mg/dL — ABNORMAL HIGH (ref 70–99)
Glucose-Capillary: 133 mg/dL — ABNORMAL HIGH (ref 70–99)

## 2014-09-11 LAB — PREALBUMIN: Prealbumin: 15.9 mg/dL — ABNORMAL LOW (ref 17.0–34.0)

## 2014-09-11 LAB — TROPONIN I
Troponin I: <0.3 ng/mL (ref <0.30)
Troponin I: <0.3 ng/mL (ref <0.30)

## 2014-09-11 LAB — URINE MICROSCOPIC-ADD ON

## 2014-09-11 LAB — PHOSPHORUS: PHOSPHORUS: 4.3 mg/dL (ref 2.3–4.6)

## 2014-09-11 LAB — TSH: TSH: 1.89 u[IU]/mL (ref 0.350–4.500)

## 2014-09-11 LAB — HEMOGLOBIN A1C
Hgb A1c MFr Bld: 7.1 % — ABNORMAL HIGH (ref <5.7)
Mean Plasma Glucose: 157 mg/dL — ABNORMAL HIGH (ref <117)

## 2014-09-11 LAB — MAGNESIUM: MAGNESIUM: 1.2 mg/dL — AB (ref 1.5–2.5)

## 2014-09-11 MED ORDER — SODIUM CHLORIDE 0.9 % IJ SOLN
10.0000 mL | INTRAMUSCULAR | Status: DC | PRN
  Administered 2014-09-11 (×2): 20 mL
  Administered 2014-09-11 – 2014-09-12 (×3): 10 mL
  Filled 2014-09-11 (×4): qty 40

## 2014-09-11 MED ORDER — IPRATROPIUM BROMIDE 0.02 % IN SOLN: 0.5000 mg | RESPIRATORY_TRACT | Status: DC | PRN

## 2014-09-11 NOTE — Plan of Care (Signed)
Problem: Phase I Progression Outcomes Goal: Voiding-avoid urinary catheter unless indicated Outcome: Completed/Met Date Met:  09/11/14

## 2014-09-11 NOTE — Consult Note (Signed)
[place] CONSULT NOTE  Patient Care Team: [name], MD as PCP - General Ladell Pier, MD as Consulting Physician (Oncology)  CHIEF COMPLAINTS/PURPOSE OF CONSULTATION:  Lung cancer with pleural effusions  HISTORY OF PRESENTING ILLNESS:  [name] [name] 80 y.o. male is here because of worsening shortness of breath sinceThursday. 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 After the conclusion of chemoradiation, restaging evaluation revealed a decrease in the left upper lobe mass. He had bilateral pleural effusions. He also has underlying coronary artery disease with episodes of congestive heart failure as well as severe COPD. He also has chronic renal failure and diabetes and a previous history of non-Hodgkin's lymphoma. He had 2 episodes of thoracentesis for recurrent pleural effusions. Unfortunately cytology was not sent on either of those specimens. He is currently admitted once again with complaints of shortness of breath and pleural effusion. We're consulted to discuss Options including pleurodesis versus thoracentesis versus Pleurx catheter placement.  I reviewed her records extensively and collaborated the history with the patient.  MEDICAL HISTORY:  Past Medical History  Diagnosis Date  . Peripheral arterial disease     s/p Ao Bifem Bypass  . Diabetes mellitus without complication   . CAD (coronary artery disease)     By  CT Scan  . Hyperlipidemia   . Hypertension   . Non Hodgkin's lymphoma 2000, 2010  . Peripheral neuropathy   . COPD (chronic obstructive pulmonary disease)   . History of lower GI bleeding   . Substance abuse   . Lung cancer, upper lobe     left lung  . AAA (abdominal aortic aneurysm)   . MI (myocardial infarction) 04/10/14    SURGICAL HISTORY: Past Surgical  History  Procedure Laterality Date  . Fracture surgery  2009    Left ankle  . Pr vein bypass graft,aorto-fem-pop  2005    Right common femoral to BK popliteal BPG  . Pr vein bypass graft,aorto-fem-pop  01-27-05    Revision of Right fem-pop BPG  . Cardiac catheterization  1990's  . Video bronchoscopy Bilateral 03/14/2014    Procedure: VIDEO BRONCHOSCOPY WITH FLUORO;  Surgeon: Rigoberto Noel, MD;  Location: [facility];  Service: Cardiopulmonary;  Laterality: Bilateral;  . Nm myoview ltd  11/2013    Lexiscan.  EF 60%, small fixed inferoapical defect - ? artifact  . Transthoracic echocardiogram  11/2013    EF 60-65%, Gr 1 DD, Mod AS, Sever MAC - no MS.       SOCIAL HISTORY: History   Social History  . Marital Status: Divorced    Spouse Name: N/A    Number of Children: 2  . Years of Education: N/A   Occupational History  . retired Administrator    Social History Main Topics  . Smoking status: Former Smoker -- 1.00 packs/day for 50 years    Types: Cigarettes    Quit date: 10/29/2003  . Smokeless tobacco: Never Used  . Alcohol Use: No  . Drug Use: No  . Sexual Activity: No   Other Topics Concern  . Not on file   Social History Narrative    FAMILY HISTORY: Family History  Problem Relation Age of Onset  . Cancer Mother     Colon  . Heart attack Mother   . Stroke Father   . Hyperlipidemia  Father   . Hyperlipidemia Sister   . Hyperlipidemia Brother   . Hyperlipidemia Daughter   . Hypertension Son     ALLERGIES:  has No Known Allergies.  MEDICATIONS:  Current Facility-Administered Medications  Medication Dose Route Frequency Provider Last Rate Last Dose  . 0.9 %  sodium chloride infusion  250 mL Intravenous PRN Toy Baker, MD      . acetaminophen (TYLENOL) tablet 650 mg  650 mg Oral Q6H PRN Toy Baker, MD       Or  . acetaminophen (TYLENOL) suppository 650 mg  650 mg Rectal Q6H PRN Toy Baker, MD      . albuterol (PROVENTIL) (2.5 MG/3ML)  0.083% nebulizer solution 2.5 mg  2.5 mg Nebulization Q2H PRN Toy Baker, MD      . amiodarone (PACERONE) tablet 200 mg  200 mg Oral BID Toy Baker, MD   200 mg at 09/10/14 2341  . antiseptic oral rinse (CPC / CETYLPYRIDINIUM CHLORIDE 0.05%) solution 7 mL  7 mL Mouth Rinse BID Toy Baker, MD   7 mL at 09/10/14 2343  . aspirin EC tablet 81 mg  81 mg Oral BID Toy Baker, MD   81 mg at 09/10/14 2341  . atorvastatin (LIPITOR) tablet 40 mg  40 mg Oral q1800 Toy Baker, MD      . citalopram (CELEXA) tablet 20 mg  20 mg Oral Daily Toy Baker, MD      . docusate sodium (COLACE) capsule 100 mg  100 mg Oral BID Toy Baker, MD   100 mg at 09/10/14 2340  . finasteride (PROSCAR) tablet 5 mg  5 mg Oral Daily Toy Baker, MD   5 mg at 09/10/14 2341  . furosemide (LASIX) tablet 40 mg  40 mg Oral Daily Toy Baker, MD      . gabapentin (NEURONTIN) capsule 600 mg  600 mg Oral BID Toy Baker, MD   600 mg at 09/10/14 2340  . guaiFENesin (MUCINEX) 12 hr tablet 600 mg  600 mg Oral BID Toy Baker, MD   600 mg at 09/10/14 2341  . HYDROcodone-acetaminophen (NORCO/VICODIN) 5-325 MG per tablet 1-2 tablet  1-2 tablet Oral Q4H PRN Toy Baker, MD   2 tablet at 09/10/14 2345  . insulin aspart (novoLOG) injection 0-9 Units  0-9 Units Subcutaneous 6 times per day Toy Baker, MD   1 Units at 09/11/14 0821  . insulin glargine (LANTUS) injection 5 Units  5 Units Subcutaneous QHS Toy Baker, MD   5 Units at 09/10/14 2343  . ipratropium (ATROVENT) nebulizer solution 0.5 mg  0.5 mg Nebulization Q4H PRN Toy Baker, MD      . metoprolol tartrate (LOPRESSOR) tablet 12.5 mg  12.5 mg Oral Daily Toy Baker, MD   12.5 mg at 09/10/14 2342  . ondansetron (ZOFRAN) tablet 4 mg  4 mg Oral Q6H PRN Toy Baker, MD       Or  . ondansetron (ZOFRAN) injection 4 mg  4 mg Intravenous Q6H PRN Toy Baker, MD      .  oxyCODONE-acetaminophen (PERCOCET/ROXICET) 5-325 MG per tablet 1 tablet  1 tablet Oral Q6H PRN Toy Baker, MD      . pantoprazole (PROTONIX) EC tablet 40 mg  40 mg Oral QPM Toy Baker, MD   40 mg at 09/10/14 2342  . potassium chloride (K-DUR) CR tablet 40 mEq  40 mEq Oral Daily Toy Baker, MD      . sodium chloride 0.9 % injection 10-40 mL  10-40 mL Intracatheter PRN  Toy Baker, MD   20 mL at 09/11/14 0617  . sodium chloride 0.9 % injection 3 mL  3 mL Intravenous Q12H Toy Baker, MD   3 mL at 09/10/14 2344  . sodium chloride 0.9 % injection 3 mL  3 mL Intravenous Q12H Toy Baker, MD   3 mL at 09/10/14 2343  . sodium chloride 0.9 % injection 3 mL  3 mL Intravenous PRN Toy Baker, MD      . sucralfate (CARAFATE) tablet 1 g  1 g Oral QID Toy Baker, MD   1 g at 09/10/14 2340  . tamsulosin (FLOMAX) capsule 0.4 mg  0.4 mg Oral QHS Toy Baker, MD   0.4 mg at 09/10/14 2342   Facility-Administered Medications Ordered in Other Encounters  Medication Dose Route Frequency Provider Last Rate Last Dose  . 0.9 %  sodium chloride infusion   Intravenous Once Drue Second, NP        REVIEW OF SYSTEMS:   Constitutional: Denies fevers, chills or abnormal night sweats; complaints of fatigue cough and shortness of breath Respiratory: shortness of breath and wheezing Cardiovascular: Denies palpitation, chest discomfort or lower extremity swelling Gastrointestinal:  Denies nausea, heartburn or change in bowel habits Neurological:mild numbness in extremities All other systems were reviewed with the patient and are negative.  PHYSICAL EXAMINATION: ECOG PERFORMANCE STATUS: 2 - Symptomatic, <50% confined to bed  Filed Vitals:   09/11/14 0422  BP: 90/58  Pulse: 72  Temp: 97.9 F (36.6 C)  Resp: 18   Filed Weights   09/10/14 2145  Weight: 189 lb 13.1 oz (86.1 kg)    EYES: normal, conjunctiva are pink and non-injected, sclera  clear OROPHARYNX:no exudate, no erythema and lips, buccal mucosa, and tongue normal  LUNGS: coarse crackles in the left lung from top to bottom with expiratory wheeze HEART: regular rate & rhythm and no murmurs and no lower extremity edema ABDOMEN:abdomen soft, non-tender and normal bowel sounds NEURO:mild peripheral neuropathy  LABORATORY DATA:  I have reviewed the data as listed Lab Results  Component Value Date   WBC 4.1 09/11/2014   HGB 10.2* 09/11/2014   HCT 31.8* 09/11/2014   MCV 91.9 09/11/2014   PLT 126* 09/11/2014   Lab Results  Component Value Date   NA 140 09/11/2014   K 3.7 09/11/2014   CL 100 09/11/2014   CO2 29 09/11/2014    ASSESSMENT AND PLAN:  1. Recurrent pleural effusions with a history of squamous cell carcinoma the lung treated with concurrent chemoradiation differential diagnosis is between congestive heart failure and lung cancer. I recommend sending the pleural fluid for cytology to assess whether this is related to lung cancer. If it is lung cancer related, that he may benefit from pleurodesis or Pleurx catheter placement. If it is related to CHF, I would recommend discussing with cardiology regarding the best treatment course. Recommendation:send the pleural fluid for cytology and if malignant, consider Pleurx versus pleurodesis.  2. Squamous cell lung cancer: Status post concurrent chemoradiation completed in August 2015. CT chest showing slight increase in size of the left upper lobe/paramediastinal neoplasm with additional consolidations noted around it. It is possible that many of these changes could be radiation related and may not necessarily mean cancer progression. Dr. [name] will be here tomorrow to discuss further plans.  All questions were answered. The patient knows to call the clinic with any problems, questions or concerns. I spent 40 minutes counseling the patient face to face. The total time spent in the  appointment was 55 minutes and more  than 50% was on counseling.     Rulon Eisenmenger, MD @T @ 9:25 AM

## 2014-09-11 NOTE — Procedures (Signed)
Successful US guided left thoracentesis. Yielded 1.6L of clear yellow fluid. Pt tolerated procedure well. No immediate complications. Pt's  O2 was increased to 3L at his request,  though he did not have any drop in his O2 sats during procedure.  Specimen was sent for labs. CXR ordered.  Ascencion Dike PA-C 09/11/2014 11:22 AM

## 2014-09-11 NOTE — Plan of Care (Signed)
Problem: Phase I Progression Outcomes Goal: OOB as tolerated unless otherwise ordered Outcome: Completed/Met Date Met:  09/11/14     

## 2014-09-11 NOTE — Progress Notes (Signed)
[facility] PROGRESS NOTE  CHICO CAWOOD OIZ:[id number] DOB: 14-Jun-1933 DOA: 09/10/2014 PCP: [name], MD  Assessment/Plan: Shortness of breath -Has probably multiple contributing factors including worsening left-sided pleural effusion, history of CHF, COPD, history of lung cancer although suspect main player and acute worsening is his pleural effusion. -Is status post left-sided thoracentesis with marked symptomatic improvement.  -Fluid sent for cytology.  Type 2 diabetes -Fair control  Hypertension -Blood pressure soft, monitor blood pressure and adjust medications as needed.  Lung cancer -As per oncology.  COPD -Well-controlled  Chronic combined CHF  -Ovoid fluids, continue home medications including Lasix.  Paroxysmal atrial fibrillation -Continue metoprolol and amiodarone. -Patient has been maintained on aspirin, not on chronic anticoagulation given he lives at home and high risk for falls.  Code Status: full code Family Communication: multiple family members at bedside and updated on plan of care  Disposition Plan: home likely in 24-48 hours   Consultants:  oncology   Antibiotics:  none   Subjective: Feels better after thoracentesis  Objective: Filed Vitals:   09/11/14 1100 09/11/14 1115 09/11/14 1213 09/11/14 1359  BP: 91/52 84/46 109/61 95/53  Pulse:   50 66  Temp:    97.1 F (36.2 C)  TempSrc:    Axillary  Resp:    20  Height:      Weight:      SpO2:    100%    Intake/Output Summary (Last 24 hours) at 09/11/14 1652 Last data filed at 09/11/14 1230  Gross per 24 hour  Intake    360 ml  Output    150 ml  Net    210 ml   Filed Weights   09/10/14 2145  Weight: 86.1 kg (189 lb 13.1 oz)    Exam:   General:  Alert, awake, oriented 3  Cardiovascular: irregular, not tachycardic  Respiratory: clear to auscultation bilaterally  Abdomen: soft, nontender, nondistended, positive bowel sounds  Extremities: trace pitting  edema bilaterally   Neurologic:  Intact and nonfocal  Data Reviewed: Basic Metabolic Panel:  Recent Labs Lab 09/10/14 1744 09/11/14 0617  NA 144 140  K 3.7 3.7  CL 104 100  CO2 27 29  GLUCOSE 174* 116*  BUN 16 16  CREATININE 0.81 0.88  CALCIUM 8.9 9.1  MG  --  1.2*  PHOS  --  4.3   Liver Function Tests:  Recent Labs Lab 09/10/14 1744 09/11/14 0617  AST 17 17  ALT 10 9  ALKPHOS 68 67  BILITOT 1.0 1.1  PROT 6.7 6.6  ALBUMIN 3.0* 2.9*   No results for input(s): LIPASE, AMYLASE in the last 168 hours. No results for input(s): AMMONIA in the last 168 hours. CBC:  Recent Labs Lab 09/10/14 1744 09/11/14 0617  WBC 4.6 4.1  NEUTROABS 2.9  --   HGB 10.2* 10.2*  HCT 32.0* 31.8*  MCV 91.7 91.9  PLT 139* 126*   Cardiac Enzymes:  Recent Labs Lab 09/10/14 1744 09/11/14 0018 09/11/14 0617 09/11/14 1230  TROPONINI <0.30 <0.30 <0.30 <0.30   BNP (last 3 results)  Recent Labs  08/18/14 1512 08/22/14 0535 09/10/14 1744  PROBNP 22975.0* 11824.0* 11604.0*   CBG:  Recent Labs Lab 09/11/14 0127 09/11/14 0419 09/11/14 0802  GLUCAP 212* 120* 129*    No results found for this or any previous visit (from the past 240 hour(s)).   Studies: Dg Chest 1 View  09/11/2014   CLINICAL DATA:  80 year old male status post left thoracentesis  EXAM:  CHEST - 1 VIEW  COMPARISON:  Prior chest radiograph 09/10/2014  FINDINGS: No evidence of pneumothorax status post left thoracentesis. Overall, the volume of the left pleural effusion has decreased. There is incomplete expansion of the left lung with resultant right-to-left shift of the cardiac and mediastinal structures. Right IJ approach single-lumen power injectable port catheter. Catheter tip overlies the superior cavoatrial junction. Chronic bronchitic change and interstitial prominence similar compared to prior. Atherosclerotic calcification is present in the transverse aorta. No acute osseous abnormality.  IMPRESSION: 1. No  evidence of pneumothorax status post left thoracentesis. 2. Decreased left pleural effusion with incomplete re-expansion of the left lung resulting in persistent right-to-left shift of the cardiac and mediastinal structures.   Electronically Signed   By: Jacqulynn Cadet M.D.   On: 09/11/2014 12:23   Dg Chest 2 View  09/10/2014   CLINICAL DATA:  Short of breath. History of left-sided lung cancer. Hypertension. Ex-smoker. ICD10: R 6.02.  EXAM: CHEST  2 VIEW  COMPARISON:  09/06/2014  FINDINGS: A right-sided Port-A-Cath which terminates at the mid to low SVC.  Moderate cardiomegaly. Left-sided pleural fluid is similar with loculation superiorly. Diffuse interstitial thickening. Mild volume loss at the right lung base. Patchy left-sided airspace disease with similar relative left mid lung aeration.  IMPRESSION: No significant change since the prior exam.  Partially loculated left-sided pleural effusion with pulmonary parenchymal opacification throughout the left lung base and left apex. Likely a combination of tumor and treatment effects. A component of interstitial edema cannot be excluded.   Electronically Signed   By: Abigail Miyamoto M.D.   On: 09/10/2014 18:18   Ct Angio Chest Pe W/cm &/or Wo Cm  09/10/2014   CLINICAL DATA:  80 year old male with increasing shortness of breath. Patient with non-small cell left lung cancer.  EXAM: CT ANGIOGRAPHY CHEST WITH CONTRAST  TECHNIQUE: Multidetector CT imaging of the chest was performed using the standard protocol during bolus administration of intravenous contrast. Multiplanar CT image reconstructions and MIPs were obtained to evaluate the vascular anatomy.  CONTRAST:  157mL OMNIPAQUE IOHEXOL 350 MG/ML SOLN  COMPARISON:  09/10/2014 and prior chest radiographs. 07/11/2014 and prior chest CTs.  FINDINGS: This is a technically satisfactory study.  There is no evidence of acute pulmonary emboli.  Cardiomegaly and heavy coronary artery calcifications are again noted.  There  is no evidence of thoracic aortic aneurysm.  A large left pleural effusion appear slightly increased from 09/06/2014 chest radiograph.  There is no evidence of right pleural effusion or pericardial effusion.  A 4 x 10 cm left upper lobe mass/paramediastinal mass has slightly increased in size now measuring 4 x 10 cm (image 37), previously 3.6 x 8.7 cm this same level.  Increasing left mid and upper lung consolidation/mass since the prior CT is noted and may represent neoplasm, post treatment change versus infection.  Slightly increased interstitial opacities within the right lung may represent interstitial edema with lymphangitic tumor spread less likely.  There is no evidence of pneumothorax.  A right Port-A-Cath is again identified with tip at the superior cavoatrial junction.  No acute bony abnormalities are noted.  Review of the MIP images confirms the above findings.  IMPRESSION: No evidence of pulmonary emboli.  Large left pleural effusion, appears slightly increased from 09/06/2014 chest radiograph.  Slightly increasing size of known left upper lobe/ paramediastinal neoplasm. Increasing left mid and upper lung consolidations/mass may represent neoplasm, posttreatment change versus infection.  Increasing right lung interstitial opacities -question interstitial edema with lymphangitic  tumor spread less likely.  Cardiomegaly and coronary artery disease.   Electronically Signed   By: Hassan Rowan M.D.   On: 09/10/2014 20:54   US Thoracentesis Asp Pleural Space W/img Guide  09/11/2014   CLINICAL DATA:  History of lung cancer. Recurrent left pleural effusion, shortness of breath. Request diagnostic and therapeutic thoracentesis.  EXAM: ULTRASOUND GUIDED LEFT THORACENTESIS  COMPARISON:  Previous left thoracentesis  PROCEDURE: An ultrasound guided thoracentesis was thoroughly discussed with the patient and questions answered. The benefits, risks, alternatives and complications were also discussed. The patient  understands and wishes to proceed with the procedure. Written consent was obtained.  Ultrasound was performed to localize and mark an adequate pocket of fluid in the left chest. The area was then prepped and draped in the normal sterile fashion. 1% Lidocaine was used for local anesthesia. Under ultrasound guidance a 6 French Safe-T-Centesis catheter was introduced. Thoracentesis was performed. The catheter was removed and a dressing applied.  COMPLICATIONS: None.  FINDINGS: A total of approximately 1.6 L of clear yellow fluid was removed. A fluid sample well assent for laboratory analysis.  IMPRESSION: Successful ultrasound guided left thoracentesis yielding 1.6 L of pleural fluid.  Read by: Ascencion Dike PA-C   Electronically Signed   By: Daryll Brod M.D.   On: 09/11/2014 11:27    Scheduled Meds: . amiodarone  200 mg Oral BID  . antiseptic oral rinse  7 mL Mouth Rinse BID  . aspirin EC  81 mg Oral BID  . atorvastatin  40 mg Oral q1800  . citalopram  20 mg Oral Daily  . docusate sodium  100 mg Oral BID  . finasteride  5 mg Oral Daily  . furosemide  40 mg Oral Daily  . gabapentin  600 mg Oral BID  . guaiFENesin  600 mg Oral BID  . insulin aspart  0-9 Units Subcutaneous 6 times per day  . insulin glargine  5 Units Subcutaneous QHS  . metoprolol tartrate  12.5 mg Oral Daily  . pantoprazole  40 mg Oral QPM  . potassium chloride  40 mEq Oral Daily  . sodium chloride  3 mL Intravenous Q12H  . sodium chloride  3 mL Intravenous Q12H  . sucralfate  1 g Oral QID  . tamsulosin  0.4 mg Oral QHS   Continuous Infusions:   Active Problems:   DM (diabetes mellitus) type II controlled, neurological manifestation   Essential hypertension   Coronary atherosclerosis   Lung cancer, upper lobe   Acute on chronic combined systolic and diastolic congestive heart failure, NYHA class 4   COPD (chronic obstructive pulmonary disease)   PAF (paroxysmal atrial fibrillation)   SOB (shortness of breath)    Diastolic CHF   Pleural effusion    Time spent: 35 minutes. Greater than 50% of this time was spent in direct contact with the patient coordinating care.    Lelon Frohlich  [facility] Pager [phone]  If 7PM-7AM, please contact night-coverage at [email or web address], password [facility] 09/11/2014, 4:52 PM  LOS: 1 day

## 2014-09-11 NOTE — Plan of Care (Signed)
Problem: Phase I Progression Outcomes Goal: Pain controlled with appropriate interventions Outcome: Completed/Met Date Met:  09/11/14     

## 2014-09-11 NOTE — Plan of Care (Signed)
Problem: Phase I Progression Outcomes Goal: Hemodynamically stable Outcome: Completed/Met Date Met:  09/11/14     

## 2014-09-11 NOTE — Plan of Care (Signed)
Problem: Consults Goal: General Medical Patient Education See Patient Education Module for specific education.  Outcome: Completed/Met Date Met:  09/11/14

## 2014-09-11 NOTE — Plan of Care (Signed)
Problem: Phase I Progression Outcomes Goal: Initial discharge plan identified Outcome: Completed/Met Date Met:  09/11/14

## 2014-09-11 NOTE — Plan of Care (Signed)
Problem: Phase I Progression Outcomes Goal: OOB as tolerated unless otherwise ordered Outcome: Progressing     

## 2014-09-12 DIAGNOSIS — E440 Moderate protein-calorie malnutrition: Secondary | ICD-10-CM | POA: Insufficient documentation

## 2014-09-12 DIAGNOSIS — I5043 Acute on chronic combined systolic (congestive) and diastolic (congestive) heart failure: Principal | ICD-10-CM

## 2014-09-12 LAB — GLUCOSE, CAPILLARY
GLUCOSE-CAPILLARY: 189 mg/dL — AB (ref 70–99)
Glucose-Capillary: 197 mg/dL — ABNORMAL HIGH (ref 70–99)
Glucose-Capillary: 158 mg/dL — ABNORMAL HIGH (ref 70–99)
Glucose-Capillary: 128 mg/dL — ABNORMAL HIGH (ref 70–99)

## 2014-09-12 MED ORDER — DSS 100 MG PO CAPS: 100.0000 mg | ORAL_CAPSULE | Freq: Two times a day (BID) | ORAL | Status: AC

## 2014-09-12 MED ORDER — HEPARIN SOD (PORK) LOCK FLUSH 100 UNIT/ML IV SOLN
500.0000 [IU] | INTRAVENOUS | Status: AC | PRN
  Administered 2014-09-12: 500 [IU]

## 2014-09-12 MED ORDER — GLUCERNA SHAKE PO LIQD
237.0000 mL | Freq: Two times a day (BID) | ORAL | Status: DC
  Filled 2014-09-12: qty 237

## 2014-09-12 NOTE — Discharge Summary (Signed)
Physician Discharge Summary  [name] [name]:[id number] DOB: April 18, 1933 DOA: 09/10/2014  PCP: [name], MD  Admit date: 09/10/2014 Discharge date: 09/12/2014  Time spent: 45 minutes  Recommendations for Outpatient Follow-up:  -Will be discharged home today. -We'll follow up with Dr. [name] in the next 10 days to 2 weeks for report on cytology from pleural fluid.  Discharge Diagnoses:  Active Problems:   DM (diabetes mellitus) type II controlled, neurological manifestation   Essential hypertension   Coronary atherosclerosis   Lung cancer, upper lobe   Acute on chronic combined systolic and diastolic congestive heart failure, NYHA class 4   COPD (chronic obstructive pulmonary disease)   PAF (paroxysmal atrial fibrillation)   SOB (shortness of breath)   Diastolic CHF   Pleural effusion   Pleural effusion on left   Malnutrition of moderate degree   Discharge Condition: stable and improved  Filed Weights   09/10/14 2145 09/12/14 0435  Weight: 86.1 kg (189 lb 13.1 oz) 85.412 kg (188 lb 4.8 oz)    History of present illness:  [name] [name] is a 80 y.o. male Presented with  Patient has hx of Left upper lobe lung mass sp chemo therapy and radiation therapy this summer followed by Dr.Sherrill He has a hx. of loculated left pleural effusion that has been tapped in the past by IR In the end of October at that time 1.4L were removed the thought was to consider option for pleurodex catheter if the effusion reccured. Patient is on home oxygen at 4L at baseline. He has been strugling with COPD and CHF both contributing to shortness of breath. He was supposed to see Dr. Benay Spice in office to address progression of his lung mass but started to have gradual worsening of shortness of breath and diminished tolerance to ambulate patient presented to [facility] ER.  CXR showing Partially loculated left-sided pleural effusion. CT angio was done showing no PE, but evidence of Large left  pleural effusion, increasing size of left upper lobe/ paramediastinal neoplasm. Increasing left mid and upper lung consolidations/mass may represent neoplasm, posttreatment change versus infection. Patient denies any cough or fever Hospitalist was called for admission for Reccurent pleural effusion and hx of Lung mass causing dyspnea.   Hospital Course:   Shortness of breath -Has probably multiple contributing factors including worsening left-sided pleural effusion, history of CHF, COPD, history of lung cancer although suspect main player in acute worsening is his pleural effusion. -Is status post left-sided thoracentesis with marked symptomatic improvement.  -Fluid sent for cytology. This will help [place] determine whether placement of a Pleurx catheter may be helpful.  Type 2 diabetes -Fair control  Hypertension -well-controlled  Lung cancer -As per oncology.  COPD -Well-controlled  Chronic combined CHF  -Avoid fluids, continue home medications including Lasix.  Paroxysmal atrial fibrillation -Continue metoprolol and amiodarone. -Patient has been maintained on aspirin, not on chronic anticoagulation given he lives at home and high risk for falls.   Procedures:  none   Consultations:  IR  Discharge Instructions  Discharge Instructions    Diet - low sodium heart healthy    Complete by:  As directed      Increase activity slowly    Complete by:  As directed             Medication List    TAKE these medications        amiodarone 200 MG tablet  Commonly known as:  PACERONE  Take 200 mg by mouth  2 (two) times daily.     aspirin 81 MG tablet  Take 81 mg by mouth 2 (two) times daily.     atorvastatin 40 MG tablet  Commonly known as:  LIPITOR  Take 40 mg by mouth daily.     citalopram 20 MG tablet  Commonly known as:  CELEXA  Take 20 mg by mouth daily.     DSS 100 MG Caps  Take 100 mg by mouth 2 (two) times daily.     feeding supplement (GLUCERNA SHAKE)  Liqd  Take 237 mLs by mouth daily.     finasteride 5 MG tablet  Commonly known as:  PROSCAR  Take 5 mg by mouth daily.     furosemide 40 MG tablet  Commonly known as:  LASIX  Take 40 mg by mouth daily.     gabapentin 600 MG tablet  Commonly known as:  NEURONTIN  Take 600 mg by mouth 2 (two) times daily.     GLUCOSAMINE CHONDR 1500 COMPLX Caps  Take 1 capsule by mouth daily.     insulin aspart 100 UNIT/ML injection  Commonly known as:  novoLOG  Inject 5 Units into the skin 2 (two) times daily.     insulin glargine 100 UNIT/ML injection  Commonly known as:  LANTUS  Inject 0.18 mLs (18 Units total) into the skin at bedtime.     linagliptin 5 MG Tabs tablet  Commonly known as:  TRADJENTA  Take 1 tablet (5 mg total) by mouth daily. With largest meal     metoprolol tartrate 25 MG tablet  Commonly known as:  LOPRESSOR  Take 12.5 mg by mouth daily.     oxyCODONE-acetaminophen 5-325 MG per tablet  Commonly known as:  PERCOCET/ROXICET  Take one tablet by mouth every 4 hours as needed for pain     pantoprazole 40 MG tablet  Commonly known as:  PROTONIX  Take 40 mg by mouth every evening.     Potassium Chloride ER 20 MEQ Tbcr  Take 40 mEq by mouth daily.     sucralfate 1 G tablet  Commonly known as:  CARAFATE  Take 1 tablet (1 g total) by mouth 4 (four) times daily.     tamsulosin 0.4 MG Caps capsule  Commonly known as:  FLOMAX  Take 0.4 mg by mouth daily.       No Known Allergies     Follow-up Information    Follow up with PERINI,MARK A, MD. Schedule an appointment as soon as possible for a visit in 2 weeks.   Specialty:  Internal Medicine   Contact information:   [address] [place] [address] [phone]       Follow up with [facility].   Specialty:  [place]   Why:  HHRN/PT/OT/AIDE   Contact information:   [address] [place] [address] [phone]        The results of significant diagnostics from  this hospitalization (including imaging, microbiology, ancillary and laboratory) are listed below for reference.    Significant Diagnostic Studies: Dg Chest 1 View  09/11/2014   CLINICAL DATA:  80 year old male status post left thoracentesis  EXAM: CHEST - 1 VIEW  COMPARISON:  Prior chest radiograph 09/10/2014  FINDINGS: No evidence of pneumothorax status post left thoracentesis. Overall, the volume of the left pleural effusion has decreased. There is incomplete expansion of the left lung with resultant right-to-left shift of the cardiac and mediastinal structures. Right IJ approach single-lumen power  injectable port catheter. Catheter tip overlies the superior cavoatrial junction. Chronic bronchitic change and interstitial prominence similar compared to prior. Atherosclerotic calcification is present in the transverse aorta. No acute osseous abnormality.  IMPRESSION: 1. No evidence of pneumothorax status post left thoracentesis. 2. Decreased left pleural effusion with incomplete re-expansion of the left lung resulting in persistent right-to-left shift of the cardiac and mediastinal structures.   Electronically Signed   By: Jacqulynn Cadet M.D.   On: 09/11/2014 12:23   Dg Chest 1 View  08/22/2014   CLINICAL DATA:  Post LEFT thoracentesis, LEFT pleural effusion, personal history of diabetes mellitus, coronary artery disease, hypertension, non-Hodgkin's lymphoma, COPD, MI, lung cancer  EXAM: CHEST - 1 VIEW  COMPARISON:  08/21/2014  FINDINGS: RIGHT jugular Port-A-Cath stable tip projecting over SVC.  Enlargement of cardiac silhouette with mitral annular calcification.  Atherosclerotic calcification aorta.  Mediastinal contour stable.  BILATERAL pulmonary infiltrates greatest in LEFT upper lobe.  Decreased LEFT pleural effusion post thoracentesis.  No pneumothorax.  Minimal bibasilar atelectasis.  Diffuse osseous demineralization.  IMPRESSION: No pneumothorax following LEFT thoracentesis.  Persistent  pulmonary infiltrates greatest in LEFT upper lobe; underlying mass/abnormalities in LEFT upper lobe not excluded with this appearance.  Decreased LEFT pleural effusion and minimal bibasilar atelectasis.  Enlargement of cardiac silhouette.   Electronically Signed   By: Lavonia Dana M.D.   On: 08/22/2014 12:11   Dg Chest 2 View  09/10/2014   CLINICAL DATA:  Short of breath. History of left-sided lung cancer. Hypertension. Ex-smoker. ICD10: R 6.02.  EXAM: CHEST  2 VIEW  COMPARISON:  09/06/2014  FINDINGS: A right-sided Port-A-Cath which terminates at the mid to low SVC.  Moderate cardiomegaly. Left-sided pleural fluid is similar with loculation superiorly. Diffuse interstitial thickening. Mild volume loss at the right lung base. Patchy left-sided airspace disease with similar relative left mid lung aeration.  IMPRESSION: No significant change since the prior exam.  Partially loculated left-sided pleural effusion with pulmonary parenchymal opacification throughout the left lung base and left apex. Likely a combination of tumor and treatment effects. A component of interstitial edema cannot be excluded.   Electronically Signed   By: Abigail Miyamoto M.D.   On: 09/10/2014 18:18   Dg Chest 2 View  09/06/2014   CLINICAL DATA:  Left-sided chest discomfort with shortness of breath ; history of lung malignancy.  EXAM: CHEST  2 VIEW  COMPARISON:  Portable chest x-ray of August 22, 2014  FINDINGS: There is new volume loss on the left consistent with further accumulation pleural fluid at the lung base and in the apex. The pulmonary interstitial markings are increased in the left mid lung but are stable. On the right the interstitial markings are mildly increased though stable. The left heart border is obscured. The right heart border is unremarkable. There is dense calcification of the mitral valvular annulus. The pulmonary vascularity is not engorged. The power port appliance tip overlies the midportion of the SVC. The bony  thorax is unremarkable.  IMPRESSION: There is new volume loss on the left most compatible with accumulation of pleural fluid. There is persistent left perihilar increased density consistent with central tumor.   Electronically Signed   By: David  Martinique   On: 09/06/2014 16:59   Dg Chest 2 View  08/21/2014   CLINICAL DATA:  80 year old male with shortness of breath. History of lung cancer.  EXAM: CHEST  2 VIEW  COMPARISON:  Chest x-ray 08/18/2014.  FINDINGS: Moderate left pleural effusion, potentially a partially  loculated given the unusual appearance at the left lung base. Trace right pleural effusion. Cephalization of the pulmonary vasculature with indistinct interstitial markings and diffuse peribronchial cuffing. Mass like opacity in the left hilar and suprahilar region with extensive interstitial opacities throughout the left upper lung, compatible with known of mass with probable postradiation changes. Cardiac silhouette is largely obscured, but appears enlarged. Upper mediastinal contours are partially obscured. Atherosclerosis in the thoracic aorta. Right internal jugular single-lumen power porta cath with tip terminating at the superior cavoatrial junction.  IMPRESSION: 1. Appearance the chest suggests mild congestive failure. 2. Partially loculated moderate left pleural effusion. 3. Known left upper lobe mass with probable surrounding postradiation changes in the left upper lobe. However, the possibility of pneumonia in the left upper lobe is not excluded, and clinical correlation is recommended.   Electronically Signed   By: Vinnie Langton M.D.   On: 08/21/2014 08:56   Dg Chest 2 View  08/18/2014   CLINICAL DATA:  Shortness of breath, left upper lobe lung cancer  EXAM: CHEST  2 VIEW  COMPARISON:  08/02/2014  FINDINGS: There is a moderate left pleural effusion. There is left upper lobe post treatment changes. There are increased interstitial markings in the right lung left upper lobe. Stable  cardiomediastinal silhouette. Right-sided Port-A-Cath with the tip projecting over the SVC. Unremarkable osseous structures.  IMPRESSION: 1. Moderate left pleural effusion. 2. Bilateral interstitial prominence likely reflecting an element of chronic interstitial lung disease with superimposed mild interstitial edema.   Electronically Signed   By: Kathreen Devoid   On: 08/18/2014 15:27   Ct Angio Chest Pe W/cm &/or Wo Cm  09/10/2014   CLINICAL DATA:  80 year old male with increasing shortness of breath. Patient with non-small cell left lung cancer.  EXAM: CT ANGIOGRAPHY CHEST WITH CONTRAST  TECHNIQUE: Multidetector CT imaging of the chest was performed using the standard protocol during bolus administration of intravenous contrast. Multiplanar CT image reconstructions and MIPs were obtained to evaluate the vascular anatomy.  CONTRAST:  133mL OMNIPAQUE IOHEXOL 350 MG/ML SOLN  COMPARISON:  09/10/2014 and prior chest radiographs. 07/11/2014 and prior chest CTs.  FINDINGS: This is a technically satisfactory study.  There is no evidence of acute pulmonary emboli.  Cardiomegaly and heavy coronary artery calcifications are again noted.  There is no evidence of thoracic aortic aneurysm.  A large left pleural effusion appear slightly increased from 09/06/2014 chest radiograph.  There is no evidence of right pleural effusion or pericardial effusion.  A 4 x 10 cm left upper lobe mass/paramediastinal mass has slightly increased in size now measuring 4 x 10 cm (image 37), previously 3.6 x 8.7 cm this same level.  Increasing left mid and upper lung consolidation/mass since the prior CT is noted and may represent neoplasm, post treatment change versus infection.  Slightly increased interstitial opacities within the right lung may represent interstitial edema with lymphangitic tumor spread less likely.  There is no evidence of pneumothorax.  A right Port-A-Cath is again identified with tip at the superior cavoatrial junction.  No  acute bony abnormalities are noted.  Review of the MIP images confirms the above findings.  IMPRESSION: No evidence of pulmonary emboli.  Large left pleural effusion, appears slightly increased from 09/06/2014 chest radiograph.  Slightly increasing size of known left upper lobe/ paramediastinal neoplasm. Increasing left mid and upper lung consolidations/mass may represent neoplasm, posttreatment change versus infection.  Increasing right lung interstitial opacities -question interstitial edema with lymphangitic tumor spread less likely.  Cardiomegaly  and coronary artery disease.   Electronically Signed   By: Hassan Rowan M.D.   On: 09/10/2014 20:54   US Thoracentesis Asp Pleural Space W/img Guide  09/11/2014   CLINICAL DATA:  History of lung cancer. Recurrent left pleural effusion, shortness of breath. Request diagnostic and therapeutic thoracentesis.  EXAM: ULTRASOUND GUIDED LEFT THORACENTESIS  COMPARISON:  Previous left thoracentesis  PROCEDURE: An ultrasound guided thoracentesis was thoroughly discussed with the patient and questions answered. The benefits, risks, alternatives and complications were also discussed. The patient understands and wishes to proceed with the procedure. Written consent was obtained.  Ultrasound was performed to localize and mark an adequate pocket of fluid in the left chest. The area was then prepped and draped in the normal sterile fashion. 1% Lidocaine was used for local anesthesia. Under ultrasound guidance a 6 French Safe-T-Centesis catheter was introduced. Thoracentesis was performed. The catheter was removed and a dressing applied.  COMPLICATIONS: None.  FINDINGS: A total of approximately 1.6 L of clear yellow fluid was removed. A fluid sample well assent for laboratory analysis.  IMPRESSION: Successful ultrasound guided left thoracentesis yielding 1.6 L of pleural fluid.  Read by: Ascencion Dike PA-C   Electronically Signed   By: Daryll Brod M.D.   On: 09/11/2014 11:27   US  Thoracentesis Asp Pleural Space W/img Guide  08/22/2014   CLINICAL DATA:  Shortness of breath, recurrent left pleural effusion. Request therapeutic thoracentesis.  EXAM: ULTRASOUND GUIDED LEFT THORACENTESIS  COMPARISON:  Previous thoracentesis  PROCEDURE: An ultrasound guided thoracentesis was thoroughly discussed with the patient and questions answered. The benefits, risks, alternatives and complications were also discussed. The patient understands and wishes to proceed with the procedure. Written consent was obtained.  Ultrasound was performed to localize and mark an adequate pocket of fluid in the left chest. The area was then prepped and draped in the normal sterile fashion. 1% Lidocaine was used for local anesthesia. Under ultrasound guidance a 6 French Safe-T-Centesis catheter was introduced. Thoracentesis was performed. The catheter was removed and a dressing applied.  COMPLICATIONS: None  FINDINGS: A total of approximately 1.4 L of clear, dark yellow fluid was removed. A fluid sample was notsent for laboratory analysis.  IMPRESSION: Successful ultrasound guided left thoracentesis yielding 1.4 L of pleural fluid.  Read by: Ascencion Dike PA-C   Electronically Signed   By: Aletta Edouard M.D.   On: 08/22/2014 11:48    Microbiology: Recent Results (from the past 240 hour(s))  Fungus Culture with Smear     Status: None (Preliminary result)   Collection Time: 09/11/14 11:12 AM  Result Value Ref Range Status   Specimen Description PLEURAL LEFT  Final   Special Requests NONE  Final   Fungal Smear   Final    NO YEAST OR FUNGAL ELEMENTS SEEN Performed at [facility]    Culture   Final    CULTURE IN PROGRESS FOR FOUR WEEKS Performed at [facility]    Report Status PENDING  Incomplete  Body fluid culture     Status: None (Preliminary result)   Collection Time: 09/11/14 11:12 AM  Result Value Ref Range Status   Specimen Description PLEURAL LEFT  Final   Special Requests NONE   Final   Gram Stain   Final    FEW WBC PRESENT,BOTH PMN AND MONONUCLEAR NO ORGANISMS SEEN Performed at [facility]    Culture NO GROWTH Performed at [facility]   Final   Report Status PENDING  Incomplete  AFB culture with smear     Status: None (Preliminary result)   Collection Time: 09/11/14 11:12 AM  Result Value Ref Range Status   Specimen Description PLEURAL LEFT  Final   Special Requests NONE  Final   Acid Fast Smear   Final    NO ACID FAST BACILLI SEEN Performed at [facility]    Culture   Final    CULTURE WILL BE EXAMINED FOR 6 WEEKS BEFORE ISSUING A FINAL REPORT Performed at [facility]    Report Status PENDING  Incomplete     Labs: Basic Metabolic Panel:  Recent Labs Lab 09/10/14 1744 09/11/14 0617  NA 144 140  K 3.7 3.7  CL 104 100  CO2 27 29  GLUCOSE 174* 116*  BUN 16 16  CREATININE 0.81 0.88  CALCIUM 8.9 9.1  MG  --  1.2*  PHOS  --  4.3   Liver Function Tests:  Recent Labs Lab 09/10/14 1744 09/11/14 0617  AST 17 17  ALT 10 9  ALKPHOS 68 67  BILITOT 1.0 1.1  PROT 6.7 6.6  ALBUMIN 3.0* 2.9*   No results for input(s): LIPASE, AMYLASE in the last 168 hours. No results for input(s): AMMONIA in the last 168 hours. CBC:  Recent Labs Lab 09/10/14 1744 09/11/14 0617  WBC 4.6 4.1  NEUTROABS 2.9  --   HGB 10.2* 10.2*  HCT 32.0* 31.8*  MCV 91.7 91.9  PLT 139* 126*   Cardiac Enzymes:  Recent Labs Lab 09/10/14 1744 09/11/14 0018 09/11/14 0617 09/11/14 1230  TROPONINI <0.30 <0.30 <0.30 <0.30   BNP: BNP (last 3 results)  Recent Labs  08/18/14 1512 08/22/14 0535 09/10/14 1744  PROBNP 22975.0* 11824.0* 11604.0*   CBG:  Recent Labs Lab 09/11/14 2006 09/11/14 2357 09/12/14 0425 09/12/14 0756 09/12/14 1206  GLUCAP 206* 197* 158* 128* 189*       Signed:  Lelon Frohlich  [facility] Pager: [phone] 09/12/2014, 3:44 PM

## 2014-09-12 NOTE — Progress Notes (Signed)
Went over discharge information with pt and grand daughter. Went over what medications that still need to be taken for tonight.  Gave HF booklet and went over HF education.  Answered all questions.  VSS.  Port deaccessed by IV team.  [facility] Oxygen to travel with since his portable tank was empty.  Pt. Wheeled out by NT.

## 2014-09-12 NOTE — Progress Notes (Signed)
Pharmacist Heart Failure Core Measure Documentation  Assessment: [name] [name] has an EF documented as 25-30% on 07/27/2014 by ECHO.  Rationale: Heart failure patients with left ventricular systolic dysfunction (LVSD) and an EF < 40% should be prescribed an angiotensin converting enzyme inhibitor (ACEI) or angiotensin receptor blocker (ARB) at discharge unless a contraindication is documented in the medical record.  This patient is not currently on an ACEI or ARB for HF.  This note is being placed in the record in order to provide documentation that a contraindication to the use of these agents is present for this encounter.  ACE Inhibitor or Angiotensin Receptor Blocker is contraindicated (specify all that apply)  []   ACEI allergy AND ARB allergy []   Angioedema []   Moderate or severe aortic stenosis []   Hyperkalemia [x]   Hypotension []   Renal artery stenosis []   Worsening renal function, preexisting renal disease or dysfunction   Clovis Riley 09/12/2014 2:53 PM

## 2014-09-12 NOTE — Progress Notes (Signed)
INITIAL NUTRITION ASSESSMENT  DOCUMENTATION CODES Per approved criteria  -Non-severe (moderate) malnutrition in the context of chronic illness  Pt meets criteria for moderate MALNUTRITION in the context of chronic illness as evidenced by 7.3% body weight loss in < 3 months, mild muscle wasting and subcutaneous fat loss.   INTERVENTION: -Recommend Glucerna Shake po BID, each supplement provides 220 kcal and 10 grams of protein -Recommend 8PM nourishment of graham crackers w/peanut butter  -Encouraged small frequent snacks every 2-3 hours for weight maintenance -RD to continue to monitor   NUTRITION DIAGNOSIS: Inadequate oral intake related to early satiety as evidenced by 15 lb weight loss in < 3 months, muscle wasting and fat loss .   Goal: Pt to meet >/= 90% of their estimated nutrition needs    Monitor:  Total protein/energy intake, labs, weights, education needs  Reason for Assessment: MST/Consult to Assess  79 y.o. male  Admitting Dx: <principal problem not specified>  ASSESSMENT: Patient has hx of Left upper lobe lung mass sp chemo therapy and radiation therapy this summer followed by Dr.Sherrill He has a hx. of loculated left pleural effusion that has been tapped in the past by IR In the end of October at that time 1.4L were removed the thought was to consider option for pleurodex catheter if the effusion reccured. Patient is on home oxygen at 4L at baseline. He has been strugling with COPD and CHF both contributing to shortness of breath  -Pt endorsed an unintentional wt loss of 15 lb since beginning of October (7% body weight loss, severe for time frame) -Diet recall indicates pt consuming three small meals, noted portion sizes have decreased d/t feelings of early satiety. Breakfast consists of eggs or breakfast sandwich, lunch and dinner are usually pork chops/hamburger steak/chicken with two vegetables or potatoes -Will drink Boost or Glucerna occasionally. Encouraged  pt to consume supplement twice daily, with nighttime snack -Eating well during admit, > 75% of meals Nutrition Focused Physical Exam:  Subcutaneous Fat:  Orbital Region: WDL Upper Arm Region: mild wasting Thoracic and Lumbar Region: n/a  Muscle:  Temple Region: WDL Clavicle Bone Region: mild wasting Clavicle and Acromion Bone Region:WDL Scapular Bone Region: mild wasting Dorsal Hand: WDL Patellar Region: WDL Anterior Thigh Region: WDL Posterior Calf Region: WDL  Edema: + 1 RLE edema, +1 LLE edema    Height: Ht Readings from Last 1 Encounters:  09/10/14 5\' 11"  (1.803 m)    Weight: Wt Readings from Last 1 Encounters:  09/12/14 188 lb 4.8 oz (85.412 kg)    Ideal Body Weight: 172 lb  % Ideal Body Weight: 109%  Wt Readings from Last 10 Encounters:  09/12/14 188 lb 4.8 oz (85.412 kg)  09/06/14 195 lb 1.6 oz (88.497 kg)  08/23/14 193 lb 9 oz (87.8 kg)  08/12/14 203 lb (92.08 kg)  08/05/14 202 lb (91.627 kg)  08/02/14 193 lb 12.6 oz (87.9 kg)  07/14/14 217 lb (98.431 kg)  07/12/14 220 lb 3.2 oz (99.882 kg)  07/12/14 213 lb (96.616 kg)  07/06/14 217 lb (98.431 kg)    Usual Body Weight: 240 lb in 02/2014  % Usual Body Weight: 78%  BMI:  Body mass index is 26.27 kg/(m^2).  Estimated Nutritional Needs: Kcal: 2000-2200 Protein: 95-105 gram Fluid: >/=1900 ml daily  Skin: WDL  Diet Order: Diet Carb Modified  EDUCATION NEEDS: -Education needs addressed   Intake/Output Summary (Last 24 hours) at 09/12/14 1148 Last data filed at 09/11/14 1810  Gross per 24 hour  Intake    600 ml  Output    150 ml  Net    450 ml    Last BM: 11/13   Labs:   Recent Labs Lab 09/10/14 1744 09/11/14 0617  NA 144 140  K 3.7 3.7  CL 104 100  CO2 27 29  BUN 16 16  CREATININE 0.81 0.88  CALCIUM 8.9 9.1  MG  --  1.2*  PHOS  --  4.3  GLUCOSE 174* 116*    CBG (last 3)   Recent Labs  09/11/14 2357 09/12/14 0425 09/12/14 0756  GLUCAP 197* 158* 128*     Scheduled Meds: . amiodarone  200 mg Oral BID  . antiseptic oral rinse  7 mL Mouth Rinse BID  . aspirin EC  81 mg Oral BID  . atorvastatin  40 mg Oral q1800  . citalopram  20 mg Oral Daily  . docusate sodium  100 mg Oral BID  . feeding supplement (GLUCERNA SHAKE)  237 mL Oral BID BM  . finasteride  5 mg Oral Daily  . furosemide  40 mg Oral Daily  . gabapentin  600 mg Oral BID  . guaiFENesin  600 mg Oral BID  . insulin aspart  0-9 Units Subcutaneous 6 times per day  . insulin glargine  5 Units Subcutaneous QHS  . metoprolol tartrate  12.5 mg Oral Daily  . pantoprazole  40 mg Oral QPM  . potassium chloride  40 mEq Oral Daily  . sodium chloride  3 mL Intravenous Q12H  . sodium chloride  3 mL Intravenous Q12H  . sucralfate  1 g Oral QID  . tamsulosin  0.4 mg Oral QHS    Continuous Infusions:   Past Medical History  Diagnosis Date  . Peripheral arterial disease     s/p Ao Bifem Bypass  . Diabetes mellitus without complication   . CAD (coronary artery disease)     By  CT Scan  . Hyperlipidemia   . Hypertension   . Non Hodgkin's lymphoma 2000, 2010  . Peripheral neuropathy   . COPD (chronic obstructive pulmonary disease)   . History of lower GI bleeding   . Substance abuse   . Lung cancer, upper lobe     left lung  . AAA (abdominal aortic aneurysm)   . MI (myocardial infarction) 04/10/14    Past Surgical History  Procedure Laterality Date  . Fracture surgery  2009    Left ankle  . Pr vein bypass graft,aorto-fem-pop  2005    Right common femoral to BK popliteal BPG  . Pr vein bypass graft,aorto-fem-pop  01-27-05    Revision of Right fem-pop BPG  . Cardiac catheterization  1990's  . Video bronchoscopy Bilateral 03/14/2014    Procedure: VIDEO BRONCHOSCOPY WITH FLUORO;  Surgeon: Rigoberto Noel, MD;  Location: [facility];  Service: Cardiopulmonary;  Laterality: Bilateral;  . Nm myoview ltd  11/2013    Lexiscan.  EF 60%, small fixed inferoapical defect - ? artifact  .  Transthoracic echocardiogram  11/2013    EF 60-65%, Gr 1 DD, Mod AS, Sever MAC - no MS.       Atlee Abide MS RD LDN Clinical Dietitian [phone]

## 2014-09-12 NOTE — Plan of Care (Signed)
Problem: Phase I Progression Outcomes Goal: Other Phase I Outcomes/Goals Outcome: Not Applicable Date Met:  70/01/74  Problem: Phase II Progression Outcomes Goal: Progress activity as tolerated unless otherwise ordered Outcome: Completed/Met Date Met:  09/12/14 Goal: Vital signs remain stable Outcome: Completed/Met Date Met:  09/12/14 Goal: IV changed to normal saline lock Outcome: Completed/Met Date Met:  09/12/14 Goal: Obtain order to discontinue catheter if appropriate Outcome: Not Applicable Date Met:  94/49/67 Goal: Other Phase II Outcomes/Goals Outcome: Not Applicable Date Met:  59/16/38

## 2014-09-12 NOTE — Care Management Note (Signed)
    Page 1 of 1   09/12/2014     3:41:45 PM CARE MANAGEMENT NOTE 09/12/2014  Patient:  [name] [name], [name] [name]   Account Number:  [id number]  Date Initiated:  09/12/2014  Documentation initiated by:  Dessa Phi  Subjective/Objective Assessment:   80 Y/O M ADMITTED W/SOB.PLEURAL EFFUSION.READMIT 10/22-10/27/15     Action/Plan:   FROM HOME.[place] CARE-HHRN/PT/OT/AIDE.HAS HOME 02-[facility],RW.   Anticipated DC Date:  09/12/2014   Anticipated DC Plan:  [place]  CM consult      [facility] Choice  Resumption Of Svcs/PTA Provider   Choice offered to / List presented to:  C-1 Patient        [place] arranged  HH-1 RN  [place] agency  [place]   Status of service:  Completed, signed off Medicare Important Message given?   (If response is "NO", the following Medicare IM given date fields will be blank) Date Medicare IM given:   Medicare IM given by:   Date Additional Medicare IM given:   Additional Medicare IM given by:    Discharge Disposition:  [place]  Per UR Regulation:  Reviewed for med. necessity/level of care/duration of stay  If discussed at [place] of Stay Meetings, dates discussed:    Comments:  09/12/14 Dietra Stokely RN,BSN NCM [place] NOTIFIED [place] FAXED INFO PER [facility]SH[facility] DME REP [place] 02 TRAVEL NEEDED-PATIENT'S TANK IN RM EMPTY.PATIENT WILL GET 3N1 ON OWN.NO FURTHER D/C NEEDS.

## 2014-09-15 ENCOUNTER — Ambulatory Visit (INDEPENDENT_AMBULATORY_CARE_PROVIDER_SITE_OTHER): Payer: Medicare Other | Admitting: Cardiovascular Disease

## 2014-09-15 ENCOUNTER — Encounter: Payer: Self-pay | Admitting: Cardiovascular Disease

## 2014-09-15 VITALS — BP 128/60 | HR 78 | Ht 71.5 in | Wt 189.9 lb

## 2014-09-15 DIAGNOSIS — I25118 Atherosclerotic heart disease of native coronary artery with other forms of angina pectoris: Secondary | ICD-10-CM

## 2014-09-15 DIAGNOSIS — I10 Essential (primary) hypertension: Secondary | ICD-10-CM

## 2014-09-15 DIAGNOSIS — C3492 Malignant neoplasm of unspecified part of left bronchus or lung: Secondary | ICD-10-CM

## 2014-09-15 DIAGNOSIS — I480 Paroxysmal atrial fibrillation: Secondary | ICD-10-CM

## 2014-09-15 LAB — BODY FLUID CULTURE: Culture: NO GROWTH

## 2014-09-15 NOTE — Patient Instructions (Signed)
STOP your STATIN (cholesterol) drug.  Dr. Sallyanne Kuster recommends that you schedule a follow-up appointment in: 6 months.

## 2014-09-15 NOTE — Progress Notes (Signed)
Patient ID: [name] [name], male   DOB: [birth year], 80 y.o.   MRN: [id number]     Reason for office visit CAD, CHF, hypertension, PVD  [name] returns for routine followup. He continues to have a remarkably positive outlook despite poor response to treatment for lung cancer. Chemotherapy has been discontinued since it has not stopped disease progression. He was hospitalized last week for recurrent left pleural effusion that required thoracentesis (recently he has required fluid removal about every 2 weeks). There may have been a component of acute on chronic heart failure contributing to his dyspnea as well. He has persistent hoarseness and a rather whispering bitonal quality to his voice. He denies angina pectoris.  He had exertional chest pain that seems to respond to nitroglycerin, with fairly benign findings on nuclear scintigraphy and was receiving medical therapy. Catheterization was performed on 04/10/2014 when he presented with acute heart failure. He was found to have 70% ostial left main and 80% distal left main stenosis and lengthy calcified 50% proximal LAD stenosis as well as extensive stenoses in the mid right and distal right coronary artery. Because of the complexity of disease and the lung cancer diagnosis, he was continued on medical therapy. He developed brief paroxysmal atrial fibrillation with rapid ventricular response while in the hospital that resolved spontaneously. He was started on metoprolol but it was felt that anticoagulation was not indicated. He continues on aspirin. Heart failure has been well managed with a low dose of diuretic. His EKG shows a nonspecific intraventricular conduction delay, not quite a left bundle branch block.  He has well-established PAD with previous right femoral-popliteal bypass procedures. He has treated hypertension and hyperlipidemia as well as COPD. 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 He underwent Taxol/carboplatinum chemotherapy and radiation therapy.    No Known Allergies  Current Outpatient Prescriptions  Medication Sig Dispense Refill  . amiodarone (PACERONE) 200 MG tablet Take 200 mg by mouth 2 (two) times daily.    Marland Kitchen aspirin 81 MG tablet Take 81 mg by mouth 2 (two) times daily.     . citalopram (CELEXA) 20 MG tablet Take 20 mg by mouth daily.    Marland Kitchen docusate sodium 100 MG CAPS Take 100 mg by mouth 2 (two) times daily. 10 capsule 0  . feeding supplement, GLUCERNA SHAKE, (GLUCERNA SHAKE) LIQD Take 237 mLs by mouth daily.  0  . finasteride (PROSCAR) 5 MG tablet Take 5 mg by mouth daily.    . furosemide (LASIX) 40 MG tablet Take 40 mg by mouth daily.    Marland Kitchen gabapentin (NEURONTIN) 600 MG tablet Take 600 mg by mouth 2 (two) times daily.     . Glucosamine-Chondroit-Vit C-Mn (GLUCOSAMINE CHONDR 1500 COMPLX) CAPS Take 1 capsule by mouth daily.    . insulin aspart (NOVOLOG) 100 UNIT/ML injection Inject 5 Units into the skin 2 (two) times daily.    . insulin glargine (LANTUS) 100 UNIT/ML injection Inject 0.18 mLs (18 Units total) into the skin at bedtime. (Patient taking differently: Inject 10 Units into the skin at bedtime. )    . linagliptin (TRADJENTA) 5 MG TABS tablet Take 1 tablet (5 mg total) by mouth daily. With largest meal 30 tablet 3  . metoprolol tartrate (LOPRESSOR) 25 MG tablet Take 12.5 mg by mouth daily.    . Nitroglycerin 400 MCG/SPRAY AERS Place 1  spray under the tongue every 5 (five) minutes x 3 doses as needed.    Marland Kitchen oxyCODONE-acetaminophen (PERCOCET/ROXICET) 5-325 MG per tablet Take one tablet by mouth every 4 hours as needed for pain 180 tablet 0  . pantoprazole (PROTONIX) 40 MG tablet Take 40 mg by mouth every evening.    . potassium chloride 20 MEQ TBCR Take 40 mEq by mouth daily. 30 tablet 3  . sucralfate (CARAFATE) 1 G tablet Take 1 tablet (1 g  total) by mouth 4 (four) times daily. 30 tablet 0  . Tamsulosin HCl (FLOMAX) 0.4 MG CAPS Take 0.4 mg by mouth daily.     No current facility-administered medications for this visit.   Facility-Administered Medications Ordered in Other Visits  Medication Dose Route Frequency Provider Last Rate Last Dose  . 0.9 %  sodium chloride infusion   Intravenous Once Drue Second, NP        Past Medical History  Diagnosis Date  . Peripheral arterial disease     s/p Ao Bifem Bypass  . Diabetes mellitus without complication   . CAD (coronary artery disease)     By  CT Scan  . Hyperlipidemia   . Hypertension   . Non Hodgkin's lymphoma 2000, 2010  . Peripheral neuropathy   . COPD (chronic obstructive pulmonary disease)   . History of lower GI bleeding   . Substance abuse   . Lung cancer, upper lobe     left lung  . AAA (abdominal aortic aneurysm)   . MI (myocardial infarction) 04/10/14    Past Surgical History  Procedure Laterality Date  . Fracture surgery  2009    Left ankle  . Pr vein bypass graft,aorto-fem-pop  2005    Right common femoral to BK popliteal BPG  . Pr vein bypass graft,aorto-fem-pop  01-27-05    Revision of Right fem-pop BPG  . Cardiac catheterization  1990's  . Video bronchoscopy Bilateral 03/14/2014    Procedure: VIDEO BRONCHOSCOPY WITH FLUORO;  Surgeon: Rigoberto Noel, MD;  Location: [facility];  Service: Cardiopulmonary;  Laterality: Bilateral;  . Nm myoview ltd  11/2013    Lexiscan.  EF 60%, small fixed inferoapical defect - ? artifact  . Transthoracic echocardiogram  11/2013    EF 60-65%, Gr 1 DD, Mod AS, Sever MAC - no MS.       Family History  Problem Relation Age of Onset  . Cancer Mother     Colon  . Heart attack Mother   . Stroke Father   . Hyperlipidemia Father   . Hyperlipidemia Sister   . Hyperlipidemia Brother   . Hyperlipidemia Daughter   . Hypertension Son     History   Social History  . Marital Status: Divorced    Spouse Name: N/A     Number of Children: 2  . Years of Education: N/A   Occupational History  . retired Administrator    Social History Main Topics  . Smoking status: Former Smoker -- 1.00 packs/day for 50 years    Types: Cigarettes    Quit date: 10/29/2003  . Smokeless tobacco: Never Used  . Alcohol Use: No  . Drug Use: No  . Sexual Activity: No   Other Topics Concern  . Not on file   Social History Narrative    Review of systems: Hoarseness. Functional class II-III exertional dyspnea, atypical chest discomfort The patient specifically denies dyspnea at rest, orthopnea, paroxysmal nocturnal dyspnea, syncope, palpitations, focal neurological deficits, intermittent claudication, lower extremity edema,  unexplained weight gain, cough, hemoptysis or wheezing.  The patient also denies abdominal pain, nausea, vomiting, dysphagia, diarrhea, constipation, polyuria, polydipsia, dysuria, hematuria, frequency, urgency, abnormal bleeding or bruising, fever, chills, unexpected weight changes, mood swings, change in skin or hair texture, change in voice quality, auditory or visual problems, allergic reactions or rashes, new musculoskeletal complaints other than usual "aches and pains".  PHYSICAL EXAM BP 128/60 mmHg  Pulse 78  Ht 5' 11.5" (1.816 m)  Wt 189 lb 14.4 oz (86.138 kg)  BMI 26.12 kg/m2 General: Alert, oriented x3, no distress  Head: no evidence of trauma, PERRL, EOMI, no exophtalmos or lid lag, no myxedema, no xanthelasma; normal ears, nose and oropharynx  Neck: normal jugular venous pulsations and no hepatojugular reflux; brisk carotid pulses without delay and no carotid bruits  Chest: Diminished breath sounds at the left upper lung, localized wheezing over the left posterior lung field., no signs of consolidation by percussion or palpation, normal fremitus, symmetrical and full respiratory excursions  Cardiovascular: normal position and quality of the apical impulse, regular rhythm, normal first and  second heart sounds, no rubs or gallops, 3/6 holosystolic murmur at apex, radiating towards base, but especially towards axilla.  Abdomen: no tenderness or distention, no masses by palpation, no abnormal pulsatility or arterial bruits, normal bowel sounds, no hepatosplenomegaly  Extremities: no clubbing, cyanosis or edema; 2+ radial, ulnar and brachial pulses bilaterally; 2+ right femoral, posterior tibial and dorsalis pedis pulses; 2+ left femoral, posterior tibial and dorsalis pedis pulses; no subclavian or femoral bruits  Neurological: grossly nonfocal  unexplained weight gain, cough, hemoptysis or wheezing.   EKG: Sinus rhythm, atypical left bundle branch block, inverted T waves in 1 and aVL, unchanged   Lipid Panel     Component Value Date/Time   CHOL 126 04/13/2014 0315   TRIG 157* 04/13/2014 0315   HDL 72 04/13/2014 0315   CHOLHDL 1.8 04/13/2014 0315   VLDL 31 04/13/2014 0315   LDLCALC 23 04/13/2014 0315    BMET    Component Value Date/Time   NA 140 09/11/2014 0617   NA 132* 07/12/2014 1351   K 3.7 09/11/2014 0617   K 3.8 07/12/2014 1351   CL 100 09/11/2014 0617   CO2 29 09/11/2014 0617   CO2 27 07/12/2014 1351   GLUCOSE 116* 09/11/2014 0617   GLUCOSE 65* 07/12/2014 1351   BUN 16 09/11/2014 0617   BUN 35.7* 07/12/2014 1351   CREATININE 0.88 09/11/2014 0617   CREATININE 1.00 07/15/2014 0530   CREATININE 1.2 07/12/2014 1351   CALCIUM 9.1 09/11/2014 0617   CALCIUM 9.0 07/12/2014 1351   GFRNONAA 80* 09/11/2014 0617   GFRAA >90 09/11/2014 0617     ASSESSMENT PLAN  Mr. O'Neil has progression of incurable squamous cell carcinoma of the left lung with recurrent symptomatic left pleural effusion and fairly convincing evidence of recurrent left laryngeal nerve palsy. He has not responded to chemotherapy and radiation, which have now been discontinued.  I think the major cause for his pleural effusion is the cancer, not congestive heart failure. On physical exam there  are no other findings to suggest heart failure today. He has been steadily losing weight as a consequence of his malignancy and it is very hard to say what his "dry weight" is at this point.  The focus of therapy should be on palliation of symptoms, especially dyspnea. I would favor leaving in a drainage catheter for his cancer. Diuretics should be continued. I see little purpose in continuing his  statin, despite the severity of his coronary problems. His LDL cholesterol was only 23. He was poorly tolerant of atrial fibrillation and I would continue amiodarone. His angina is well controlled on the current medications. If he continues to lose weight his blood pressure may become low and we may have to stop his beta blocker. At that point Ranexa might be a consideration if he has angina pectoris.  I encouraged him to discuss home hospice at his upcoming oncology appointment.  Meds ordered this encounter  Medications  . Nitroglycerin 400 MCG/SPRAY AERS    Sig: Place 1 spray under the tongue every 5 (five) minutes x 3 doses as needed.    Holli Humbles, MD, [place] [phone] office [phone] pager

## 2014-09-23 ENCOUNTER — Telehealth: Payer: Self-pay | Admitting: Oncology

## 2014-09-23 ENCOUNTER — Ambulatory Visit (HOSPITAL_BASED_OUTPATIENT_CLINIC_OR_DEPARTMENT_OTHER): Payer: Medicare Other | Admitting: Nurse Practitioner

## 2014-09-23 ENCOUNTER — Ambulatory Visit (HOSPITAL_COMMUNITY)
Admission: RE | Admit: 2014-09-23 | Discharge: 2014-09-23 | Disposition: A | Discharge status: Home / Self Care | Payer: Medicare Other | Source: Ambulatory Visit | Attending: Nurse Practitioner | Admitting: Nurse Practitioner

## 2014-09-23 VITALS — BP 97/56 | HR 69 | Temp 97.6°F | Resp 17 | Ht 71.0 in | Wt 191.0 lb

## 2014-09-23 DIAGNOSIS — C3492 Malignant neoplasm of unspecified part of left bronchus or lung: Secondary | ICD-10-CM

## 2014-09-23 DIAGNOSIS — C3490 Malignant neoplasm of unspecified part of unspecified bronchus or lung: Secondary | ICD-10-CM | POA: Insufficient documentation

## 2014-09-23 DIAGNOSIS — J918 Pleural effusion in other conditions classified elsewhere: Secondary | ICD-10-CM

## 2014-09-23 DIAGNOSIS — J90 Pleural effusion, not elsewhere classified: Secondary | ICD-10-CM | POA: Diagnosis present

## 2014-09-23 DIAGNOSIS — C3412 Malignant neoplasm of upper lobe, left bronchus or lung: Secondary | ICD-10-CM

## 2014-09-23 NOTE — Telephone Encounter (Signed)
[facility] PT APPT SCHEDULE FOR DEC INCLUDING APPT IN IR 09/27/14 FOR PLEURX CATHETER. PT TO ARRIVE @ [facility] 11:30AM

## 2014-09-23 NOTE — Progress Notes (Addendum)
[place] OFFICE PROGRESS NOTE   Diagnosis:  Lung cancer  INTERVAL HISTORY:   Mr. Briseno returns as scheduled. He was hospitalized 09/10/2014 with shortness of breath. Chest CT showed a large left pleural effusion and slight increase in the size of the left upper lobe/paramediastinal mass. Cytology on the pleural fluid showed reactive mesothelial cells. He was discharged on 09/12/2014.  He reports feeling better following the thoracentesis procedure. At present he has dyspnea on exertion. He is utilizing supplemental oxygen at 4 L per minute. He notes increased hoarseness. He has a cough. He denies fever. No pain.  Objective:  Vital signs in last 24 hours:  Blood pressure 97/56, pulse 69, temperature 97.6 F (36.4 C), temperature source Oral, resp. rate 17, height 5\' 11"  (1.803 m), weight 191 lb (86.637 kg), SpO2 95 %.    HEENT: no thrush. Resp: breath sounds diminished left lower lung field. No respiratory distress. Cardio: regular rate and rhythm. GI: abdomen soft and nontender. No hepatomegaly. Vascular: minimal lower leg edema bilaterally. Neuro: alert and oriented.  Port-A-Cath without erythema.    Lab Results:  Lab Results  Component Value Date   WBC 4.1 09/11/2014   HGB 10.2* 09/11/2014   HCT 31.8* 09/11/2014   MCV 91.9 09/11/2014   PLT 126* 09/11/2014   NEUTROABS 2.9 09/10/2014    Imaging:  Dg Chest 2 View  09/23/2014   CLINICAL DATA:  Lung cancer.  Follow-up pleural effusions.  EXAM: CHEST  2 VIEW  COMPARISON:  09/11/2014 and 09/10/2014  FINDINGS: Right IJ Port-A-Cath unchanged. Lungs are adequately inflated with continued evidence of a moderate size left pleural effusion with less fluid over the apex. Persistent right hilar/suprahilar opacification without significant change. Remainder of the exam is unchanged.  IMPRESSION: Moderate size left pleural effusion with less fluid tracking to the apex. Persistent moderate opacification of the left  hilum/suprahilar region.  Right IJ Port-A-Cath unchanged.   Electronically Signed   By: Marin Olp M.D.   On: 09/23/2014 12:25    Medications: I have reviewed the patient's current medications.  Assessment/Plan: 1. 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   Initiation of chest radiation 04/05/2014. Radiation completed 05/30/2014.   Initiation of weekly Taxol/carboplatin chemotherapy 04/18/2014. Final weekly Taxol/carboplatin given 05/30/2014.  Restaging CT 07/11/2014 with a decrease in the left upper lobe mass, new bilateral pleural effusions  Chest CT 09/10/2014 showed a large left pleural effusion and slight increase in the size of the left upper lobe/paramediastinal mass. Cytology on the pleural fluid showed reactive mesothelial cells. 2. Admission 04/10/2014 with acute CHF and acute coronary syndrome, status post a cardiac catheterization 04/10/2014, clinical improvement with medical therapy.  3. COPD  4. Chronic renal failure  5. Diabetes  6. Neuropathy  7. History of non-Hodgkin's lymphoma  8. Atrial fibrillation.  9. Status post evaluation in the emergency department 05/13/2014 for chest pain. Question radiation esophagitis.  10. Oral candidiasis. He completed a 5 day course of Diflucan.  11. History of thrombocytopenia secondary to chemotherapy. The carboplatin was dose reduced on 05/30/2014.  12. 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 Progressive. 13. Hospitalization 07/23/2014 through 08/02/2014 with CHF. 14. Hospitalization 08/18/2014 through 08/23/2014 with CHF. 15. Bilateral pleural effusions on chest CT 07/11/2014. Status post thoracentesis 08/02/2014 and 08/22/2014. It does not appear fluid was sent for cytology.  Status post thoracentesis  09/11/2014. Pathology showed reactive mesothelial cells.   Disposition: Mr. Briski appears unchanged. He has a recurrent left pleural effusion which we suspect is likely malignant. We discussed placement of a Pleurx catheter. He is in agreement. We made a referral to interventional radiology at [facility].  We also discussed a trial of systemic therapy with Nivolumab. Potential toxicities were reviewed. He will return for a followup visit in 2 weeks for additional discussion.  Patient seen with Dr. Benay Spice. 25 minutes were spent face-to-face at today's visit with the majority of the time involved in counseling/coordination of care.    Ned Card ANP/GNP-BC   09/23/2014  2:50 PM  This was a shared visit with Ned Card. [name] reports significant relief from the thoracentesis on 09/11/2014. I reviewed the recent CT and today's chest x-ray. The pleural fluid appears to be reaccumulating.  I discussed treatment options with [name] and his son.  I feel it is very likely the effusion is malignant. We will refer him to interventional radiology for placement of a Pleurx catheter.  We also discussed Nivolumab therapy. We will consider beginning Nivolumab after the Pleurx is removed.  We decided to hold on a hospice referral for now. He would like to consider systemic treatment of the non-small cell lung cancer.  Julieanne Manson, M.D.

## 2014-09-26 ENCOUNTER — Other Ambulatory Visit: Payer: Self-pay | Admitting: Radiology

## 2014-09-26 ENCOUNTER — Encounter: Payer: Self-pay | Admitting: *Deleted

## 2014-09-26 ENCOUNTER — Telehealth: Payer: Self-pay | Admitting: *Deleted

## 2014-09-26 NOTE — Progress Notes (Unsigned)
Faxed copy of signed orders for PleurX supplies and insurance cards to [facility] [phone].  Faxed copy of PleurX supply orders with notation that he will need his home care nurse to follow up with him on 09/28/14 for qod drainage/teaching. IR notified that supplies have been ordered and sent.

## 2014-09-26 NOTE — Telephone Encounter (Signed)
Call from home health nurse, Nira Conn to confirm she received the notification for need for teaching/draining of his PleurX drain being placed tomorrow by IR. She will check on him Wednesday.

## 2014-09-27 ENCOUNTER — Encounter (HOSPITAL_COMMUNITY): Payer: Self-pay

## 2014-09-27 ENCOUNTER — Ambulatory Visit (HOSPITAL_COMMUNITY)
Admission: RE | Admit: 2014-09-27 | Discharge: 2014-09-27 | Disposition: A | Discharge status: Home / Self Care | Payer: Medicare Other | Source: Ambulatory Visit | Attending: Oncology | Admitting: Oncology

## 2014-09-27 ENCOUNTER — Ambulatory Visit (HOSPITAL_COMMUNITY)
Admission: RE | Admit: 2014-09-27 | Discharge: 2014-09-27 | Disposition: A | Discharge status: Home / Self Care | Payer: Medicare Other | Source: Ambulatory Visit | Attending: Interventional Radiology | Admitting: Interventional Radiology

## 2014-09-27 ENCOUNTER — Ambulatory Visit (HOSPITAL_COMMUNITY)
Admission: RE | Admit: 2014-09-27 | Discharge: 2014-09-27 | Disposition: A | Discharge status: Home / Self Care | Payer: Medicare Other | Source: Ambulatory Visit | Attending: Radiology | Admitting: Radiology

## 2014-09-27 ENCOUNTER — Other Ambulatory Visit: Payer: Self-pay | Admitting: Nurse Practitioner

## 2014-09-27 ENCOUNTER — Observation Stay (HOSPITAL_COMMUNITY)
Admission: RE | Admit: 2014-09-27 | Discharge: 2014-09-28 | Disposition: A | Discharge status: Home / Self Care | Payer: Medicare Other | Source: Ambulatory Visit | Attending: Interventional Radiology | Admitting: Interventional Radiology

## 2014-09-27 ENCOUNTER — Other Ambulatory Visit: Payer: Self-pay

## 2014-09-27 VITALS — BP 95/52 | HR 56 | Temp 97.7°F | Resp 24 | Ht 71.0 in | Wt 183.2 lb

## 2014-09-27 DIAGNOSIS — Z8249 Family history of ischemic heart disease and other diseases of the circulatory system: Secondary | ICD-10-CM | POA: Insufficient documentation

## 2014-09-27 DIAGNOSIS — C3492 Malignant neoplasm of unspecified part of left bronchus or lung: Secondary | ICD-10-CM | POA: Diagnosis not present

## 2014-09-27 DIAGNOSIS — J449 Chronic obstructive pulmonary disease, unspecified: Secondary | ICD-10-CM | POA: Diagnosis not present

## 2014-09-27 DIAGNOSIS — Z87891 Personal history of nicotine dependence: Secondary | ICD-10-CM | POA: Insufficient documentation

## 2014-09-27 DIAGNOSIS — J90 Pleural effusion, not elsewhere classified: Secondary | ICD-10-CM

## 2014-09-27 DIAGNOSIS — J910 Malignant pleural effusion: Principal | ICD-10-CM | POA: Insufficient documentation

## 2014-09-27 DIAGNOSIS — E785 Hyperlipidemia, unspecified: Secondary | ICD-10-CM | POA: Insufficient documentation

## 2014-09-27 DIAGNOSIS — Z7982 Long term (current) use of aspirin: Secondary | ICD-10-CM | POA: Insufficient documentation

## 2014-09-27 DIAGNOSIS — E119 Type 2 diabetes mellitus without complications: Secondary | ICD-10-CM | POA: Insufficient documentation

## 2014-09-27 DIAGNOSIS — I2510 Atherosclerotic heart disease of native coronary artery without angina pectoris: Secondary | ICD-10-CM | POA: Diagnosis not present

## 2014-09-27 DIAGNOSIS — I10 Essential (primary) hypertension: Secondary | ICD-10-CM | POA: Diagnosis not present

## 2014-09-27 DIAGNOSIS — G629 Polyneuropathy, unspecified: Secondary | ICD-10-CM | POA: Diagnosis not present

## 2014-09-27 DIAGNOSIS — I252 Old myocardial infarction: Secondary | ICD-10-CM | POA: Insufficient documentation

## 2014-09-27 DIAGNOSIS — J939 Pneumothorax, unspecified: Secondary | ICD-10-CM | POA: Diagnosis not present

## 2014-09-27 DIAGNOSIS — Z794 Long term (current) use of insulin: Secondary | ICD-10-CM | POA: Diagnosis not present

## 2014-09-27 LAB — BASIC METABOLIC PANEL
ANION GAP: 14 (ref 5–15)
BUN: 20 mg/dL (ref 6–23)
CALCIUM: 9.6 mg/dL (ref 8.4–10.5)
CO2: 25 mEq/L (ref 19–32)
CREATININE: 0.88 mg/dL (ref 0.50–1.35)
Chloride: 99 mEq/L (ref 96–112)
GFR calc Af Amer: >90 mL/min (ref >90)
GFR calc non Af Amer: 80 mL/min — ABNORMAL LOW (ref >90)
Glucose, Bld: 250 mg/dL — ABNORMAL HIGH (ref 70–99)
Potassium: 3.7 mEq/L (ref 3.7–5.3)
SODIUM: 138 meq/L (ref 137–147)

## 2014-09-27 LAB — CBC
HCT: 33.5 % — ABNORMAL LOW (ref 39.0–52.0)
HEMOGLOBIN: 10.7 g/dL — AB (ref 13.0–17.0)
MCH: 28.8 pg (ref 26.0–34.0)
MCHC: 31.9 g/dL (ref 30.0–36.0)
MCV: 90.3 fL (ref 78.0–100.0)
Platelets: 157 10*3/uL (ref 150–400)
RBC: 3.71 MIL/uL — ABNORMAL LOW (ref 4.22–5.81)
RDW: 16.4 % — AB (ref 11.5–15.5)
WBC: 5.5 10*3/uL (ref 4.0–10.5)

## 2014-09-27 LAB — PROTIME-INR
INR: 1.08 (ref 0.00–1.49)
Prothrombin Time: 14.1 seconds (ref 11.6–15.2)

## 2014-09-27 LAB — APTT: APTT: 33 s (ref 24–37)

## 2014-09-27 LAB — GLUCOSE, CAPILLARY
Glucose-Capillary: 182 mg/dL — ABNORMAL HIGH (ref 70–99)
Glucose-Capillary: 234 mg/dL — ABNORMAL HIGH (ref 70–99)

## 2014-09-27 MED ORDER — FUROSEMIDE 40 MG PO TABS
40.0000 mg | ORAL_TABLET | Freq: Every day | ORAL | Status: DC
  Administered 2014-09-27 – 2014-09-28 (×2): 40 mg via ORAL
  Filled 2014-09-27 (×2): qty 1

## 2014-09-27 MED ORDER — FENTANYL CITRATE 0.05 MG/ML IJ SOLN
INTRAMUSCULAR | Status: AC
  Filled 2014-09-27: qty 4

## 2014-09-27 MED ORDER — CEFAZOLIN SODIUM-DEXTROSE 2-3 GM-% IV SOLR
2.0000 g | Freq: Once | INTRAVENOUS | Status: AC
  Administered 2014-09-27: 2 g via INTRAVENOUS

## 2014-09-27 MED ORDER — GLUCERNA SHAKE PO LIQD
237.0000 mL | ORAL | Status: DC
  Administered 2014-09-27: 237 mL via ORAL
  Filled 2014-09-27 (×2): qty 237

## 2014-09-27 MED ORDER — MIDAZOLAM HCL 2 MG/2ML IJ SOLN
INTRAMUSCULAR | Status: AC | PRN
  Administered 2014-09-27: 1 mg via INTRAVENOUS
  Administered 2014-09-27: 0.5 mg via INTRAVENOUS

## 2014-09-27 MED ORDER — GABAPENTIN 600 MG PO TABS
600.0000 mg | ORAL_TABLET | Freq: Two times a day (BID) | ORAL | Status: DC
  Filled 2014-09-27: qty 1

## 2014-09-27 MED ORDER — POTASSIUM CHLORIDE ER 10 MEQ PO TBCR
40.0000 meq | EXTENDED_RELEASE_TABLET | Freq: Every day | ORAL | Status: DC
  Administered 2014-09-28: 40 meq via ORAL
  Filled 2014-09-27 (×2): qty 4

## 2014-09-27 MED ORDER — TAMSULOSIN HCL 0.4 MG PO CAPS
0.4000 mg | ORAL_CAPSULE | Freq: Every day | ORAL | Status: DC
  Administered 2014-09-28: 0.4 mg via ORAL
  Filled 2014-09-27: qty 1

## 2014-09-27 MED ORDER — FENTANYL CITRATE 0.05 MG/ML IJ SOLN
INTRAMUSCULAR | Status: AC | PRN
  Administered 2014-09-27 (×2): 25 ug via INTRAVENOUS

## 2014-09-27 MED ORDER — GABAPENTIN 300 MG PO CAPS
600.0000 mg | ORAL_CAPSULE | Freq: Two times a day (BID) | ORAL | Status: DC
Start: 2014-09-27 — End: 2014-09-28
  Administered 2014-09-27 – 2014-09-28 (×2): 600 mg via ORAL
  Filled 2014-09-27 (×3): qty 2

## 2014-09-27 MED ORDER — MIDAZOLAM HCL 2 MG/2ML IJ SOLN
INTRAMUSCULAR | Status: AC
  Filled 2014-09-27: qty 6

## 2014-09-27 MED ORDER — LIDOCAINE HCL 1 % IJ SOLN
INTRAMUSCULAR | Status: AC
Start: 2014-09-27 — End: 2014-09-28
  Filled 2014-09-27: qty 20

## 2014-09-27 MED ORDER — SODIUM CHLORIDE 0.9 % IV SOLN
Freq: Once | INTRAVENOUS | Status: AC
  Administered 2014-09-27: 13:00:00 via INTRAVENOUS

## 2014-09-27 MED ORDER — LINAGLIPTIN 5 MG PO TABS
5.0000 mg | ORAL_TABLET | Freq: Every day | ORAL | Status: DC
  Administered 2014-09-27 – 2014-09-28 (×2): 5 mg via ORAL
  Filled 2014-09-27 (×2): qty 1

## 2014-09-27 MED ORDER — SUCRALFATE 1 G PO TABS
1.0000 g | ORAL_TABLET | Freq: Four times a day (QID) | ORAL | Status: DC
  Administered 2014-09-27 – 2014-09-28 (×4): 1 g via ORAL
  Filled 2014-09-27 (×6): qty 1

## 2014-09-27 MED ORDER — INSULIN GLARGINE 100 UNIT/ML ~~LOC~~ SOLN
10.0000 [IU] | Freq: Every day | SUBCUTANEOUS | Status: DC
Start: 2014-09-27 — End: 2014-09-28
  Administered 2014-09-27: 10 [IU] via SUBCUTANEOUS
  Filled 2014-09-27 (×2): qty 0.1

## 2014-09-27 MED ORDER — CEFAZOLIN SODIUM-DEXTROSE 2-3 GM-% IV SOLR
INTRAVENOUS | Status: AC
  Administered 2014-09-27: 2 g via INTRAVENOUS
  Filled 2014-09-27: qty 50

## 2014-09-27 MED ORDER — HYDROCODONE-ACETAMINOPHEN 5-325 MG PO TABS
1.0000 | ORAL_TABLET | ORAL | Status: DC | PRN
  Administered 2014-09-27 – 2014-09-28 (×2): 2 via ORAL
  Filled 2014-09-27 (×2): qty 2

## 2014-09-27 MED ORDER — CITALOPRAM HYDROBROMIDE 20 MG PO TABS
20.0000 mg | ORAL_TABLET | Freq: Every day | ORAL | Status: DC
  Administered 2014-09-28: 20 mg via ORAL
  Filled 2014-09-27: qty 1

## 2014-09-27 MED ORDER — FINASTERIDE 5 MG PO TABS
5.0000 mg | ORAL_TABLET | Freq: Every day | ORAL | Status: DC
  Administered 2014-09-28: 5 mg via ORAL
  Filled 2014-09-27: qty 1

## 2014-09-27 MED ORDER — SODIUM CHLORIDE 0.9 % IV SOLN
INTRAVENOUS | Status: DC
  Administered 2014-09-27: 50 mL/h via INTRAVENOUS

## 2014-09-27 MED ORDER — PANTOPRAZOLE SODIUM 40 MG PO TBEC
40.0000 mg | DELAYED_RELEASE_TABLET | Freq: Every evening | ORAL | Status: DC
  Administered 2014-09-27: 40 mg via ORAL
  Filled 2014-09-27 (×3): qty 1

## 2014-09-27 MED ORDER — METOPROLOL TARTRATE 12.5 MG HALF TABLET
12.5000 mg | ORAL_TABLET | Freq: Every day | ORAL | Status: DC
  Administered 2014-09-27 – 2014-09-28 (×2): 12.5 mg via ORAL
  Filled 2014-09-27 (×2): qty 1

## 2014-09-27 MED ORDER — ASPIRIN 81 MG PO CHEW
81.0000 mg | CHEWABLE_TABLET | Freq: Two times a day (BID) | ORAL | Status: DC
  Administered 2014-09-27 – 2014-09-28 (×2): 81 mg via ORAL
  Filled 2014-09-27 (×3): qty 1

## 2014-09-27 MED ORDER — INSULIN ASPART 100 UNIT/ML ~~LOC~~ SOLN
5.0000 [IU] | Freq: Two times a day (BID) | SUBCUTANEOUS | Status: DC
  Administered 2014-09-27 – 2014-09-28 (×2): 5 [IU] via SUBCUTANEOUS

## 2014-09-27 MED ORDER — AMIODARONE HCL 200 MG PO TABS
200.0000 mg | ORAL_TABLET | Freq: Two times a day (BID) | ORAL | Status: DC
  Administered 2014-09-27 – 2014-09-28 (×2): 200 mg via ORAL
  Filled 2014-09-27 (×3): qty 1

## 2014-09-27 NOTE — H&P (Signed)
Chief Complaint: Recurrent left pleural effusion  Referring Physician(s): Dr. Benay Spice  History of Present Illness: [name] [name] is a 80 y.o. male with history of NSC/squamous cell cancer of left upper lobe lung and recurrent symptomatic left pleural effusion. He presents today for left pleurx catheter placement.  Past Medical History  Diagnosis Date  . Peripheral arterial disease     s/p Ao Bifem Bypass  . Diabetes mellitus without complication   . CAD (coronary artery disease)     By  CT Scan  . Hyperlipidemia   . Hypertension   . Non Hodgkin's lymphoma 2000, 2010  . Peripheral neuropathy   . COPD (chronic obstructive pulmonary disease)   . History of lower GI bleeding   . Substance abuse   . Lung cancer, upper lobe     left lung  . AAA (abdominal aortic aneurysm)   . MI (myocardial infarction) 04/10/14    Past Surgical History  Procedure Laterality Date  . Fracture surgery  2009    Left ankle  . Pr vein bypass graft,aorto-fem-pop  2005    Right common femoral to BK popliteal BPG  . Pr vein bypass graft,aorto-fem-pop  01-27-05    Revision of Right fem-pop BPG  . Cardiac catheterization  1990's  . Video bronchoscopy Bilateral 03/14/2014    Procedure: VIDEO BRONCHOSCOPY WITH FLUORO;  Surgeon: Rigoberto Noel, MD;  Location: [place];  Service: Cardiopulmonary;  Laterality: Bilateral;  . Nm myoview ltd  11/2013    Lexiscan.  EF 60%, small fixed inferoapical defect - ? artifact  . Transthoracic echocardiogram  11/2013    EF 60-65%, Gr 1 DD, Mod AS, Sever MAC - no MS.       Allergies: Review of patient's allergies indicates no known allergies.  Medications: Prior to Admission medications   Medication Sig Start Date End Date Taking? Authorizing Provider  amiodarone (PACERONE) 200 MG tablet Take 200 mg by mouth 2 (two) times daily.    Historical Provider, MD  aspirin 81 MG tablet Take 81 mg by mouth 2 (two) times daily.     Historical Provider, MD  citalopram  (CELEXA) 20 MG tablet Take 20 mg by mouth daily.    Historical Provider, MD  docusate sodium 100 MG CAPS Take 100 mg by mouth 2 (two) times daily. Patient not taking: Reported on 09/23/2014 09/12/14   Erline Hau, MD  feeding supplement, GLUCERNA SHAKE, (GLUCERNA SHAKE) LIQD Take 237 mLs by mouth daily. 08/23/14   Barton Dubois, MD  finasteride (PROSCAR) 5 MG tablet Take 5 mg by mouth daily.    Historical Provider, MD  furosemide (LASIX) 40 MG tablet Take 40 mg by mouth daily.    Historical Provider, MD  gabapentin (NEURONTIN) 600 MG tablet Take 600 mg by mouth 2 (two) times daily.     Historical Provider, MD  Glucosamine-Chondroit-Vit C-Mn (GLUCOSAMINE CHONDR 1500 COMPLX) CAPS Take 1 capsule by mouth daily.    Historical Provider, MD  insulin aspart (NOVOLOG) 100 UNIT/ML injection Inject 5 Units into the skin 2 (two) times daily.    Historical Provider, MD  insulin glargine (LANTUS) 100 UNIT/ML injection Inject 0.18 mLs (18 Units total) into the skin at bedtime. Patient taking differently: Inject 10 Units into the skin at bedtime.  08/23/14   Barton Dubois, MD  linagliptin (TRADJENTA) 5 MG TABS tablet Take 1 tablet (5 mg total) by mouth daily. With largest meal 07/12/14   Tiffany L Reed, DO  metoprolol tartrate (LOPRESSOR)  25 MG tablet Take 12.5 mg by mouth daily. 06/02/14   Mihai Croitoru, MD  oxyCODONE-acetaminophen (PERCOCET/ROXICET) 5-325 MG per tablet Take one tablet by mouth every 4 hours as needed for pain Patient not taking: Reported on 09/23/2014 06/30/14   Tiffany L Reed, DO  pantoprazole (PROTONIX) 40 MG tablet Take 40 mg by mouth every evening.    Historical Provider, MD  potassium chloride 20 MEQ TBCR Take 40 mEq by mouth daily. 07/12/14   Tiffany L Reed, DO  sucralfate (CARAFATE) 1 G tablet Take 1 tablet (1 g total) by mouth 4 (four) times daily. 05/14/14   Kalman Drape, MD  Tamsulosin HCl (FLOMAX) 0.4 MG CAPS Take 0.4 mg by mouth daily.    Historical Provider, MD    Family  History  Problem Relation Age of Onset  . Cancer Mother     Colon  . Heart attack Mother   . Stroke Father   . Hyperlipidemia Father   . Hyperlipidemia Sister   . Hyperlipidemia Brother   . Hyperlipidemia Daughter   . Hypertension Son     History   Social History  . Marital Status: Divorced    Spouse Name: N/A    Number of Children: 2  . Years of Education: N/A   Occupational History  . retired Administrator    Social History Main Topics  . Smoking status: Former Smoker -- 1.00 packs/day for 50 years    Types: Cigarettes    Quit date: 10/29/2003  . Smokeless tobacco: Never Used  . Alcohol Use: No  . Drug Use: No  . Sexual Activity: No   Other Topics Concern  . None   Social History Narrative         Review of Systems  Constitutional: Negative for fever and chills.  Respiratory: Positive for cough and shortness of breath.   Cardiovascular:       Occ  Left sided chest discomfort  Gastrointestinal: Negative for nausea, vomiting, abdominal pain and blood in stool.  Genitourinary: Negative for dysuria and hematuria.  Musculoskeletal: Negative for back pain.  Neurological: Negative for headaches.    Vital Signs: BP 95/67 mmHg  Pulse 79  Temp(Src) 97.4 F (36.3 C) (Oral)  Resp 22  SpO2 99%  Physical Exam  Constitutional: He is oriented to person, place, and time. He appears well-developed and well-nourished.  Cardiovascular: Normal rate.   Murmur heard. irregular  Pulmonary/Chest: Effort normal.  Sl dim BS left base, right clear; clean intact rt chest wall PAC  Abdominal: Soft. Bowel sounds are normal. There is no tenderness.  Musculoskeletal: Normal range of motion. He exhibits no edema.  Neurological: He is alert and oriented to person, place, and time.    Imaging: Dg Chest 1 View  09/11/2014   CLINICAL DATA:  80 year old male status post left thoracentesis  EXAM: CHEST - 1 VIEW  COMPARISON:  Prior chest radiograph 09/10/2014  FINDINGS: No  evidence of pneumothorax status post left thoracentesis. Overall, the volume of the left pleural effusion has decreased. There is incomplete expansion of the left lung with resultant right-to-left shift of the cardiac and mediastinal structures. Right IJ approach single-lumen power injectable port catheter. Catheter tip overlies the superior cavoatrial junction. Chronic bronchitic change and interstitial prominence similar compared to prior. Atherosclerotic calcification is present in the transverse aorta. No acute osseous abnormality.  IMPRESSION: 1. No evidence of pneumothorax status post left thoracentesis. 2. Decreased left pleural effusion with incomplete re-expansion of the left lung resulting in  persistent right-to-left shift of the cardiac and mediastinal structures.   Electronically Signed   By: Jacqulynn Cadet M.D.   On: 09/11/2014 12:23   Dg Chest 2 View  09/23/2014   CLINICAL DATA:  Lung cancer.  Follow-up pleural effusions.  EXAM: CHEST  2 VIEW  COMPARISON:  09/11/2014 and 09/10/2014  FINDINGS: Right IJ Port-A-Cath unchanged. Lungs are adequately inflated with continued evidence of a moderate size left pleural effusion with less fluid over the apex. Persistent right hilar/suprahilar opacification without significant change. Remainder of the exam is unchanged.  IMPRESSION: Moderate size left pleural effusion with less fluid tracking to the apex. Persistent moderate opacification of the left hilum/suprahilar region.  Right IJ Port-A-Cath unchanged.   Electronically Signed   By: Marin Olp M.D.   On: 09/23/2014 12:25   Dg Chest 2 View  09/10/2014   CLINICAL DATA:  Short of breath. History of left-sided lung cancer. Hypertension. Ex-smoker. ICD10: R 6.02.  EXAM: CHEST  2 VIEW  COMPARISON:  09/06/2014  FINDINGS: A right-sided Port-A-Cath which terminates at the mid to low SVC.  Moderate cardiomegaly. Left-sided pleural fluid is similar with loculation superiorly. Diffuse interstitial thickening.  Mild volume loss at the right lung base. Patchy left-sided airspace disease with similar relative left mid lung aeration.  IMPRESSION: No significant change since the prior exam.  Partially loculated left-sided pleural effusion with pulmonary parenchymal opacification throughout the left lung base and left apex. Likely a combination of tumor and treatment effects. A component of interstitial edema cannot be excluded.   Electronically Signed   By: Abigail Miyamoto M.D.   On: 09/10/2014 18:18   Dg Chest 2 View  09/06/2014   CLINICAL DATA:  Left-sided chest discomfort with shortness of breath ; history of lung malignancy.  EXAM: CHEST  2 VIEW  COMPARISON:  Portable chest x-ray of August 22, 2014  FINDINGS: There is new volume loss on the left consistent with further accumulation pleural fluid at the lung base and in the apex. The pulmonary interstitial markings are increased in the left mid lung but are stable. On the right the interstitial markings are mildly increased though stable. The left heart border is obscured. The right heart border is unremarkable. There is dense calcification of the mitral valvular annulus. The pulmonary vascularity is not engorged. The power port appliance tip overlies the midportion of the SVC. The bony thorax is unremarkable.  IMPRESSION: There is new volume loss on the left most compatible with accumulation of pleural fluid. There is persistent left perihilar increased density consistent with central tumor.   Electronically Signed   By: David  Martinique   On: 09/06/2014 16:59   Ct Angio Chest Pe W/cm &/or Wo Cm  09/10/2014   CLINICAL DATA:  80 year old male with increasing shortness of breath. Patient with non-small cell left lung cancer.  EXAM: CT ANGIOGRAPHY CHEST WITH CONTRAST  TECHNIQUE: Multidetector CT imaging of the chest was performed using the standard protocol during bolus administration of intravenous contrast. Multiplanar CT image reconstructions and MIPs were obtained to  evaluate the vascular anatomy.  CONTRAST:  129mL OMNIPAQUE IOHEXOL 350 MG/ML SOLN  COMPARISON:  09/10/2014 and prior chest radiographs. 07/11/2014 and prior chest CTs.  FINDINGS: This is a technically satisfactory study.  There is no evidence of acute pulmonary emboli.  Cardiomegaly and heavy coronary artery calcifications are again noted.  There is no evidence of thoracic aortic aneurysm.  A large left pleural effusion appear slightly increased from 09/06/2014 chest radiograph.  There is no evidence of right pleural effusion or pericardial effusion.  A 4 x 10 cm left upper lobe mass/paramediastinal mass has slightly increased in size now measuring 4 x 10 cm (image 37), previously 3.6 x 8.7 cm this same level.  Increasing left mid and upper lung consolidation/mass since the prior CT is noted and may represent neoplasm, post treatment change versus infection.  Slightly increased interstitial opacities within the right lung may represent interstitial edema with lymphangitic tumor spread less likely.  There is no evidence of pneumothorax.  A right Port-A-Cath is again identified with tip at the superior cavoatrial junction.  No acute bony abnormalities are noted.  Review of the MIP images confirms the above findings.  IMPRESSION: No evidence of pulmonary emboli.  Large left pleural effusion, appears slightly increased from 09/06/2014 chest radiograph.  Slightly increasing size of known left upper lobe/ paramediastinal neoplasm. Increasing left mid and upper lung consolidations/mass may represent neoplasm, posttreatment change versus infection.  Increasing right lung interstitial opacities -question interstitial edema with lymphangitic tumor spread less likely.  Cardiomegaly and coronary artery disease.   Electronically Signed   By: Hassan Rowan M.D.   On: 09/10/2014 20:54   US Thoracentesis Asp Pleural Space W/img Guide  09/11/2014   CLINICAL DATA:  History of lung cancer. Recurrent left pleural effusion, shortness of  breath. Request diagnostic and therapeutic thoracentesis.  EXAM: ULTRASOUND GUIDED LEFT THORACENTESIS  COMPARISON:  Previous left thoracentesis  PROCEDURE: An ultrasound guided thoracentesis was thoroughly discussed with the patient and questions answered. The benefits, risks, alternatives and complications were also discussed. The patient understands and wishes to proceed with the procedure. Written consent was obtained.  Ultrasound was performed to localize and mark an adequate pocket of fluid in the left chest. The area was then prepped and draped in the normal sterile fashion. 1% Lidocaine was used for local anesthesia. Under ultrasound guidance a 6 French Safe-T-Centesis catheter was introduced. Thoracentesis was performed. The catheter was removed and a dressing applied.  COMPLICATIONS: None.  FINDINGS: A total of approximately 1.6 L of clear yellow fluid was removed. A fluid sample well assent for laboratory analysis.  IMPRESSION: Successful ultrasound guided left thoracentesis yielding 1.6 L of pleural fluid.  Read by: Ascencion Dike PA-C   Electronically Signed   By: Daryll Brod M.D.   On: 09/11/2014 11:27    Labs:  CBC:  Recent Labs  08/18/14 1419 08/19/14 0415 09/10/14 1744 09/11/14 0617  WBC 5.4 5.6 4.6 4.1  HGB 9.3* 9.1* 10.2* 10.2*  HCT 29.2* 28.6* 32.0* 31.8*  PLT 154 149* 139* 126*    COAGS:  Recent Labs  04/13/14 0315 05/13/14 2240  INR 0.95 1.06    BMP:  Recent Labs  08/22/14 0535 08/23/14 0430 09/10/14 1744 09/11/14 0617  NA 134* 136* 144 140  K 4.1 4.1 3.7 3.7  CL 92* 93* 104 100  CO2 33* 32 27 29  GLUCOSE 107* 107* 174* 116*  BUN 16 17 16 16   CALCIUM 8.0* 8.1* 8.9 9.1  CREATININE 0.90 0.87 0.81 0.88  GFRNONAA 79* 80* 82* 80*  GFRAA >90 >90 >90 >90    LIVER FUNCTION TESTS:  Recent Labs  07/23/14 0955 08/01/14 0500 09/10/14 1744 09/11/14 0617  BILITOT 0.7 0.6 1.0 1.1  AST 23 34 17 17  ALT 19 16 10 9   ALKPHOS 39 46 68 67  PROT 5.8* 6.2  6.7 6.6  ALBUMIN 2.6* 2.6* 3.0* 2.9*    TUMOR MARKERS: No results  for input(s): AFPTM, CEA, CA199, CHROMGRNA in the last 8760 hours.  Assessment and Plan: [name] [name] is a 80 y.o. male with history of NSC/squamous cell cancer of left upper lobe lung and recurrent symptomatic left pleural effusion (last thoracentesis 09/11/14 with 1.6 liters removed). He presents today for left pleurx catheter placement. Details/risks of procedure d/w pt/son with their understanding and consent.          Signed: Autumn Messing 09/27/2014, 12:14 PM

## 2014-09-27 NOTE — Procedures (Signed)
Procedure:  Left PleurX catheter placement Findings:  PleurX catheter placed in left pleural space from lateral approach. Actively removing fluid right now.

## 2014-09-27 NOTE — Progress Notes (Signed)
Report called to [place], pt going to room 1408.  Pt informed of plan and okay with this. Pt's son is at this bedside.  Pleurex bottles box given to pt's son for him to take home at d/c.

## 2014-09-27 NOTE — Progress Notes (Addendum)
Patient ID: [name] [name], male   DOB: [birth year], 80 y.o.   MRN: [id number] Pt s/p left pleurx cath today with subsequent collapse of left lung. Approx 1.5 liters fluid removed. Currently stable with pleurx attached to pleuravac and wall suction. Denies increasing CP; baseline dyspnea. O2 sats 100 %  3 liters N/C. F/u CXR pending. Will admit for overnight obs. Marlynn Perking made aware.

## 2014-09-28 ENCOUNTER — Observation Stay (HOSPITAL_COMMUNITY): Payer: Medicare Other

## 2014-09-28 DIAGNOSIS — J910 Malignant pleural effusion: Secondary | ICD-10-CM | POA: Diagnosis not present

## 2014-09-28 LAB — CBC
HCT: 33 % — ABNORMAL LOW (ref 39.0–52.0)
Hemoglobin: 10.8 g/dL — ABNORMAL LOW (ref 13.0–17.0)
MCH: 29.8 pg (ref 26.0–34.0)
MCHC: 32.7 g/dL (ref 30.0–36.0)
MCV: 90.9 fL (ref 78.0–100.0)
PLATELETS: 168 10*3/uL (ref 150–400)
RBC: 3.63 MIL/uL — AB (ref 4.22–5.81)
RDW: 16.5 % — AB (ref 11.5–15.5)
WBC: 5.1 10*3/uL (ref 4.0–10.5)

## 2014-09-28 LAB — GLUCOSE, CAPILLARY: Glucose-Capillary: 168 mg/dL — ABNORMAL HIGH (ref 70–99)

## 2014-09-28 MED ORDER — HEPARIN SOD (PORK) LOCK FLUSH 100 UNIT/ML IV SOLN
500.0000 [IU] | INTRAVENOUS | Status: AC | PRN
  Administered 2014-09-28: 500 [IU]

## 2014-09-28 NOTE — Care Management Note (Addendum)
    Page 1 of 2   09/28/2014     3:58:49 PM CARE MANAGEMENT NOTE 09/28/2014  Patient:  [name] [name], [name] [name]   Account Number:  [id number]  Date Initiated:  09/28/2014  Documentation initiated by:  Dessa Phi  Subjective/Objective Assessment:   [address] [place].     Action/Plan:   FROM HOME.   Anticipated DC Date:  09/28/2014   Anticipated DC Plan:  [place]  CM consult      [facility] Choice  Resumption Of Svcs/PTA Provider   Choice offered to / List presented to:  C-1 Patient        [place] arranged  HH-1 RN  [place] agency  [place]   Status of service:  Completed, signed off Medicare Important Message given?   (If response is "NO", the following Medicare IM given date fields will be blank) Date Medicare IM given:   Medicare IM given by:   Date Additional Medicare IM given:   Additional Medicare IM given by:    Discharge Disposition:  [place]  Per UR Regulation:  Reviewed for med. necessity/level of care/duration of stay  If discussed at Long Length of Stay Meetings, dates discussed:    Comments:  09/28/14 Assia Meanor RN,BSN NCM [place] [phone] [place], [place].PATIENT HAS 1 BOX PLEURX CATH CANNISTERS @ HOME FROM YESTERDAY PER [facility] CCONFIRMED HE GAVE FAMILY BOX OF CANNISTERS YESTERDAY TO TAKE HOME.NO FURTHER D/C NEEDS.  MONITOR PROGRESS & D/C NEEDS.

## 2014-09-28 NOTE — Progress Notes (Signed)
UR completed 

## 2014-09-28 NOTE — Progress Notes (Signed)
Subjective: Pt feels ok. No SOB. Some mild pain at pleurX catheter site   Objective: Physical Exam: BP 103/62 mmHg  Pulse 60  Temp(Src) 97.6 F (36.4 C) (Oral)  Resp 18  Ht 5\' 11"  (1.803 m)  Wt 183 lb 3.2 oz (83.1 kg)  BMI 25.56 kg/m2  SpO2 100% Chest: lungs CTA, (L)PleurX catheter intact, site clean, about 368mL of serous output. No air leak CXR unchanged 25% PTX    Labs: CBC  Recent Labs  09/27/14 1142 09/28/14 0530  WBC 5.5 5.1  HGB 10.7* 10.8*  HCT 33.5* 33.0*  PLT 157 168   BMET  Recent Labs  09/27/14 1220  NA 138  K 3.7  CL 99  CO2 25  GLUCOSE 250*  BUN 20  CREATININE 0.88  CALCIUM 9.6   LFT No results for input(s): PROT, ALBUMIN, AST, ALT, ALKPHOS, BILITOT, BILIDIR, IBILI, LIPASE in the last 72 hours. PT/INR  Recent Labs  09/27/14 1142  LABPROT 14.1  INR 1.08     Studies/Results: Dg Chest 1 View  09/28/2014   CLINICAL DATA:  Left pneumothorax, treated with chest tube.  EXAM: CHEST - 1 VIEW  COMPARISON:  Portable chest x-ray of September 27, 2014.  FINDINGS: The left-sided pneumothorax is stable. The PleurX catheter tip overlies the posterior aspect of the left fifth rib. There remains increased density throughout much of the left lung. The right lung demonstrates diffusely increased interstitial markings which are stable. The cardiopericardial silhouette remains enlarged and the central pulmonary vascularity remains prominent. The mediastinum is not shifted. The Port-A-Cath appliance tip projects over the midportion of the SVC. There are degenerative changes of the right shoulder.  IMPRESSION: Stable appearance of the approximately 25-30% left apical pneumothorax. The left-sided PleurX catheter is unchanged in position. There is no significant change elsewhere in the thorax.   Electronically Signed   By: David  Martinique   On: 09/28/2014 07:26   Ir Lenise Arena W Catheter Placement  09/27/2014   CLINICAL DATA:  Left lung carcinoma with recurrent  pleural effusion requiring tunneled pleural drainage catheter placement.  EXAM: INSERTION OF TUNNELED PLEURAL DRAINAGE CATHETER  ANESTHESIA/SEDATION: 1.5 mg IV Versed; 50 mcg IV Fentanyl.  Total Moderate Sedation Time  38 minutes.  MEDICATIONS: 2 g IV Ancef. As antibiotic prophylaxis, Ancef was ordered pre-procedure and administered intravenously within one hour of incision.  FLUOROSCOPY TIME:  18 seconds.  PROCEDURE: The procedure, risks, benefits, and alternatives were explained to the patient. Questions regarding the procedure were encouraged and answered. The patient understands and consents to the procedure.  The left chest wall was prepped with Betadine in a sterile fashion, and a sterile drape was applied covering the operative field. A sterile gown and sterile gloves were used for the procedure. A time-out procedure was performed. Local anesthesia was provided with 1% Lidocaine. Ultrasound image documentation was performed.  After creating a small skin incision, a 19 gauge sheath needle was advanced into the pleural cavity under ultrasound guidance. A guide wire was then advanced under fluoroscopy into the pleural space. Pleural access was dilated serially and a 16-French peel-away sheath placed.  A 16 French tunneled Bard Aspira catheter was placed. This was tunneled from an incision 5 cm below the pleural access to the access site. The catheter was advanced through the peel-away sheath. The sheath was then removed. Final catheter positioning was confirmed with a fluoroscopic spot image.  The access incision was closed with subcutaneous 3-0 Monocryl and subcuticular 4-0 Vicryl. Dermabond was  applied to the incision. A Prolene retention suture was applied at the catheter exit site. Large volume thoracentesis was performed through the new catheter utilizing wall suction.  COMPLICATIONS: Ex vacuo left pneumothorax. SIR level B: Nominal therapy (including overnight admission for observation), no consequence.   FINDINGS: The catheter was placed via the left lateral chest wall. Catheter course is towards the apex. Approximately 1.5 liters of pleural fluid was able to be removed after catheter placement.  During fluid removal, it was noted that there was return of air from the catheter. A chest x-ray performed immediately after fluid evacuation demonstrates a large ex vacuo pneumothorax on the left with a collapsed central left lung. Some of the left lower lung remains aerated. The PleurX catheter appears in appropriate position and was connected to a Sahara Pleur-Evac device and wall suction at -20 cm of water. A repeat chest x-ray will be performed after a sustained. Of wall suction.  IMPRESSION: Placement of permanent, tunneled left pleural drainage catheter via lateral approach. 1.5 liters of pleural fluid was removed today after catheter placement. There was return of air during fluid removal with chest x-ray showing a large ex vacuo left pneumothorax and collapse of the central lung around a central mass. The patient was asymptomatic with no change in oxygen saturation. The PleurX catheter was connected to wall suction and a repeat chest x-ray will be performed. If this shows continued significant ex vacuo pneumothorax, the patient will be admitted for overnight observation.   Electronically Signed   By: Aletta Edouard M.D.   On: 09/27/2014 17:20   Dg Chest Port 1 View  09/27/2014   CLINICAL DATA:  Large left ex vacuo pneumothorax after placement of PleurX tunneled drain and removal of 1.5 L of fluid. The catheter has been connected to wall suction over the last 45 min.  EXAM: PORTABLE CHEST - 1 VIEW  COMPARISON:  Film at 1603 hr  FINDINGS: There is reduction in left pneumothorax with approximately a 25-30% pneumothorax remaining. The right lung shows stable aeration. No significant pleural fluid identified. No evidence of tension pneumothorax.  IMPRESSION: Improved re-expansion and aeration of the left lung after  placing the PleurX drainage catheter to wall suction. A 25-30% pneumothorax remains. The drain will be left to wall suction overnight.   Electronically Signed   By: Aletta Edouard M.D.   On: 09/27/2014 17:34   Dg Chest Port 1 View  09/27/2014   CLINICAL DATA:  PleurX catheter placement  EXAM: PORTABLE CHEST - 1 VIEW  COMPARISON:  Chest x-ray of 09/24/2011  FINDINGS: A PleurX catheter lies at the left lung base with the tip extending toward the left apex. However the left lung is completely collapsed. The left pleural effusion noted previously is not currently seen. Prominent interstitial markings are noted throughout the right lung. A right-sided Port-A-Cath is present.  IMPRESSION: Complete collapse of the left lung. Left PleurX catheter is present.  I called this report to the special procedure technologist at [facility] at the time of interpretation who informed me that Dr. Kathlene Cote was aware of this finding.   Electronically Signed   By: Ivar Drape M.D.   On: 09/27/2014 16:39    Assessment/Plan: Chronic left pleural effusion s/p PleurX catheter placement 12/1 Post procedure PTX, this is exvacuo secondary to trapped lung. Review of CT, this is not likely to resolve. PleurX is beneficial to keep effusion drained. WIll cap drain this am, if tolerates for a few hours without issue,  can DC home today.    LOS: 1 day    Ascencion Dike PA-C 09/28/2014 9:42 AM

## 2014-09-28 NOTE — Discharge Summary (Signed)
Physician Discharge Summary  Patient ID: [name] [name] MRN: [id number] DOB/AGE: 06-02-1933 80 y.o.  Admit date: 09/27/2014 Discharge date: 09/28/2014  Admission Diagnoses: Active Problems:   Left Pneumothorax following placement of (L) PleurX drainage catheter   Recurrent malignant left pleural effusion  Discharge Diagnoses:  Active Problems:    Left Pneumothorax following placement of (L) PleurX drainage catheter      Recurrent malignant left pleural effusion  Procedures: (L)PleurX drainage catheter placement 09/27/2014  Discharged Condition: good  Hospital Course: Pt had (L)PleurX catheter placed and developed significant left pneumothorax.  Felt to be Ex Vacuo in nature given trapped lung from malignant process, decision was made to admit for observation The pt was admitted and his pleural catheter hooked to a Pleurevac device for suction. Pt felt fine with the exception of some mild pain at the drain site. No overnight SOB. He drained an additional 337mL from the catheter overnight. Follow up CXR showed stability of the PTX. His drain was then removed from suction and capped. Four hours later, another CXR again showed stability of the PTX. It is felt that this represent chronic (L) ex vacuo PTX due to trapped lung. This is not causing the pt any respiratory distress, nor will it improve/resolve with chest tube placement. The PleurX will remain functional to keep his effusion well drained. Home Helath is arranged to manage this. All instructions, follow up plans, and medications were reviewed prior to discharge.  Consults: None   Discharge Exam: Blood pressure 95/52, pulse 56, temperature 97.7 F (36.5 C), temperature source Oral, resp. rate 24, height 5\' 11"  (1.803 m), weight 183 lb 3.2 oz (83.1 kg), SpO2 100 %.    Disposition: 06-Home-Health Care Svc  Discharge Instructions    Call MD for:  difficulty breathing, headache or visual disturbances    Complete by:  As directed       Call MD for:  redness, tenderness, or signs of infection (pain, swelling, redness, odor or green/yellow discharge around incision site)    Complete by:  As directed      Call MD for:  severe uncontrolled pain    Complete by:  As directed      Call MD for:  temperature >100.4    Complete by:  As directed      Change dressing (specify)    Complete by:  As directed   Dressing change: HHRN is arranged to assess and manage (L)PleurX drain.     Diet - low sodium heart healthy    Complete by:  As directed      Increase activity slowly    Complete by:  As directed      May shower / Bathe    Complete by:  As directed      May walk up steps    Complete by:  As directed             Medication List    TAKE these medications        amiodarone 200 MG tablet  Commonly known as:  PACERONE  Take 200 mg by mouth 2 (two) times daily.     aspirin 81 MG tablet  Take 81 mg by mouth 2 (two) times daily.     citalopram 20 MG tablet  Commonly known as:  CELEXA  Take 20 mg by mouth daily.     DSS 100 MG Caps  Take 100 mg by mouth 2 (two) times daily.     feeding supplement (Novi)  Liqd  Take 237 mLs by mouth daily.     finasteride 5 MG tablet  Commonly known as:  PROSCAR  Take 5 mg by mouth daily.     furosemide 40 MG tablet  Commonly known as:  LASIX  Take 40 mg by mouth daily.     gabapentin 600 MG tablet  Commonly known as:  NEURONTIN  Take 600 mg by mouth 2 (two) times daily.     GLUCOSAMINE CHONDR 1500 COMPLX Caps  Take 1 capsule by mouth daily.     insulin aspart 100 UNIT/ML injection  Commonly known as:  novoLOG  Inject 5 Units into the skin 2 (two) times daily.     insulin glargine 100 UNIT/ML injection  Commonly known as:  LANTUS  Inject 0.18 mLs (18 Units total) into the skin at bedtime.     linagliptin 5 MG Tabs tablet  Commonly known as:  TRADJENTA  Take 1 tablet (5 mg total) by mouth daily. With largest meal     metoprolol tartrate 25 MG tablet   Commonly known as:  LOPRESSOR  Take 12.5 mg by mouth daily.     oxyCODONE-acetaminophen 5-325 MG per tablet  Commonly known as:  PERCOCET/ROXICET  Take one tablet by mouth every 4 hours as needed for pain     pantoprazole 40 MG tablet  Commonly known as:  PROTONIX  Take 40 mg by mouth every evening.     Potassium Chloride ER 20 MEQ Tbcr  Take 40 mEq by mouth daily.     sucralfate 1 G tablet  Commonly known as:  CARAFATE  Take 1 tablet (1 g total) by mouth 4 (four) times daily.     tamsulosin 0.4 MG Caps capsule  Commonly known as:  FLOMAX  Take 0.4 mg by mouth daily.     TYLENOL ARTHRITIS PAIN 650 MG CR tablet  Generic drug:  acetaminophen  Take 650 mg by mouth 2 (two) times daily as needed for pain.           Follow-up Information    Follow up with Betsy Coder, MD.   Specialty:  Oncology   Contact information:   [place] [address] [phone]       Follow up with [facility].   Why:  If concerns regarding drain, may call [phone] or [phone]      Signed: Ascencion Dike PA-C 09/28/2014, 3:25 PM

## 2014-09-28 NOTE — Progress Notes (Signed)
INITIAL NUTRITION ASSESSMENT  DOCUMENTATION CODES Per approved criteria  -Non-severe (moderate) malnutrition in the context of chronic illness  Pt meets criteria for moderate MALNUTRITION in the context of chronic illness as evidenced by moderate muscle wasting, PO intake < 75% for > one month, 5% body weight loss in one month.   INTERVENTION: -Recommend Glucerna shakes po BID, each supplement provides 220 kcal and 10 grams of protein -Encouraged intake of balanced meals and snacks for weight/muscle maintenance -RD to continue to monitor  NUTRITION DIAGNOSIS: Unintentional wt los related to chronic illness as evidenced by 10lb weight loss in one month  Goal: Pt to meet >/= 90% of their estimated nutrition needs    Monitor:  Total protein/energy intake, labs, weights, education needs  Reason for Assessment: MST  80 y.o. male  Admitting Dx: <principal problem not specified>  ASSESSMENT: Admission for Reccurent pleural effusion and hx of Lung mass causing dyspnea.   -Pt familiar to RD from previous admits -Has experienced ongoing weight loss. Has lost 10 lbs in past one month (5% body weight loss, significant for time frame) -Usual body weight 240 lb; has been losing weight for > 3 months -Appetite has been fair, pt usually consumes three small meals daily, and will snack on peanut butter and crackers -Drinks Ensure occasionally, but not on regular basis.  -Family in room in agreement to assist pt by providing supplements/purchasing supplements in bulk -Is currently eating well during admit, 100% of meals -Provided pt with nutrition supplement coupons  Height: Ht Readings from Last 1 Encounters:  09/27/14 5\' 11"  (1.803 m)    Weight: Wt Readings from Last 1 Encounters:  09/27/14 183 lb 3.2 oz (83.1 kg)    Ideal Body Weight: 172 lb  % Ideal Body Weight: 106%  Wt Readings from Last 10 Encounters:  09/27/14 183 lb 3.2 oz (83.1 kg)  09/23/14 191 lb (86.637 kg)   09/15/14 189 lb 14.4 oz (86.138 kg)  09/12/14 188 lb 4.8 oz (85.412 kg)  09/06/14 195 lb 1.6 oz (88.497 kg)  08/23/14 193 lb 9 oz (87.8 kg)  08/12/14 203 lb (92.08 kg)  08/05/14 202 lb (91.627 kg)  08/02/14 193 lb 12.6 oz (87.9 kg)  07/14/14 217 lb (98.431 kg)    Usual Body Weight: 240 lb  % Usual Body Weight: 72%  BMI:  Body mass index is 25.56 kg/(m^2).  Estimated Nutritional Needs: Kcal: 2000-2200 Protein: 95-105 gram Fluid: >/= 1900 ml daily  Skin: +1 RLE edema, +1 LLE edema  Diet Order: Diet Heart Diet - low sodium heart healthy  EDUCATION NEEDS: -No education needs identified at this time   Intake/Output Summary (Last 24 hours) at 09/28/14 1530 Last data filed at 09/28/14 1300  Gross per 24 hour  Intake 485.83 ml  Output    700 ml  Net -214.17 ml    Last BM: PTA   Labs:   Recent Labs Lab 09/27/14 1220  NA 138  K 3.7  CL 99  CO2 25  BUN 20  CREATININE 0.88  CALCIUM 9.6  GLUCOSE 250*    CBG (last 3)   Recent Labs  09/27/14 1720 09/27/14 2147 09/28/14 0701  GLUCAP 182* 234* 168*    Scheduled Meds: . amiodarone  200 mg Oral BID  . aspirin  81 mg Oral BID  . citalopram  20 mg Oral Daily  . feeding supplement (GLUCERNA SHAKE)  237 mL Oral Q24H  . finasteride  5 mg Oral Daily  . furosemide  40 mg Oral Daily  . gabapentin  600 mg Oral BID  . insulin aspart  5 Units Subcutaneous BID  . insulin glargine  10 Units Subcutaneous QHS  . linagliptin  5 mg Oral Daily  . metoprolol tartrate  12.5 mg Oral Daily  . pantoprazole  40 mg Oral QPM  . potassium chloride  40 mEq Oral Daily  . sucralfate  1 g Oral QID  . tamsulosin  0.4 mg Oral Daily    Continuous Infusions: . sodium chloride 50 mL/hr (09/27/14 1805)    Past Medical History  Diagnosis Date  . Peripheral arterial disease     s/p Ao Bifem Bypass  . Diabetes mellitus without complication   . CAD (coronary artery disease)     By  CT Scan  . Hyperlipidemia   . Hypertension   .  Peripheral neuropathy   . COPD (chronic obstructive pulmonary disease)   . History of lower GI bleeding   . Substance abuse   . AAA (abdominal aortic aneurysm)   . MI (myocardial infarction) 04/10/14  . Non Hodgkin's lymphoma 2000, 2010  . Lung cancer, upper lobe     left lung    Past Surgical History  Procedure Laterality Date  . Fracture surgery  2009    Left ankle  . Pr vein bypass graft,aorto-fem-pop  2005    Right common femoral to BK popliteal BPG  . Pr vein bypass graft,aorto-fem-pop  01-27-05    Revision of Right fem-pop BPG  . Cardiac catheterization  1990's  . Video bronchoscopy Bilateral 03/14/2014    Procedure: VIDEO BRONCHOSCOPY WITH FLUORO;  Surgeon: Rigoberto Noel, MD;  Location: [facility];  Service: Cardiopulmonary;  Laterality: Bilateral;  . Nm myoview ltd  11/2013    Lexiscan.  EF 60%, small fixed inferoapical defect - ? artifact  . Transthoracic echocardiogram  11/2013    EF 60-65%, Gr 1 DD, Mod AS, Sever MAC - no MS.       Atlee Abide MS RD LDN Clinical Dietitian [phone]

## 2014-09-29 ENCOUNTER — Telehealth: Payer: Self-pay | Admitting: *Deleted

## 2014-09-29 NOTE — Telephone Encounter (Signed)
VERBAL ORDER AND READ BACK TO DR.Whiteside CATHETER Monday, Wednesday, AND Friday. WHEN DRAINAGE IS LESS THAN 500CC CHANGE SCHEDULE TO Monday AND Thursday. NOTIFIED HEATHER. SHE VOICES UNDERSTANDING.

## 2014-10-06 ENCOUNTER — Encounter (HOSPITAL_COMMUNITY): Payer: Self-pay | Admitting: Cardiology

## 2014-10-07 ENCOUNTER — Ambulatory Visit (HOSPITAL_COMMUNITY)
Admission: RE | Admit: 2014-10-07 | Discharge: 2014-10-07 | Disposition: A | Discharge status: Home / Self Care | Payer: Medicare Other | Source: Ambulatory Visit | Attending: Nurse Practitioner | Admitting: Nurse Practitioner

## 2014-10-07 ENCOUNTER — Telehealth: Payer: Self-pay | Admitting: Oncology

## 2014-10-07 ENCOUNTER — Ambulatory Visit (HOSPITAL_BASED_OUTPATIENT_CLINIC_OR_DEPARTMENT_OTHER): Payer: Medicare Other | Admitting: Nurse Practitioner

## 2014-10-07 VITALS — BP 87/53 | HR 84 | Temp 97.9°F | Resp 18 | Ht 71.0 in | Wt 191.6 lb

## 2014-10-07 DIAGNOSIS — Z85118 Personal history of other malignant neoplasm of bronchus and lung: Secondary | ICD-10-CM | POA: Insufficient documentation

## 2014-10-07 DIAGNOSIS — C3412 Malignant neoplasm of upper lobe, left bronchus or lung: Secondary | ICD-10-CM

## 2014-10-07 DIAGNOSIS — N189 Chronic kidney disease, unspecified: Secondary | ICD-10-CM

## 2014-10-07 DIAGNOSIS — J939 Pneumothorax, unspecified: Secondary | ICD-10-CM | POA: Diagnosis not present

## 2014-10-07 DIAGNOSIS — J90 Pleural effusion, not elsewhere classified: Secondary | ICD-10-CM

## 2014-10-07 LAB — FUNGUS CULTURE W SMEAR: Fungal Smear: NONE SEEN

## 2014-10-07 MED ORDER — TRAMADOL HCL 50 MG PO TABS: 50.0000 mg | ORAL_TABLET | Freq: Three times a day (TID) | ORAL | Status: DC | PRN

## 2014-10-07 NOTE — Progress Notes (Addendum)
[facility] OFFICE PROGRESS NOTE   Diagnosis:  Lung cancer  INTERVAL HISTORY:   He returns as scheduled. He underwent placement of a Pleurx catheter on 09/27/2014. The catheter is being drained 3 times a week. He reports the drainage ranges from 600-900 mL. He notes improvement in dyspnea following each drainage procedure. He has left-sided chest pain when the catheter is drained. He reports a good appetite. Energy level is some better. He has persistent hoarseness.  Objective:  Vital signs in last 24 hours:  Blood pressure 87/53, pulse 84, temperature 97.9 F (36.6 C), temperature source Oral, resp. rate 18, height 5\' 11"  (1.803 m), weight 191 lb 9.6 oz (86.909 kg), SpO2 92 %.    HEENT: no thrush or ulcers. Resp: good air movement bilaterally. Lungs are clear. Left-sided Pleurx catheter. Cardio: regular rate and rhythm. GI: abdomen soft and nontender. Vascular: trace lower leg edema bilaterally. Neuro: alert and oriented.  Skin: no rash.  Port-A-Cath without erythema.  Lab Results:  Lab Results  Component Value Date   WBC 5.1 09/28/2014   HGB 10.8* 09/28/2014   HCT 33.0* 09/28/2014   MCV 90.9 09/28/2014   PLT 168 09/28/2014   NEUTROABS 2.9 09/10/2014    Imaging:  No results found.  Medications: I have reviewed the patient's current medications.  Assessment/Plan: 1. 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   Initiation of chest radiation 04/05/2014. Radiation completed 05/30/2014.   Initiation of weekly Taxol/carboplatin chemotherapy 04/18/2014. Final weekly Taxol/carboplatin given 05/30/2014.  Restaging CT 07/11/2014 with a decrease in the left upper lobe mass, new bilateral pleural effusions  Chest CT 09/10/2014 showed a large left pleural effusion and slight increase  in the size of the left upper lobe/paramediastinal mass. Cytology on the pleural fluid showed reactive mesothelial cells. 2. Admission 04/10/2014 with acute CHF and acute coronary syndrome, status post a cardiac catheterization 04/10/2014, clinical improvement with medical therapy.  3. COPD  4. Chronic renal failure  5. Diabetes  6. Neuropathy  7. History of non-Hodgkin's lymphoma  8. Atrial fibrillation.  9. Status post evaluation in the emergency department 05/13/2014 for chest pain. Question radiation esophagitis.  10. Oral candidiasis. He completed a 5 day course of Diflucan.  11. History of thrombocytopenia secondary to chemotherapy. The carboplatin was dose reduced on 05/30/2014.  12. 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 Persistent. 13. Hospitalization 07/23/2014 through 08/02/2014 with CHF. 14. Hospitalization 08/18/2014 through 08/23/2014 with CHF. 15. Bilateral pleural effusions on chest CT 07/11/2014. Status post thoracentesis 08/02/2014 and 08/22/2014. It does not appear fluid was sent for cytology. Status post thoracentesis 09/11/2014. Pathology showed reactive mesothelial cells. Status post placement of a left Pleurx catheter 09/27/2014. Cytology on pleural fluid showed no malignant cells.   Disposition: [name] [name] appears unchanged. He now has a Pleurx catheter for the recurrent left pleural effusion. Most recent cytology was negative for malignant cells. We continue to suspect the effusion is likely malignant.   We are obtaining a followup chest x-ray today.  Dr. Benay Spice discussed with [name] [name] and his son that we will hold on beginning nivolumab until the Pleurx is removed.   He will return for a followup visit as scheduled on 11/10/2013 with a Port-A-Cath flush.  Patient seen with Dr. Benay Spice.  Ned Card ANP/GNP-BC   10/07/2014  11:00 AM  This  was a shared visit with Ned Card. The plan is to keep the  Pleurx in place until the pleural fluid resolves. We will then consider chemotherapy and nivolumab.  Julieanne Manson, M.D.

## 2014-10-07 NOTE — Telephone Encounter (Signed)
gv and pritned pt avs sento radiology

## 2014-10-17 ENCOUNTER — Ambulatory Visit: Payer: Medicare Other | Admitting: Family

## 2014-10-17 ENCOUNTER — Encounter (HOSPITAL_COMMUNITY): Payer: Medicare Other

## 2014-10-17 ENCOUNTER — Other Ambulatory Visit (HOSPITAL_COMMUNITY): Payer: Medicare Other

## 2014-10-24 LAB — AFB CULTURE WITH SMEAR (NOT AT ARMC): Acid Fast Smear: NONE SEEN

## 2014-11-02 ENCOUNTER — Telehealth: Payer: Self-pay | Admitting: *Deleted

## 2014-11-02 NOTE — Telephone Encounter (Signed)
Call from [place], [place] reporting pt's BP is lower than usual at 84/62 today. Due to have PluerX catheter drained. Also having increased sputum yellow/ brown in color. Afebrile. He fell today and hit is head. Appears uninjured. Asking if OK to drain catheter.  Reviewed with Dr. Benay Spice: OK to drain catheter, call PCP re: BP. Heather voiced understanding.

## 2014-11-04 ENCOUNTER — Encounter: Payer: Self-pay | Admitting: Cardiovascular Disease

## 2014-11-04 ENCOUNTER — Encounter: Payer: Self-pay | Admitting: Nurse Practitioner

## 2014-11-07 ENCOUNTER — Telehealth: Payer: Self-pay | Admitting: *Deleted

## 2014-11-07 NOTE — Telephone Encounter (Signed)
Spoke with Dub Amis (pt's son) re: letter for pt regarding cataract surgery.  Pt's son states he will pick up at front desk this afternoon.

## 2014-11-10 ENCOUNTER — Ambulatory Visit (HOSPITAL_BASED_OUTPATIENT_CLINIC_OR_DEPARTMENT_OTHER): Payer: Medicare Other | Admitting: Oncology

## 2014-11-10 ENCOUNTER — Telehealth: Payer: Self-pay | Admitting: Oncology

## 2014-11-10 ENCOUNTER — Telehealth: Payer: Self-pay | Admitting: *Deleted

## 2014-11-10 ENCOUNTER — Other Ambulatory Visit: Payer: Self-pay | Admitting: *Deleted

## 2014-11-10 VITALS — BP 96/58 | HR 62 | Temp 97.5°F | Resp 18 | Ht 71.0 in | Wt 189.3 lb

## 2014-11-10 DIAGNOSIS — I4891 Unspecified atrial fibrillation: Secondary | ICD-10-CM

## 2014-11-10 DIAGNOSIS — N189 Chronic kidney disease, unspecified: Secondary | ICD-10-CM

## 2014-11-10 DIAGNOSIS — J449 Chronic obstructive pulmonary disease, unspecified: Secondary | ICD-10-CM

## 2014-11-10 DIAGNOSIS — C3412 Malignant neoplasm of upper lobe, left bronchus or lung: Secondary | ICD-10-CM

## 2014-11-10 DIAGNOSIS — J90 Pleural effusion, not elsewhere classified: Secondary | ICD-10-CM

## 2014-11-10 DIAGNOSIS — G629 Polyneuropathy, unspecified: Secondary | ICD-10-CM

## 2014-11-10 DIAGNOSIS — E119 Type 2 diabetes mellitus without complications: Secondary | ICD-10-CM

## 2014-11-10 MED ORDER — TRAMADOL HCL 50 MG PO TABS: 50.0000 mg | ORAL_TABLET | Freq: Two times a day (BID) | ORAL | Status: DC | PRN

## 2014-11-10 NOTE — Telephone Encounter (Signed)
Gave avs & cal for Jan/Feb

## 2014-11-10 NOTE — Telephone Encounter (Signed)
Per staff phone call and POF I have schedueld appts. Scheduler advised of appts.  JMW  

## 2014-11-10 NOTE — Progress Notes (Signed)
  [place] OFFICE PROGRESS NOTE   Diagnosis: Non-small cell lung cancer  INTERVAL HISTORY:   [name] returns as scheduled. The Pleurx catheter continues to drain 400-500 mL. He is draining the catheter 3 days per week. His dyspnea improved following the drainage. No other complaint. He remains on home oxygen.  Objective:  Vital signs in last 24 hours:  Blood pressure 88/40, pulse 62, temperature 97.5 F (36.4 C), temperature source Oral, resp. rate 18, height 5\' 11"  (1.803 m), weight 189 lb 4.8 oz (85.866 kg), SpO2 99 %.    HEENT: Neck without mass Lymphatics: No cervical or supra-clavicular nodes Resp: Bronchial sounds at the left upper chest, good air movement bilaterally, no respiratory distress Cardio: Regular rate and rhythm GI: No hepatomegaly Vascular: No leg edema  Skin: Bandage in place at the left Pleurx   Portacath/PICC-without erythema  Lab Results:  Lab Results  Component Value Date   WBC 5.1 09/28/2014   HGB 10.8* 09/28/2014   HCT 33.0* 09/28/2014   MCV 90.9 09/28/2014   PLT 168 09/28/2014   NEUTROABS 2.9 09/10/2014     Medications: I have reviewed the patient's current medications.  Assessment/Plan: 1. 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   Initiation of chest radiation 04/05/2014. Radiation completed 05/30/2014.   Initiation of weekly Taxol/carboplatin chemotherapy 04/18/2014. Final weekly Taxol/carboplatin given 05/30/2014.  Restaging CT 07/11/2014 with a decrease in the left upper lobe mass, new bilateral pleural effusions  Chest CT 09/10/2014 showed a large left pleural effusion and slight increase in the size of the left upper lobe/paramediastinal mass. Cytology on the pleural fluid showed reactive mesothelial cells. 2. Admission 04/10/2014  with acute CHF and acute coronary syndrome, status post a cardiac catheterization 04/10/2014, clinical improvement with medical therapy.  3. COPD  4. Chronic renal failure  5. Diabetes  6. Neuropathy  7. History of non-Hodgkin's lymphoma  8. Atrial fibrillation.  9. Status post evaluation in the emergency department 05/13/2014 for chest pain. Question radiation esophagitis.  10. Oral candidiasis. He completed a 5 day course of Diflucan.  11. History of thrombocytopenia secondary to chemotherapy. The carboplatin was dose reduced on 05/30/2014.  12. 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 Persistent. 13. Hospitalization 07/23/2014 through 08/02/2014 with CHF. 14. Hospitalization 08/18/2014 through 08/23/2014 with CHF. 15. Bilateral pleural effusions on chest CT 07/11/2014. Status post thoracentesis 08/02/2014 and 08/22/2014. It does not appear fluid was sent for cytology. Status post thoracentesis 09/11/2014. Pathology showed reactive mesothelial cells. Status post placement of a left Pleurx catheter 09/27/2014. Cytology on pleural fluid showed no malignant cells.    Disposition:  [name] appears stable. There is persistent drainage from the left Pleurx catheter. We decided to begin a trial of Nivolumab. We reviewed the potential toxicities associated with this agent and he agrees to proceed. Hopefully the left pleural fluid will diminish while on treatment. We will check a chest x-ray when he returns for treatment next week. [name] will be scheduled for office visit and nivolumab 11/28/2014.  Betsy Coder, MD  11/10/2014  10:34 AM

## 2014-11-13 ENCOUNTER — Other Ambulatory Visit: Payer: Self-pay | Admitting: Oncology

## 2014-11-14 ENCOUNTER — Ambulatory Visit (HOSPITAL_BASED_OUTPATIENT_CLINIC_OR_DEPARTMENT_OTHER): Payer: Medicare Other

## 2014-11-14 DIAGNOSIS — C3412 Malignant neoplasm of upper lobe, left bronchus or lung: Secondary | ICD-10-CM

## 2014-11-14 DIAGNOSIS — Z5112 Encounter for antineoplastic immunotherapy: Secondary | ICD-10-CM

## 2014-11-14 DIAGNOSIS — C3492 Malignant neoplasm of unspecified part of left bronchus or lung: Secondary | ICD-10-CM

## 2014-11-14 MED ORDER — SODIUM CHLORIDE 0.9 % IV SOLN
3.0000 mg/kg | Freq: Once | INTRAVENOUS | Status: AC
  Administered 2014-11-14: 260 mg via INTRAVENOUS
  Filled 2014-11-14: qty 26

## 2014-11-14 MED ORDER — HEPARIN SOD (PORK) LOCK FLUSH 100 UNIT/ML IV SOLN
500.0000 [IU] | Freq: Once | INTRAVENOUS | Status: AC | PRN
  Administered 2014-11-14: 500 [IU]
  Filled 2014-11-14: qty 5

## 2014-11-14 MED ORDER — SODIUM CHLORIDE 0.9 % IV SOLN
Freq: Once | INTRAVENOUS | Status: AC
  Administered 2014-11-14: 15:00:00 via INTRAVENOUS

## 2014-11-14 MED ORDER — SODIUM CHLORIDE 0.9 % IJ SOLN
10.0000 mL | INTRAMUSCULAR | Status: DC | PRN
  Administered 2014-11-14: 10 mL
  Filled 2014-11-14: qty 10

## 2014-11-14 NOTE — Patient Instructions (Signed)
[facility] Discharge Instructions for Patients Receiving Chemotherapy  Today you received the following chemotherapy agents: Nivolumab  To help prevent nausea and vomiting after your treatment, we encourage you to take your nausea medication as prescribed by your physician.    If you develop nausea and vomiting that is not controlled by your nausea medication, call the clinic.   BELOW ARE SYMPTOMS THAT SHOULD BE REPORTED IMMEDIATELY:  *FEVER GREATER THAN 100.5 F  *CHILLS WITH OR WITHOUT FEVER  NAUSEA AND VOMITING THAT IS NOT CONTROLLED WITH YOUR NAUSEA MEDICATION  *UNUSUAL SHORTNESS OF BREATH  *UNUSUAL BRUISING OR BLEEDING  TENDERNESS IN MOUTH AND THROAT WITH OR WITHOUT PRESENCE OF ULCERS  *URINARY PROBLEMS  *BOWEL PROBLEMS  UNUSUAL RASH Items with * indicate a potential emergency and should be followed up as soon as possible.  Feel free to call the clinic you have any questions or concerns. The clinic phone number is [phone].

## 2014-11-16 ENCOUNTER — Telehealth: Payer: Self-pay

## 2014-11-16 NOTE — Telephone Encounter (Signed)
Spoke with son Nicole Kindred and he said that his father [name] [name] is doing well.  No problems with n/v or diarrhea.  He is taking in a lot of fluids.  His son knows to call [phone] if there are any concerns or questions.

## 2014-11-16 NOTE — Telephone Encounter (Signed)
-----   Message from [employer]. Quentin Cornwall, RN sent at 11/14/2014  2:46 PM EST ----- Regarding: new chemo Patient of Dr. Benay Spice, 1st time Nivolumab- has had chemo in the past.

## 2014-11-19 ENCOUNTER — Encounter: Payer: Self-pay | Admitting: Internal Medicine

## 2014-11-27 ENCOUNTER — Other Ambulatory Visit: Payer: Self-pay | Admitting: Oncology

## 2014-11-28 ENCOUNTER — Ambulatory Visit (HOSPITAL_BASED_OUTPATIENT_CLINIC_OR_DEPARTMENT_OTHER): Payer: Medicare Other

## 2014-11-28 ENCOUNTER — Telehealth: Payer: Self-pay | Admitting: Nurse Practitioner

## 2014-11-28 ENCOUNTER — Telehealth: Payer: Self-pay | Admitting: *Deleted

## 2014-11-28 ENCOUNTER — Ambulatory Visit (HOSPITAL_BASED_OUTPATIENT_CLINIC_OR_DEPARTMENT_OTHER): Payer: Medicare Other | Admitting: Nurse Practitioner

## 2014-11-28 VITALS — BP 95/49 | HR 72 | Temp 97.9°F | Resp 18 | Ht 71.0 in | Wt 190.5 lb

## 2014-11-28 DIAGNOSIS — C3490 Malignant neoplasm of unspecified part of unspecified bronchus or lung: Secondary | ICD-10-CM

## 2014-11-28 DIAGNOSIS — C3412 Malignant neoplasm of upper lobe, left bronchus or lung: Secondary | ICD-10-CM

## 2014-11-28 DIAGNOSIS — Z5112 Encounter for antineoplastic immunotherapy: Secondary | ICD-10-CM

## 2014-11-28 DIAGNOSIS — Z79899 Other long term (current) drug therapy: Secondary | ICD-10-CM

## 2014-11-28 DIAGNOSIS — C3492 Malignant neoplasm of unspecified part of left bronchus or lung: Secondary | ICD-10-CM

## 2014-11-28 DIAGNOSIS — Z8572 Personal history of non-Hodgkin lymphomas: Secondary | ICD-10-CM

## 2014-11-28 DIAGNOSIS — J918 Pleural effusion in other conditions classified elsewhere: Secondary | ICD-10-CM

## 2014-11-28 LAB — CBC WITH DIFFERENTIAL/PLATELET
BASO%: 1.1 % (ref 0.0–2.0)
BASOS ABS: 0.1 10*3/uL (ref 0.0–0.1)
EOS%: 1.8 % (ref 0.0–7.0)
Eosinophils Absolute: 0.1 10*3/uL (ref 0.0–0.5)
HCT: 35.3 % — ABNORMAL LOW (ref 38.4–49.9)
HEMOGLOBIN: 11.2 g/dL — AB (ref 13.0–17.1)
LYMPH%: 13 % — ABNORMAL LOW (ref 14.0–49.0)
MCH: 29.2 pg (ref 27.2–33.4)
MCHC: 31.8 g/dL — ABNORMAL LOW (ref 32.0–36.0)
MCV: 91.9 fL (ref 79.3–98.0)
MONO#: 1.1 10*3/uL — ABNORMAL HIGH (ref 0.1–0.9)
MONO%: 18.1 % — ABNORMAL HIGH (ref 0.0–14.0)
NEUT#: 3.9 10*3/uL (ref 1.5–6.5)
NEUT%: 66 % (ref 39.0–75.0)
Platelets: 181 10*3/uL (ref 140–400)
RBC: 3.85 10*6/uL — AB (ref 4.20–5.82)
RDW: 16.5 % — AB (ref 11.0–14.6)
WBC: 6 10*3/uL (ref 4.0–10.3)
lymph#: 0.8 10*3/uL — ABNORMAL LOW (ref 0.9–3.3)

## 2014-11-28 LAB — COMPREHENSIVE METABOLIC PANEL (CC13)
ALT: 14 U/L (ref 0–55)
AST: 17 U/L (ref 5–34)
Albumin: 2.8 g/dL — ABNORMAL LOW (ref 3.5–5.0)
Alkaline Phosphatase: 70 U/L (ref 40–150)
Anion Gap: 12 mEq/L — ABNORMAL HIGH (ref 3–11)
BILIRUBIN TOTAL: 0.72 mg/dL (ref 0.20–1.20)
BUN: 16.7 mg/dL (ref 7.0–26.0)
CO2: 24 mEq/L (ref 22–29)
CREATININE: 1 mg/dL (ref 0.7–1.3)
Calcium: 9.1 mg/dL (ref 8.4–10.4)
Chloride: 102 mEq/L (ref 98–109)
EGFR: 68 mL/min/{1.73_m2} — AB (ref >90)
GLUCOSE: 263 mg/dL — AB (ref 70–140)
Potassium: 4.5 mEq/L (ref 3.5–5.1)
Sodium: 138 mEq/L (ref 136–145)
TOTAL PROTEIN: 6.6 g/dL (ref 6.4–8.3)

## 2014-11-28 LAB — TSH CHCC: TSH: 1.471 m(IU)/L (ref 0.320–4.118)

## 2014-11-28 MED ORDER — HEPARIN SOD (PORK) LOCK FLUSH 100 UNIT/ML IV SOLN
500.0000 [IU] | Freq: Once | INTRAVENOUS | Status: AC | PRN
  Administered 2014-11-28: 500 [IU]
  Filled 2014-11-28: qty 5

## 2014-11-28 MED ORDER — SODIUM CHLORIDE 0.9 % IV SOLN
3.0000 mg/kg | Freq: Once | INTRAVENOUS | Status: AC
  Administered 2014-11-28: 260 mg via INTRAVENOUS
  Filled 2014-11-28: qty 26

## 2014-11-28 MED ORDER — SODIUM CHLORIDE 0.9 % IJ SOLN
10.0000 mL | INTRAMUSCULAR | Status: DC | PRN
  Administered 2014-11-28: 10 mL
  Filled 2014-11-28: qty 10

## 2014-11-28 MED ORDER — SODIUM CHLORIDE 0.9 % IV SOLN
Freq: Once | INTRAVENOUS | Status: AC
  Administered 2014-11-28: 11:00:00 via INTRAVENOUS

## 2014-11-28 NOTE — Telephone Encounter (Signed)
Per staff message and POF I have scheduled appts. Advised scheduler of appts. JMW  

## 2014-11-28 NOTE — Telephone Encounter (Signed)
Pt confirmed labs/ov per 02/01 POF, gave pt AVS.... KJ, labs added on today and registered already and gave pt AVS/Cal in chemo room

## 2014-11-28 NOTE — Telephone Encounter (Signed)
Closing encounter

## 2014-11-28 NOTE — Progress Notes (Signed)
[name] OFFICE PROGRESS NOTE   Diagnosis:  Non-small cell lung cancer  INTERVAL HISTORY:   [name] [name] returns as scheduled. [name] [name] completed cycle 1 Nivolumab on 11/14/2014. No change in baseline bowel habits of 2-4 loose stools a day. No skin rash. [name] [name] has stable dyspnea. The Pleurx catheter is being drained on a Monday Wednesday Friday schedule. [name] [name] estimates 300-400 mL drained each time. [name] [name] has intermittent pain at the left flank. The pain increases with each drainage procedure. [name] [name] takes Ultram as needed. [name] [name] has a good appetite. [name] [name] is gaining weight.  Objective:  Vital signs in last 24 hours:  Blood pressure 95/49, pulse 72, temperature 97.9 F (36.6 C), temperature source Axillary, resp. rate 18, height 5\' 11"  (1.803 m), weight 190 lb 8 oz (86.41 kg), SpO2 95 %.    HEENT: No thrush or ulcers. Resp: Lungs are clear. Sounds diminished at the left base. Cardio: Regular rate and rhythm. GI: Abdomen soft and nontender. No hepatomegaly. Vascular: No leg edema. Skin: Left Pleurx catheter site is bandaged. Port-A-Cath without erythema.    Lab Results:  Lab Results  Component Value Date   WBC 5.1 09/28/2014   HGB 10.8* 09/28/2014   HCT 33.0* 09/28/2014   MCV 90.9 09/28/2014   PLT 168 09/28/2014   NEUTROABS 2.9 09/10/2014    Imaging:  No results found.  Medications: I have reviewed the patient's current medications.  Assessment/Plan: 1. 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   Initiation of chest radiation 04/05/2014. Radiation completed 05/30/2014.   Initiation of weekly Taxol/carboplatin chemotherapy 04/18/2014. Final weekly Taxol/carboplatin given 05/30/2014.  Restaging CT 07/11/2014 with a decrease in the left upper lobe mass, new bilateral pleural effusions  Chest CT 09/10/2014  showed a large left pleural effusion and slight increase in the size of the left upper lobe/paramediastinal mass. Cytology on the pleural fluid showed reactive mesothelial cells.  Cycle 1 Nivolumab 11/14/2014 2. Admission 04/10/2014 with acute CHF and acute coronary syndrome, status post a cardiac catheterization 04/10/2014, clinical improvement with medical therapy.  3. COPD  4. Chronic renal failure  5. Diabetes  6. Neuropathy  7. History of non-Hodgkin's lymphoma  8. Atrial fibrillation.  9. Status post evaluation in the emergency department 05/13/2014 for chest pain. Question radiation esophagitis.  10. Oral candidiasis. [name] [name] completed a 5 day course of Diflucan.  11. History of thrombocytopenia secondary to chemotherapy. The carboplatin was dose reduced on 05/30/2014.  12. 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 Persistent. 13. Hospitalization 07/23/2014 through 08/02/2014 with CHF. 14. Hospitalization 08/18/2014 through 08/23/2014 with CHF. 15. Bilateral pleural effusions on chest CT 07/11/2014. Status post thoracentesis 08/02/2014 and 08/22/2014. It does not appear fluid was sent for cytology. Status post thoracentesis 09/11/2014. Pathology showed reactive mesothelial cells. Status post placement of a left Pleurx catheter 09/27/2014. Cytology on pleural fluid showed no malignant cells. 16. Intermittent pain left flank region. Likely related to the Pleurx catheter.     Disposition: [name] [name] appears stable. [name] [name] has completed one treatment with Nivolumab. Plan to proceed with the second treatment today as scheduled. [name] [name] will return for a follow-up visit and cycle 3 in 2 weeks. [name] [name] will contact the office in the interim with any problems. We specifically discussed increased pain at the left flank region.  Plan reviewed with Dr. Benay Spice.    [name] [name],  [name] [name] ANP/GNP-BC   11/28/2014  10:21 AM

## 2014-11-28 NOTE — Patient Instructions (Signed)
[facility] Discharge Instructions for Patients Receiving Chemotherapy  Today you received the following chemotherapy agents: Nivolumab  To help prevent nausea and vomiting after your treatment, we encourage you to take your nausea medication as prescribed by your physician.    If you develop nausea and vomiting that is not controlled by your nausea medication, call the clinic.   BELOW ARE SYMPTOMS THAT SHOULD BE REPORTED IMMEDIATELY:  *FEVER GREATER THAN 100.5 F  *CHILLS WITH OR WITHOUT FEVER  NAUSEA AND VOMITING THAT IS NOT CONTROLLED WITH YOUR NAUSEA MEDICATION  *UNUSUAL SHORTNESS OF BREATH  *UNUSUAL BRUISING OR BLEEDING  TENDERNESS IN MOUTH AND THROAT WITH OR WITHOUT PRESENCE OF ULCERS  *URINARY PROBLEMS  *BOWEL PROBLEMS  UNUSUAL RASH Items with * indicate a potential emergency and should be followed up as soon as possible.  Feel free to call the clinic you have any questions or concerns. The clinic phone number is [phone].

## 2014-12-11 ENCOUNTER — Other Ambulatory Visit: Payer: Self-pay | Admitting: Oncology

## 2014-12-12 ENCOUNTER — Ambulatory Visit (HOSPITAL_BASED_OUTPATIENT_CLINIC_OR_DEPARTMENT_OTHER): Payer: Medicare Other

## 2014-12-12 ENCOUNTER — Telehealth: Payer: Self-pay | Admitting: *Deleted

## 2014-12-12 ENCOUNTER — Other Ambulatory Visit (HOSPITAL_BASED_OUTPATIENT_CLINIC_OR_DEPARTMENT_OTHER): Payer: Medicare Other

## 2014-12-12 ENCOUNTER — Ambulatory Visit (HOSPITAL_BASED_OUTPATIENT_CLINIC_OR_DEPARTMENT_OTHER): Payer: Medicare Other | Admitting: Nurse Practitioner

## 2014-12-12 ENCOUNTER — Telehealth: Payer: Self-pay | Admitting: Nurse Practitioner

## 2014-12-12 VITALS — BP 94/52 | HR 77 | Temp 97.4°F | Resp 17 | Ht 71.0 in | Wt 196.5 lb

## 2014-12-12 DIAGNOSIS — C3410 Malignant neoplasm of upper lobe, unspecified bronchus or lung: Secondary | ICD-10-CM

## 2014-12-12 DIAGNOSIS — C3490 Malignant neoplasm of unspecified part of unspecified bronchus or lung: Secondary | ICD-10-CM

## 2014-12-12 DIAGNOSIS — G629 Polyneuropathy, unspecified: Secondary | ICD-10-CM

## 2014-12-12 DIAGNOSIS — Z8572 Personal history of non-Hodgkin lymphomas: Secondary | ICD-10-CM

## 2014-12-12 DIAGNOSIS — J90 Pleural effusion, not elsewhere classified: Secondary | ICD-10-CM

## 2014-12-12 DIAGNOSIS — C3412 Malignant neoplasm of upper lobe, left bronchus or lung: Secondary | ICD-10-CM

## 2014-12-12 DIAGNOSIS — Z79899 Other long term (current) drug therapy: Secondary | ICD-10-CM

## 2014-12-12 DIAGNOSIS — C3492 Malignant neoplasm of unspecified part of left bronchus or lung: Secondary | ICD-10-CM

## 2014-12-12 DIAGNOSIS — Z5112 Encounter for antineoplastic immunotherapy: Secondary | ICD-10-CM

## 2014-12-12 LAB — CBC WITH DIFFERENTIAL/PLATELET
BASO%: 0.1 % (ref 0.0–2.0)
BASOS ABS: 0 10*3/uL (ref 0.0–0.1)
EOS%: 2.2 % (ref 0.0–7.0)
Eosinophils Absolute: 0.1 10*3/uL (ref 0.0–0.5)
HCT: 34.9 % — ABNORMAL LOW (ref 38.4–49.9)
HEMOGLOBIN: 11.2 g/dL — AB (ref 13.0–17.1)
LYMPH#: 0.6 10*3/uL — AB (ref 0.9–3.3)
LYMPH%: 10.1 % — ABNORMAL LOW (ref 14.0–49.0)
MCH: 29.6 pg (ref 27.2–33.4)
MCHC: 32 g/dL (ref 32.0–36.0)
MCV: 92.5 fL (ref 79.3–98.0)
MONO#: 1 10*3/uL — ABNORMAL HIGH (ref 0.1–0.9)
MONO%: 17.2 % — AB (ref 0.0–14.0)
NEUT%: 70.4 % (ref 39.0–75.0)
NEUTROS ABS: 3.9 10*3/uL (ref 1.5–6.5)
PLATELETS: 171 10*3/uL (ref 140–400)
RBC: 3.77 10*6/uL — ABNORMAL LOW (ref 4.20–5.82)
RDW: 15.7 % — AB (ref 11.0–14.6)
WBC: 5.5 10*3/uL (ref 4.0–10.3)

## 2014-12-12 LAB — COMPREHENSIVE METABOLIC PANEL (CC13)
ALK PHOS: 78 U/L (ref 40–150)
ALT: 23 U/L (ref 0–55)
AST: 28 U/L (ref 5–34)
Albumin: 2.8 g/dL — ABNORMAL LOW (ref 3.5–5.0)
Anion Gap: 9 mEq/L (ref 3–11)
BILIRUBIN TOTAL: 0.85 mg/dL (ref 0.20–1.20)
BUN: 14.3 mg/dL (ref 7.0–26.0)
CO2: 27 mEq/L (ref 22–29)
Calcium: 9 mg/dL (ref 8.4–10.4)
Chloride: 99 mEq/L (ref 98–109)
Creatinine: 0.9 mg/dL (ref 0.7–1.3)
EGFR: 77 mL/min/{1.73_m2} — ABNORMAL LOW (ref >90)
Glucose: 248 mg/dl — ABNORMAL HIGH (ref 70–140)
POTASSIUM: 4 meq/L (ref 3.5–5.1)
SODIUM: 135 meq/L — AB (ref 136–145)
Total Protein: 6.7 g/dL (ref 6.4–8.3)

## 2014-12-12 LAB — TSH CHCC: TSH: 1.093 m[IU]/L (ref 0.320–4.118)

## 2014-12-12 MED ORDER — SODIUM CHLORIDE 0.9 % IV SOLN
3.0000 mg/kg | Freq: Once | INTRAVENOUS | Status: AC
  Administered 2014-12-12: 260 mg via INTRAVENOUS
  Filled 2014-12-12: qty 26

## 2014-12-12 MED ORDER — SODIUM CHLORIDE 0.9 % IV SOLN
Freq: Once | INTRAVENOUS | Status: AC
  Administered 2014-12-12: 12:00:00 via INTRAVENOUS

## 2014-12-12 MED ORDER — SODIUM CHLORIDE 0.9 % IJ SOLN
10.0000 mL | INTRAMUSCULAR | Status: DC | PRN
  Administered 2014-12-12: 10 mL
  Filled 2014-12-12: qty 10

## 2014-12-12 MED ORDER — HEPARIN SOD (PORK) LOCK FLUSH 100 UNIT/ML IV SOLN
500.0000 [IU] | Freq: Once | INTRAVENOUS | Status: AC | PRN
  Administered 2014-12-12: 500 [IU]
  Filled 2014-12-12: qty 5

## 2014-12-12 NOTE — Patient Instructions (Signed)
[facility] Discharge Instructions for Patients Receiving Chemotherapy  Today you received the following chemotherapy agents Opdivo  To help prevent nausea and vomiting after your treatment, we encourage you to take your nausea medication as directed/prescribed   If you develop nausea and vomiting that is not controlled by your nausea medication, call the clinic.   BELOW ARE SYMPTOMS THAT SHOULD BE REPORTED IMMEDIATELY:  *FEVER GREATER THAN 100.5 F  *CHILLS WITH OR WITHOUT FEVER  NAUSEA AND VOMITING THAT IS NOT CONTROLLED WITH YOUR NAUSEA MEDICATION  *UNUSUAL SHORTNESS OF BREATH  *UNUSUAL BRUISING OR BLEEDING  TENDERNESS IN MOUTH AND THROAT WITH OR WITHOUT PRESENCE OF ULCERS  *URINARY PROBLEMS  *BOWEL PROBLEMS  UNUSUAL RASH Items with * indicate a potential emergency and should be followed up as soon as possible.  Feel free to call the clinic you have any questions or concerns. The clinic phone number is [phone].

## 2014-12-12 NOTE — Progress Notes (Signed)
[place] OFFICE PROGRESS NOTE   Diagnosis:  Non-small cell lung cancer  INTERVAL HISTORY:   [name] [name] returns as scheduled. He completed cycle 2 nivolumab on 11/28/2014. He continues to have intermittent loose stools which predated the start of nivolumab. He estimates 2-3 bowel movements a day. No nausea or vomiting. No skin rash. No mouth sores. He denies dyspnea. He continues to have a cough. He intermittently expectorates phlegm. No hemoptysis. He denies fever. The Pleurx catheter is being drained on a Monday Wednesday Friday schedule. He estimates 250-350 mL drained each time. No significant pain with the drainage procedures.  Objective:  Vital signs in last 24 hours:  Blood pressure 94/52, pulse 77, temperature 97.4 F (36.3 C), temperature source Oral, resp. rate 17, height 5\' 11"  (1.803 m), weight 196 lb 8 oz (89.132 kg), SpO2 98 %, peak flow 3 L/min.    HEENT: Mild white coating over tongue. Resp: Lungs are clear. Left-sided Pleurx catheter. Cardio: Regular rate and rhythm. GI: Abdomen soft and nontender. No hepatomegaly. Vascular: No leg edema.  Skin: No rash. Left Pleurx catheter site is bandaged. Port-A-Cath without erythema.    Lab Results:  Lab Results  Component Value Date   WBC 5.5 12/12/2014   HGB 11.2* 12/12/2014   HCT 34.9* 12/12/2014   MCV 92.5 12/12/2014   PLT 171 12/12/2014   NEUTROABS 3.9 12/12/2014    Imaging:  No results found.  Medications: I have reviewed the patient's current medications.  Assessment/Plan: 1. 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   Initiation of chest radiation 04/05/2014. Radiation completed 05/30/2014.   Initiation of weekly Taxol/carboplatin chemotherapy 04/18/2014. Final weekly Taxol/carboplatin given  05/30/2014.  Restaging CT 07/11/2014 with a decrease in the left upper lobe mass, new bilateral pleural effusions  Chest CT 09/10/2014 showed a large left pleural effusion and slight increase in the size of the left upper lobe/paramediastinal mass. Cytology on the pleural fluid showed reactive mesothelial cells.  Cycle 1 Nivolumab 11/14/2014  Cycle 2 nivolumab 11/28/2014 2. Admission 04/10/2014 with acute CHF and acute coronary syndrome, status post a cardiac catheterization 04/10/2014, clinical improvement with medical therapy.  3. COPD  4. Chronic renal failure  5. Diabetes  6. Neuropathy  7. History of non-Hodgkin's lymphoma  8. Atrial fibrillation.  9. Status post evaluation in the emergency department 05/13/2014 for chest pain. Question radiation esophagitis.  10. Oral candidiasis. He completed a 5 day course of Diflucan.  11. History of thrombocytopenia secondary to chemotherapy. The carboplatin was dose reduced on 05/30/2014.  12. 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 Persistent. 13. Hospitalization 07/23/2014 through 08/02/2014 with CHF. 14. Hospitalization 08/18/2014 through 08/23/2014 with CHF. 15. Bilateral pleural effusions on chest CT 07/11/2014. Status post thoracentesis 08/02/2014 and 08/22/2014. It does not appear fluid was sent for cytology. Status post thoracentesis 09/11/2014. Pathology showed reactive mesothelial cells. Status post placement of a left Pleurx catheter 09/27/2014. Cytology on pleural fluid showed no malignant cells. 16. Intermittent pain left flank region. Likely related to the Pleurx catheter. Improved.   Disposition: [name] [name] appears stable. [name] completed 2 cycles of nivolumab. Plan to proceed with cycle 3 today as scheduled.   He will return for cycle 4 nivolumab in 2 weeks. We will obtain a chest x-ray at that time.   We will see him in  follow-up in 4 weeks. The plan is to repeat a chest  CT following 6 cycles of nivolumab.   We instructed him to change the Pleurx catheter drainage schedule from Monday Wednesday Friday to Tuesday Friday.  He will contact the office prior to his next visit with any problems.  Plan reviewed with Dr. Benay Spice.    Ned Card ANP/GNP-BC   12/12/2014  11:34 AM

## 2014-12-12 NOTE — Telephone Encounter (Signed)
Per staff message and POF I have scheduled appts. Advised scheduler of appts. JMW  

## 2014-12-12 NOTE — Telephone Encounter (Signed)
Gave avs & calendar for March. Sent message to schedule treatment

## 2014-12-25 ENCOUNTER — Other Ambulatory Visit: Payer: Self-pay | Admitting: Oncology

## 2014-12-26 ENCOUNTER — Ambulatory Visit (HOSPITAL_COMMUNITY)
Admission: RE | Admit: 2014-12-26 | Discharge: 2014-12-26 | Disposition: A | Discharge status: Home / Self Care | Payer: Medicare Other | Source: Ambulatory Visit | Attending: Nurse Practitioner | Admitting: Nurse Practitioner

## 2014-12-26 ENCOUNTER — Other Ambulatory Visit (HOSPITAL_BASED_OUTPATIENT_CLINIC_OR_DEPARTMENT_OTHER): Payer: Medicare Other

## 2014-12-26 ENCOUNTER — Ambulatory Visit (HOSPITAL_BASED_OUTPATIENT_CLINIC_OR_DEPARTMENT_OTHER): Payer: Medicare Other

## 2014-12-26 DIAGNOSIS — Z79899 Other long term (current) drug therapy: Secondary | ICD-10-CM

## 2014-12-26 DIAGNOSIS — C3412 Malignant neoplasm of upper lobe, left bronchus or lung: Secondary | ICD-10-CM

## 2014-12-26 DIAGNOSIS — Z5112 Encounter for antineoplastic immunotherapy: Secondary | ICD-10-CM

## 2014-12-26 DIAGNOSIS — C3490 Malignant neoplasm of unspecified part of unspecified bronchus or lung: Secondary | ICD-10-CM

## 2014-12-26 DIAGNOSIS — C3410 Malignant neoplasm of upper lobe, unspecified bronchus or lung: Secondary | ICD-10-CM

## 2014-12-26 DIAGNOSIS — C3492 Malignant neoplasm of unspecified part of left bronchus or lung: Secondary | ICD-10-CM

## 2014-12-26 LAB — COMPREHENSIVE METABOLIC PANEL (CC13)
ALT: 32 U/L (ref 0–55)
ANION GAP: 9 meq/L (ref 3–11)
AST: 39 U/L — ABNORMAL HIGH (ref 5–34)
Albumin: 2.8 g/dL — ABNORMAL LOW (ref 3.5–5.0)
Alkaline Phosphatase: 104 U/L (ref 40–150)
BILIRUBIN TOTAL: 0.92 mg/dL (ref 0.20–1.20)
BUN: 13.8 mg/dL (ref 7.0–26.0)
CALCIUM: 8.5 mg/dL (ref 8.4–10.4)
CO2: 26 meq/L (ref 22–29)
Chloride: 99 mEq/L (ref 98–109)
Creatinine: 1.1 mg/dL (ref 0.7–1.3)
EGFR: 64 mL/min/{1.73_m2} — ABNORMAL LOW (ref >90)
GLUCOSE: 330 mg/dL — AB (ref 70–140)
Potassium: 4.4 mEq/L (ref 3.5–5.1)
SODIUM: 134 meq/L — AB (ref 136–145)
TOTAL PROTEIN: 6.3 g/dL — AB (ref 6.4–8.3)

## 2014-12-26 LAB — TSH CHCC: TSH: 1.238 m(IU)/L (ref 0.320–4.118)

## 2014-12-26 LAB — CBC WITH DIFFERENTIAL/PLATELET
BASO%: 0.5 % (ref 0.0–2.0)
BASOS ABS: 0 10*3/uL (ref 0.0–0.1)
EOS ABS: 0 10*3/uL (ref 0.0–0.5)
EOS%: 0.8 % (ref 0.0–7.0)
HCT: 33.9 % — ABNORMAL LOW (ref 38.4–49.9)
HGB: 11.1 g/dL — ABNORMAL LOW (ref 13.0–17.1)
LYMPH%: 24.1 % (ref 14.0–49.0)
MCH: 29.6 pg (ref 27.2–33.4)
MCHC: 32.7 g/dL (ref 32.0–36.0)
MCV: 90.4 fL (ref 79.3–98.0)
MONO#: 0.7 10*3/uL (ref 0.1–0.9)
MONO%: 18.8 % — ABNORMAL HIGH (ref 0.0–14.0)
NEUT#: 2.2 10*3/uL (ref 1.5–6.5)
NEUT%: 55.8 % (ref 39.0–75.0)
Platelets: 133 10*3/uL — ABNORMAL LOW (ref 140–400)
RBC: 3.75 10*6/uL — ABNORMAL LOW (ref 4.20–5.82)
RDW: 14.7 % — AB (ref 11.0–14.6)
WBC: 3.9 10*3/uL — AB (ref 4.0–10.3)
lymph#: 1 10*3/uL (ref 0.9–3.3)
nRBC: 0 % (ref 0–0)

## 2014-12-26 MED ORDER — SODIUM CHLORIDE 0.9 % IV SOLN
Freq: Once | INTRAVENOUS | Status: AC
  Administered 2014-12-26: 12:00:00 via INTRAVENOUS

## 2014-12-26 MED ORDER — SODIUM CHLORIDE 0.9 % IJ SOLN
10.0000 mL | INTRAMUSCULAR | Status: DC | PRN
  Administered 2014-12-26: 10 mL
  Filled 2014-12-26: qty 10

## 2014-12-26 MED ORDER — HEPARIN SOD (PORK) LOCK FLUSH 100 UNIT/ML IV SOLN
500.0000 [IU] | Freq: Once | INTRAVENOUS | Status: AC | PRN
Start: 2014-12-26 — End: 2014-12-26
  Administered 2014-12-26: 500 [IU]
  Filled 2014-12-26: qty 5

## 2014-12-26 MED ORDER — SODIUM CHLORIDE 0.9 % IV SOLN
3.0000 mg/kg | Freq: Once | INTRAVENOUS | Status: AC
  Administered 2014-12-26: 260 mg via INTRAVENOUS
  Filled 2014-12-26: qty 26

## 2014-12-26 NOTE — Patient Instructions (Signed)
[facility] Discharge Instructions for Patients Receiving Chemotherapy  Today you received the following chemotherapy agents: Nivolumab To help prevent nausea and vomiting after your treatment, we encourage you to take your nausea medication: as directed.   If you develop nausea and vomiting that is not controlled by your nausea medication, call the clinic.   BELOW ARE SYMPTOMS THAT SHOULD BE REPORTED IMMEDIATELY:  *FEVER GREATER THAN 100.5 F  *CHILLS WITH OR WITHOUT FEVER  NAUSEA AND VOMITING THAT IS NOT CONTROLLED WITH YOUR NAUSEA MEDICATION  *UNUSUAL SHORTNESS OF BREATH  *UNUSUAL BRUISING OR BLEEDING  TENDERNESS IN MOUTH AND THROAT WITH OR WITHOUT PRESENCE OF ULCERS  *URINARY PROBLEMS  *BOWEL PROBLEMS  UNUSUAL RASH Items with * indicate a potential emergency and should be followed up as soon as possible.  Feel free to call the clinic you have any questions or concerns. The clinic phone number is [phone].

## 2015-01-08 ENCOUNTER — Other Ambulatory Visit: Payer: Self-pay | Admitting: Oncology

## 2015-01-09 ENCOUNTER — Other Ambulatory Visit: Payer: Self-pay | Admitting: *Deleted

## 2015-01-09 ENCOUNTER — Other Ambulatory Visit (HOSPITAL_BASED_OUTPATIENT_CLINIC_OR_DEPARTMENT_OTHER): Payer: Medicare Other

## 2015-01-09 ENCOUNTER — Ambulatory Visit (HOSPITAL_COMMUNITY)
Admission: RE | Admit: 2015-01-09 | Discharge: 2015-01-09 | Disposition: A | Discharge status: Home / Self Care | Payer: Medicare Other | Source: Ambulatory Visit | Attending: Oncology | Admitting: Oncology

## 2015-01-09 ENCOUNTER — Ambulatory Visit (HOSPITAL_BASED_OUTPATIENT_CLINIC_OR_DEPARTMENT_OTHER): Payer: Medicare Other

## 2015-01-09 ENCOUNTER — Ambulatory Visit (HOSPITAL_BASED_OUTPATIENT_CLINIC_OR_DEPARTMENT_OTHER): Payer: Medicare Other | Admitting: Oncology

## 2015-01-09 ENCOUNTER — Telehealth: Payer: Self-pay | Admitting: *Deleted

## 2015-01-09 ENCOUNTER — Telehealth: Payer: Self-pay | Admitting: Oncology

## 2015-01-09 VITALS — BP 87/50 | HR 69 | Temp 98.0°F | Resp 17 | Ht 71.0 in | Wt 194.3 lb

## 2015-01-09 DIAGNOSIS — C3490 Malignant neoplasm of unspecified part of unspecified bronchus or lung: Secondary | ICD-10-CM

## 2015-01-09 DIAGNOSIS — R0602 Shortness of breath: Secondary | ICD-10-CM | POA: Insufficient documentation

## 2015-01-09 DIAGNOSIS — C3412 Malignant neoplasm of upper lobe, left bronchus or lung: Secondary | ICD-10-CM

## 2015-01-09 DIAGNOSIS — C3410 Malignant neoplasm of upper lobe, unspecified bronchus or lung: Secondary | ICD-10-CM

## 2015-01-09 DIAGNOSIS — C8590 Non-Hodgkin lymphoma, unspecified, unspecified site: Secondary | ICD-10-CM | POA: Insufficient documentation

## 2015-01-09 DIAGNOSIS — Z79899 Other long term (current) drug therapy: Secondary | ICD-10-CM

## 2015-01-09 DIAGNOSIS — R918 Other nonspecific abnormal finding of lung field: Secondary | ICD-10-CM | POA: Insufficient documentation

## 2015-01-09 DIAGNOSIS — D491 Neoplasm of unspecified behavior of respiratory system: Secondary | ICD-10-CM | POA: Diagnosis not present

## 2015-01-09 DIAGNOSIS — C3492 Malignant neoplasm of unspecified part of left bronchus or lung: Secondary | ICD-10-CM

## 2015-01-09 DIAGNOSIS — Z5112 Encounter for antineoplastic immunotherapy: Secondary | ICD-10-CM

## 2015-01-09 LAB — COMPREHENSIVE METABOLIC PANEL (CC13)
ALBUMIN: 2.5 g/dL — AB (ref 3.5–5.0)
ALT: 13 U/L (ref 0–55)
AST: 13 U/L (ref 5–34)
Alkaline Phosphatase: 76 U/L (ref 40–150)
Anion Gap: 11 mEq/L (ref 3–11)
BUN: 14.7 mg/dL (ref 7.0–26.0)
CALCIUM: 9.1 mg/dL (ref 8.4–10.4)
CO2: 28 mEq/L (ref 22–29)
Chloride: 95 mEq/L — ABNORMAL LOW (ref 98–109)
Creatinine: 1.1 mg/dL (ref 0.7–1.3)
EGFR: 66 mL/min/{1.73_m2} — ABNORMAL LOW (ref >90)
GLUCOSE: 270 mg/dL — AB (ref 70–140)
Potassium: 4 mEq/L (ref 3.5–5.1)
SODIUM: 134 meq/L — AB (ref 136–145)
Total Bilirubin: 0.94 mg/dL (ref 0.20–1.20)
Total Protein: 6.8 g/dL (ref 6.4–8.3)

## 2015-01-09 LAB — CBC WITH DIFFERENTIAL/PLATELET
BASO%: 0.1 % (ref 0.0–2.0)
BASOS ABS: 0 10*3/uL (ref 0.0–0.1)
EOS ABS: 0 10*3/uL (ref 0.0–0.5)
EOS%: 0 % (ref 0.0–7.0)
HCT: 32.4 % — ABNORMAL LOW (ref 38.4–49.9)
HEMOGLOBIN: 10.5 g/dL — AB (ref 13.0–17.1)
LYMPH#: 0.8 10*3/uL — AB (ref 0.9–3.3)
LYMPH%: 11.3 % — ABNORMAL LOW (ref 14.0–49.0)
MCH: 29.7 pg (ref 27.2–33.4)
MCHC: 32.4 g/dL (ref 32.0–36.0)
MCV: 91.8 fL (ref 79.3–98.0)
MONO#: 1.1 10*3/uL — ABNORMAL HIGH (ref 0.1–0.9)
MONO%: 15.8 % — ABNORMAL HIGH (ref 0.0–14.0)
NEUT%: 72.8 % (ref 39.0–75.0)
NEUTROS ABS: 5.1 10*3/uL (ref 1.5–6.5)
PLATELETS: 203 10*3/uL (ref 140–400)
RBC: 3.53 10*6/uL — ABNORMAL LOW (ref 4.20–5.82)
RDW: 13.9 % (ref 11.0–14.6)
WBC: 7 10*3/uL (ref 4.0–10.3)

## 2015-01-09 LAB — TSH CHCC: TSH: 1.16 m[IU]/L (ref 0.320–4.118)

## 2015-01-09 MED ORDER — SODIUM CHLORIDE 0.9 % IV SOLN
Freq: Once | INTRAVENOUS | Status: AC
  Administered 2015-01-09: 12:00:00 via INTRAVENOUS

## 2015-01-09 MED ORDER — TRAMADOL HCL 50 MG PO TABS: 50.0000 mg | ORAL_TABLET | Freq: Two times a day (BID) | ORAL | Status: AC | PRN

## 2015-01-09 MED ORDER — SODIUM CHLORIDE 0.9 % IJ SOLN
10.0000 mL | INTRAMUSCULAR | Status: DC | PRN
  Administered 2015-01-09: 10 mL
  Filled 2015-01-09: qty 10

## 2015-01-09 MED ORDER — SODIUM CHLORIDE 0.9 % IV SOLN
3.0000 mg/kg | Freq: Once | INTRAVENOUS | Status: AC
  Administered 2015-01-09: 260 mg via INTRAVENOUS
  Filled 2015-01-09: qty 26

## 2015-01-09 MED ORDER — HEPARIN SOD (PORK) LOCK FLUSH 100 UNIT/ML IV SOLN
500.0000 [IU] | Freq: Once | INTRAVENOUS | Status: AC | PRN
  Administered 2015-01-09: 500 [IU]
  Filled 2015-01-09: qty 5

## 2015-01-09 NOTE — Telephone Encounter (Signed)
gv and printed appt sched and avs for pt for march and April...sed added tx.

## 2015-01-09 NOTE — Telephone Encounter (Signed)
Nira Conn, nurse with [facility], called to say that [name] [name] is having more pain that usual on the side with the Pleurex catheter.  She drained the pleural space this morning and obtained 250 cc of blood tinged fluid.  She said this is a change for him, there is usually not this much blood in the fluid.  [name] [name] sees Dr. Benay Spice this afternoon and she is asking that someone call her after his visit at [phone].

## 2015-01-09 NOTE — Progress Notes (Signed)
[place] OFFICE PROGRESS NOTE   Diagnosis: Non-small cell lung cancer  INTERVAL HISTORY:   [name] returns as scheduled. The Pleurx catheter is drained twice weekly. The catheter was draining for 250 mL of blood-tinged fluid today. He reports 400 mL were drained on Friday. He has increased discomfort at the left chest. The pain is relieved with tramadol. Good appetite. He ambulates in the home. No diarrhea or skin rash.  Objective:  Vital signs in last 24 hours:  Blood pressure 87/50, pulse 69, temperature 98 F (36.7 C), temperature source Oral, resp. rate 17, height 5\' 11"  (1.803 m), weight 194 lb 4.8 oz (88.134 kg), SpO2 93 %, peak flow 3 L/min.    HEENT: No thrush or ulcers Resp: Bronchial sounds at the left upper posterior chest, no respiratory distress Cardio: Regular rate and rhythm GI: No hepatosplenomegaly Vascular: No leg edema Skin: Dry gauze dressing at the left chest Pleurx site   Portacath/PICC-without erythema  Lab Results:  Lab Results  Component Value Date   WBC 7.0 01/09/2015   HGB 10.5* 01/09/2015   HCT 32.4* 01/09/2015   MCV 91.8 01/09/2015   PLT 203 01/09/2015   NEUTROABS 5.1 01/09/2015     Medications: I have reviewed the patient's current medications.  Assessment/Plan: 1. 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   Initiation of chest radiation 04/05/2014. Radiation completed 05/30/2014.   Initiation of weekly Taxol/carboplatin chemotherapy 04/18/2014. Final weekly Taxol/carboplatin given 05/30/2014.  Restaging CT 07/11/2014 with a decrease in the left upper lobe mass, new bilateral pleural effusions  Chest CT 09/10/2014 showed a large left pleural effusion and slight increase in the size of the left upper lobe/paramediastinal mass. Cytology on  the pleural fluid showed reactive mesothelial cells.  Cycle 1 Nivolumab 11/14/2014  Cycle 2 nivolumab 11/28/2014  Cycle 3 nivolumab 12/12/2014  Cycle 4 nivolumab 12/26/2014   Cycle 5  nivolumab 01/09/2015  2. Admission 04/10/2014 with acute CHF and acute coronary syndrome, status post a cardiac catheterization 04/10/2014, clinical improvement with medical therapy.  3. COPD  4. Chronic renal failure  5. Diabetes  6. Neuropathy  7. History of non-Hodgkin's lymphoma  8. Atrial fibrillation.  9. Status post evaluation in the emergency department 05/13/2014 for chest pain. Question radiation esophagitis.  10. Oral candidiasis. He completed a 5 day course of Diflucan.  11. History of thrombocytopenia secondary to chemotherapy. The carboplatin was dose reduced on 05/30/2014.  12. 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 Persistent. 13. Hospitalization 07/23/2014 through 08/02/2014 with CHF. 14. Hospitalization 08/18/2014 through 08/23/2014 with CHF. 15. Bilateral pleural effusions on chest CT 07/11/2014. Status post thoracentesis 08/02/2014 and 08/22/2014. It does not appear fluid was sent for cytology. Status post thoracentesis 09/11/2014. Pathology showed reactive mesothelial cells. Status post placement of a left Pleurx catheter 09/27/2014. Cytology on pleural fluid showed no malignant cells. 16. Intermittent pain left flank region. Likely related to the Pleurx catheter.      Disposition:  [name] appears unchanged. He is tolerating the  nivolumab well. The left Pleurx catheter continues to drain a significant amount of fluid. I reviewed the most recent chest x-ray with a radiologist. He will be scheduled for a chest x-ray and interventional radiology evaluation today. We will plan for a restaging CT in April. We will schedule the CT sooner pending the  radiology evaluation today.   Betsy Coder, MD  01/09/2015  12:46 PM

## 2015-01-09 NOTE — Patient Instructions (Signed)
[facility] Discharge Instructions for Patients Receiving Chemotherapy  Today you received the following chemotherapy agents Nivoluamab.  To help prevent nausea and vomiting after your treatment, we encourage you to take your nausea medication as directed.    If you develop nausea and vomiting that is not controlled by your nausea medication, call the clinic.   BELOW ARE SYMPTOMS THAT SHOULD BE REPORTED IMMEDIATELY:  *FEVER GREATER THAN 100.5 F  *CHILLS WITH OR WITHOUT FEVER  NAUSEA AND VOMITING THAT IS NOT CONTROLLED WITH YOUR NAUSEA MEDICATION  *UNUSUAL SHORTNESS OF BREATH  *UNUSUAL BRUISING OR BLEEDING  TENDERNESS IN MOUTH AND THROAT WITH OR WITHOUT PRESENCE OF ULCERS  *URINARY PROBLEMS  *BOWEL PROBLEMS  UNUSUAL RASH Items with * indicate a potential emergency and should be followed up as soon as possible.  Feel free to call the clinic you have any questions or concerns. The clinic phone number is [phone].

## 2015-01-13 ENCOUNTER — Telehealth: Payer: Self-pay | Admitting: *Deleted

## 2015-01-13 NOTE — Telephone Encounter (Signed)
TODAY PLEURX CATHETER DRAINED 150CC WITH MORE BLOOD. THE FLUID WAS FOAMING. NO SHORTNESS OF BREATH. PT IS STILL HAVING THE SAME LEFT SIDED PAIN FOR THE PAST WEEK. ARE THERE ANY NEW UPDATES ON PT. SINCE HIS 01/09/15 VISIT WITH DR.SHERRILL?

## 2015-01-15 NOTE — Telephone Encounter (Signed)
Continue twice weeky drainage,plan for CT after next office visitl

## 2015-01-16 ENCOUNTER — Telehealth: Payer: Self-pay | Admitting: *Deleted

## 2015-01-16 NOTE — Telephone Encounter (Signed)
Heather from [facility] called to report that she drained 50 cc of clear fluid from the PleurX catheter.  There was less blood than usual.  He has no increase in dyspnea, his Os 2 sats were 97-98%, his BP was 108/72.  Please call if any change of orders is needed.  Cell number for Nira Conn is [phone].

## 2015-01-16 NOTE — Telephone Encounter (Signed)
Returned call to [employer], no new orders. Continue draining twice weekly. She voiced understanding.

## 2015-01-19 ENCOUNTER — Emergency Department (HOSPITAL_COMMUNITY): Payer: Medicare Other

## 2015-01-19 ENCOUNTER — Other Ambulatory Visit: Payer: Self-pay | Admitting: *Deleted

## 2015-01-19 ENCOUNTER — Encounter (HOSPITAL_COMMUNITY): Payer: Self-pay | Admitting: Emergency Medicine

## 2015-01-19 ENCOUNTER — Inpatient Hospital Stay (HOSPITAL_COMMUNITY)
Admission: EM | Admit: 2015-01-19 | Discharge: 2015-01-22 | DRG: 291 | Disposition: A | Discharge status: Home Health | Payer: Medicare Other | Attending: Internal Medicine | Admitting: Internal Medicine

## 2015-01-19 DIAGNOSIS — Z923 Personal history of irradiation: Secondary | ICD-10-CM

## 2015-01-19 DIAGNOSIS — Z8572 Personal history of non-Hodgkin lymphomas: Secondary | ICD-10-CM

## 2015-01-19 DIAGNOSIS — G8929 Other chronic pain: Secondary | ICD-10-CM | POA: Diagnosis present

## 2015-01-19 DIAGNOSIS — J90 Pleural effusion, not elsewhere classified: Secondary | ICD-10-CM | POA: Diagnosis present

## 2015-01-19 DIAGNOSIS — Z79891 Long term (current) use of opiate analgesic: Secondary | ICD-10-CM

## 2015-01-19 DIAGNOSIS — Z794 Long term (current) use of insulin: Secondary | ICD-10-CM | POA: Diagnosis not present

## 2015-01-19 DIAGNOSIS — I252 Old myocardial infarction: Secondary | ICD-10-CM | POA: Diagnosis not present

## 2015-01-19 DIAGNOSIS — I739 Peripheral vascular disease, unspecified: Secondary | ICD-10-CM | POA: Diagnosis present

## 2015-01-19 DIAGNOSIS — C3490 Malignant neoplasm of unspecified part of unspecified bronchus or lung: Secondary | ICD-10-CM | POA: Diagnosis not present

## 2015-01-19 DIAGNOSIS — D649 Anemia, unspecified: Secondary | ICD-10-CM | POA: Diagnosis present

## 2015-01-19 DIAGNOSIS — Z7982 Long term (current) use of aspirin: Secondary | ICD-10-CM | POA: Diagnosis not present

## 2015-01-19 DIAGNOSIS — E878 Other disorders of electrolyte and fluid balance, not elsewhere classified: Secondary | ICD-10-CM | POA: Diagnosis present

## 2015-01-19 DIAGNOSIS — Z8249 Family history of ischemic heart disease and other diseases of the circulatory system: Secondary | ICD-10-CM | POA: Diagnosis not present

## 2015-01-19 DIAGNOSIS — J9601 Acute respiratory failure with hypoxia: Secondary | ICD-10-CM | POA: Diagnosis not present

## 2015-01-19 DIAGNOSIS — R0600 Dyspnea, unspecified: Secondary | ICD-10-CM | POA: Diagnosis not present

## 2015-01-19 DIAGNOSIS — IMO0001 Reserved for inherently not codable concepts without codable children: Secondary | ICD-10-CM | POA: Diagnosis present

## 2015-01-19 DIAGNOSIS — I959 Hypotension, unspecified: Secondary | ICD-10-CM

## 2015-01-19 DIAGNOSIS — I2510 Atherosclerotic heart disease of native coronary artery without angina pectoris: Secondary | ICD-10-CM | POA: Diagnosis present

## 2015-01-19 DIAGNOSIS — I350 Nonrheumatic aortic (valve) stenosis: Secondary | ICD-10-CM | POA: Diagnosis present

## 2015-01-19 DIAGNOSIS — E1165 Type 2 diabetes mellitus with hyperglycemia: Secondary | ICD-10-CM | POA: Diagnosis present

## 2015-01-19 DIAGNOSIS — E871 Hypo-osmolality and hyponatremia: Secondary | ICD-10-CM | POA: Diagnosis not present

## 2015-01-19 DIAGNOSIS — I10 Essential (primary) hypertension: Secondary | ICD-10-CM | POA: Diagnosis present

## 2015-01-19 DIAGNOSIS — Z823 Family history of stroke: Secondary | ICD-10-CM | POA: Diagnosis not present

## 2015-01-19 DIAGNOSIS — Z79899 Other long term (current) drug therapy: Secondary | ICD-10-CM | POA: Diagnosis not present

## 2015-01-19 DIAGNOSIS — E785 Hyperlipidemia, unspecified: Secondary | ICD-10-CM | POA: Diagnosis present

## 2015-01-19 DIAGNOSIS — J189 Pneumonia, unspecified organism: Secondary | ICD-10-CM | POA: Diagnosis present

## 2015-01-19 DIAGNOSIS — R0602 Shortness of breath: Secondary | ICD-10-CM | POA: Diagnosis present

## 2015-01-19 DIAGNOSIS — I509 Heart failure, unspecified: Secondary | ICD-10-CM | POA: Diagnosis not present

## 2015-01-19 DIAGNOSIS — Z87891 Personal history of nicotine dependence: Secondary | ICD-10-CM | POA: Diagnosis not present

## 2015-01-19 DIAGNOSIS — J449 Chronic obstructive pulmonary disease, unspecified: Secondary | ICD-10-CM | POA: Diagnosis present

## 2015-01-19 DIAGNOSIS — I480 Paroxysmal atrial fibrillation: Secondary | ICD-10-CM | POA: Diagnosis present

## 2015-01-19 DIAGNOSIS — C3412 Malignant neoplasm of upper lobe, left bronchus or lung: Secondary | ICD-10-CM | POA: Diagnosis present

## 2015-01-19 DIAGNOSIS — Z9981 Dependence on supplemental oxygen: Secondary | ICD-10-CM

## 2015-01-19 DIAGNOSIS — C3491 Malignant neoplasm of unspecified part of right bronchus or lung: Secondary | ICD-10-CM | POA: Diagnosis not present

## 2015-01-19 DIAGNOSIS — R0902 Hypoxemia: Secondary | ICD-10-CM | POA: Diagnosis not present

## 2015-01-19 DIAGNOSIS — Z800 Family history of malignant neoplasm of digestive organs: Secondary | ICD-10-CM

## 2015-01-19 DIAGNOSIS — I5043 Acute on chronic combined systolic (congestive) and diastolic (congestive) heart failure: Secondary | ICD-10-CM | POA: Diagnosis not present

## 2015-01-19 DIAGNOSIS — R7989 Other specified abnormal findings of blood chemistry: Secondary | ICD-10-CM

## 2015-01-19 HISTORY — DX: Heart failure, unspecified: I50.9

## 2015-01-19 LAB — CBC
HEMATOCRIT: 29.7 % — AB (ref 39.0–52.0)
HEMOGLOBIN: 9.4 g/dL — AB (ref 13.0–17.0)
MCH: 29 pg (ref 26.0–34.0)
MCHC: 31.6 g/dL (ref 30.0–36.0)
MCV: 91.7 fL (ref 78.0–100.0)
Platelets: 294 10*3/uL (ref 150–400)
RBC: 3.24 MIL/uL — ABNORMAL LOW (ref 4.22–5.81)
RDW: 13.9 % (ref 11.5–15.5)
WBC: 14.4 10*3/uL — AB (ref 4.0–10.5)

## 2015-01-19 LAB — BASIC METABOLIC PANEL
ANION GAP: 11 (ref 5–15)
BUN: 14 mg/dL (ref 6–23)
CHLORIDE: 89 mmol/L — AB (ref 96–112)
CO2: 28 mmol/L (ref 19–32)
Calcium: 8 mg/dL — ABNORMAL LOW (ref 8.4–10.5)
Creatinine, Ser: 1 mg/dL (ref 0.50–1.35)
GFR calc Af Amer: 80 mL/min — ABNORMAL LOW (ref >90)
GFR calc non Af Amer: 69 mL/min — ABNORMAL LOW (ref >90)
GLUCOSE: 274 mg/dL — AB (ref 70–99)
Potassium: 4.2 mmol/L (ref 3.5–5.1)
Sodium: 128 mmol/L — ABNORMAL LOW (ref 135–145)

## 2015-01-19 LAB — BRAIN NATRIURETIC PEPTIDE: B Natriuretic Peptide: 641.3 pg/mL — ABNORMAL HIGH (ref 0.0–100.0)

## 2015-01-19 LAB — I-STAT TROPONIN, ED: TROPONIN I, POC: 0 ng/mL (ref 0.00–0.08)

## 2015-01-19 LAB — I-STAT CG4 LACTIC ACID, ED: LACTIC ACID, VENOUS: 1.51 mmol/L (ref 0.5–2.0)

## 2015-01-19 MED ORDER — ENOXAPARIN SODIUM 40 MG/0.4ML ~~LOC~~ SOLN
40.0000 mg | Freq: Every day | SUBCUTANEOUS | Status: DC
  Administered 2015-01-20 – 2015-01-21 (×3): 40 mg via SUBCUTANEOUS
  Filled 2015-01-19 (×3): qty 0.4

## 2015-01-19 MED ORDER — GABAPENTIN 300 MG PO CAPS
600.0000 mg | ORAL_CAPSULE | Freq: Two times a day (BID) | ORAL | Status: DC
  Administered 2015-01-20 – 2015-01-22 (×6): 600 mg via ORAL
  Filled 2015-01-19 (×6): qty 2

## 2015-01-19 MED ORDER — DOCUSATE SODIUM 100 MG PO CAPS
100.0000 mg | ORAL_CAPSULE | Freq: Two times a day (BID) | ORAL | Status: DC
  Administered 2015-01-20 – 2015-01-22 (×5): 100 mg via ORAL
  Filled 2015-01-19 (×5): qty 1

## 2015-01-19 MED ORDER — TRAMADOL HCL 50 MG PO TABS: 50.0000 mg | ORAL_TABLET | Freq: Three times a day (TID) | ORAL | Status: DC | PRN

## 2015-01-19 MED ORDER — ENOXAPARIN SODIUM 30 MG/0.3ML ~~LOC~~ SOLN: 30.0000 mg | SUBCUTANEOUS | Status: DC

## 2015-01-19 MED ORDER — ACETAMINOPHEN 650 MG RE SUPP: 650.0000 mg | Freq: Four times a day (QID) | RECTAL | Status: DC | PRN

## 2015-01-19 MED ORDER — PANTOPRAZOLE SODIUM 40 MG PO TBEC
40.0000 mg | DELAYED_RELEASE_TABLET | Freq: Every evening | ORAL | Status: DC
  Administered 2015-01-20 – 2015-01-21 (×2): 40 mg via ORAL
  Filled 2015-01-19 (×3): qty 1

## 2015-01-19 MED ORDER — FUROSEMIDE 10 MG/ML IJ SOLN
40.0000 mg | Freq: Two times a day (BID) | INTRAMUSCULAR | Status: DC
  Administered 2015-01-20 – 2015-01-21 (×3): 40 mg via INTRAVENOUS
  Filled 2015-01-19 (×2): qty 4

## 2015-01-19 MED ORDER — OXYCODONE-ACETAMINOPHEN 5-325 MG PO TABS
1.0000 | ORAL_TABLET | ORAL | Status: DC | PRN
  Administered 2015-01-20 – 2015-01-22 (×5): 1 via ORAL
  Filled 2015-01-19 (×5): qty 1

## 2015-01-19 MED ORDER — METOPROLOL TARTRATE 25 MG PO TABS
12.5000 mg | ORAL_TABLET | Freq: Every day | ORAL | Status: DC
  Filled 2015-01-19 (×2): qty 1

## 2015-01-19 MED ORDER — INSULIN GLARGINE 100 UNIT/ML ~~LOC~~ SOLN
10.0000 [IU] | Freq: Every day | SUBCUTANEOUS | Status: DC
  Administered 2015-01-20: 10 [IU] via SUBCUTANEOUS
  Filled 2015-01-19: qty 0.1

## 2015-01-19 MED ORDER — SUCRALFATE 1 G PO TABS
1.0000 g | ORAL_TABLET | Freq: Four times a day (QID) | ORAL | Status: DC
  Administered 2015-01-20 – 2015-01-22 (×9): 1 g via ORAL
  Filled 2015-01-19 (×12): qty 1

## 2015-01-19 MED ORDER — CITALOPRAM HYDROBROMIDE 20 MG PO TABS
20.0000 mg | ORAL_TABLET | Freq: Every day | ORAL | Status: DC
  Administered 2015-01-20 – 2015-01-22 (×3): 20 mg via ORAL
  Filled 2015-01-19 (×3): qty 1

## 2015-01-19 MED ORDER — FUROSEMIDE 10 MG/ML IJ SOLN
60.0000 mg | Freq: Once | INTRAMUSCULAR | Status: AC
  Administered 2015-01-19: 60 mg via INTRAVENOUS
  Filled 2015-01-19: qty 8

## 2015-01-19 MED ORDER — POTASSIUM CHLORIDE ER 10 MEQ PO TBCR
40.0000 meq | EXTENDED_RELEASE_TABLET | Freq: Every day | ORAL | Status: DC
  Administered 2015-01-20 – 2015-01-22 (×3): 40 meq via ORAL
  Filled 2015-01-19 (×4): qty 4

## 2015-01-19 MED ORDER — ACETAMINOPHEN 325 MG PO TABS: 650.0000 mg | ORAL_TABLET | Freq: Four times a day (QID) | ORAL | Status: DC | PRN

## 2015-01-19 MED ORDER — HYDROMORPHONE HCL 1 MG/ML IJ SOLN: 0.5000 mg | INTRAMUSCULAR | Status: AC | PRN

## 2015-01-19 MED ORDER — ASPIRIN 81 MG PO CHEW
81.0000 mg | CHEWABLE_TABLET | Freq: Two times a day (BID) | ORAL | Status: DC
  Administered 2015-01-20 – 2015-01-22 (×6): 81 mg via ORAL
  Filled 2015-01-19 (×6): qty 1

## 2015-01-19 MED ORDER — AMIODARONE HCL 200 MG PO TABS
200.0000 mg | ORAL_TABLET | Freq: Two times a day (BID) | ORAL | Status: DC
  Administered 2015-01-20 – 2015-01-22 (×6): 200 mg via ORAL
  Filled 2015-01-19 (×6): qty 1

## 2015-01-19 MED ORDER — INSULIN ASPART 100 UNIT/ML ~~LOC~~ SOLN
0.0000 [IU] | Freq: Three times a day (TID) | SUBCUTANEOUS | Status: DC
  Administered 2015-01-20: 3 [IU] via SUBCUTANEOUS
  Administered 2015-01-20: 5 [IU] via SUBCUTANEOUS
  Administered 2015-01-20: 7 [IU] via SUBCUTANEOUS
  Administered 2015-01-21: 2 [IU] via SUBCUTANEOUS
  Administered 2015-01-21: 9 [IU] via SUBCUTANEOUS
  Administered 2015-01-21 – 2015-01-22 (×2): 5 [IU] via SUBCUTANEOUS
  Administered 2015-01-22: 2 [IU] via SUBCUTANEOUS

## 2015-01-19 MED ORDER — HYDROMORPHONE HCL 1 MG/ML IJ SOLN
0.5000 mg | Freq: Once | INTRAMUSCULAR | Status: AC
  Administered 2015-01-19: 0.5 mg via INTRAVENOUS
  Filled 2015-01-19: qty 1

## 2015-01-19 MED ORDER — TAMSULOSIN HCL 0.4 MG PO CAPS
0.4000 mg | ORAL_CAPSULE | Freq: Every day | ORAL | Status: DC
  Administered 2015-01-20 – 2015-01-22 (×3): 0.4 mg via ORAL
  Filled 2015-01-19 (×3): qty 1

## 2015-01-19 MED ORDER — DEXTROSE 5 % IV SOLN
1.0000 g | Freq: Three times a day (TID) | INTRAVENOUS | Status: DC
  Administered 2015-01-20 – 2015-01-22 (×8): 1 g via INTRAVENOUS
  Filled 2015-01-19 (×9): qty 1

## 2015-01-19 MED ORDER — FINASTERIDE 5 MG PO TABS
5.0000 mg | ORAL_TABLET | Freq: Every day | ORAL | Status: DC
  Administered 2015-01-20 – 2015-01-22 (×3): 5 mg via ORAL
  Filled 2015-01-19 (×3): qty 1

## 2015-01-19 MED ORDER — SODIUM CHLORIDE 0.9 % IJ SOLN
3.0000 mL | Freq: Two times a day (BID) | INTRAMUSCULAR | Status: DC
  Administered 2015-01-20: 3 mL via INTRAVENOUS

## 2015-01-19 NOTE — ED Notes (Signed)
Pt in xray

## 2015-01-19 NOTE — H&P (Signed)
[facility] History and Physical  BANE HAGY YKD:[id number] DOB: [birth year] DOA: 01/19/2015  PCP: [name], MD  Specialists: Dr. Betsy Coder  Chief Complaint: Shortness of breath  HPI: [name] [name] is a 81 y.o. gentleman accompanied by his daughter and granddaughter with a history of NSCLC (left upper lobe mass, currently undergoing treatment with Nivolumab), combined systolic and diastolic heart failure (last echo September 2015, EF 25-30% at that time), and persistent pleural effusion on left S/P Pleurx catheter placement in December 2015 (drained twice weekly by home health nurse).  [name] still lives independently, though family is near, and has had progressive shortness of breath, at rest and with exertion, for the past 1-2 weeks.  He increased his home oxygen from 2L to 4L with little improvement in his symptoms.  He denies any notable swelling or weight gain.  No orthopnea or PND.  No increase in his abdominal girth.  No syncope.  No wheezing.  He has a cough productive of clear phlegm.  No substernal chest pain but indicates chronic pain in left chest that he attributes to his "lung pain".  The patient was found to be hypoxic upon presentation to the ED, and chest xray shows chronic changes on the left with concern for increased vascular engorgement on the right.  BNP elevated to 641.  Lasix 60mg  IV one time ordered in the ED.  The patient is being admitted for further evaluation and treatment.   Review of Systems: Decreased urine stream for the past 3-4 months.  No hematuria.  No change in bowel habits.  No recent medication changes.  No mouth pain, dysphagia, or odynophagia.  Otherwise, 10 systems reviewed and negative except as stated in HPI.  Past Medical History  Diagnosis Date  . Peripheral arterial disease     s/p Ao Bifem Bypass  . Diabetes mellitus without complication   . CAD (coronary artery disease)     By  CT Scan  . Hyperlipidemia   . Hypertension   .  Peripheral neuropathy   . COPD (chronic obstructive pulmonary disease)   . History of lower GI bleeding   . Substance abuse   . AAA (abdominal aortic aneurysm)   . MI (myocardial infarction) 04/10/14  . Non Hodgkin's lymphoma 2000, 2010  . Lung cancer, upper lobe     left lung with left hilar lymphadenopathy; S/P radiation; S/P Taxol and carboplatin therapy; currently receiving Nivolumab  . CHF (congestive heart failure)     Combined systolic and diastolic, EF 39-76% with grade II diastolic dysfunction   Past Surgical History  Procedure Laterality Date  . Fracture surgery  2009    Left ankle  . Pr vein bypass graft,aorto-fem-pop  2005    Right common femoral to BK popliteal BPG  . Pr vein bypass graft,aorto-fem-pop  01-27-05    Revision of Right fem-pop BPG  . Cardiac catheterization  1990's  . Video bronchoscopy Bilateral 03/14/2014    Procedure: VIDEO BRONCHOSCOPY WITH FLUORO;  Surgeon: Rigoberto Noel, MD;  Location: [place];  Service: Cardiopulmonary;  Laterality: Bilateral;  . Nm myoview ltd  11/2013    Lexiscan.  EF 60%, small fixed inferoapical defect - ? artifact  . Transthoracic echocardiogram  11/2013    EF 60-65%, Gr 1 DD, Mod AS, Sever MAC - no MS.     Marland Kitchen Left heart catheterization with coronary angiogram N/A 04/10/2014    Procedure: LEFT HEART CATHETERIZATION WITH CORONARY ANGIOGRAM;  Surgeon: Leonie Man, MD;  Location: [facility]ATH [facility];  Service: Cardiovascular;  Laterality: N/A;  . Port in right chest Right   . Pleurx catheter on left Left    Social History:  History   Social History Narrative  Former tobacco use.  No EtOH or illicit drug use.  Lives independently.  Does not ambulate with walker.  He has two children, 6 grandchildren, several great- and great-great grandchildren as well.  No Known Allergies  Family History  Problem Relation Age of Onset  . Cancer Mother     Colon  . Heart attack Mother   . Stroke Father   . Hyperlipidemia Father   .  Hyperlipidemia Sister   . Hyperlipidemia Brother   . Hyperlipidemia Daughter   . Hypertension Son    Prior to Admission medications   Medication Sig Start Date End Date Taking? Authorizing Provider  acetaminophen (TYLENOL ARTHRITIS PAIN) 650 MG CR tablet Take 650 mg by mouth 2 (two) times daily as needed for pain (pain).    Yes Historical Provider, MD  amiodarone (PACERONE) 200 MG tablet Take 200 mg by mouth 2 (two) times daily.   Yes Historical Provider, MD  aspirin 81 MG tablet Take 81 mg by mouth 2 (two) times daily.    Yes Historical Provider, MD  citalopram (CELEXA) 20 MG tablet Take 20 mg by mouth daily.   Yes Historical Provider, MD  docusate sodium 100 MG CAPS Take 100 mg by mouth 2 (two) times daily. 09/12/14  Yes Erline Hau, MD  feeding supplement, GLUCERNA SHAKE, (GLUCERNA SHAKE) LIQD Take 237 mLs by mouth daily. 08/23/14  Yes Barton Dubois, MD  finasteride (PROSCAR) 5 MG tablet Take 5 mg by mouth daily.   Yes Historical Provider, MD  furosemide (LASIX) 40 MG tablet Take 40 mg by mouth daily.   Yes Historical Provider, MD  gabapentin (NEURONTIN) 600 MG tablet Take 600 mg by mouth 2 (two) times daily.    Yes Historical Provider, MD  Glucosamine-Chondroit-Vit C-Mn (GLUCOSAMINE CHONDR 1500 COMPLX) CAPS Take 1 capsule by mouth daily.   Yes Historical Provider, MD  insulin aspart (NOVOLOG) 100 UNIT/ML injection Inject 5 Units into the skin 2 (two) times daily.   Yes Historical Provider, MD  insulin glargine (LANTUS) 100 UNIT/ML injection Inject 0.18 mLs (18 Units total) into the skin at bedtime. Patient taking differently: Inject 10 Units into the skin at bedtime.  08/23/14  Yes Barton Dubois, MD  linagliptin (TRADJENTA) 5 MG TABS tablet Take 1 tablet (5 mg total) by mouth daily. With largest meal 07/12/14  Yes Tiffany L Reed, DO  metoprolol tartrate (LOPRESSOR) 25 MG tablet Take 12.5 mg by mouth daily. 06/02/14  Yes Mihai Croitoru, MD  oxyCODONE-acetaminophen  (PERCOCET/ROXICET) 5-325 MG per tablet Take one tablet by mouth every 4 hours as needed for pain 06/30/14  Yes Tiffany L Reed, DO  pantoprazole (PROTONIX) 40 MG tablet Take 40 mg by mouth every evening.   Yes Historical Provider, MD  potassium chloride 20 MEQ TBCR Take 40 mEq by mouth daily. 07/12/14  Yes Tiffany L Reed, DO  sucralfate (CARAFATE) 1 G tablet Take 1 tablet (1 g total) by mouth 4 (four) times daily. 05/14/14  Yes Linton Flemings, MD  Tamsulosin HCl (FLOMAX) 0.4 MG CAPS Take 0.4 mg by mouth daily.   Yes Historical Provider, MD  traMADol (ULTRAM) 50 MG tablet Take 1 tablet (50 mg total) by mouth every 12 (twelve) hours as needed. 01/09/15  Yes Ladell Pier, MD   Physical  Exam: Filed Vitals:   01/19/15 1934 01/19/15 1937 01/19/15 2058 01/19/15 2307  BP: 91/53  93/48 90/46  Pulse: 99  88 92  Temp: 98.8 F (37.1 C)     TempSrc: Oral     Resp: 23  23 18   SpO2: 98% 97% 99% 95%    General:  Awake and alert.  Oriented to person, place, time and situation.  NAD. Eyes: PERRL bilaterally, conjunctiva are pink.  EOMI. ENT: Mucous membranes are dry.  No nasal drainage. Neck: Supple.  No carotid bruit.  Cardiovascular: NR/RR.  Systolic murmur heard best at LUSB.  No pitting edema. Respiratory: Diminished bilaterally, no wheeze. Abdomen: Soft/NT/ND.  Bowel sounds are present.  No guarding. Skin: Pale, dry. Musculoskeletal: Moves all four extremities spontaneously. Psychiatric: Normal affect. Neurologic: No focal deficits.  Labs on Admission:  Basic Metabolic Panel:  Recent Labs Lab 01/19/15 2017  NA 128*  K 4.2  CL 89*  CO2 28  GLUCOSE 274*  BUN 14  CREATININE 1.00  CALCIUM 8.0*   CBC:  Recent Labs Lab 01/19/15 2017  WBC 14.4*  HGB 9.4*  HCT 29.7*  MCV 91.7  PLT 294   BNP (last 3 results)  Recent Labs  01/19/15 2017  BNP 641.3*   Radiological Exams on Admission: Dg Chest 2 View (if Patient Has Fever And/or Copd)  01/19/2015   CLINICAL DATA:  History of lung  carcinoma chronic left-sided chest pain  EXAM: CHEST  2 VIEW  COMPARISON:  01/09/2015  FINDINGS: Persistent opacification in the upper left chest is noted stable from the prior exam. A PleurX catheter is again noted on the left a pleural effusion is noted on the left slightly greater than that seen on the prior exam. A right chest wall port is noted. Some slight increased density is noted in the mid right lung which may represent some vascular congestion. Focal early infiltrate cannot be totally excluded.  IMPRESSION: Slight increase in the degree of left-sided pleural effusion.  Chronic changes on the left.  Slight increased vascularity on the right which may be related some vascular congestion.   Electronically Signed   By: Inez Catalina M.D.   On: 01/19/2015 20:21   EKG: Independently reviewed. NSR.  LBBB previously documented.  No acute ST segment changes.  Assessment/Plan Principal Problem:   Systolic and diastolic CHF, acute on chronic Active Problems:   Squamous cell carcinoma lung   Diabetes mellitus type 2, uncontrolled, without complications   Pleural effusion on left   Hypoxia   Hyponatremia with excess extracellular fluid volume   Anemia   Lung cancer   1. Admit to telemetry  2. Acute on chronic combined CHF --Cautious diuresis with IV lasix --Repeat echo --Daily weights; strict I/O --Follow BNP trend --Serial troponin x 3 --No ACE-I due to relative hypotension (subacute) --Hold metoprolol for systolic BP less than 562  3.  Hypoxia, acute on chronic --O2 sat stable with 4L [place]; hopefully he will wean back down to baseline requirement of 2L  4.  NSCLC with chronic effusion --Will go ahead a repeat CTA chest PE protocol, last done in November 2015, given history of lung cancer, chronic effusion, and acute on chronic hypoxia (patient says that he was due for outpatient CT on Monday anyway) --Will need to notify Dr. Benay Spice of admission in the AM --Empiric antibiotics since  post-obstructive pneumonia cannot be ruled out  5.  Hyponatremia, etiology unclear at this point, would suspect SIADH given history of lung cancer. --  Urine electrolytes orders. --Urine and serum osmolality ordered --One liter fluid restriction for now  6. History of DM --Continue home dose of levemir.  Glucose testing AC/HS with sliding scale insulin.  7.  Anemia --Suspect related to chronic disease (malignancy).  Continue to monitor.  No transfusion requirement at this time.   Code Status: FULL Family Communication: patient, daughter, granddaughter Disposition Plan: 3-4 days  Time spent: 47 minutes  Eber Jones [facility]  01/19/2015, 11:36 PM

## 2015-01-19 NOTE — ED Notes (Signed)
Nurse currently accessing port

## 2015-01-19 NOTE — ED Notes (Signed)
EKG given to EDP,Lockwood,MD., for review.

## 2015-01-19 NOTE — ED Notes (Signed)
Bed: WQ37 Expected date: 01/19/15 Expected time: 7:19 PM Means of arrival: Ambulance Comments: Shortness of breath, Lung cancer

## 2015-01-19 NOTE — ED Provider Notes (Signed)
CSN: [id number]     Arrival date & time 01/19/15  1929 History   First MD Initiated Contact with Patient 01/19/15 1951     Chief Complaint  Patient presents with  . Shortness of Breath    HPI  Patient presents with concern of ongoing left-sided chest pain, dyspnea. This episode has been present for about one week, worsening over the past few days. Patient has a notable history of lung cancer, with left-sided pleural effusion, with Pleurx catheter in place. Last drainage was 2 days ago, with minimal production. Over the past days, patient has had increasing dyspnea, in spite of using home oxygen, in increasing amounts.  Symptoms are worse with activity. No new fever, cough. Patient also has persistent pain about the left lower chest wall, inferior to his Pleurx catheter. No abdominal pain, nausea, vomiting. Last chemotherapy was 2 weeks ago. Pain is minimally improved with tramadol, really improved with oxycodone.   Past Medical History  Diagnosis Date  . Peripheral arterial disease     s/p Ao Bifem Bypass  . Diabetes mellitus without complication   . CAD (coronary artery disease)     By  CT Scan  . Hyperlipidemia   . Hypertension   . Peripheral neuropathy   . COPD (chronic obstructive pulmonary disease)   . History of lower GI bleeding   . Substance abuse   . AAA (abdominal aortic aneurysm)   . MI (myocardial infarction) 04/10/14  . Non Hodgkin's lymphoma 2000, 2010  . Lung cancer, upper lobe     left lung   Past Surgical History  Procedure Laterality Date  . Fracture surgery  2009    Left ankle  . Pr vein bypass graft,aorto-fem-pop  2005    Right common femoral to BK popliteal BPG  . Pr vein bypass graft,aorto-fem-pop  01-27-05    Revision of Right fem-pop BPG  . Cardiac catheterization  1990's  . Video bronchoscopy Bilateral 03/14/2014    Procedure: VIDEO BRONCHOSCOPY WITH FLUORO;  Surgeon: Rigoberto Noel, MD;  Location: [facility];  Service: Cardiopulmonary;   Laterality: Bilateral;  . Nm myoview ltd  11/2013    Lexiscan.  EF 60%, small fixed inferoapical defect - ? artifact  . Transthoracic echocardiogram  11/2013    EF 60-65%, Gr 1 DD, Mod AS, Sever MAC - no MS.     Marland Kitchen Left heart catheterization with coronary angiogram N/A 04/10/2014    Procedure: LEFT HEART CATHETERIZATION WITH CORONARY ANGIOGRAM;  Surgeon: Leonie Man, MD;  Location: [facility];  Service: Cardiovascular;  Laterality: N/A;   Family History  Problem Relation Age of Onset  . Cancer Mother     Colon  . Heart attack Mother   . Stroke Father   . Hyperlipidemia Father   . Hyperlipidemia Sister   . Hyperlipidemia Brother   . Hyperlipidemia Daughter   . Hypertension Son    History  Substance Use Topics  . Smoking status: Former Smoker -- 1.00 packs/day for 50 years    Types: Cigarettes    Quit date: 10/29/2003  . Smokeless tobacco: Never Used  . Alcohol Use: No    Review of Systems  Constitutional:       Per HPI, otherwise negative  HENT:       Per HPI, otherwise negative  Respiratory:       Per HPI, otherwise negative  Cardiovascular:       Per HPI, otherwise negative  Gastrointestinal: Negative for nausea, vomiting and abdominal pain.  Endocrine:       Negative aside from HPI  Genitourinary:       Neg aside from HPI   Musculoskeletal:       Per HPI, otherwise negative  Skin: Negative.   Allergic/Immunologic: Positive for immunocompromised state.  Neurological: Negative for syncope.  Hematological: Bruises/bleeds easily.      Allergies  Review of patient's allergies indicates no known allergies.  Home Medications   Prior to Admission medications   Medication Sig Start Date End Date Taking? Authorizing Provider  amiodarone (PACERONE) 200 MG tablet Take 200 mg by mouth 2 (two) times daily.   Yes Historical Provider, MD  acetaminophen (TYLENOL ARTHRITIS PAIN) 650 MG CR tablet Take 650 mg by mouth 2 (two) times daily as needed for pain.    Historical  Provider, MD  aspirin 81 MG tablet Take 81 mg by mouth 2 (two) times daily.     Historical Provider, MD  Besifloxacin HCl 0.6 % SUSP Apply to eye every 2 (two) hours.    Historical Provider, MD  Bromfenac Sodium 0.07 % SOLN Apply to eye at bedtime.    Historical Provider, MD  citalopram (CELEXA) 20 MG tablet Take 20 mg by mouth daily.    Historical Provider, MD  Difluprednate 0.05 % EMUL Apply to eye every 2 (two) hours.    Historical Provider, MD  docusate sodium 100 MG CAPS Take 100 mg by mouth 2 (two) times daily. 09/12/14   Erline Hau, MD  feeding supplement, GLUCERNA SHAKE, (GLUCERNA SHAKE) LIQD Take 237 mLs by mouth daily. 08/23/14   Barton Dubois, MD  finasteride (PROSCAR) 5 MG tablet Take 5 mg by mouth daily.    Historical Provider, MD  furosemide (LASIX) 40 MG tablet Take 40 mg by mouth daily.    Historical Provider, MD  gabapentin (NEURONTIN) 600 MG tablet Take 600 mg by mouth 2 (two) times daily.     Historical Provider, MD  Glucosamine-Chondroit-Vit C-Mn (GLUCOSAMINE CHONDR 1500 COMPLX) CAPS Take 1 capsule by mouth daily.    Historical Provider, MD  insulin aspart (NOVOLOG) 100 UNIT/ML injection Inject 5 Units into the skin 2 (two) times daily.    Historical Provider, MD  insulin glargine (LANTUS) 100 UNIT/ML injection Inject 0.18 mLs (18 Units total) into the skin at bedtime. Patient taking differently: Inject 10 Units into the skin at bedtime.  08/23/14   Barton Dubois, MD  linagliptin (TRADJENTA) 5 MG TABS tablet Take 1 tablet (5 mg total) by mouth daily. With largest meal 07/12/14   Tiffany L Reed, DO  metoprolol tartrate (LOPRESSOR) 25 MG tablet Take 12.5 mg by mouth daily. 06/02/14   Mihai Croitoru, MD  oxyCODONE-acetaminophen (PERCOCET/ROXICET) 5-325 MG per tablet Take one tablet by mouth every 4 hours as needed for pain 06/30/14   Tiffany L Reed, DO  pantoprazole (PROTONIX) 40 MG tablet Take 40 mg by mouth every evening.    Historical Provider, MD  potassium chloride  20 MEQ TBCR Take 40 mEq by mouth daily. 07/12/14   Tiffany L Reed, DO  sucralfate (CARAFATE) 1 G tablet Take 1 tablet (1 g total) by mouth 4 (four) times daily. 05/14/14   Linton Flemings, MD  Tamsulosin HCl (FLOMAX) 0.4 MG CAPS Take 0.4 mg by mouth daily.    Historical Provider, MD  traMADol (ULTRAM) 50 MG tablet Take 1 tablet (50 mg total) by mouth every 12 (twelve) hours as needed. 01/09/15   Ladell Pier, MD   BP 91/53 mmHg  Pulse  99  Temp(Src) 98.8 F (37.1 C) (Oral)  Resp 23  SpO2 97% Physical Exam  Constitutional: He is oriented to person, place, and time. He appears well-developed. No distress.  HENT:  Head: Normocephalic and atraumatic.  Eyes: Conjunctivae and EOM are normal.  Cardiovascular: Normal rate and regular rhythm.   Pulmonary/Chest: No stridor. He has decreased breath sounds.    Abdominal: He exhibits no distension. There is no tenderness.  Musculoskeletal: He exhibits no edema.  Neurological: He is alert and oriented to person, place, and time.  Skin: Skin is warm and dry.  Psychiatric: He has a normal mood and affect.  Nursing note and vitals reviewed.   ED Course  Procedures (including critical care time) Labs Review Labs Reviewed  BASIC METABOLIC PANEL  CBC  BRAIN NATRIURETIC PEPTIDE  I-STAT [place], ED  I-STAT CG4 LACTIC ACID, ED    Imaging Review Dg Chest 2 View (if Patient Has Fever And/or Copd)  01/19/2015   CLINICAL DATA:  History of lung carcinoma chronic left-sided chest pain  EXAM: CHEST  2 VIEW  COMPARISON:  01/09/2015  FINDINGS: Persistent opacification in the upper left chest is noted stable from the prior exam. A PleurX catheter is again noted on the left a pleural effusion is noted on the left slightly greater than that seen on the prior exam. A right chest wall port is noted. Some slight increased density is noted in the mid right lung which may represent some vascular congestion. Focal early infiltrate cannot be totally excluded.   IMPRESSION: Slight increase in the degree of left-sided pleural effusion.  Chronic changes on the left.  Slight increased vascularity on the right which may be related some vascular congestion.   Electronically Signed   By: Inez Catalina M.D.   On: 01/19/2015 20:21     EKG Interpretation   Date/Time:  Thursday January 19 2015 19:39:30 EDT Ventricular Rate:  97 PR Interval:  167 QRS Duration: 168 QT Interval:  410 QTC Calculation: 521 R Axis:   -47 Text Interpretation:  Sinus rhythm Left bundle branch block Baseline  wander in lead(s) I II aVR Sinus rhythm Left bundle branch block Artifact  Abnormal ekg Confirmed by Carmin Muskrat  MD [phone]) on 01/19/2015 7:42:44  PM     After the initial evaluation I reviewed the patient's chart, including history of pleural effusion requiring frequent drainage.  Exam the patient is awake and alert. Oxygen saturation is 98% with 4 L nasal cannula, this is abnormal, increased from his home oxygen use.  On repeat exam the patient is awake and alert.  We reviewed all findings, including elevated BNP, x-ray evidence of increased pleural effusion. Patient received 1.5 times his home Lasix dose.   MDM   Final diagnoses:  Hypoxia  Pleural effusion  Elevated brain natriuretic peptide (BNP) level    Patient presents with dyspnea. Patient has increased oxygen requirement here from his home 24/7 nasal cannula 2 L requirement  Patient also has evidence for increased left-sided pleural effusion, likely malignant effusion. No evidence for concurrent infection, the patient has mild hypertension, this has been present for some time. Patient received Lasix, supplemental oxygen, was admitted for further evaluation and management.   Carmin Muskrat, MD 01/19/15 2224

## 2015-01-19 NOTE — Progress Notes (Addendum)
Per Dr. Benay Spice: No labs needed for Nivolumab on 01/23/15. Lab appointment canceled.

## 2015-01-19 NOTE — ED Notes (Signed)
Per EMS: Pt has hx of lung cancer. Chronic L side pain. Over the past week, SOB and pain has worsened. Pt usually on 3 L [place], but bumped it up to 5 L; oxygen 88-94% on 5 L [place]. A&O x 4. Lives at home by himself.

## 2015-01-19 NOTE — ED Notes (Signed)
Notified RN,Laura,C pt. Blood pressure low 91/53.

## 2015-01-20 ENCOUNTER — Inpatient Hospital Stay (HOSPITAL_COMMUNITY): Payer: Medicare Other

## 2015-01-20 ENCOUNTER — Encounter (HOSPITAL_COMMUNITY): Payer: Self-pay

## 2015-01-20 DIAGNOSIS — J90 Pleural effusion, not elsewhere classified: Secondary | ICD-10-CM

## 2015-01-20 DIAGNOSIS — G629 Polyneuropathy, unspecified: Secondary | ICD-10-CM

## 2015-01-20 DIAGNOSIS — D638 Anemia in other chronic diseases classified elsewhere: Secondary | ICD-10-CM

## 2015-01-20 DIAGNOSIS — J449 Chronic obstructive pulmonary disease, unspecified: Secondary | ICD-10-CM

## 2015-01-20 DIAGNOSIS — I509 Heart failure, unspecified: Secondary | ICD-10-CM

## 2015-01-20 DIAGNOSIS — I4891 Unspecified atrial fibrillation: Secondary | ICD-10-CM

## 2015-01-20 DIAGNOSIS — C3491 Malignant neoplasm of unspecified part of right bronchus or lung: Secondary | ICD-10-CM

## 2015-01-20 DIAGNOSIS — E871 Hypo-osmolality and hyponatremia: Secondary | ICD-10-CM

## 2015-01-20 DIAGNOSIS — C3412 Malignant neoplasm of upper lobe, left bronchus or lung: Secondary | ICD-10-CM

## 2015-01-20 DIAGNOSIS — N189 Chronic kidney disease, unspecified: Secondary | ICD-10-CM

## 2015-01-20 DIAGNOSIS — E119 Type 2 diabetes mellitus without complications: Secondary | ICD-10-CM

## 2015-01-20 DIAGNOSIS — R0600 Dyspnea, unspecified: Secondary | ICD-10-CM

## 2015-01-20 DIAGNOSIS — J9601 Acute respiratory failure with hypoxia: Secondary | ICD-10-CM | POA: Insufficient documentation

## 2015-01-20 DIAGNOSIS — D631 Anemia in chronic kidney disease: Secondary | ICD-10-CM

## 2015-01-20 LAB — OSMOLALITY: Osmolality: 282 mOsm/kg (ref 275–300)

## 2015-01-20 LAB — CBC
HEMATOCRIT: 28.2 % — AB (ref 39.0–52.0)
Hemoglobin: 9.1 g/dL — ABNORMAL LOW (ref 13.0–17.0)
MCH: 29.5 pg (ref 26.0–34.0)
MCHC: 32.3 g/dL (ref 30.0–36.0)
MCV: 91.6 fL (ref 78.0–100.0)
Platelets: 291 10*3/uL (ref 150–400)
RBC: 3.08 MIL/uL — AB (ref 4.22–5.81)
RDW: 14.2 % (ref 11.5–15.5)
WBC: 14.3 10*3/uL — AB (ref 4.0–10.5)

## 2015-01-20 LAB — TROPONIN I
TROPONIN I: 0.05 ng/mL — AB (ref <0.031)
TROPONIN I: 0.1 ng/mL — AB (ref <0.031)
Troponin I: 0.09 ng/mL — ABNORMAL HIGH (ref <0.031)

## 2015-01-20 LAB — COMPREHENSIVE METABOLIC PANEL
ALBUMIN: 2.3 g/dL — AB (ref 3.5–5.2)
ALK PHOS: 72 U/L (ref 39–117)
ALT: 9 U/L (ref 0–53)
AST: 14 U/L (ref 0–37)
Anion gap: 12 (ref 5–15)
BILIRUBIN TOTAL: 0.9 mg/dL (ref 0.3–1.2)
BUN: 16 mg/dL (ref 6–23)
CALCIUM: 8.1 mg/dL — AB (ref 8.4–10.5)
CO2: 29 mmol/L (ref 19–32)
CREATININE: 1.06 mg/dL (ref 0.50–1.35)
Chloride: 88 mmol/L — ABNORMAL LOW (ref 96–112)
GFR, EST AFRICAN AMERICAN: 75 mL/min — AB (ref >90)
GFR, EST NON AFRICAN AMERICAN: 65 mL/min — AB (ref >90)
Glucose, Bld: 359 mg/dL — ABNORMAL HIGH (ref 70–99)
Potassium: 3.6 mmol/L (ref 3.5–5.1)
Sodium: 129 mmol/L — ABNORMAL LOW (ref 135–145)
TOTAL PROTEIN: 6.4 g/dL (ref 6.0–8.3)

## 2015-01-20 LAB — BRAIN NATRIURETIC PEPTIDE: B Natriuretic Peptide: 828.5 pg/mL — ABNORMAL HIGH (ref 0.0–100.0)

## 2015-01-20 LAB — MRSA PCR SCREENING: MRSA BY PCR: NEGATIVE

## 2015-01-20 LAB — GLUCOSE, CAPILLARY
GLUCOSE-CAPILLARY: 219 mg/dL — AB (ref 70–99)
GLUCOSE-CAPILLARY: 303 mg/dL — AB (ref 70–99)
Glucose-Capillary: 268 mg/dL — ABNORMAL HIGH (ref 70–99)
Glucose-Capillary: 315 mg/dL — ABNORMAL HIGH (ref 70–99)
Glucose-Capillary: 265 mg/dL — ABNORMAL HIGH (ref 70–99)

## 2015-01-20 MED ORDER — SODIUM CHLORIDE 0.9 % IJ SOLN
10.0000 mL | INTRAMUSCULAR | Status: DC | PRN
  Administered 2015-01-20 – 2015-01-22 (×4): 10 mL
  Filled 2015-01-20 (×4): qty 40

## 2015-01-20 MED ORDER — LINAGLIPTIN 5 MG PO TABS
5.0000 mg | ORAL_TABLET | Freq: Every day | ORAL | Status: DC
  Administered 2015-01-20 – 2015-01-21 (×2): 5 mg via ORAL
  Filled 2015-01-20 (×3): qty 1

## 2015-01-20 MED ORDER — INSULIN ASPART 100 UNIT/ML ~~LOC~~ SOLN
3.0000 [IU] | Freq: Three times a day (TID) | SUBCUTANEOUS | Status: DC
  Administered 2015-01-20: 3 [IU] via SUBCUTANEOUS

## 2015-01-20 MED ORDER — IOHEXOL 350 MG/ML SOLN
100.0000 mL | Freq: Once | INTRAVENOUS | Status: AC | PRN
  Administered 2015-01-20: 100 mL via INTRAVENOUS

## 2015-01-20 MED ORDER — INSULIN GLARGINE 100 UNIT/ML ~~LOC~~ SOLN
18.0000 [IU] | Freq: Every day | SUBCUTANEOUS | Status: DC
  Administered 2015-01-20: 18 [IU] via SUBCUTANEOUS
  Filled 2015-01-20: qty 0.18

## 2015-01-20 MED ORDER — INSULIN GLARGINE 100 UNIT/ML ~~LOC~~ SOLN
15.0000 [IU] | Freq: Every day | SUBCUTANEOUS | Status: DC
  Filled 2015-01-20: qty 0.15

## 2015-01-20 MED ORDER — PERFLUTREN LIPID MICROSPHERE
1.0000 mL | INTRAVENOUS | Status: AC | PRN
  Filled 2015-01-20: qty 10

## 2015-01-20 MED ORDER — FUROSEMIDE 40 MG PO TABS
40.0000 mg | ORAL_TABLET | Freq: Every day | ORAL | Status: DC
  Administered 2015-01-20: 40 mg via ORAL
  Filled 2015-01-20: qty 1

## 2015-01-20 MED ORDER — SODIUM CHLORIDE 0.9 % IJ SOLN: 10.0000 mL | Freq: Two times a day (BID) | INTRAMUSCULAR | Status: DC

## 2015-01-20 NOTE — Progress Notes (Signed)
IP PROGRESS NOTE  Subjective:   [name] is well-known to me with a history of non-small cell lung cancer. He is currently being treated with nivolumab, last given 01/09/2015. He presented yesterday with increased dyspnea. He feels better today. He denies fever. Mild wheezing. The left Pleurx catheter drained only 50 mL earlier this week. He reports diarrhea today.  Objective: Vital signs in last 24 hours: Blood pressure 82/52, pulse 94, temperature 99.1 F (37.3 C), temperature source Oral, resp. rate 17, height 5\' 11"  (1.803 m), weight 191 lb 5.8 oz (86.8 kg), SpO2 99 %.  Intake/Output from previous day:    Physical Exam:  Lungs: Decreased breath sounds at the left upper chest, rhonchi at the left base, no respiratory distress Cardiac: Regular rate and rhythm, 2/6 systolic murmur Abdomen: Soft and nontender, no hepatosplenomegaly Extremities: No leg edema Musculoskeletal: Left Pleurx catheter site with a gauze dressing, mild erythema at the posterior aspect of the dressing  Portacath/PICC-without erythema  Lab Results:  Recent Labs  01/19/15 2017 01/20/15 0220  WBC 14.4* 14.3*  HGB 9.4* 9.1*  HCT 29.7* 28.2*  PLT 294 291    BMET  Recent Labs  01/19/15 2017 01/20/15 0220  NA 128* 129*  K 4.2 3.6  CL 89* 88*  CO2 28 29  GLUCOSE 274* 359*  BUN 14 16  CREATININE 1.00 1.06  CALCIUM 8.0* 8.1*    Studies/Results: Dg Chest 2 View (if Patient Has Fever And/or Copd)  01/19/2015   CLINICAL DATA:  History of lung carcinoma chronic left-sided chest pain  EXAM: CHEST  2 VIEW  COMPARISON:  01/09/2015  FINDINGS: Persistent opacification in the upper left chest is noted stable from the prior exam. A PleurX catheter is again noted on the left a pleural effusion is noted on the left slightly greater than that seen on the prior exam. A right chest wall port is noted. Some slight increased density is noted in the mid right lung which may represent some vascular congestion. Focal  early infiltrate cannot be totally excluded.  IMPRESSION: Slight increase in the degree of left-sided pleural effusion.  Chronic changes on the left.  Slight increased vascularity on the right which may be related some vascular congestion.   Electronically Signed   By: Inez Catalina M.D.   On: 01/19/2015 20:21   Ct Angio Chest Pe W/cm &/or Wo Cm  01/20/2015   CLINICAL DATA:  81 year old male with hypoxia and hypotension. Left lung cancer post radiation. Ongoing chemotherapy.  EXAM: CT ANGIOGRAPHY CHEST WITH CONTRAST  TECHNIQUE: Multidetector CT imaging of the chest was performed using the standard protocol during bolus administration of intravenous contrast. Multiplanar CT image reconstructions and MIPs were obtained to evaluate the vascular anatomy.  CONTRAST:  129mL OMNIPAQUE IOHEXOL 350 MG/ML SOLN  COMPARISON:  Chest radiograph 1 day prior. Most recent chest CT 09/10/2014  FINDINGS: There are no filling defects within the pulmonary arteries to suggest pulmonary embolus.  Left pleural drain, with tip at the lung apex. There is small left basilar pleural effusion with small adjacent foci of air. Left upper lobe paramediastinal mass like opacity measures 13.5 x 4.5 cm, increased from prior where it measured 4 x 10 cm. There is increasing soft tissue density at the left lung apex with scattered air bronchograms. Increasing soft tissue density in the left perihilar region. Minimal atelectasis adjacent to the left basilar pleural effusion.  The heart size is mildly enlarged. Coronary artery calcifications are seen. Small volume of pericardial fluid  without pericardial nodularity. Thoracic aorta is normal in caliber with atherosclerosis.  New prevascular lymph node measuring 1.4 x 1.0 cm. Increased size lower paratracheal lymph node measuring 2.1 x 2.6 cm. Small right infrahilar lymph nodes noted. There is no right pleural effusion. Decreased interstitial opacity in the right lung from prior. Mild interstitial  prominence in the lower lobe persists. Mild scarring in the right upper lobe. No right-sided pulmonary nodule.  Evaluation of the upper abdomen demonstrates no definite acute abnormality. No acute osseous abnormality. No focal osseous lesion. Right chest port remains in place.  Review of the MIP images confirms the above findings.  IMPRESSION: 1. No pulmonary embolus. 2. Increasing size of left upper lobe/paramediastinal masslike opacity with increasing soft tissue density at the apex and infrahilar region. In conjunction with new prevascular and pretracheal lymphadenopathy, findings are concerning for progression of disease. Alternatively, infection with reactive adenopathy is considered.   Electronically Signed   By: Jeb Levering M.D.   On: 01/20/2015 05:18   Impression:Plan 1. 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   Initiation of chest radiation 04/05/2014. Radiation completed 05/30/2014.   Initiation of weekly Taxol/carboplatin chemotherapy 04/18/2014. Final weekly Taxol/carboplatin given 05/30/2014.  Restaging CT 07/11/2014 with a decrease in the left upper lobe mass, new bilateral pleural effusions  Chest CT 09/10/2014 showed a large left pleural effusion and slight increase in the size of the left upper lobe/paramediastinal mass. Cytology on the pleural fluid showed reactive mesothelial cells.  Cycle 1 Nivolumab 11/14/2014  Cycle 2 nivolumab 11/28/2014  Cycle 3 nivolumab 12/12/2014  Cycle 4 nivolumab 12/26/2014   Cycle 5 nivolumab 01/09/2015  CT chest 01/19/2015, compared to 09/10/2014-the left upper lobe/paramediastinal masslike density has increased slightly in size. Mediastinal lymph nodes were present on the 09/10/2014 CT. 2. Admission 04/10/2014 with acute CHF and acute coronary syndrome, status  post a cardiac catheterization 04/10/2014, clinical improvement with medical therapy.  3. COPD  4. Chronic renal failure  5. Diabetes  6. Neuropathy  7. History of non-Hodgkin's lymphoma  8. Atrial fibrillation.  9. Status post evaluation in the emergency department 05/13/2014 for chest pain. Question radiation esophagitis.  10. Oral candidiasis. He completed a 5 day course of Diflucan.  11. History of thrombocytopenia secondary to chemotherapy. The carboplatin was dose reduced on 05/30/2014.  12. 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 Persistent. 13. Hospitalization 07/23/2014 through 08/02/2014 with CHF. 14. Hospitalization 08/18/2014 through 08/23/2014 with CHF. 15. Bilateral pleural effusions on chest CT 07/11/2014. Status post thoracentesis 08/02/2014 and 08/22/2014. It does not appear fluid was sent for cytology. Status post thoracentesis 09/11/2014. Pathology showed reactive mesothelial cells. Status post placement of a left Pleurx catheter 09/27/2014. Cytology on pleural fluid showed no malignant cells. 16. Intermittent pain left flank region. Likely related to the Pleurx catheter. 17. Anemia secondary to chronic disease and chronic renal failure   Assessment/Plan:  [name] is admitted with increased dyspnea. The CT reveals a minimal increase in the left lung consolidation compared to the CT from November 2015. He did not start nivolumab until January 2016. The dyspnea may be related to COPD or cardiac disease. The left pleural effusion appears smaller and the Pleurx drainage has diminished.  Recommendations: 1. Continue oxygen, and bronchodilator therapy as needed 2. Management of diabetes and CHF per the medicine service 3. Continue draining  the Pleurx catheter on Monday and Thursday, consult interventional radiology for catheter removal if the drainage remains at less than 100 mL 4. I will check on him 01/21/2015 and  discuss the indication for continuing Nivolumab. I'm inclined to continue the immunotherapy since there is no clear evidence of disease progression.   LOS: 1 day   [place]  01/20/2015, 1:59 PM

## 2015-01-20 NOTE — Progress Notes (Signed)
UR complete 

## 2015-01-20 NOTE — Progress Notes (Signed)
Spoke with CT tech concerning CT angio order. CT will call for the patient when they are ready for patient. SRP, RN

## 2015-01-20 NOTE — Progress Notes (Signed)
ANTIBIOTIC CONSULT NOTE - INITIAL  Pharmacy Consult for Cefepime Indication: R/o postobstructive PNA  No Known Allergies  Patient Measurements: Height: 5\' 11"  (180.3 cm) Weight: 191 lb (86.637 kg) IBW/kg (Calculated) : 75.3   Vital Signs: Temp: 98 F (36.7 C) (03/24 2355) Temp Source: Oral (03/24 2355) BP: 94/53 mmHg (03/24 2355) Pulse Rate: 105 (03/24 2355) Intake/Output from previous day:   Intake/Output from this shift:    Labs:  Recent Labs  01/19/15 2017  WBC 14.4*  HGB 9.4*  PLT 294  CREATININE 1.00   Estimated Creatinine Clearance: 63.8 mL/min (by C-G formula based on Cr of 1). No results for input(s): VANCOTROUGH, VANCOPEAK, VANCORANDOM, GENTTROUGH, GENTPEAK, GENTRANDOM, TOBRATROUGH, TOBRAPEAK, TOBRARND, AMIKACINPEAK, AMIKACINTROU, AMIKACIN in the last 72 hours.   Microbiology: No results found for this or any previous visit (from the past 720 hour(s)).  Medical History: Past Medical History  Diagnosis Date  . Peripheral arterial disease     s/p Ao Bifem Bypass  . Diabetes mellitus without complication   . CAD (coronary artery disease)     By  CT Scan  . Hyperlipidemia   . Hypertension   . Peripheral neuropathy   . COPD (chronic obstructive pulmonary disease)   . History of lower GI bleeding   . Substance abuse   . AAA (abdominal aortic aneurysm)   . MI (myocardial infarction) 04/10/14  . Non Hodgkin's lymphoma 2000, 2010  . Lung cancer, upper lobe     left lung with left hilar lymphadenopathy; S/P radiation; S/P Taxol and carboplatin therapy; currently receiving Nivolumab  . CHF (congestive heart failure)     Combined systolic and diastolic, EF 35-46% with grade II diastolic dysfunction    Medications:  Prescriptions prior to admission  Medication Sig Dispense Refill Last Dose  . acetaminophen (TYLENOL ARTHRITIS PAIN) 650 MG CR tablet Take 650 mg by mouth 2 (two) times daily as needed for pain (pain).    01/19/2015 at Unknown time  .  amiodarone (PACERONE) 200 MG tablet Take 200 mg by mouth 2 (two) times daily.   01/19/2015 at 0730  . aspirin 81 MG tablet Take 81 mg by mouth 2 (two) times daily.    01/19/2015 at Unknown time  . citalopram (CELEXA) 20 MG tablet Take 20 mg by mouth daily.   01/19/2015 at Unknown time  . docusate sodium 100 MG CAPS Take 100 mg by mouth 2 (two) times daily. 10 capsule 0 01/19/2015 at Unknown time  . feeding supplement, GLUCERNA SHAKE, (GLUCERNA SHAKE) LIQD Take 237 mLs by mouth daily.  0 01/19/2015 at Unknown time  . finasteride (PROSCAR) 5 MG tablet Take 5 mg by mouth daily.   01/19/2015 at Unknown time  . furosemide (LASIX) 40 MG tablet Take 40 mg by mouth daily.   01/19/2015 at Unknown time  . gabapentin (NEURONTIN) 600 MG tablet Take 600 mg by mouth 2 (two) times daily.    01/19/2015 at Unknown time  . Glucosamine-Chondroit-Vit C-Mn (GLUCOSAMINE CHONDR 1500 COMPLX) CAPS Take 1 capsule by mouth daily.   01/19/2015 at Unknown time  . insulin aspart (NOVOLOG) 100 UNIT/ML injection Inject 5 Units into the skin 2 (two) times daily.   01/19/2015 at Unknown time  . insulin glargine (LANTUS) 100 UNIT/ML injection Inject 0.18 mLs (18 Units total) into the skin at bedtime. (Patient taking differently: Inject 10 Units into the skin at bedtime. )   01/18/2015 at Unknown time  . linagliptin (TRADJENTA) 5 MG TABS tablet Take 1 tablet (5 mg  total) by mouth daily. With largest meal 30 tablet 3 01/19/2015 at Unknown time  . metoprolol tartrate (LOPRESSOR) 25 MG tablet Take 12.5 mg by mouth daily.   01/19/2015 at 0730  . oxyCODONE-acetaminophen (PERCOCET/ROXICET) 5-325 MG per tablet Take one tablet by mouth every 4 hours as needed for pain 180 tablet 0 01/19/2015 at Unknown time  . pantoprazole (PROTONIX) 40 MG tablet Take 40 mg by mouth every evening.   01/18/2015 at Unknown time  . potassium chloride 20 MEQ TBCR Take 40 mEq by mouth daily. 30 tablet 3 01/19/2015 at Unknown time  . sucralfate (CARAFATE) 1 G tablet Take 1 tablet  (1 g total) by mouth 4 (four) times daily. 30 tablet 0 01/19/2015 at Unknown time  . Tamsulosin HCl (FLOMAX) 0.4 MG CAPS Take 0.4 mg by mouth daily.   01/19/2015 at Unknown time  . traMADol (ULTRAM) 50 MG tablet Take 1 tablet (50 mg total) by mouth every 12 (twelve) hours as needed. 60 tablet 0 01/19/2015 at Unknown time   Scheduled:  . amiodarone  200 mg Oral BID  . aspirin  81 mg Oral BID  . ceFEPime (MAXIPIME) IV  1 g Intravenous Q8H  . citalopram  20 mg Oral Daily  . docusate sodium  100 mg Oral BID  . enoxaparin (LOVENOX) injection  40 mg Subcutaneous QHS  . finasteride  5 mg Oral Daily  . furosemide  40 mg Intravenous Q12H  . gabapentin  600 mg Oral BID  . insulin aspart  0-9 Units Subcutaneous TID WC  . insulin glargine  10 Units Subcutaneous QHS  . metoprolol tartrate  12.5 mg Oral Daily  . pantoprazole  40 mg Oral QPM  . potassium chloride  40 mEq Oral Daily  . sodium chloride  3 mL Intravenous Q12H  . sucralfate  1 g Oral QID  . tamsulosin  0.4 mg Oral Daily   Infusions:   Assessment: 82 yoM with a history of NSCLC (left upper lobe mass, currently undergoing treatment with Nivolumab), with chronic effusion. Rx asked to dose Cefepime since post-obstructive pneumonia cannot be ruled out.    Goal of Therapy:  Treat infection  Plan:   Cefepime 1Gm IV q8h  F/u Scr/cultures as needed  Dorrene German 01/20/2015,12:46 AM

## 2015-01-20 NOTE — Progress Notes (Signed)
Pleurx drained per home regimen and MD order. 50cc of fluid returned. Pt tolerated well. Callie Fielding RN

## 2015-01-20 NOTE — Progress Notes (Signed)
Pt's BP has been running in the 38'H systolic all day. MD has been kept aware throughout shift. Order parameters have been added to BP meds and Lasix. Will continue to monitor. Callie Fielding RN

## 2015-01-20 NOTE — Clinical Documentation Improvement (Signed)
Possible Clinical Conditions?  Acute Respiratory Failure Acute on Chronic Respiratory Failure Chronic Respiratory Failure Other Condition Cannot Clinically Determine   Supporting Information:(As per notes) "Hypoxia, acute on chronic--O2 sat stable with 4L [place]; hopefully he will wean back down to baseline requirement of 2L" "Patient presents with dyspnea. Patient has increased oxygen requirement here from his home 24/7 nasal cannula 2 L requirement. Patient also has evidence for increased left-sided pleural effusion, likely malignant effusion."  Thank You, Alessandra Grout, RN, BSN, CCDS,Clinical Documentation Specialist:  [phone]  [phone]=[employer] [place]- [employer] Information Management

## 2015-01-20 NOTE — Progress Notes (Signed)
  Echocardiogram 2D Echocardiogram has been performed.  [name] [name] 01/20/2015, 4:27 PM

## 2015-01-20 NOTE — Progress Notes (Signed)
Text page sent to MD to review CT results. SRP, RN

## 2015-01-20 NOTE — Progress Notes (Addendum)
[facility] Progress Note   [name] [name] NFA:[id number] DOB: 1933-05-13 DOA: 01/19/2015 PCP: [name], MD  Brief narrative: [name] [name] is a 81 y.o. male with a history of NSCLC (left upper lobe mass, currently undergoing treatment with Nivolumab), combined systolic and diastolic heart failure (last echo September 2015, EF 25-30% at that time), and persistent pleural effusion on left S/P Pleurx catheter placement in December 2015 (drained twice weekly by home health nurse). He presents with progressive shortness of breath. He tells me he is on 3 L O2 at home. He has a chronic cough with clear sputum.    Subjective: Cannot tell if his shortness of breath has improved yet. He ambulated in the room and per RN became very short of breath- pulse ox dropped to high 80s. Again, denies a new cough and fevers.   Assessment/Plan: Principal Problem:  Dyspnea on exertion - on 3 L O2 at baseline- pulse ox in high 80s on exertion- I do not know his baseline pulse ox  -  no effusions noted on CT- may have a pneumonia but does not have symptoms suggestive of it- he does have an elevated WBC count - on Cefepime -  - has been diuresed for "slight increased vascularity on the right" seen on CXR and BP now in 80s therefore, cannot give further Lasix- I do not feel there is much heart failure - does not appear to be a COPD exacerbation as he is not wheezing- the patient does is not even aware of a diagnosis of COPD and I see no notes from pulmonary in EPIC - if symptoms do not improve with antibiotics, in next 48 hrs, dyspnea on exertion likely from deconditioning vs CAD (see below) have ordered PT eval  Active Problems:  Elevated TNI- h/o CAD- medically managed - has a significant amount of CAD on cath in 04/10/14 Per Dr Croitoru's last note in 11/15- "He was found to have 70% ostial left main and 80% distal left main stenosis and lengthy calcified 50% proximal LAD stenosis as well as extensive  stenoses in the mid right and distal right coronary artery. Because of the complexity of disease and the lung cancer diagnosis, he was continued on medical therapy" - due to his lung cancer, I am doubtful cardiology would want to do any procedures- Hospice was recommended at that time  Hyponatremia - f/u on Urine sodium/ osm- Despite an elevated BNP, he appears to be over diuresed as evidenced by his hypotension     Squamous cell carcinoma lung - Dr Ammie Dalton has evaluated the patient- he does not feel cancer is progressing as is mentioned on the CT report- he will continue treatment.    Diabetes mellitus type 2, uncontrolled, without complications -sugars quite high-  increase Lantus back to home dose today- cont sliding scale- resume pre-meal Novolog (TID instead of BID) and Tradjenta   Code Status: full code Family Communication:  Disposition Plan: to be determined DVT prophylaxis: Lovenox Consultants:oncology Procedures:   Antibiotics: Anti-infectives    Start     Dose/Rate Route Frequency Ordered Stop   01/20/15 0000  ceFEPIme (MAXIPIME) 1 g in dextrose 5 % 50 mL IVPB     1 g 100 mL/hr over 30 Minutes Intravenous Every 8 hours 01/19/15 2347        Objective: Filed Weights   01/19/15 2355 01/20/15 0446  Weight: 86.637 kg (191 lb) 86.8 kg (191 lb 5.8 oz)    Intake/Output Summary (Last 24 hours) at  01/20/15 1522 Last data filed at 01/20/15 1447  Gross per 24 hour  Intake     20 ml  Output      0 ml  Net     20 ml     Vitals Filed Vitals:   01/20/15 0446 01/20/15 0924 01/20/15 0948 01/20/15 1440  BP: 90/56 88/44 82/52  82/56  Pulse: 94   86  Temp: 99.1 F (37.3 C)   97.9 F (36.6 C)  TempSrc: Oral   Oral  Resp: 17   17  Height:      Weight: 86.8 kg (191 lb 5.8 oz)     SpO2: 99%   100%    Exam:  General:  Pt is alert, not in acute distress  HEENT: No icterus, No thrush  Cardiovascular: regular rate and rhythm, S1/S2 No murmur  Respiratory: clear to  auscultation bilaterally   Abdomen: Soft, +Bowel sounds, non tender, non distended, no guarding  MSK: No LE edema, cyanosis or clubbing  Data Reviewed: Basic Metabolic Panel:  Recent Labs Lab 01/19/15 2017 01/20/15 0220  NA 128* 129*  K 4.2 3.6  CL 89* 88*  CO2 28 29  GLUCOSE 274* 359*  BUN 14 16  CREATININE 1.00 1.06  CALCIUM 8.0* 8.1*   Liver Function Tests:  Recent Labs Lab 01/20/15 0220  AST 14  ALT 9  ALKPHOS 72  BILITOT 0.9  PROT 6.4  ALBUMIN 2.3*   No results for input(s): LIPASE, AMYLASE in the last 168 hours. No results for input(s): AMMONIA in the last 168 hours. CBC:  Recent Labs Lab 01/19/15 2017 01/20/15 0220  WBC 14.4* 14.3*  HGB 9.4* 9.1*  HCT 29.7* 28.2*  MCV 91.7 91.6  PLT 294 291   Cardiac Enzymes:  Recent Labs Lab 01/20/15 0220 01/20/15 0900  TROPONINI 0.05* 0.09*   BNP (last 3 results)  Recent Labs  01/19/15 2017 01/20/15 0220  BNP 641.3* 828.5*    ProBNP (last 3 results)  Recent Labs  08/18/14 1512 08/22/14 0535 09/10/14 1744  PROBNP 22975.0* 11824.0* 11604.0*    CBG:  Recent Labs Lab 01/20/15 0004 01/20/15 0805 01/20/15 1153  GLUCAP 303* 268* 315*    Recent Results (from the past 240 hour(s))  MRSA PCR Screening     Status: None   Collection Time: 01/20/15 12:31 AM  Result Value Ref Range Status   MRSA by PCR NEGATIVE NEGATIVE Final    Comment:        The GeneXpert MRSA Assay (FDA approved for NASAL specimens only), is one component of a comprehensive MRSA colonization surveillance program. It is not intended to diagnose MRSA infection nor to guide or monitor treatment for MRSA infections.      Studies:  Recent x-ray studies have been reviewed in detail by the Attending Physician  Scheduled Meds:  Scheduled Meds: . amiodarone  200 mg Oral BID  . aspirin  81 mg Oral BID  . ceFEPime (MAXIPIME) IV  1 g Intravenous Q8H  . citalopram  20 mg Oral Daily  . docusate sodium  100 mg Oral BID   . enoxaparin (LOVENOX) injection  40 mg Subcutaneous QHS  . finasteride  5 mg Oral Daily  . furosemide  40 mg Intravenous Q12H  . gabapentin  600 mg Oral BID  . insulin aspart  0-9 Units Subcutaneous TID WC  . insulin glargine  15 Units Subcutaneous QHS  . linagliptin  5 mg Oral Q supper  . metoprolol tartrate  12.5 mg Oral Daily  .  pantoprazole  40 mg Oral QPM  . potassium chloride  40 mEq Oral Daily  . sodium chloride  10-40 mL Intracatheter Q12H  . sodium chloride  3 mL Intravenous Q12H  . sucralfate  1 g Oral QID  . tamsulosin  0.4 mg Oral Daily   Continuous Infusions:   Time spent on care of this patient: 24 min  [place], MD 01/20/2015, 3:22 PM  LOS: 1 day   [facility] Office  [phone] Pager - Text Page per [email or web address]  If 7PM-7AM, please contact night-coverage [email or web address]

## 2015-01-21 DIAGNOSIS — J9601 Acute respiratory failure with hypoxia: Secondary | ICD-10-CM

## 2015-01-21 DIAGNOSIS — C3490 Malignant neoplasm of unspecified part of unspecified bronchus or lung: Secondary | ICD-10-CM

## 2015-01-21 DIAGNOSIS — R0902 Hypoxemia: Secondary | ICD-10-CM

## 2015-01-21 LAB — BASIC METABOLIC PANEL
Anion gap: 12 (ref 5–15)
Anion gap: 9 (ref 5–15)
BUN: 19 mg/dL (ref 6–23)
BUN: 22 mg/dL (ref 6–23)
CALCIUM: 8 mg/dL — AB (ref 8.4–10.5)
CHLORIDE: 89 mmol/L — AB (ref 96–112)
CO2: 28 mmol/L (ref 19–32)
CO2: 31 mmol/L (ref 19–32)
CREATININE: 1.16 mg/dL (ref 0.50–1.35)
Calcium: 7.5 mg/dL — ABNORMAL LOW (ref 8.4–10.5)
Chloride: 89 mmol/L — ABNORMAL LOW (ref 96–112)
Creatinine, Ser: 1.15 mg/dL (ref 0.50–1.35)
GFR calc Af Amer: 68 mL/min — ABNORMAL LOW (ref >90)
GFR calc non Af Amer: 59 mL/min — ABNORMAL LOW (ref >90)
GFR calc non Af Amer: 58 mL/min — ABNORMAL LOW (ref >90)
GFR, EST AFRICAN AMERICAN: 67 mL/min — AB (ref >90)
GLUCOSE: 348 mg/dL — AB (ref 70–99)
Glucose, Bld: 201 mg/dL — ABNORMAL HIGH (ref 70–99)
Potassium: 3.7 mmol/L (ref 3.5–5.1)
Potassium: 3.4 mmol/L — ABNORMAL LOW (ref 3.5–5.1)
SODIUM: 129 mmol/L — AB (ref 135–145)
Sodium: 129 mmol/L — ABNORMAL LOW (ref 135–145)

## 2015-01-21 LAB — GLUCOSE, CAPILLARY
GLUCOSE-CAPILLARY: 190 mg/dL — AB (ref 70–99)
Glucose-Capillary: 253 mg/dL — ABNORMAL HIGH (ref 70–99)
Glucose-Capillary: 355 mg/dL — ABNORMAL HIGH (ref 70–99)
Glucose-Capillary: 244 mg/dL — ABNORMAL HIGH (ref 70–99)

## 2015-01-21 LAB — COMPREHENSIVE METABOLIC PANEL
ALBUMIN: 2.1 g/dL — AB (ref 3.5–5.2)
ALT: 12 U/L (ref 0–53)
AST: 22 U/L (ref 0–37)
Alkaline Phosphatase: 71 U/L (ref 39–117)
Anion gap: 11 (ref 5–15)
BILIRUBIN TOTAL: 0.7 mg/dL (ref 0.3–1.2)
BUN: 26 mg/dL — ABNORMAL HIGH (ref 6–23)
CALCIUM: 8 mg/dL — AB (ref 8.4–10.5)
CHLORIDE: 90 mmol/L — AB (ref 96–112)
CO2: 30 mmol/L (ref 19–32)
Creatinine, Ser: 1.28 mg/dL (ref 0.50–1.35)
GFR calc Af Amer: 60 mL/min — ABNORMAL LOW (ref >90)
GFR, EST NON AFRICAN AMERICAN: 52 mL/min — AB (ref >90)
Glucose, Bld: 198 mg/dL — ABNORMAL HIGH (ref 70–99)
POTASSIUM: 4.2 mmol/L (ref 3.5–5.1)
Sodium: 131 mmol/L — ABNORMAL LOW (ref 135–145)
Total Protein: 6.7 g/dL (ref 6.0–8.3)

## 2015-01-21 LAB — CBC
HEMATOCRIT: 30.6 % — AB (ref 39.0–52.0)
HEMOGLOBIN: 9.6 g/dL — AB (ref 13.0–17.0)
MCH: 28.7 pg (ref 26.0–34.0)
MCHC: 31.4 g/dL (ref 30.0–36.0)
MCV: 91.6 fL (ref 78.0–100.0)
Platelets: 319 10*3/uL (ref 150–400)
RBC: 3.34 MIL/uL — AB (ref 4.22–5.81)
RDW: 14.2 % (ref 11.5–15.5)
WBC: 12.5 10*3/uL — ABNORMAL HIGH (ref 4.0–10.5)

## 2015-01-21 LAB — HEMOGLOBIN A1C
Hgb A1c MFr Bld: 9.9 % — ABNORMAL HIGH (ref 4.8–5.6)
Mean Plasma Glucose: 237 mg/dL

## 2015-01-21 MED ORDER — INSULIN ASPART 100 UNIT/ML ~~LOC~~ SOLN
5.0000 [IU] | Freq: Three times a day (TID) | SUBCUTANEOUS | Status: DC
  Administered 2015-01-21 – 2015-01-22 (×4): 5 [IU] via SUBCUTANEOUS

## 2015-01-21 MED ORDER — METOPROLOL TARTRATE 25 MG PO TABS: 12.5000 mg | ORAL_TABLET | Freq: Two times a day (BID) | ORAL | Status: DC

## 2015-01-21 MED ORDER — INSULIN GLARGINE 100 UNIT/ML ~~LOC~~ SOLN
22.0000 [IU] | Freq: Every day | SUBCUTANEOUS | Status: DC
  Administered 2015-01-21: 22 [IU] via SUBCUTANEOUS
  Filled 2015-01-21: qty 0.22

## 2015-01-21 NOTE — Progress Notes (Signed)
Pt new onset A.Fib, rate 99.  No signs/ symptoms.  MD notified. Andre Lefort

## 2015-01-21 NOTE — Progress Notes (Addendum)
As we began to ambulate the pt. He Sat were 77% O2 increased from 4L to 6L.  Sats slowly trended up able to ambulate the full length of the nurses unit. Upon return back to his room pt sat dropped to 73-86% increased to 8 liters.  We did take rest periods along the way. Once we were within  100 feet of patients room his sat dropped to 60-64 increased O2 to 10 L. Sat 91%. Once back at the bedside and pt able to rest. Sat return to mid 91% .  Asked  central tele to track sats during this time and they were in the 70-80's.......Marland Kitchen Will notify md.

## 2015-01-21 NOTE — Progress Notes (Signed)
[facility] Progress Note   [name] [name]:[id number] DOB: [birth year] DOA: 01/19/2015 PCP: [name], MD  Brief narrative: [name] [name] is a 81 y.o. male with a history of NSCLC (left upper lobe mass, currently undergoing treatment with Nivolumab), combined systolic and diastolic heart failure (last echo September 2015, EF 25-30% at that time), and persistent pleural effusion on left S/P Pleurx catheter placement in December 2015 (drained twice weekly by home health nurse). He presents with progressive shortness of breath. He tells me he is on 3 L O2 at home. He has a chronic cough with clear sputum which is unchanged from baseline.    Subjective: Continues to have the same shortness of breath per his family who are sitting at his bedside. The patient ambulated down to the elevators this AM with the nurse and became very tired.    Assessment/Plan: Principal Problem:  Dyspnea on exertion - on 3 L O2 at baseline- pulse ox in high 80s on exertion- I do not know his baseline pulse ox  -  no effusions noted on CT- may have a pneumonia but does not have new symptoms suggestive of it- he does have an elevated WBC count - on Cefepime -  - has been diuresed for "slight increased vascularity on the right" seen on CXR and BP dropped to 80s therefore, cannot give further Lasix- I do not feel there is much heart failure - does not appear to be a COPD exacerbation as he is not wheezing- the patient does is not even aware of a diagnosis of COPD and I see no notes from pulmonary in EP[facility] - 3/26- per family(son and daughter at bedside) and per patient, his cough is chronic and there has not been an increase in cough or sputum production-  - if symptoms do not improve with antibiotics, by tomorrow, dyspnea on exertion likely from deconditioning vs CAD (see below) have ordered PT eval   Active Problems:  Elevated TNI- h/o CAD- medically managed - this may be the cause of his dyspnea on  exertion - had a significant amount of CAD on cath in 04/10/14 Per Dr Croitoru's last note in 11/15- "He was found to have 70% ostial left main and 80% distal left main stenosis and lengthy calcified 50% proximal LAD stenosis as well as extensive stenoses in the mid right and distal right coronary artery. Because of the complexity of disease and the lung cancer diagnosis, he was continued on medical therapy" - due to his lung cancer, I am doubtful cardiology would want to do any procedures- Hospice was recommended at that time- his children at his bedside are well aware of this already and neither they nor the patient want further work up  A-fib - converted to A-fib this AM- rate is controlled although Metoprolol was not given yesterday as BP was 67M systolic -  patient is aware that this has happened in the past- (last year) -  Cardiology note mentions that anticoagulation was not indicated- he remains on a baby aspirin which I will continue- again, the plan was to focus on palliative care  Hyponatremia/ hypochloremia  - asymptomatic -  - Urine sodium ordered but still is not reported-  Osm is low - given Lasix IV x 4 doses- sodium is not improving yet- suspect SIADH - Despite an elevated BNP, he now appears to be over diuresed as evidenced by his hypotension with SBP in 80s on 3/25 - diuretics on hold now - no pulm edema  noted on CT chest on 3/25    Squamous cell carcinoma lung - Dr Ammie Dalton has evaluated the patient- he does not feel cancer is progressing as is mentioned on the CT report- he will continue treatment.    Diabetes mellitus type 2, uncontrolled, without complications -sugars quite high-  increases Lantus and mealtime insulin further today- cont sliding scale- resume pre-meal Novolog (TID instead of BID) and Tradjenta   Code Status: full code Family Communication:  Disposition Plan: to be determined DVT prophylaxis: Lovenox Consultants:oncology Procedures:    Antibiotics: Anti-infectives    Start     Dose/Rate Route Frequency Ordered Stop   01/20/15 0000  ceFEPIme (MAXIPIME) 1 g in dextrose 5 % 50 mL IVPB     1 g 100 mL/hr over 30 Minutes Intravenous Every 8 hours 01/19/15 2347        Objective: Filed Weights   01/19/15 2355 01/20/15 0446 01/21/15 0450  Weight: 86.637 kg (191 lb) 86.8 kg (191 lb 5.8 oz) 85.9 kg (189 lb 6 oz)    Intake/Output Summary (Last 24 hours) at 01/21/15 1021 Last data filed at 01/21/15 0859  Gross per 24 hour  Intake   1080 ml  Output    800 ml  Net    280 ml     Vitals Filed Vitals:   01/20/15 1440 01/20/15 2100 01/21/15 0450 01/21/15 0458  BP: 82/56 103/54 89/50 92/52   Pulse: 86 94 77   Temp: 97.9 F (36.6 C) 98.2 F (36.8 C) 97.7 F (36.5 C)   TempSrc: Oral Oral Oral   Resp: 17 19 18    Height:      Weight:   85.9 kg (189 lb 6 oz)   SpO2: 100% 99% 1%     Exam:  General:  Pt is alert, not in acute distress  HEENT: No icterus, No thrush  Cardiovascular: regular rate and rhythm, S1/S2 No murmur  Respiratory: clear to auscultation bilaterally - noted to be coughing up clear sputum  Abdomen: Soft, +Bowel sounds, non tender, non distended, no guarding  MSK: No LE edema, cyanosis or clubbing  Data Reviewed: Basic Metabolic Panel:  Recent Labs Lab 01/19/15 2017 01/20/15 0220 01/21/15 0915  NA 128* 129* 129*  K 4.2 3.6 3.7  CL 89* 88* 89*  CO2 28 29 28   GLUCOSE 274* 359* 348*  BUN 14 16 19   CREATININE 1.00 1.06 1.15  CALCIUM 8.0* 8.1* 8.0*   Liver Function Tests:  Recent Labs Lab 01/20/15 0220  AST 14  ALT 9  ALKPHOS 72  BILITOT 0.9  PROT 6.4  ALBUMIN 2.3*   No results for input(s): LIPASE, AMYLASE in the last 168 hours. No results for input(s): AMMONIA in the last 168 hours. CBC:  Recent Labs Lab 01/19/15 2017 01/20/15 0220 01/21/15 0915  WBC 14.4* 14.3* 12.5*  HGB 9.4* 9.1* 9.6*  HCT 29.7* 28.2* 30.6*  MCV 91.7 91.6 91.6  PLT 294 291 319   Cardiac  Enzymes:  Recent Labs Lab 01/20/15 0220 01/20/15 0900 01/20/15 1440  TROPONINI 0.05* 0.09* 0.10*   BNP (last 3 results)  Recent Labs  01/19/15 2017 01/20/15 0220  BNP 641.3* 828.5*    ProBNP (last 3 results)  Recent Labs  08/18/14 1512 08/22/14 0535 09/10/14 1744  PROBNP 22975.0* 11824.0* 11604.0*    CBG:  Recent Labs Lab 01/20/15 0805 01/20/15 1153 01/20/15 1713 01/20/15 2137 01/21/15 0735  GLUCAP 268* 315* 219* 265* 253*    Recent Results (from the past 240 hour(s))  MRSA PCR Screening     Status: None   Collection Time: 01/20/15 12:31 AM  Result Value Ref Range Status   MRSA by PCR NEGATIVE NEGATIVE Final    Comment:        The GeneXpert MRSA Assay (FDA approved for NASAL specimens only), is one component of a comprehensive MRSA colonization surveillance program. It is not intended to diagnose MRSA infection nor to guide or monitor treatment for MRSA infections.      Studies:  Recent x-ray studies have been reviewed in detail by the Attending Physician  Scheduled Meds:  Scheduled Meds: . amiodarone  200 mg Oral BID  . aspirin  81 mg Oral BID  . ceFEPime (MAXIPIME) IV  1 g Intravenous Q8H  . citalopram  20 mg Oral Daily  . docusate sodium  100 mg Oral BID  . enoxaparin (LOVENOX) injection  40 mg Subcutaneous QHS  . finasteride  5 mg Oral Daily  . furosemide  40 mg Intravenous Q12H  . gabapentin  600 mg Oral BID  . insulin aspart  0-9 Units Subcutaneous TID WC  . insulin aspart  5 Units Subcutaneous TID WC  . insulin glargine  22 Units Subcutaneous QHS  . linagliptin  5 mg Oral Q supper  . metoprolol tartrate  12.5 mg Oral Daily  . pantoprazole  40 mg Oral QPM  . potassium chloride  40 mEq Oral Daily  . sodium chloride  10-40 mL Intracatheter Q12H  . sodium chloride  3 mL Intravenous Q12H  . sucralfate  1 g Oral QID  . tamsulosin  0.4 mg Oral Daily   Continuous Infusions:   Time spent on care of this patient: 29  min  [place], MD 01/21/2015, 10:21 AM  LOS: 2 days   [facility] Office  [phone] Pager - Text Page per [email or web address]  If 7PM-7AM, please contact night-coverage [email or web address]

## 2015-01-21 NOTE — Progress Notes (Signed)
IP PROGRESS NOTE  Subjective:   [name] reports feeling better. His dyspnea is at baseline.  Objective: Vital signs in last 24 hours: Blood pressure 92/52, pulse 77, temperature 97.7 F (36.5 C), temperature source Oral, resp. rate 18, height 5\' 11"  (1.803 m), weight 189 lb 6 oz (85.9 kg), SpO2 1 %.  Intake/Output from previous day: 03/25 0701 - 03/26 0700 In: 940 [P.O.:720; I.V.:20; IV Piggyback:200] Out: 600 [Urine:600]  Physical Exam:  Musculoskeletal: Left Pleurx catheter site with a gauze dressing, mild erythema at the posterior aspect of the dressing, nontender    Lab Results:  Recent Labs  01/19/15 2017 01/20/15 0220  WBC 14.4* 14.3*  HGB 9.4* 9.1*  HCT 29.7* 28.2*  PLT 294 291    BMET  Recent Labs  01/19/15 2017 01/20/15 0220  NA 128* 129*  K 4.2 3.6  CL 89* 88*  CO2 28 29  GLUCOSE 274* 359*  BUN 14 16  CREATININE 1.00 1.06  CALCIUM 8.0* 8.1*    Studies/Results: Dg Chest 2 View (if Patient Has Fever And/or Copd)  01/19/2015   CLINICAL DATA:  History of lung carcinoma chronic left-sided chest pain  EXAM: CHEST  2 VIEW  COMPARISON:  01/09/2015  FINDINGS: Persistent opacification in the upper left chest is noted stable from the prior exam. A PleurX catheter is again noted on the left a pleural effusion is noted on the left slightly greater than that seen on the prior exam. A right chest wall port is noted. Some slight increased density is noted in the mid right lung which may represent some vascular congestion. Focal early infiltrate cannot be totally excluded.  IMPRESSION: Slight increase in the degree of left-sided pleural effusion.  Chronic changes on the left.  Slight increased vascularity on the right which may be related some vascular congestion.   Electronically Signed   By: Inez Catalina M.D.   On: 01/19/2015 20:21   Ct Angio Chest Pe W/cm &/or Wo Cm  01/20/2015   CLINICAL DATA:  81 year old male with hypoxia and hypotension. Left lung cancer  post radiation. Ongoing chemotherapy.  EXAM: CT ANGIOGRAPHY CHEST WITH CONTRAST  TECHNIQUE: Multidetector CT imaging of the chest was performed using the standard protocol during bolus administration of intravenous contrast. Multiplanar CT image reconstructions and MIPs were obtained to evaluate the vascular anatomy.  CONTRAST:  172mL OMNIPAQUE IOHEXOL 350 MG/ML SOLN  COMPARISON:  Chest radiograph 1 day prior. Most recent chest CT 09/10/2014  FINDINGS: There are no filling defects within the pulmonary arteries to suggest pulmonary embolus.  Left pleural drain, with tip at the lung apex. There is small left basilar pleural effusion with small adjacent foci of air. Left upper lobe paramediastinal mass like opacity measures 13.5 x 4.5 cm, increased from prior where it measured 4 x 10 cm. There is increasing soft tissue density at the left lung apex with scattered air bronchograms. Increasing soft tissue density in the left perihilar region. Minimal atelectasis adjacent to the left basilar pleural effusion.  The heart size is mildly enlarged. Coronary artery calcifications are seen. Small volume of pericardial fluid without pericardial nodularity. Thoracic aorta is normal in caliber with atherosclerosis.  New prevascular lymph node measuring 1.4 x 1.0 cm. Increased size lower paratracheal lymph node measuring 2.1 x 2.6 cm. Small right infrahilar lymph nodes noted. There is no right pleural effusion. Decreased interstitial opacity in the right lung from prior. Mild interstitial prominence in the lower lobe persists. Mild scarring in the right upper  lobe. No right-sided pulmonary nodule.  Evaluation of the upper abdomen demonstrates no definite acute abnormality. No acute osseous abnormality. No focal osseous lesion. Right chest port remains in place.  Review of the MIP images confirms the above findings.  IMPRESSION: 1. No pulmonary embolus. 2. Increasing size of left upper lobe/paramediastinal masslike opacity with  increasing soft tissue density at the apex and infrahilar region. In conjunction with new prevascular and pretracheal lymphadenopathy, findings are concerning for progression of disease. Alternatively, infection with reactive adenopathy is considered.   Electronically Signed   By: Jeb Levering M.D.   On: 01/20/2015 05:18   Impression:Plan 1. 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   Initiation of chest radiation 04/05/2014. Radiation completed 05/30/2014.   Initiation of weekly Taxol/carboplatin chemotherapy 04/18/2014. Final weekly Taxol/carboplatin given 05/30/2014.  Restaging CT 07/11/2014 with a decrease in the left upper lobe mass, new bilateral pleural effusions  Chest CT 09/10/2014 showed a large left pleural effusion and slight increase in the size of the left upper lobe/paramediastinal mass. Cytology on the pleural fluid showed reactive mesothelial cells.  Cycle 1 Nivolumab 11/14/2014  Cycle 2 nivolumab 11/28/2014  Cycle 3 nivolumab 12/12/2014  Cycle 4 nivolumab 12/26/2014   Cycle 5 nivolumab 01/09/2015  CT chest 01/19/2015, compared to 09/10/2014-the left upper lobe/paramediastinal masslike density has increased slightly in size. Mediastinal lymph nodes were present on the 09/10/2014 CT. 2. Admission 04/10/2014 with acute CHF and acute coronary syndrome, status post a cardiac catheterization 04/10/2014, clinical improvement with medical therapy.  3. COPD  4. Chronic renal failure  5. Diabetes  6. Neuropathy  7. History of non-Hodgkin's lymphoma  8. Atrial fibrillation.  9. Status post evaluation in the emergency department 05/13/2014 for chest pain. Question radiation esophagitis.  10. Oral candidiasis. He completed a 5 day course of Diflucan.  11. History of thrombocytopenia  secondary to chemotherapy. The carboplatin was dose reduced on 05/30/2014.  12. 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 Persistent. 13. Hospitalization 07/23/2014 through 08/02/2014 with CHF. 14. Hospitalization 08/18/2014 through 08/23/2014 with CHF. 15. Bilateral pleural effusions on chest CT 07/11/2014. Status post thoracentesis 08/02/2014 and 08/22/2014. It does not appear fluid was sent for cytology. Status post thoracentesis 09/11/2014. Pathology showed reactive mesothelial cells. Status post placement of a left Pleurx catheter 09/27/2014. Cytology on pleural fluid showed no malignant cells. 16. Intermittent pain left flank region. Likely related to the Pleurx catheter. 17. Anemia secondary to chronic disease and chronic renal failure   Assessment/Plan:  [name] appears stable.  Recommendations: 1. Follow-up as scheduled at the Cancer center for Nivolumab 01/23/2015 2. Management of COPD/CHF and discharge plans per the medicine service  Please call Oncology as needed. I will see him as an outpatient.   LOS: 2 days   Algie Westry  01/21/2015, 8:02 AM

## 2015-01-21 NOTE — Evaluation (Signed)
Physical Therapy Evaluation Patient Details Name: [name] [name] MRN: [id number] DOB: 31-Mar-1933 Today's Date: 01/21/2015   History of Present Illness  [name] [name] is a 81 y.o. male adm  with progressive SOB; PMHx: of NSCLC   Clinical Impression  Pt admitted with above diagnosis. Pt currently with functional limitations due to the deficits listed below (see PT Problem List).  Pt will benefit from skilled PT to increase their independence and safety with mobility to allow discharge to the venue listed below.  Pt back in NSR at time of eval, continues to be on 4L of O2.     Follow Up Recommendations Home health PT    Equipment Recommendations  None recommended by PT (pt reports he has RW)    Recommendations for Other Services       Precautions / Restrictions Precautions Precaution Comments: monitor sats      Mobility  Bed Mobility               General bed mobility comments: pt on EOB  Transfers Overall transfer level: Needs assistance Equipment used: Rolling walker (2 wheeled) Transfers: Sit to/from Stand Sit to Stand: Supervision         General transfer comment: cues for safety  Ambulation/Gait Ambulation/Gait assistance: Min guard Ambulation Distance (Feet): 300 Feet Assistive device: Rolling walker (2 wheeled) Gait Pattern/deviations: Step-to pattern;Step-through pattern;Trunk flexed     General Gait Details: one standing rest (<10sec); mild DOE during amb--2 to 3/4; see vitals for O2 sats  Stairs            Wheelchair Mobility    Modified Rankin (Stroke Patients Only)       Balance Overall balance assessment: Needs assistance   Sitting balance-Leahy Scale: Good     Standing balance support: No upper extremity supported;Bilateral upper extremity supported Standing balance-Leahy Scale: Fair                               Pertinent Vitals/Pain Pain Assessment: No/denies pain  O2 sats 97% at  Rest on 4L O2 O2 sats  decr  to 86% (on 4 to 6L O2) after  amb ~150' O2 sats remained 90s on 6L for last 150'amb , pt decr to 4L at rest and sats maintained >93%  Pt with mild DOE and conversant throughout above gait distance    Home Living Family/patient expects to be discharged to:: Private residence Living Arrangements: Alone Available Help at Discharge: Family;Available PRN/intermittently Type of Home: Apartment Home Access: Level entry     Home Layout: One level Home Equipment: Walker - 2 wheels;Cane - single point      Prior Function                 Hand Dominance        Extremity/Trunk Assessment   Upper Extremity Assessment: Overall WFL for tasks assessed           Lower Extremity Assessment: Overall WFL for tasks assessed (fatigues easily)         Communication   Communication: No difficulties  Cognition Arousal/Alertness: Awake/alert Behavior During Therapy: WFL for tasks assessed/performed Overall Cognitive Status: Within Functional Limits for tasks assessed                      General Comments      Exercises        Assessment/Plan    PT Assessment Patient needs  continued PT services  PT Diagnosis Difficulty walking   PT Problem List Decreased mobility;Decreased activity tolerance;Decreased range of motion;Decreased strength;Decreased knowledge of use of DME  PT Treatment Interventions DME instruction;Gait training;Functional mobility training;Therapeutic activities;Patient/family education;Therapeutic exercise   PT Goals (Current goals can be found in the Care Plan section) Acute Rehab PT Goals Patient Stated Goal: home PT Goal Formulation: With patient Time For Goal Achievement: 01/28/15 Potential to Achieve Goals: Good    Frequency Min 3X/week   Barriers to discharge        Co-evaluation               End of Session   Activity Tolerance: Patient tolerated treatment well Patient left: with call bell/phone within reach;Other (comment)  (EOB) Nurse Communication: Mobility status         Time: 6226-3335 PT Time Calculation (min) (ACUTE ONLY): 25 min   Charges:   PT Evaluation $Initial PT Evaluation Tier I: 1 Procedure PT Treatments $Gait Training: 8-22 mins   PT G Codes:        Alwilda Gilland 81 2015/02/20, 2:07 PM

## 2015-01-21 NOTE — Progress Notes (Signed)
PT Cancellation Note  Patient Details Name: [name] [name] MRN: [id number] DOB: April 06, 1933   Cancelled Treatment:    Reason Eval/Treat Not Completed: Medical issues which prohibited therapy; Pt with elevated troponin, BP decr to 92/52 this am, and with new onset afib                                 this am as well; will defer at this time and attempt again as schedule permits;   [facility] 01/21/2015, 9:48 AM

## 2015-01-22 ENCOUNTER — Other Ambulatory Visit: Payer: Self-pay | Admitting: Oncology

## 2015-01-22 LAB — BASIC METABOLIC PANEL
Anion gap: 10 (ref 5–15)
BUN: 27 mg/dL — AB (ref 6–23)
CHLORIDE: 90 mmol/L — AB (ref 96–112)
CO2: 30 mmol/L (ref 19–32)
CREATININE: 1.21 mg/dL (ref 0.50–1.35)
Calcium: 8.1 mg/dL — ABNORMAL LOW (ref 8.4–10.5)
GFR calc non Af Amer: 55 mL/min — ABNORMAL LOW (ref >90)
GFR, EST AFRICAN AMERICAN: 64 mL/min — AB (ref >90)
GLUCOSE: 281 mg/dL — AB (ref 70–99)
POTASSIUM: 4.2 mmol/L (ref 3.5–5.1)
Sodium: 130 mmol/L — ABNORMAL LOW (ref 135–145)

## 2015-01-22 LAB — GLUCOSE, CAPILLARY
GLUCOSE-CAPILLARY: 196 mg/dL — AB (ref 70–99)
Glucose-Capillary: 291 mg/dL — ABNORMAL HIGH (ref 70–99)

## 2015-01-22 MED ORDER — HEPARIN SOD (PORK) LOCK FLUSH 100 UNIT/ML IV SOLN
500.0000 [IU] | INTRAVENOUS | Status: AC | PRN
Start: 2015-01-22 — End: 2015-01-22
  Administered 2015-01-22: 500 [IU]

## 2015-01-22 MED ORDER — LEVOFLOXACIN 750 MG PO TABS
750.0000 mg | ORAL_TABLET | Freq: Once | ORAL | Status: AC
  Administered 2015-01-22: 750 mg via ORAL
  Filled 2015-01-22: qty 1

## 2015-01-22 MED ORDER — LEVOFLOXACIN 750 MG PO TABS: 750.0000 mg | ORAL_TABLET | Freq: Every day | ORAL | Status: AC

## 2015-01-22 NOTE — Discharge Summary (Signed)
Physician Discharge Summary  [name] [name] QIH:[id number] DOB: 02/24/1933 DOA: 01/19/2015  PCP: [name], MD  Admit date: 01/19/2015 Discharge date: 01/22/2015  Time spent: 55 minutes  Recommendations for Outpatient Follow-up:  1. Recommend DNR status- will allow his PCP and oncologist to discuss this with him 2. Assess need for Furosemide- I have held it for now due to hyponatremia and hypotension - see ECHO below 3. Going home with HHPT and RN- he lives alone-has a walker that "turns into a wheelchair"- family lives within walking distance and son, daughter and patient all in agreement with him returning home  Discharge Condition: stable Diet recommendation: diabetic diet  Discharge Diagnoses:  Principal Problem:   Acute respiratory failure with hypoxia Active Problems:   Hyponatremia   Squamous cell carcinoma lung   Diabetes mellitus type 2, uncontrolled, without complications   Pleural effusion on left   Systolic and diastolic CHF, acute on chronic   Hyponatremia with excess extracellular fluid volume   Anemia   History of present illness:  [name] [name] is a 81 y.o. [name] with a history of NSCLC (left upper lobe mass, currently undergoing treatment with Nivolumab), combined systolic and diastolic heart failure (last echo September 2015, EF 25-30% at that time), and persistent pleural effusion on left S/P Pleurx catheter placement in December 2015 (drained twice weekly by home health nurse). He presents with progressive shortness of breath. He tells me he is on 3 L O2 at home. He has a chronic cough with clear sputum which is unchanged from baseline.   Hospital Course:  Principal Problem: Dyspnea on exertion - on 3 L O2 at baseline- - no effusions noted on CT- may have a pneumonia but does not have new symptoms suggestive of it- he does have an elevated WBC count - on Cefepime with improvement noted on WBC count- has received 3 days, will transition to Levaquin for 5  more days  - has been diuresed for "slight increased vascularity on the right" seen on CXR and BP dropped to 80s therefore, cannot give further Lasix- I do not feel there is much heart failure - does not appear to be a COPD exacerbation as he is not wheezing- the patient does is not aware of a diagnosis of COPD and I see no notes from pulmonary in EPIC in regards to this- he is not on any inhalers at home that would suggest he has COPD - 3/26- per family(son and daughter at bedside) and per patient, his cough is chronic and there has not been an increase in cough or sputum production-  - if symptoms do not improve with completion of a course of antibiotics, dyspnea on exertion likely from deconditioning vs CAD (see below) -  have ordered PT eval- he will have home health PT ordered - I do not know his baseline pulse ox but he is requiring 6 L when ambulating- I have asked Home health to continue to follow pulse ox at home - he has a pleurex on the left- no effusions were noted on the CT - it its drained by HHRN2 x wk  and  is to be removed if he continues to drain less than 100 cc from it   Active Problems:  Elevated TNI- h/o CAD- medically managed - this may be the cause of his dyspnea on exertion - had a significant amount of CAD on cath in 04/10/14 Per Dr Croitoru's last note in 11/15- "He was found to have 70% ostial left  main and 80% distal left main stenosis and lengthy calcified 50% proximal LAD stenosis as well as extensive stenoses in the mid right and distal right coronary artery. Because of the complexity of disease and the lung cancer diagnosis, he was continued on medical therapy" - due to his lung cancer, I am doubtful cardiology would want to do any procedures- Hospice was recommended at that time- his children at his bedside are well aware of this already and neither they nor the patient want further work up  Paroxysmal A-fib - converted to A-fib on 3/26 and then back to SR- rate  was controlled although Metoprolol was not given  as BP was 78G systolic - patient is aware that this has happened in the past- (last year) - Cardiology note 11/15 mentions that anticoagulation was not indicated- he remains on a baby aspirin which I will continue- again, the plan was to focus on palliative care  Hyponatremia/ hypochloremia  - asymptomatic -  - Urine sodium ordered but still is not reported- Osm is low  - no pulm edema noted on CT chest on 3/25 - given Lasix IV x 4 doses- sodium was not improving with lasix but did improve after it was stopped- Lasix stopped as he appeared to be over diuresed with SBP in 80-90s -  suspect SIADH - diuretics on hold now- ECHO does not reveal significant heart failure- see below   Squamous cell carcinoma lung - Dr Ammie Dalton has evaluated the patient- he does not feel cancer is progressing as is mentioned on the CT report- he will continue treatment.   Diabetes mellitus type 2, uncontrolled, without complications -sugars quite high but patient states he is eating more than he dose at home- have resumed his home dose of insulin on d/c today  Procedures: ECHO 01/20/15  - Left ventricle: The cavity size was normal. Wall thickness was increased in a pattern of mild LVH. Systolic function was normal. The estimated ejection fraction was in the range of 55% to 60%. Doppler parameters are consistent with abnormal left ventricular relaxation (grade 1 diastolic dysfunction). - Aortic valve: AV is thickened, calcified with mildly restricted motion Peak and mean gradients through the valve are 25 and 12 mm Hg respectively consistent with mild aortic stenosis. - Mitral valve: There was mild regurgitation.   Consultations:  Oncology   Discharge Exam: Filed Weights   01/20/15 0446 01/21/15 0450 01/22/15 0443  Weight: 86.8 kg (191 lb 5.8 oz) 85.9 kg (189 lb 6 oz) 87.454 kg (192 lb 12.8 oz)   Filed Vitals:   01/22/15 0443  BP:  91/51  Pulse: 91  Temp: 97.5 F (36.4 C)  Resp: 17    General: AAO x 3, no distress Cardiovascular: RRR, no murmurs  Respiratory: clear to auscultation bilaterally GI: soft, non-tender, non-distended, bowel sound positive MSK: no edema of feet or legs, no cyanosis or clubbing  Discharge Instructions You were cared for by a hospitalist during your hospital stay. If you have any questions about your discharge medications or the care you received while you were in the hospital after you are discharged, you can call the unit and asked to speak with the hospitalist on call if the hospitalist that took care of you is not available. Once you are discharged, your primary care physician will handle any further medical issues. Please note that NO REFILLS for any discharge medications will be authorized once you are discharged, as it is imperative that you return to your primary care physician (  or establish a relationship with a primary care physician if you do not have one) for your aftercare needs so that they can reassess your need for medications and monitor your lab values.      Discharge Instructions    (HEART FAILURE PATIENTS) Call MD:  Anytime you have any of the following symptoms: 1) 3 pound weight gain in 24 hours or 5 pounds in 1 week 2) shortness of breath, with or without a dry hacking cough 3) swelling in the hands, feet or stomach 4) if you have to sleep on extra pillows at night in order to breathe.    Complete by:  As directed      Discharge instructions    Complete by:  As directed   3 L O2 at rest- increase to 6 L when walking- the home health nurse should check your oxygen level when she comes to check you - take a low carb diet     Increase activity slowly    Complete by:  As directed             Medication List    STOP taking these medications        furosemide 40 MG tablet  Commonly known as:  LASIX      TAKE these medications        amiodarone 200 MG tablet   Commonly known as:  PACERONE  Take 200 mg by mouth 2 (two) times daily.     aspirin 81 MG tablet  Take 81 mg by mouth 2 (two) times daily.     citalopram 20 MG tablet  Commonly known as:  CELEXA  Take 20 mg by mouth daily.     DSS 100 MG Caps  Take 100 mg by mouth 2 (two) times daily.     feeding supplement (GLUCERNA SHAKE) Liqd  Take 237 mLs by mouth daily.     finasteride 5 MG tablet  Commonly known as:  PROSCAR  Take 5 mg by mouth daily.     gabapentin 600 MG tablet  Commonly known as:  NEURONTIN  Take 600 mg by mouth 2 (two) times daily.     GLUCOSAMINE CHONDR 1500 COMPLX Caps  Take 1 capsule by mouth daily.     insulin aspart 100 UNIT/ML injection  Commonly known as:  novoLOG  Inject 5 Units into the skin 2 (two) times daily.     insulin glargine 100 UNIT/ML injection  Commonly known as:  LANTUS  Inject 0.18 mLs (18 Units total) into the skin at bedtime.     levofloxacin 750 MG tablet  Commonly known as:  LEVAQUIN  Take 1 tablet (750 mg total) by mouth daily.     linagliptin 5 MG Tabs tablet  Commonly known as:  TRADJENTA  Take 1 tablet (5 mg total) by mouth daily. With largest meal     metoprolol tartrate 25 MG tablet  Commonly known as:  LOPRESSOR  Take 12.5 mg by mouth daily.     oxyCODONE-acetaminophen 5-325 MG per tablet  Commonly known as:  PERCOCET/ROXICET  Take one tablet by mouth every 4 hours as needed for pain     pantoprazole 40 MG tablet  Commonly known as:  PROTONIX  Take 40 mg by mouth every evening.     Potassium Chloride ER 20 MEQ Tbcr  Take 40 mEq by mouth daily.     sucralfate 1 G tablet  Commonly known as:  CARAFATE  Take 1 tablet (1 g total)  by mouth 4 (four) times daily.     tamsulosin 0.4 MG Caps capsule  Commonly known as:  FLOMAX  Take 0.4 mg by mouth daily.     traMADol 50 MG tablet  Commonly known as:  ULTRAM  Take 1 tablet (50 mg total) by mouth every 12 (twelve) hours as needed.     TYLENOL ARTHRITIS PAIN 650 MG  CR tablet  Generic drug:  acetaminophen  Take 650 mg by mouth 2 (two) times daily as needed for pain (pain).       No Known Allergies Follow-up Information    Follow up with Crist Infante A, MD In 3 days.   Specialty:  Internal Medicine   Why:  you will need to see your family doctor by Tues or Wednesday to make sure you are improving   Contact information:   [place] [address] [phone]        The results of significant diagnostics from this hospitalization (including imaging, microbiology, ancillary and laboratory) are listed below for reference.    Significant Diagnostic Studies: Dg Chest 2 View (if Patient Has Fever And/or Copd)  01/19/2015   CLINICAL DATA:  History of lung carcinoma chronic left-sided chest pain  EXAM: CHEST  2 VIEW  COMPARISON:  01/09/2015  FINDINGS: Persistent opacification in the upper left chest is noted stable from the prior exam. A PleurX catheter is again noted on the left a pleural effusion is noted on the left slightly greater than that seen on the prior exam. A right chest wall port is noted. Some slight increased density is noted in the mid right lung which may represent some vascular congestion. Focal early infiltrate cannot be totally excluded.  IMPRESSION: Slight increase in the degree of left-sided pleural effusion.  Chronic changes on the left.  Slight increased vascularity on the right which may be related some vascular congestion.   Electronically Signed   By: Inez Catalina M.D.   On: 01/19/2015 20:21   Dg Chest 2 View  01/09/2015   CLINICAL DATA:  Non-Hodgkin's lymphoma, left upper lung neoplasm, shortness of breath  EXAM: CHEST  2 VIEW  COMPARISON:  Chest radiograph dated 12/26/2014. CT chest dated 09/10/2014.  FINDINGS: Stable opacification of the left upper hemithorax, favored to reflect a combination of tumor and loculated pleural effusion when correlating with prior CT, with indwelling pleural drain. Relative clearing in the  left mid/lower lung with associated small left basilar pleura fluid component.  Right lung is essentially clear, noting mild scarring in the right mid lung. No pneumothorax.  Right chest port terminates cavoatrial junction.  The heart is normal in size with mild leftward mediastinal shift.  IMPRESSION: Stable opacification of the left upper hemithorax, favored to reflect a combination of tumor and loculated pleural effusion when correlating with prior CT, with indwelling pleural drain.   Electronically Signed   By: Julian Hy M.D.   On: 01/09/2015 14:09   Dg Chest 2 View  12/26/2014   CLINICAL DATA:  Malignant neoplasm left upper lobe. Currently on chemotherapy. Follow-up mass and pleural effusion  EXAM: CHEST  2 VIEW  COMPARISON:  10/07/2014  FINDINGS: Progressive consolidation left upper lobe. This most likely is related to further collapse however could also represent tumor growth.  No significant change left pleural effusion. Two pleural drains are present on the left unchanged. No pneumothorax.  COPD. Scarring throughout the right lung. Port-A-Cath tip in the SVC.  IMPRESSION: Progressive consolidation left upper lobe which may  be due to collapse of the lung versus tumor growth.  Left effusion unchanged.  Two pleural drains on the left without pneumothorax.   Electronically Signed   By: Franchot Gallo M.D.   On: 12/26/2014 09:55   Ct Angio Chest Pe W/cm &/or Wo Cm  01/20/2015   CLINICAL DATA:  81 year old [name] with hypoxia and hypotension. Left lung cancer post radiation. Ongoing chemotherapy.  EXAM: CT ANGIOGRAPHY CHEST WITH CONTRAST  TECHNIQUE: Multidetector CT imaging of the chest was performed using the standard protocol during bolus administration of intravenous contrast. Multiplanar CT image reconstructions and MIPs were obtained to evaluate the vascular anatomy.  CONTRAST:  143mL OMNIPAQUE IOHEXOL 350 MG/ML SOLN  COMPARISON:  Chest radiograph 1 day prior. Most recent chest CT 09/10/2014   FINDINGS: There are no filling defects within the pulmonary arteries to suggest pulmonary embolus.  Left pleural drain, with tip at the lung apex. There is small left basilar pleural effusion with small adjacent foci of air. Left upper lobe paramediastinal mass like opacity measures 13.5 x 4.5 cm, increased from prior where it measured 4 x 10 cm. There is increasing soft tissue density at the left lung apex with scattered air bronchograms. Increasing soft tissue density in the left perihilar region. Minimal atelectasis adjacent to the left basilar pleural effusion.  The heart size is mildly enlarged. Coronary artery calcifications are seen. Small volume of pericardial fluid without pericardial nodularity. Thoracic aorta is normal in caliber with atherosclerosis.  New prevascular lymph node measuring 1.4 x 1.0 cm. Increased size lower paratracheal lymph node measuring 2.1 x 2.6 cm. Small right infrahilar lymph nodes noted. There is no right pleural effusion. Decreased interstitial opacity in the right lung from prior. Mild interstitial prominence in the lower lobe persists. Mild scarring in the right upper lobe. No right-sided pulmonary nodule.  Evaluation of the upper abdomen demonstrates no definite acute abnormality. No acute osseous abnormality. No focal osseous lesion. Right chest port remains in place.  Review of the MIP images confirms the above findings.  IMPRESSION: 1. No pulmonary embolus. 2. Increasing size of left upper lobe/paramediastinal masslike opacity with increasing soft tissue density at the apex and infrahilar region. In conjunction with new prevascular and pretracheal lymphadenopathy, findings are concerning for progression of disease. Alternatively, infection with reactive adenopathy is considered.   Electronically Signed   By: Jeb Levering M.D.   On: 01/20/2015 05:18    Microbiology: Recent Results (from the past 240 hour(s))  MRSA PCR Screening     Status: None   Collection Time:  01/20/15 12:31 AM  Result Value Ref Range Status   MRSA by PCR NEGATIVE NEGATIVE Final    Comment:        The GeneXpert MRSA Assay (FDA approved for NASAL specimens only), is one component of a comprehensive MRSA colonization surveillance program. It is not intended to diagnose MRSA infection nor to guide or monitor treatment for MRSA infections.      Labs: Basic Metabolic Panel:  Recent Labs Lab 01/20/15 0220 01/21/15 0915 01/21/15 1630 01/21/15 2027 01/22/15 0453  NA 129* 129* 129* 131* 130*  K 3.6 3.7 3.4* 4.2 4.2  CL 88* 89* 89* 90* 90*  CO2 29 28 31 30 30   GLUCOSE 359* 348* 201* 198* 281*  BUN 16 19 22  26* 27*  CREATININE 1.06 1.15 1.16 1.28 1.21  CALCIUM 8.1* 8.0* 7.5* 8.0* 8.1*   Liver Function Tests:  Recent Labs Lab 01/20/15 0220 01/21/15 2027  AST 14 22  ALT 9 12  ALKPHOS 72 71  BILITOT 0.9 0.7  PROT 6.4 6.7  ALBUMIN 2.3* 2.1*   No results for input(s): LIPASE, AMYLASE in the last 168 hours. No results for input(s): AMMONIA in the last 168 hours. CBC:  Recent Labs Lab 01/19/15 2017 01/20/15 0220 01/21/15 0915  WBC 14.4* 14.3* 12.5*  HGB 9.4* 9.1* 9.6*  HCT 29.7* 28.2* 30.6*  MCV 91.7 91.6 91.6  PLT 294 291 319   Cardiac Enzymes:  Recent Labs Lab 01/20/15 0220 01/20/15 0900 01/20/15 1440  TROPONINI 0.05* 0.09* 0.10*   BNP: BNP (last 3 results)  Recent Labs  01/19/15 2017 01/20/15 0220  BNP 641.3* 828.5*    ProBNP (last 3 results)  Recent Labs  08/18/14 1512 08/22/14 0535 09/10/14 1744  PROBNP 22975.0* 11824.0* 11604.0*    CBG:  Recent Labs Lab 01/21/15 0735 01/21/15 1213 01/21/15 1652 01/21/15 2204 01/22/15 0727  GLUCAP 253* 355* 190* 244* 291*       SignedDebbe Odea, MD [facility] 01/22/2015, 10:47 AM

## 2015-01-23 ENCOUNTER — Ambulatory Visit (HOSPITAL_BASED_OUTPATIENT_CLINIC_OR_DEPARTMENT_OTHER): Payer: Medicare Other

## 2015-01-23 ENCOUNTER — Other Ambulatory Visit: Payer: Medicare Other

## 2015-01-23 DIAGNOSIS — C3492 Malignant neoplasm of unspecified part of left bronchus or lung: Secondary | ICD-10-CM

## 2015-01-23 DIAGNOSIS — Z5112 Encounter for antineoplastic immunotherapy: Secondary | ICD-10-CM | POA: Diagnosis not present

## 2015-01-23 DIAGNOSIS — C3412 Malignant neoplasm of upper lobe, left bronchus or lung: Secondary | ICD-10-CM

## 2015-01-23 MED ORDER — SODIUM CHLORIDE 0.9 % IV SOLN
Freq: Once | INTRAVENOUS | Status: AC
  Administered 2015-01-23: 11:00:00 via INTRAVENOUS

## 2015-01-23 MED ORDER — SODIUM CHLORIDE 0.9 % IV SOLN
3.0000 mg/kg | Freq: Once | INTRAVENOUS | Status: AC
  Administered 2015-01-23: 260 mg via INTRAVENOUS
  Filled 2015-01-23: qty 26

## 2015-01-23 MED ORDER — HEPARIN SOD (PORK) LOCK FLUSH 100 UNIT/ML IV SOLN
500.0000 [IU] | Freq: Once | INTRAVENOUS | Status: AC | PRN
  Administered 2015-01-23: 500 [IU]
  Filled 2015-01-23: qty 5

## 2015-01-23 MED ORDER — SODIUM CHLORIDE 0.9 % IJ SOLN
10.0000 mL | INTRAMUSCULAR | Status: DC | PRN
  Administered 2015-01-23: 10 mL
  Filled 2015-01-23: qty 10

## 2015-01-23 NOTE — Patient Instructions (Signed)
[facility] Discharge Instructions for Patients Receiving Chemotherapy  Today you received the following chemotherapy agents: Nivolumab  To help prevent nausea and vomiting after your treatment, we encourage you to take your nausea medication as prescribed by your physician.    If you develop nausea and vomiting that is not controlled by your nausea medication, call the clinic.   BELOW ARE SYMPTOMS THAT SHOULD BE REPORTED IMMEDIATELY:  *FEVER GREATER THAN 100.5 F  *CHILLS WITH OR WITHOUT FEVER  NAUSEA AND VOMITING THAT IS NOT CONTROLLED WITH YOUR NAUSEA MEDICATION  *UNUSUAL SHORTNESS OF BREATH  *UNUSUAL BRUISING OR BLEEDING  TENDERNESS IN MOUTH AND THROAT WITH OR WITHOUT PRESENCE OF ULCERS  *URINARY PROBLEMS  *BOWEL PROBLEMS  UNUSUAL RASH Items with * indicate a potential emergency and should be followed up as soon as possible.  Feel free to call the clinic you have any questions or concerns. The clinic phone number is [phone].  Please show the CHEMO ALERT CARD at check-in to the Emergency Department and triage nurse.   

## 2015-01-23 NOTE — Progress Notes (Signed)
Pt here for chemo post hospitalization.  No labs ordered for today.  Dr. Benay Spice notified.  OK to procedd with chemo today without labs as per Dr. Benay Spice. Dr. Benay Spice notified of pt's low BP. No new orders received.

## 2015-01-26 ENCOUNTER — Telehealth: Payer: Self-pay | Admitting: *Deleted

## 2015-01-26 ENCOUNTER — Encounter (HOSPITAL_COMMUNITY): Payer: Self-pay | Admitting: Emergency Medicine

## 2015-01-26 ENCOUNTER — Emergency Department (HOSPITAL_COMMUNITY)
Admission: EM | Admit: 2015-01-26 | Discharge: 2015-01-27 | Disposition: E | Payer: Medicare Other | Attending: Emergency Medicine | Admitting: Emergency Medicine

## 2015-01-26 DIAGNOSIS — I252 Old myocardial infarction: Secondary | ICD-10-CM | POA: Insufficient documentation

## 2015-01-26 DIAGNOSIS — I2510 Atherosclerotic heart disease of native coronary artery without angina pectoris: Secondary | ICD-10-CM | POA: Diagnosis not present

## 2015-01-26 DIAGNOSIS — Z8572 Personal history of non-Hodgkin lymphomas: Secondary | ICD-10-CM | POA: Insufficient documentation

## 2015-01-26 DIAGNOSIS — E119 Type 2 diabetes mellitus without complications: Secondary | ICD-10-CM | POA: Diagnosis not present

## 2015-01-26 DIAGNOSIS — Z792 Long term (current) use of antibiotics: Secondary | ICD-10-CM | POA: Insufficient documentation

## 2015-01-26 DIAGNOSIS — Z87891 Personal history of nicotine dependence: Secondary | ICD-10-CM | POA: Diagnosis not present

## 2015-01-26 DIAGNOSIS — I10 Essential (primary) hypertension: Secondary | ICD-10-CM | POA: Insufficient documentation

## 2015-01-26 DIAGNOSIS — I469 Cardiac arrest, cause unspecified: Secondary | ICD-10-CM | POA: Diagnosis not present

## 2015-01-26 DIAGNOSIS — Z79899 Other long term (current) drug therapy: Secondary | ICD-10-CM | POA: Insufficient documentation

## 2015-01-26 DIAGNOSIS — Z7982 Long term (current) use of aspirin: Secondary | ICD-10-CM | POA: Diagnosis not present

## 2015-01-26 DIAGNOSIS — Z794 Long term (current) use of insulin: Secondary | ICD-10-CM | POA: Insufficient documentation

## 2015-01-26 DIAGNOSIS — Z85118 Personal history of other malignant neoplasm of bronchus and lung: Secondary | ICD-10-CM | POA: Insufficient documentation

## 2015-01-26 DIAGNOSIS — J449 Chronic obstructive pulmonary disease, unspecified: Secondary | ICD-10-CM | POA: Insufficient documentation

## 2015-01-26 LAB — I-STAT CHEM 8, ED
BUN: 43 mg/dL — ABNORMAL HIGH (ref 6–23)
CREATININE: 1.6 mg/dL — AB (ref 0.50–1.35)
Calcium, Ion: 1.12 mmol/L — ABNORMAL LOW (ref 1.13–1.30)
Chloride: 90 mmol/L — ABNORMAL LOW (ref 96–112)
Glucose, Bld: 614 mg/dL (ref 70–99)
HCT: 35 % — ABNORMAL LOW (ref 39.0–52.0)
Hemoglobin: 11.9 g/dL — ABNORMAL LOW (ref 13.0–17.0)
POTASSIUM: 5.6 mmol/L — AB (ref 3.5–5.1)
SODIUM: 125 mmol/L — AB (ref 135–145)
TCO2: 18 mmol/L (ref 0–100)

## 2015-01-26 LAB — CBG MONITORING, ED: Glucose-Capillary: 497 mg/dL — ABNORMAL HIGH (ref 70–99)

## 2015-01-26 LAB — I-STAT TROPONIN, ED: TROPONIN I, POC: 0.25 ng/mL — AB (ref 0.00–0.08)

## 2015-01-26 MED ORDER — ATROPINE SULFATE 1 MG/ML IJ SOLN
INTRAMUSCULAR | Status: AC | PRN
  Administered 2015-01-26: 1 mg via INTRAVENOUS

## 2015-01-26 MED ORDER — EPINEPHRINE HCL 0.1 MG/ML IJ SOSY
PREFILLED_SYRINGE | INTRAMUSCULAR | Status: AC | PRN
  Administered 2015-01-26 (×4): 1 via INTRAVENOUS

## 2015-01-26 MED ORDER — SODIUM BICARBONATE 8.4 % IV SOLN
INTRAVENOUS | Status: AC | PRN
  Administered 2015-01-26 (×2): 50 meq via INTRAVENOUS

## 2015-01-26 MED ORDER — AMIODARONE HCL 150 MG/3ML IV SOLN
150.0000 mg | INTRAVENOUS | Status: AC | PRN
  Administered 2015-01-26: 150 mg via INTRAVENOUS

## 2015-01-26 MED ORDER — CALCIUM CHLORIDE 10 % IV SOLN
INTRAVENOUS | Status: AC | PRN
  Administered 2015-01-26 (×2): 1 g via INTRAVENOUS

## 2015-01-26 MED FILL — Medication: Qty: 1 | Status: AC

## 2015-01-27 NOTE — Code Documentation (Signed)
Family arrived and secretary placed pt in consultation room. MD will speak with family.

## 2015-01-27 NOTE — Code Documentation (Signed)
Pt pulseless. Time of death 1130

## 2015-01-27 NOTE — ED Notes (Signed)
EMS reports they were called out for high CBG of 598. Pt also in resp distress, sats 84% on home O2. EMS placed pt on nonrebreather, sats high 90's. Pt alert and oriented. NKDA. Hx lung cancer, CAD. BP 37'V systolic for EMS. EMS gave 1 mg atropine. EMS reports pt alert during transport

## 2015-01-27 NOTE — Code Documentation (Signed)
Pulse ccheck vfib shock 200

## 2015-01-27 NOTE — Code Documentation (Signed)
Successfully intubated

## 2015-01-27 NOTE — Code Documentation (Signed)
Pulseless V Fib shock at 200

## 2015-01-27 NOTE — Code Documentation (Addendum)
Pulse check. MD felt very faint pulse. Pacing will be initiated

## 2015-01-27 NOTE — Progress Notes (Addendum)
Chaplain Note:  Chaplain spoke with pt son, Mr. Burkitt.   He is awaiting for more of his family to arrive. He and his nephew are both present. Nephew in the lobby awaiting the arrival of more family members within 20-25 min. Once members are gathered he mentioned that the doctor will come in and speak with them.   Will get further information when the needed family members arrive to have conversation about arrangements.   Chaplain gave son Nicole [facility] [employer].   He mentioned that they know it will be  [address]  [place], [place] [address] [phone]  Sherrilee Gilles 02-23-15 11:52 AM

## 2015-01-27 NOTE — Telephone Encounter (Signed)
Received a second call from [facility] with [facility].  [name] expired after he arrived at [facility].

## 2015-01-27 NOTE — Code Documentation (Signed)
Pulse check. Pulsesless,  unshockable rhythm continue CPR

## 2015-01-27 NOTE — ED Notes (Signed)
Pt unresponsive upon arrival. Unable to fel pulse CPR initiated at 1110

## 2015-01-27 NOTE — Code Documentation (Signed)
Pulse check, asystole. No pulse

## 2015-01-27 NOTE — Telephone Encounter (Signed)
Heather with [facility] called to say that when the physical therapist arrived at patient's house today he could not get up.  She called EMS, his blood glucose was assessed and found to be 534 mg/dL.  He was taken to [facility] by EMS.

## 2015-01-27 NOTE — Code Documentation (Signed)
CPR continuing

## 2015-01-27 NOTE — Code Documentation (Signed)
MD ultrasound heart, some movement noted. Continue CPR

## 2015-01-27 NOTE — ED Notes (Signed)
Pt transported to [facility]

## 2015-01-27 NOTE — Progress Notes (Signed)
Responded to referral from chaplain colleague to continue support to family of [name] . Christean Leaf had gotten all necessary information needed for staff. Family requested prayer before their departure. I Prayed with family and provided ministry of presence and information sharing.

## 2015-01-27 NOTE — Code Documentation (Signed)
Atropine given by RN

## 2015-01-27 NOTE — Code Documentation (Signed)
Unable to capture with pacing pads. No pulse

## 2015-01-27 NOTE — Code Documentation (Signed)
CPR initiated 1110

## 2015-01-27 NOTE — ED Provider Notes (Signed)
CSN: [id number]     Arrival date & time 02-01-2015  1106 History   First MD Initiated Contact with Patient 02-01-2015 1141     Chief Complaint  Patient presents with  . Cardiac Arrest     (Consider location/radiation/quality/duration/timing/severity/associated sxs/prior Treatment) Patient is a 81 y.o. male presenting with general illness. The history is provided by the EMS personnel.  Illness Location:  Home Quality:  Diaphoresis, SOB, hyperglyceima Severity:  Severe Onset quality:  Sudden Timing:  Constant Progression:  Worsening Chronicity:  New Context:  Was at home, became diaphoretic, bradycardic. Had hyperglycemia per son Relieved by:  Nothing Worsened by:  Nothing   Past Medical History  Diagnosis Date  . Peripheral arterial disease     s/p Ao Bifem Bypass  . Diabetes mellitus without complication   . CAD (coronary artery disease)     By  CT Scan  . Hyperlipidemia   . Hypertension   . Peripheral neuropathy   . COPD (chronic obstructive pulmonary disease)   . History of lower GI bleeding   . Substance abuse   . AAA (abdominal aortic aneurysm)   . MI (myocardial infarction) 04/10/14  . Non Hodgkin's lymphoma 2000, 2010  . Lung cancer, upper lobe     left lung with left hilar lymphadenopathy; S/P radiation; S/P Taxol and carboplatin therapy; currently receiving Nivolumab  . CHF (congestive heart failure)     Combined systolic and diastolic, EF 97-02% with grade II diastolic dysfunction   Past Surgical History  Procedure Laterality Date  . Fracture surgery  2009    Left ankle  . Pr vein bypass graft,aorto-fem-pop  2005    Right common femoral to BK popliteal BPG  . Pr vein bypass graft,aorto-fem-pop  01-27-05    Revision of Right fem-pop BPG  . Cardiac catheterization  1990's  . Video bronchoscopy Bilateral 03/14/2014    Procedure: VIDEO BRONCHOSCOPY WITH FLUORO;  Surgeon: Rigoberto Noel, MD;  Location: [facility];  Service: Cardiopulmonary;  Laterality: Bilateral;   . Nm myoview ltd  11/2013    Lexiscan.  EF 60%, small fixed inferoapical defect - ? artifact  . Transthoracic echocardiogram  11/2013    EF 60-65%, Gr 1 DD, Mod AS, Sever MAC - no MS.     Marland Kitchen Left heart catheterization with coronary angiogram N/A 04/10/2014    Procedure: LEFT HEART CATHETERIZATION WITH CORONARY ANGIOGRAM;  Surgeon: Leonie Man, MD;  Location: [facility] CATH LAB;  Service: Cardiovascular;  Laterality: N/A;  . Port in right chest Right   . Pleurx catheter on left Left    Family History  Problem Relation Age of Onset  . Cancer Mother     Colon  . Heart attack Mother   . Stroke Father   . Hyperlipidemia Father   . Hyperlipidemia Sister   . Hyperlipidemia Brother   . Hyperlipidemia Daughter   . Hypertension Son    History  Substance Use Topics  . Smoking status: Former Smoker -- 1.00 packs/day for 50 years    Types: Cigarettes    Quit date: 10/29/2003  . Smokeless tobacco: Never Used  . Alcohol Use: No    Review of Systems  Unable to perform ROS: Intubated      Allergies  Review of patient's allergies indicates no known allergies.  Home Medications   Prior to Admission medications   Medication Sig Start Date End Date Taking? Authorizing Provider  acetaminophen (TYLENOL ARTHRITIS PAIN) 650 MG CR tablet Take 650 mg by mouth 2 (  two) times daily as needed for pain (pain).     Historical Provider, MD  amiodarone (PACERONE) 200 MG tablet Take 200 mg by mouth 2 (two) times daily.    Historical Provider, MD  aspirin 81 MG tablet Take 81 mg by mouth 2 (two) times daily.     Historical Provider, MD  citalopram (CELEXA) 20 MG tablet Take 20 mg by mouth daily.    Historical Provider, MD  docusate sodium 100 MG CAPS Take 100 mg by mouth 2 (two) times daily. 09/12/14   Erline Hau, MD  feeding supplement, GLUCERNA SHAKE, (GLUCERNA SHAKE) LIQD Take 237 mLs by mouth daily. 08/23/14   Barton Dubois, MD  finasteride (PROSCAR) 5 MG tablet Take 5 mg by mouth daily.     Historical Provider, MD  gabapentin (NEURONTIN) 600 MG tablet Take 600 mg by mouth 2 (two) times daily.     Historical Provider, MD  Glucosamine-Chondroit-Vit C-Mn (GLUCOSAMINE CHONDR 1500 COMPLX) CAPS Take 1 capsule by mouth daily.    Historical Provider, MD  insulin aspart (NOVOLOG) 100 UNIT/ML injection Inject 5 Units into the skin 2 (two) times daily.    Historical Provider, MD  insulin glargine (LANTUS) 100 UNIT/ML injection Inject 0.18 mLs (18 Units total) into the skin at bedtime. Patient taking differently: Inject 10 Units into the skin at bedtime.  08/23/14   Barton Dubois, MD  levofloxacin (LEVAQUIN) 750 MG tablet Take 1 tablet (750 mg total) by mouth daily. 01/22/15   Debbe Odea, MD  linagliptin (TRADJENTA) 5 MG TABS tablet Take 1 tablet (5 mg total) by mouth daily. With largest meal 07/12/14   Tiffany L Reed, DO  metoprolol tartrate (LOPRESSOR) 25 MG tablet Take 12.5 mg by mouth daily. 06/02/14   Mihai Croitoru, MD  oxyCODONE-acetaminophen (PERCOCET/ROXICET) 5-325 MG per tablet Take one tablet by mouth every 4 hours as needed for pain 06/30/14   Tiffany L Reed, DO  pantoprazole (PROTONIX) 40 MG tablet Take 40 mg by mouth every evening.    Historical Provider, MD  potassium chloride 20 MEQ TBCR Take 40 mEq by mouth daily. 07/12/14   Tiffany L Reed, DO  sucralfate (CARAFATE) 1 G tablet Take 1 tablet (1 g total) by mouth 4 (four) times daily. 05/14/14   Linton Flemings, MD  Tamsulosin HCl (FLOMAX) 0.4 MG CAPS Take 0.4 mg by mouth daily.    Historical Provider, MD  traMADol (ULTRAM) 50 MG tablet Take 1 tablet (50 mg total) by mouth every 12 (twelve) hours as needed. 01/09/15   Ladell Pier, MD   BP  Physical Exam  Constitutional: He appears well-developed and well-nourished. He appears distressed.  HENT:  Head: Normocephalic and atraumatic.  Mouth/Throat: No oropharyngeal exudate.  Eyes:  Fixed, non-reactive  Neck: Normal range of motion. Neck supple.  Cardiovascular: An irregular rhythm  present. Bradycardia present.  Exam reveals no friction rub.   No murmur heard. No pulse  Pulmonary/Chest: He is in respiratory distress (Agonal).  Abdominal: He exhibits no distension. There is no tenderness. There is no rebound.  Musculoskeletal: He exhibits no edema.  Neurological: He is unresponsive. GCS eye subscore is 1. GCS verbal subscore is 1. GCS motor subscore is 1.  Skin: He is not diaphoretic.  Nursing note and vitals reviewed.   ED Course  INTUBATION Date/Time: 02/23/2015 11:43 AM Performed by: Evelina Bucy Authorized by: Evelina Bucy Consent: The procedure was performed in an emergent situation. Indications: respiratory distress,  respiratory failure,  hypoxemia and  airway protection  Intubation method: direct Patient status: unconscious Laryngoscope size: Mac 3 Tube size: 7.5 mm Tube type: cuffed Number of attempts: 1 Cricoid pressure: no Cords visualized: yes  CARDIOVERSION Date/Time: 02-06-15 11:55 AM Performed by: Evelina Bucy Authorized by: Evelina Bucy Consent: The procedure was performed in an emergent situation. Patient sedated: no Cardioversion basis: emergent Pre-procedure rhythm: ventricular fibrillation Patient position: patient was placed in a supine position Electrodes: pads Electrodes placed: anterior-posterior Number of attempts: 2 Attempt 1 mode: asynchronous Attempt 1 waveform: biphasic Attempt 1 shock (in Joules): 200 Attempt 1 outcome: no change in rhythm Attempt 2 mode: asynchronous Attempt 2 waveform: biphasic Attempt 2 shock (in Joules): 200 Attempt 2 outcome: no change in rhythm   (including critical care time) Labs Review Labs Reviewed  I-STAT CHEM 8, ED - Abnormal; Notable for the following:    Sodium 125 (*)    Potassium 5.6 (*)    Chloride 90 (*)    BUN 43 (*)    Creatinine, Ser 1.60 (*)    Glucose, Bld 614 (*)    Calcium, Ion 1.12 (*)    Hemoglobin 11.9 (*)    HCT 35.0 (*)    All other components within  normal limits  I-STAT TROPOININ, ED - Abnormal; Notable for the following:    Troponin i, poc 0.25 (*)    All other components within normal limits    Imaging Review No results found.   EKG Interpretation None      Cardiopulmonary Resuscitation (CPR) Procedure Note Directed/Performed by: Osvaldo Shipper I personally directed ancillary staff and/or performed CPR in an effort to regain return of spontaneous circulation and to maintain cardiac, neuro and systemic perfusion.   MDM   Final diagnoses:  Cardiac arrest    81 year old male here for respirator distress, hyperglycemia. At home was hyperglycemic. He was also diaphoretic. EMS noted low sats, started on 15 L nonrebreather. Upon arrival he was pulseless. CPR initiated, continued for 20 minutes with multiples round of epinephrine, bicarb, atropine, calcium. Patient had 2 defibrillation at 200J for Vfib appearance on the monitor. After 20 minutes of resuscitation, patient still with no pulse. Time of death, 35.    Evelina Bucy, MD 02-06-15 1155

## 2015-01-27 NOTE — Code Documentation (Signed)
Pt unresponsive, cold upon arrival. MD at bedside. Feeling for pulse.

## 2015-01-27 DEATH — deceased

## 2015-02-06 ENCOUNTER — Ambulatory Visit: Payer: Medicare Other

## 2015-02-06 ENCOUNTER — Other Ambulatory Visit: Payer: Medicare Other

## 2015-02-06 ENCOUNTER — Ambulatory Visit: Payer: Medicare Other | Admitting: Oncology

## 2015-12-23 IMAGING — CT CT HEAD W/O CM
2 series · 16 of 30 positions shown, 18 images · non-contrast
Comparison: CT PET scan 02/20/2009

CLINICAL DATA: Fall, scalp hematoma

EXAM:
CT HEAD WITHOUT CONTRAST
TECHNIQUE: Contiguous axial images were obtained from the base of the skull
through the vertex without intravenous contrast.

[Series 201: head w/o, idose (1) · axial · non-contrast · 0.49mm/px · z∈[-136,-16]mm · 8 of 32 slices shown, 10 images]
[im 4/32  brain]
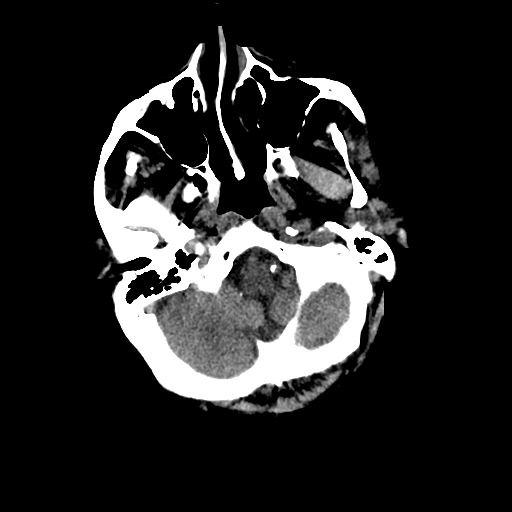
[im 4/32  bone]
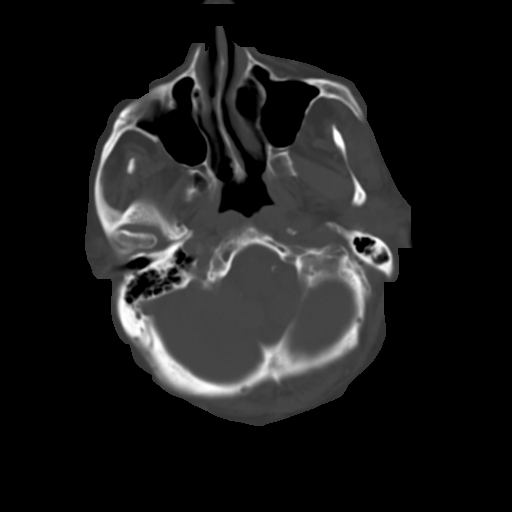
[im 7/32  brain]
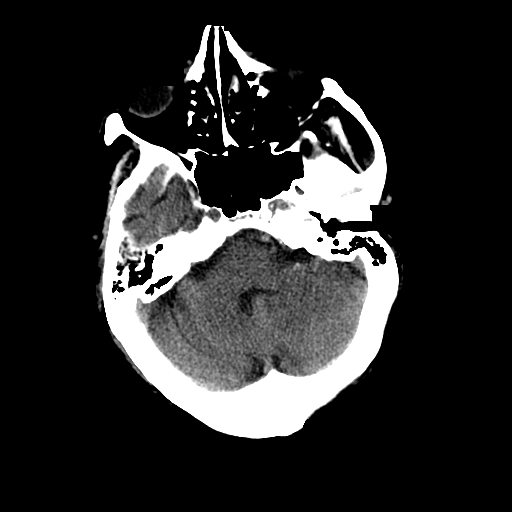
[im 11/32  brain]
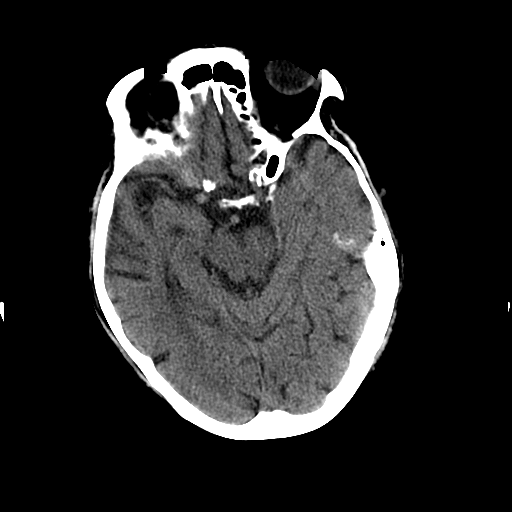
[im 14/32  brain]
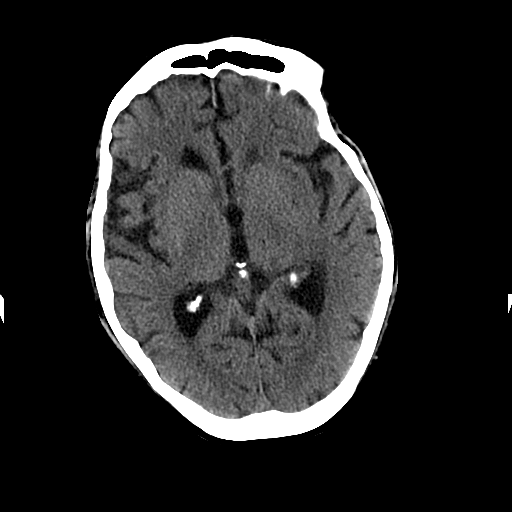
[im 18/32  brain]
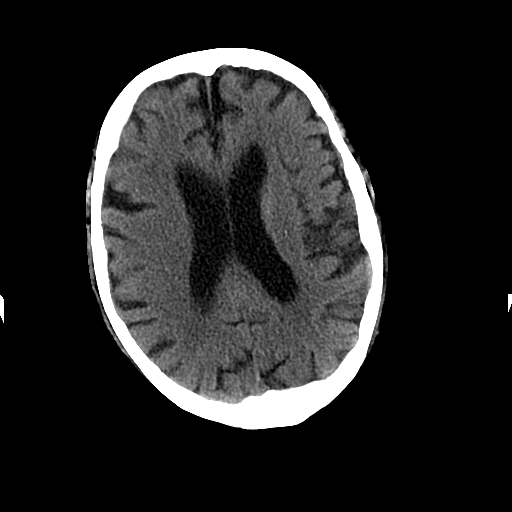
[im 18/32  bone]
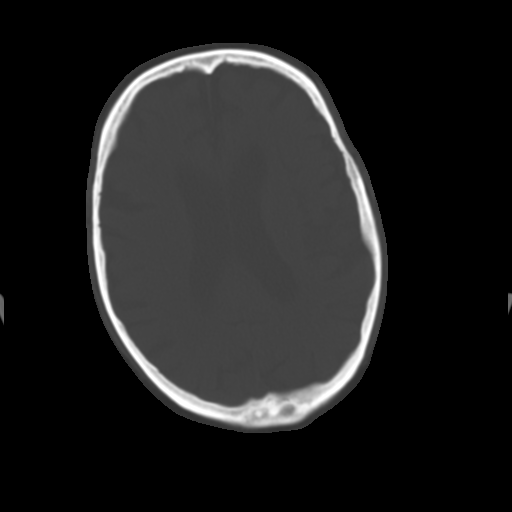
[im 21/32  brain]
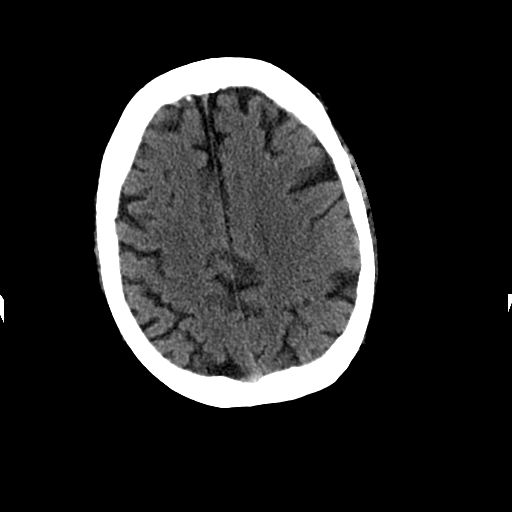
[im 25/32  brain]
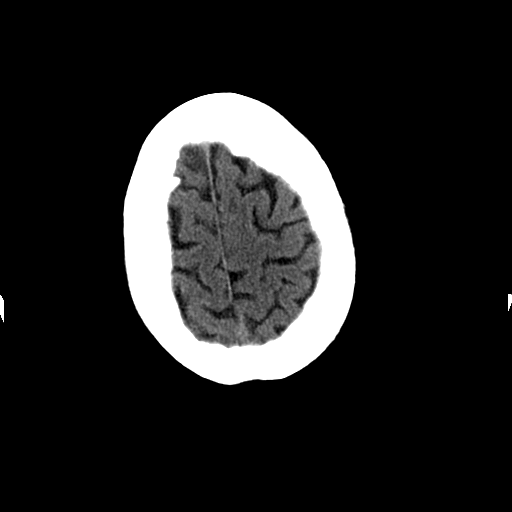
[im 28/32  brain]
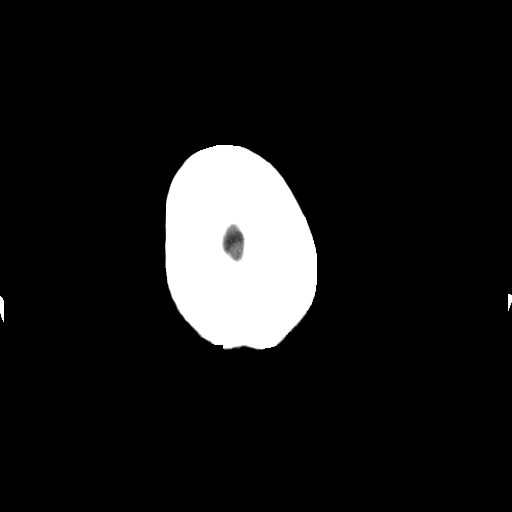

[Series 202: head w/o bone, idose (1) · axial · non-contrast · 0.49mm/px · z∈[-137,-12]mm · 8 of 64 slices shown]
[im 7/64  bone]
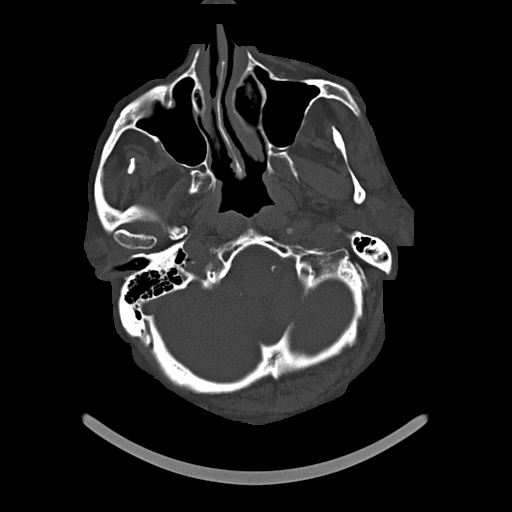
[im 14/64  bone]
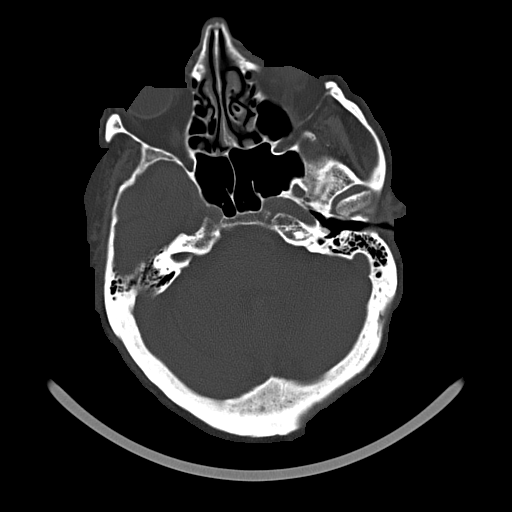
[im 20/64  bone]
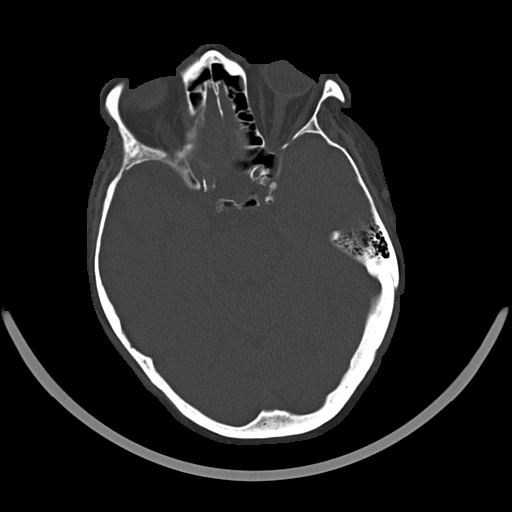
[im 27/64  bone]
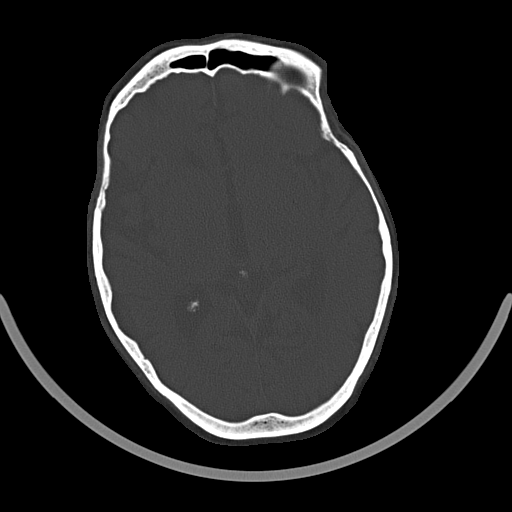
[im 37/64  bone]
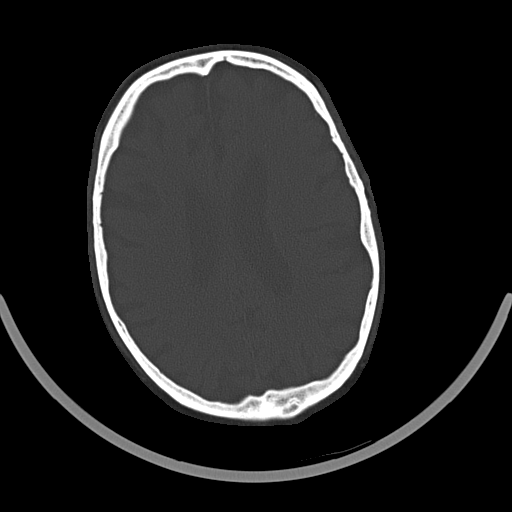
[im 44/64  bone]
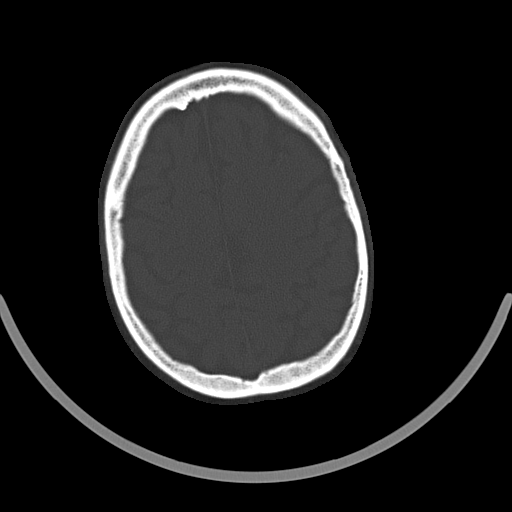
[im 50/64  bone]
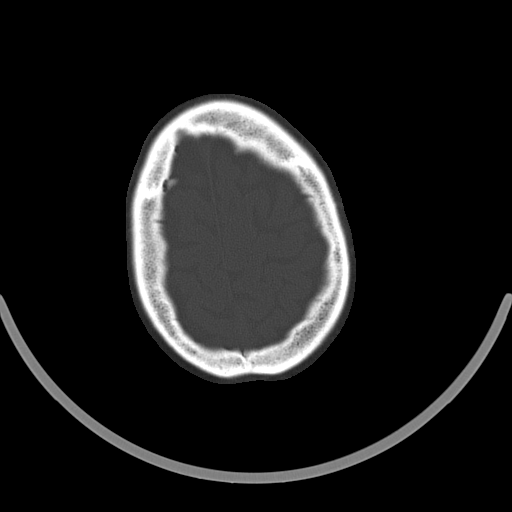
[im 57/64  bone]
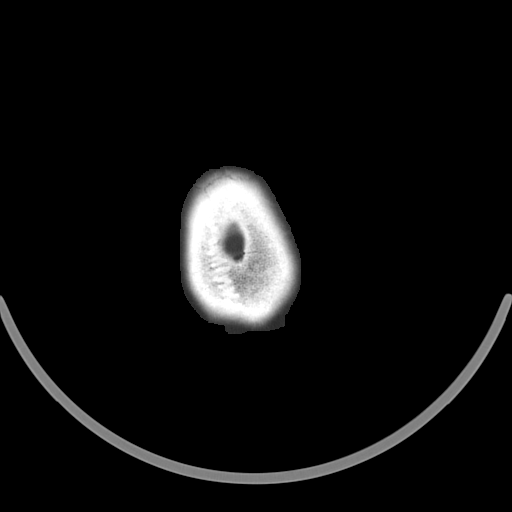

[16 of 30 positions shown; findings below may reference images not displayed]

FINDINGS: No skull fracture is noted. Paranasal sinuses and mastoid air cells
are unremarkable. Mild cerebral atrophy again noted. Atherosclerotic
calcifications of carotid siphon. No intracranial hemorrhage, mass
effect or midline shift. No acute cortical infarction. No mass
lesion is noted on this unenhanced scan.
IMPRESSION: No acute intracranial abnormality.  Mild cerebral atrophy.

## 2016-04-12 ENCOUNTER — Other Ambulatory Visit: Payer: Self-pay | Admitting: Nurse Practitioner

## 2016-04-28 IMAGING — CR DG CHEST 2V
2 series · 2 of 2 positions shown · non-contrast
Comparison: July 23, 2014 chest radiograph and chest CT
July 11, 2004

CLINICAL DATA: Difficulty breathing; history of lung carcinoma

EXAM:
CHEST  2 VIEW

[w chest pa]
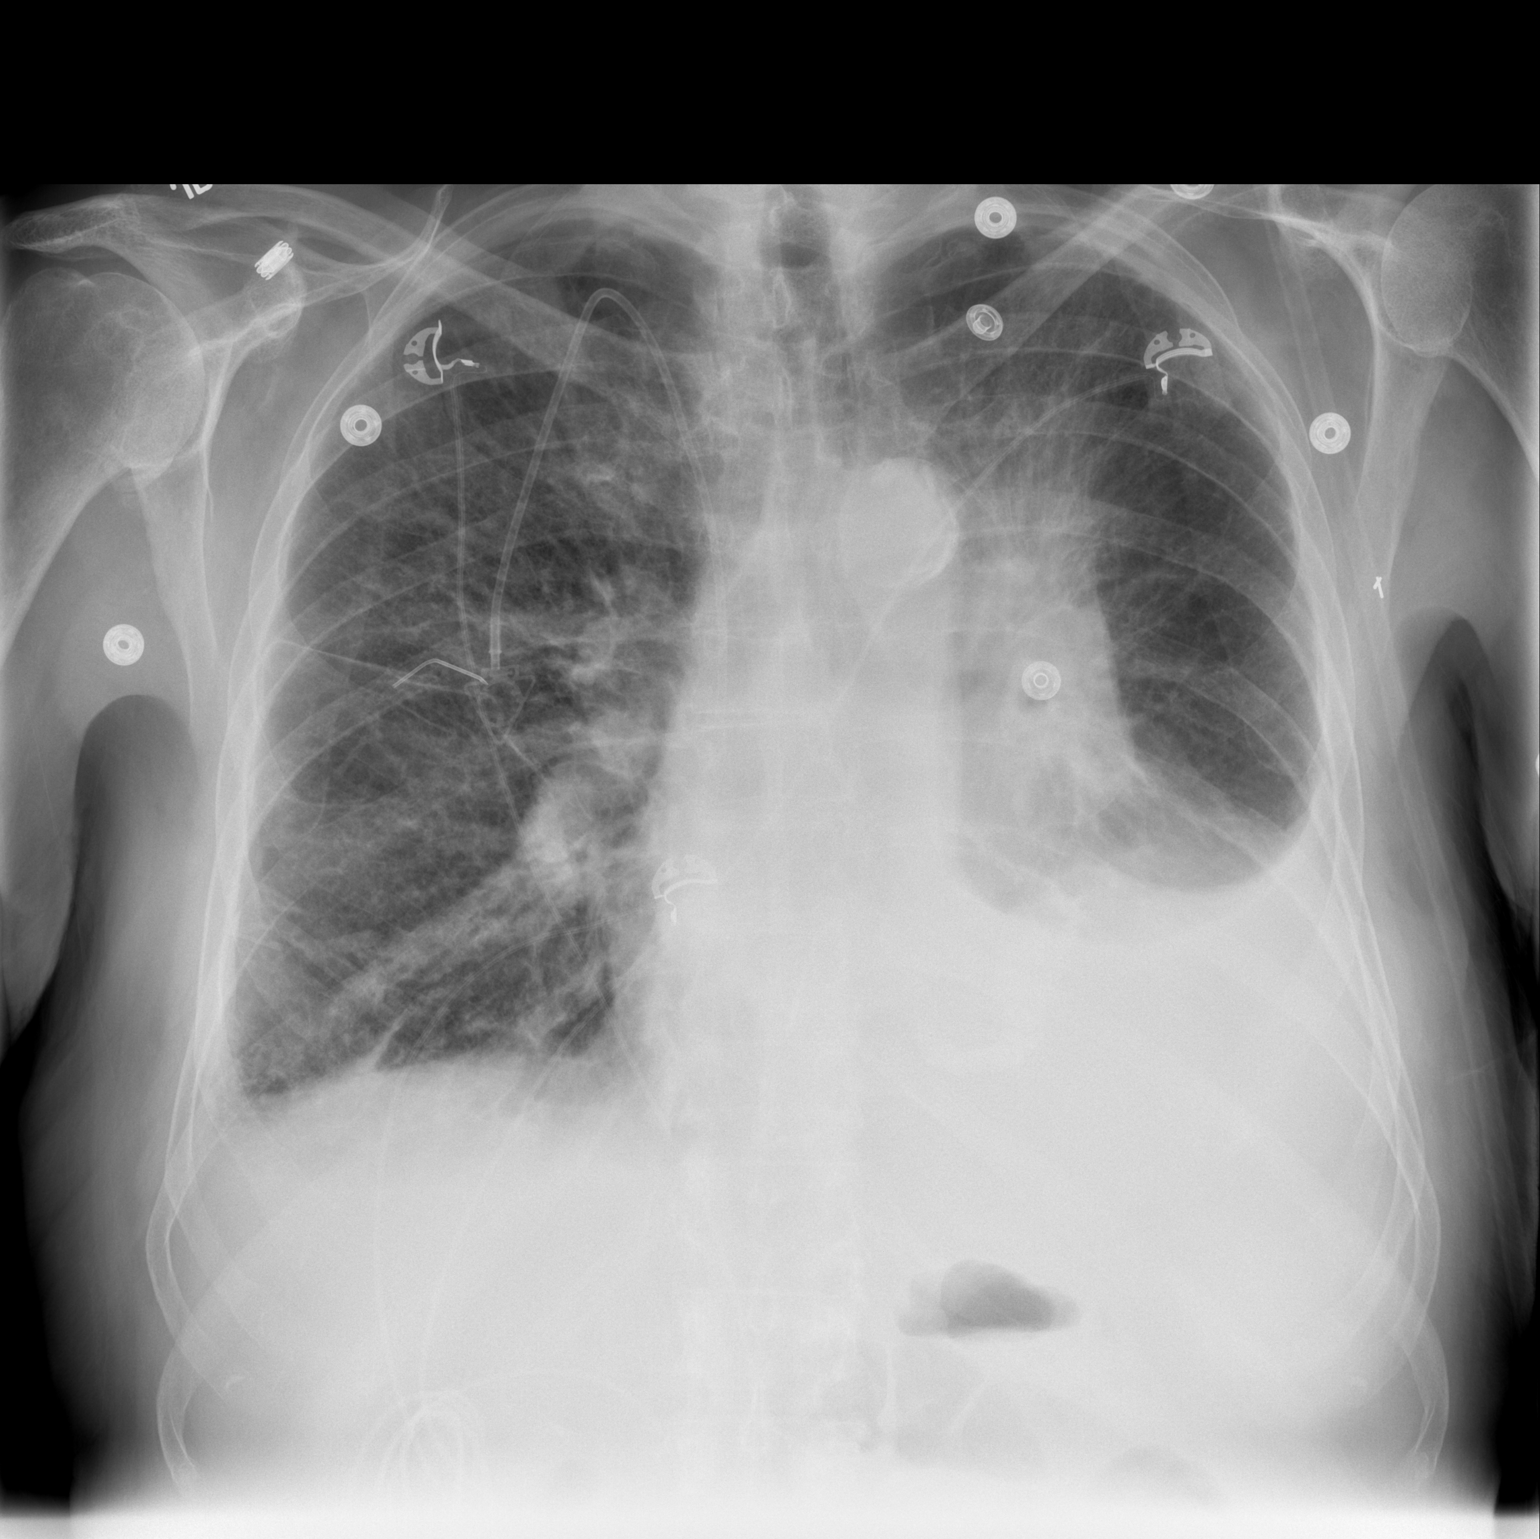

[w chest lat]
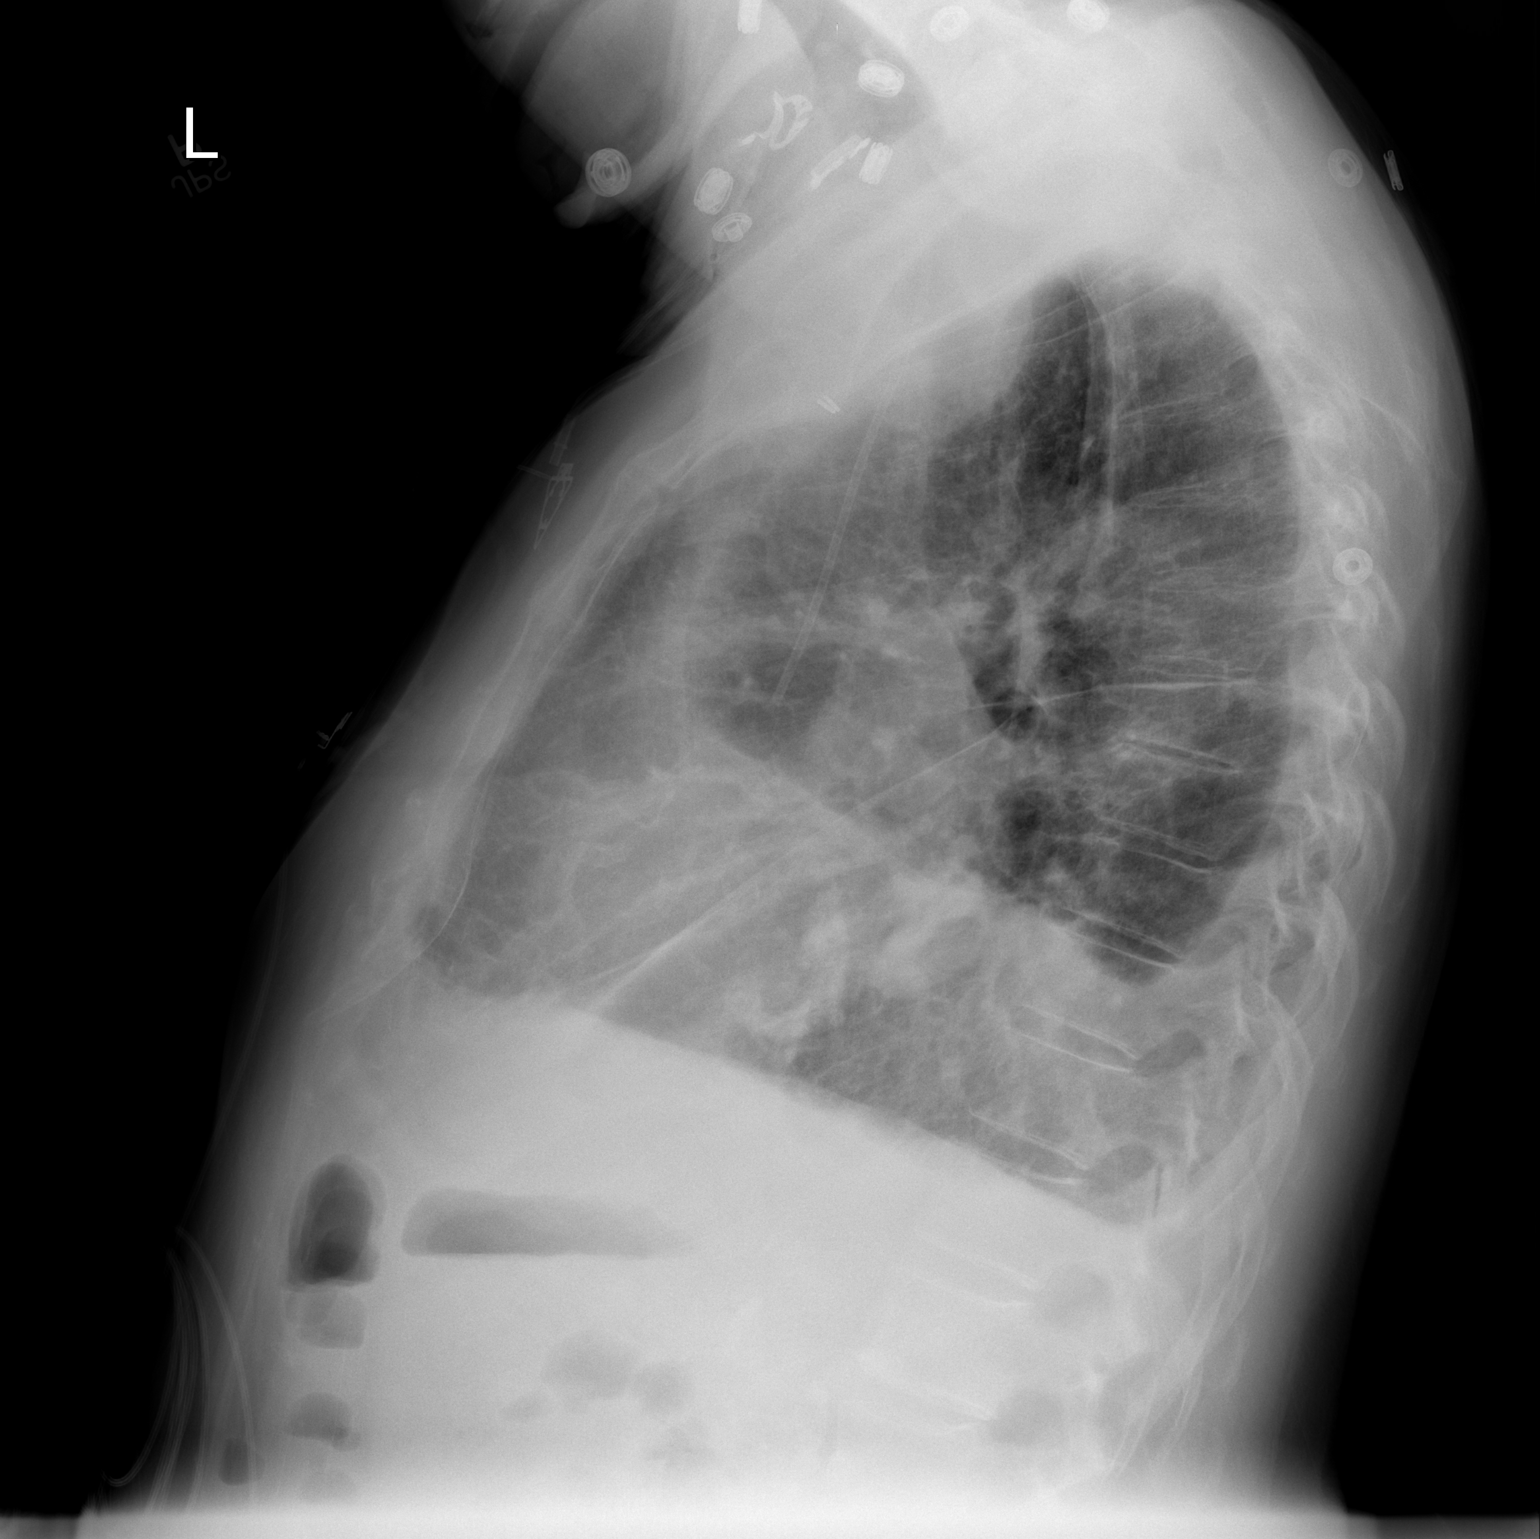

[2 of 2 positions shown; findings below may reference images not displayed]

FINDINGS: Port-A-Cath tip is in the superior vena cava. No pneumothorax. The
previously noted left upper lobe mass with volume loss persists
without appreciable change. There is a moderate effusion on the
left. Elsewhere there is generalized interstitial edema. Masses in
the right lower lobe seen on recent CT are not well seen by
radiography. The heart size is normal. Pulmonary vascularity is
within normal limits. There is atherosclerotic change in the aorta.
No bone lesions.
IMPRESSION: Persistent left effusion and generalized interstitial edema. Left
upper lobe mass with volume loss persists. Masses in the right lower
lobe seen on recent CT are not well seen by radiography. No change
in cardiac silhouette. No pneumothorax.
